# Patient Record
Sex: Male | Born: 1955 | Hispanic: Yes | Marital: Single | Smoking: Never smoker
Health system: Southern US, Community
[De-identification: ages and names within clinical notes are randomized; demographics above are authoritative.]

## PROBLEM LIST (undated history)

## (undated) DIAGNOSIS — I469 Cardiac arrest, cause unspecified: Secondary | ICD-10-CM

## (undated) DIAGNOSIS — Z955 Presence of coronary angioplasty implant and graft: Secondary | ICD-10-CM

## (undated) DIAGNOSIS — I509 Heart failure, unspecified: Secondary | ICD-10-CM

## (undated) DIAGNOSIS — E119 Type 2 diabetes mellitus without complications: Secondary | ICD-10-CM

## (undated) DIAGNOSIS — I2510 Atherosclerotic heart disease of native coronary artery without angina pectoris: Secondary | ICD-10-CM

## (undated) HISTORY — DX: Presence of coronary angioplasty implant and graft: Z95.5

## (undated) HISTORY — PX: CARDIAC CATHETERIZATION: SHX172

## (undated) HISTORY — DX: Type 2 diabetes mellitus without complications: E11.9

## (undated) HISTORY — DX: Cardiac arrest, cause unspecified: I46.9

## (undated) HISTORY — DX: Atherosclerotic heart disease of native coronary artery without angina pectoris: I25.10

## (undated) HISTORY — DX: Heart failure, unspecified: I50.9

## (undated) HISTORY — PX: ICD IMPLANT: EP1208

---

## 2021-06-24 ENCOUNTER — Other Ambulatory Visit: Payer: Self-pay | Admitting: Critical Care Medicine

## 2021-06-24 ENCOUNTER — Other Ambulatory Visit (HOSPITAL_COMMUNITY): Payer: Self-pay

## 2021-06-24 MED ORDER — METFORMIN HCL 500 MG PO TABS
500.0000 mg | ORAL_TABLET | Freq: Every day | ORAL | 0 refills | Status: DC
  Filled 2021-06-24: qty 30, 30d supply, fill #0

## 2021-06-24 MED ORDER — ATORVASTATIN CALCIUM 80 MG PO TABS
80.0000 mg | ORAL_TABLET | Freq: Every day | ORAL | 0 refills | Status: DC
  Filled 2021-06-24: qty 30, 30d supply, fill #0

## 2021-06-24 NOTE — Progress Notes (Signed)
One month refill sent.

## 2021-06-24 NOTE — Congregational Nurse Program (Signed)
  Dept: [phone]   Congregational Nurse Program Note  Date of Encounter: 06/24/2021  Past Medical History: No past medical history on file.  Encounter Details:  CNP Questionnaire - 06/24/21 1506       Questionnaire   Do you give verbal consent to treat you today? Yes    Visit Setting Church or Organization    Location Patient Served At [employer]    Patient Status Immigrant    Medical Provider No    Insurance Uninsured (Includes Orange Card/[facility] [place])    Intervention Refer;Assess (including screenings)    Transportation Need transportation assistance    Medication Have medication insecurities    Referrals PCP - [place]            Assisted patient to set up an appt at [facility] to establish care. Appt on oct 4th at 1:30pm. Patient needed a refill on two medications given at [facility] on 05/22/21. Patient ran out of atorvastatin 80 mg once a day. And Metformin 500 mg once a day.

## 2021-06-25 ENCOUNTER — Other Ambulatory Visit (HOSPITAL_COMMUNITY): Payer: Self-pay

## 2021-07-22 ENCOUNTER — Ambulatory Visit (INDEPENDENT_AMBULATORY_CARE_PROVIDER_SITE_OTHER): Payer: Self-pay | Admitting: Primary Care

## 2021-07-22 ENCOUNTER — Other Ambulatory Visit: Payer: Self-pay

## 2021-07-22 ENCOUNTER — Encounter (INDEPENDENT_AMBULATORY_CARE_PROVIDER_SITE_OTHER): Payer: Self-pay | Admitting: Primary Care

## 2021-07-22 VITALS — BP 147/85 | HR 60 | Temp 97.7°F | Wt 143.6 lb

## 2021-07-22 DIAGNOSIS — I10 Essential (primary) hypertension: Secondary | ICD-10-CM

## 2021-07-22 DIAGNOSIS — Z23 Encounter for immunization: Secondary | ICD-10-CM

## 2021-07-22 DIAGNOSIS — E1169 Type 2 diabetes mellitus with other specified complication: Secondary | ICD-10-CM | POA: Insufficient documentation

## 2021-07-22 DIAGNOSIS — I5042 Chronic combined systolic (congestive) and diastolic (congestive) heart failure: Secondary | ICD-10-CM | POA: Insufficient documentation

## 2021-07-22 DIAGNOSIS — E1159 Type 2 diabetes mellitus with other circulatory complications: Secondary | ICD-10-CM | POA: Insufficient documentation

## 2021-07-22 DIAGNOSIS — E119 Type 2 diabetes mellitus without complications: Secondary | ICD-10-CM

## 2021-07-22 DIAGNOSIS — E876 Hypokalemia: Secondary | ICD-10-CM

## 2021-07-22 DIAGNOSIS — E785 Hyperlipidemia, unspecified: Secondary | ICD-10-CM

## 2021-07-22 DIAGNOSIS — Z09 Encounter for follow-up examination after completed treatment for conditions other than malignant neoplasm: Secondary | ICD-10-CM

## 2021-07-22 DIAGNOSIS — I5022 Chronic systolic (congestive) heart failure: Secondary | ICD-10-CM | POA: Insufficient documentation

## 2021-07-22 DIAGNOSIS — I509 Heart failure, unspecified: Secondary | ICD-10-CM

## 2021-07-22 LAB — POCT GLYCOSYLATED HEMOGLOBIN (HGB A1C): Hemoglobin A1C: 7.5 % — AB (ref 4.0–5.6)

## 2021-07-22 MED ORDER — LISINOPRIL 20 MG PO TABS
20.0000 mg | ORAL_TABLET | Freq: Every day | ORAL | 0 refills | Status: DC
Start: 2021-07-22 — End: 2021-10-22
  Filled 2021-07-22: qty 30, 30d supply, fill #0

## 2021-07-22 MED ORDER — METFORMIN HCL ER 500 MG PO TB24
500.0000 mg | ORAL_TABLET | Freq: Every day | ORAL | 1 refills | Status: DC
  Filled 2021-07-22: qty 30, 30d supply, fill #0
  Filled 2021-09-08: qty 30, 30d supply, fill #1

## 2021-07-22 MED ORDER — POTASSIUM CHLORIDE CRYS ER 20 MEQ PO TBCR
20.0000 meq | EXTENDED_RELEASE_TABLET | Freq: Every day | ORAL | 0 refills | Status: DC
  Filled 2021-07-22: qty 30, 30d supply, fill #0

## 2021-07-22 MED ORDER — ATORVASTATIN CALCIUM 80 MG PO TABS
80.0000 mg | ORAL_TABLET | Freq: Every day | ORAL | 0 refills | Status: DC
  Filled 2021-07-22: qty 30, 30d supply, fill #0

## 2021-07-22 MED ORDER — METOPROLOL SUCCINATE ER 50 MG PO TB24
50.0000 mg | ORAL_TABLET | Freq: Every day | ORAL | 1 refills | Status: DC
Start: 2021-07-22 — End: 2021-10-22
  Filled 2021-07-22: qty 30, 30d supply, fill #0

## 2021-07-22 MED ORDER — EMPAGLIFLOZIN 10 MG PO TABS
10.0000 mg | ORAL_TABLET | Freq: Every day | ORAL | 1 refills | Status: DC
  Filled 2021-07-22: qty 30, 30d supply, fill #0

## 2021-07-22 MED ORDER — FUROSEMIDE 80 MG PO TABS
80.0000 mg | ORAL_TABLET | Freq: Every day | ORAL | 0 refills | Status: DC
Start: 2021-07-22 — End: 2021-10-22
  Filled 2021-07-22: qty 30, 30d supply, fill #0

## 2021-07-22 NOTE — Patient Instructions (Signed)
Vacuna Tdap (ttanos, difteria y tos ferina): lo que debe saber Tdap (Tetanus, Diphtheria, Pertussis) Vaccine: What You Need to Know 1. Por qu vacunarse? La vacuna Tdap puede prevenir el ttanos, la difteria y la tos ferina. La difteria y la tos ferina se contagian de persona a persona. El ttanos ingresa al organismo a travs de cortes o heridas. El TTANOS (T) provoca rigidez dolorosa en los msculos. El ttanos puede causar graves problemas de salud, como no poder abrir la boca, tener dificultad para tragar y respirar, o la muerte. La DIFTERIA (D) puede causar dificultad para respirar, insuficiencia cardaca, parlisis o muerte. La TOS FERINA (aP), tambin conocida como "tos convulsa", puede causar tos violenta e incontrolable lo que hace difcil respirar, comer o beber. La tos ferina puede ser muy grave, especialmente en los bebs y en los nios pequeos, y causar neumona, convulsiones, dao cerebral o la muerte. En adolescentes y adultos, puede causar prdida de peso, prdida del control de la vejiga, desmayos y fracturas de costillas al toser de manera intensa. 2. Vacuna Tdap La vacuna Tdap es solo para nios de 7 aos en adelante, adolescentes y adultos.  Los adolescentes deben recibir una dosis nica de la vacuna Tdap, preferentemente a los 11 o 12 aos. Las personas embarazadas deben recibir una dosis de la vacuna Tdap durante cada embarazo, preferiblemente durante la primera parte del tercer trimestre, para ayudar a proteger al recin nacido de la tos ferina. Los bebs tienen mayor riesgo de sufrir complicaciones graves y potencialmente mortales debido a la tos ferina. Los adultos que nunca recibieron la vacuna Tdap deben recibir una dosis. Adems, los adultos deben recibir una dosis de refuerzo de Tdap o Td (una vacuna diferente que protege contra el ttanos y la difteria, pero no contra la tos ferina) cada 10 aos, o despus de 5 aos en caso de herida o quemadura grave o sucia. La  vacuna Tdap puede ser administrada al mismo tiempo que otras vacunas. 3. Hable con el mdico Comunquese con la persona que le coloca las vacunas si la persona que la recibe: Ha tenido una reaccin alrgica despus de una dosis anterior de cualquier vacuna contra el ttanos, la difteria o la tos ferina, o cualquier alergia grave, potencialmente mortal Ha tenido un coma, disminucin del nivel de la conciencia o convulsiones prolongadas dentro de los 7 das posteriores a una dosis anterior de cualquier vacuna contra la tos ferina (DTP, DTaP o Tdap) Tiene convulsiones u otro problema del sistema nervioso Alguna vez tuvo sndrome de Guillain-Barr (tambin llamado "SGB") Ha tenido dolor intenso o hinchazn despus de una dosis anterior de cualquier vacuna contra el ttanos o la difteria En algunos casos, es posible que el mdico decida posponer la aplicacin de la vacuna Tdap para una visita en el futuro. Las personas que sufren trastornos menores, como un resfro, pueden vacunarse. Las personas que tienen enfermedades moderadas o graves generalmente deben esperar hasta recuperarse para poder recibir la vacuna Tdap.  Su mdico puede darle ms informacin. 4. Riesgos de una reaccin a la vacuna Despus de recibir la vacuna Tdap a veces se puede tener dolor, enrojecimiento o hinchazn en el lugar donde se aplic la inyeccin, fiebre leve, dolor de cabeza, sensacin de cansancio y nuseas, vmitos, diarrea o dolor de estmago. Las personas a veces se desmayan despus de procedimientos mdicos, incluida la vacunacin. Informe al mdico si se siente mareado, tiene cambios en la visin o zumbidos en los odos.  Al igual que con cualquier medicamento,   existe una probabilidad muy remota de que una vacuna cause una reaccin alrgica grave, otra lesin grave o la muerte. 5. Qu pasa si se presenta un problema grave? Podra producirse una reaccin alrgica despus de que la persona vacunada abandone la clnica. Si  observa signos de Runner, broadcasting/film/video grave (ronchas, hinchazn de la cara y la garganta, dificultad para respirar, latidos cardacos acelerados, mareos o debilidad), llame al 9-1-1 y lleve a la persona al hospital ms cercano. Si se presentan otros signos que le preocupan, comunquese con su mdico.  Las reacciones adversas deben informarse al Sistema de Informe de Eventos Adversos de Administrator, arts (Vaccine Adverse Event Reporting System, VAERS). Por lo general, el mdico presenta este informe o puede hacerlo usted mismo. Visite el sitio web del VAERS en www.vaers.LAgents.no o llame al 9562838624. El VAERS es solo para Biomedical engineer, y los miembros de su personal no proporcionan asesoramiento mdico. 6. Programa Nacional de Compensacin de Daos por American Electric Power El SunTrust de Compensacin de Daos por Administrator, arts (National Vaccine Injury Kohl's, Cabin crew) es un programa federal que fue creado para Patent examiner a las personas que puedan haber sufrido daos al recibir ciertas vacunas. Las Investment banker, operational a presuntas lesiones o muerte debidas a la vacunacin tienen un lmite de tiempo para su presentacin, que puede ser de tan solo Lexmark International. Visite el sitio web del VICP en SpiritualWord.at o llame al 1-908 591 9516 para obtener ms informacin acerca del programa y de cmo presentar un reclamo. 7. Cmo puedo obtener ms informacin? Pregntele a su mdico. Comunquese con el servicio de salud de su localidad o su estado. Visite el sitio web de Chief Technology Officer, Therapist, music) (Administracin de Alimentos y Media planner) para ver los prospectos de las vacunas e informacin adicional en FinderList.no. Comunquese con Building control surveyor for Continental Airlines, CDC (Centros para el Control y la Prevencin de [place]): Llame al [phone] (1-800-CDC-INFO) o Visite el sitio web de ALLTEL Corporation en PicCapture.uy. Declaracin  de informacin de la vacuna Tdap (ttanos, difteria y Kalman Shan) (05/24/2020) Esta informacin no tiene Theme park manager el consejo del mdico. Asegrese de hacerle al mdico cualquier pregunta que tenga. Document Revised: 07/23/2020 Document Reviewed: 07/08/2020 Elsevier Patient Education  2022 Elsevier Inc. Gripe en los adultos Influenza, Adult A la gripe tambin se la conoce como "influenza". Es una Advance Auto , la nariz y la garganta (vas respiratorias). Se transmite fcilmente de persona a persona (es contagiosa). La gripe causa sntomas que son Lubrizol Corporation de un resfro, junto con fiebre alta y dolores corporales. Cules son las causas? La causa de esta afeccin es el virus de la influenza. Puede contraer el virus de las siguientes maneras: Al inhalar gotitas que quedan en el aire despus de que una persona infectada con gripe tosi o estornud. Al tocar algo que est contaminado con el virus y [employer] mano a la boca, la nariz o los ojos. Qu incrementa el riesgo? Hay ciertas cosas que lo pueden hacer ms propenso a Warden/ranger. Estas incluyen lo siguiente: No lavarse las manos con frecuencia. Tener contacto cercano con Yahoo durante la temporada de resfro y gripe. Tocarse la boca, los ojos o la nariz sin antes lavarse las manos. No recibir la Teachers Insurance and Annuity Association. Puede correr un mayor riesgo de [place] gripe, y [place] graves, como una infeccin pulmonar (neumona), si usted: Es mayor de 65 aos de edad. Est embarazada. Tiene debilitado el sistema que combate las defensas (sistema inmunitario)  debido a una enfermedad o a que toma determinados medicamentos. Tiene una afeccin a largo plazo (crnica), como las siguientes: Enfermedad cardaca, renal o pulmonar. Diabetes. Asma. Tiene un trastorno heptico. Tiene mucho sobrepeso (obesidad [place]). Tiene anemia. Cules son los signos o sntomas? Los sntomas normalmente comienzan  de repente y Armando Reichert [address] y [address] Pueden incluir los siguientes: Grant Ruts y escalofros. Dolores de [place], dolores en el cuerpo o dolores musculares. Dolor de Advertising copywriter. Tos. Secrecin o congestin nasal. Molestias en el pecho. No querer comer tanto como lo hace normalmente. Sensacin de debilidad o cansancio. Mareos. Malestar estomacal o vmitos. Cmo se trata? Si la gripe se detecta de forma temprana, puede recibir tratamiento con medicamentos antivirales. Esto puede ayudar a reducir la gravedad y la duracin de la enfermedad. Se los administrarn por boca o a travs de un tubo (catter) intravenoso. Cuidarse en su hogar puede ayudar a que mejoren los sntomas. El mdico puede recomendarle lo siguiente: Tomar medicamentos de Sales promotion account executive. Beber abundante lquido. La gripe suele desaparecer sola. Si tiene sntomas muy graves u otros problemas, puede recibir tratamiento en un hospital. Siga estas instrucciones en su casa:   Actividad Descanse todo lo que sea necesario. Duerma lo suficiente. Lanny Hurst en su casa y no concurra al Aleen Campi o a la escuela, como se lo haya indicado el mdico. No salga de su casa hasta que no haya tenido fiebre por 24 horas sin tomar medicamentos. Salga de su casa solamente para ir al American Express. Comida y bebida Beverely Risen SRO (solucin de rehidratacin oral). Es Neomia Dear bebida que se vende en farmacias y tiendas. Beba suficiente lquido como para Pharmacologist la orina de color amarillo plido. En la medida en que pueda, beba lquidos transparentes en pequeas cantidades. Los lquidos transparentes son, por ejemplo: Westley Hummer. Trocitos de hielo. Jugo de frutas mezclado con agua. Bebidas deportivas de bajas caloras. Coma alimentos suaves que sean fciles de digerir. En la medida que pueda, consuma pequeas cantidades. Estos alimentos incluyen: Bananas. Pur de Praxair. Arroz. Carnes magras. Tostadas. Galletas. No coma ni beba lo siguiente: Lquidos con alto contenido  de azcar o cafena. Alcohol. Alimentos condimentados o con alto contenido de [place] Indicaciones generales Use los medicamentos de venta libre y los recetados solamente como se lo haya indicado el mdico. Use un humidificador de aire fro para que el aire de su casa est ms hmedo. Esto puede facilitar la respiracin. Cuando utilice un humidificador de vapor fro, lmpielo a diario. Vace el agua y [place] por agua limpia. Al toser o estornudar, cbrase la boca y la [place]. Lvese las manos frecuentemente con agua y [place] y durante al menos 20 segundos. Esto tambin es importante despus de toser o Engineering geologist. Si no dispone de [place] y [place], use desinfectante para manos con alcohol. Cumpla con todas las visitas de seguimiento. Cmo se previene?  Colquese la vacuna antigripal todos los [place]. Puede colocarse la vacuna contra la gripe a fines de verano, en otoo o en invierno. Pregntele al mdico cundo debe aplicarse la vacuna contra la gripe. Evite el contacto con personas que estn enfermas durante el otoo y el invierno. Es la temporada del resfro y Emergency planning/management officer. Comunquese con un mdico si: Tiene sntomas nuevos. Tiene los siguientes sntomas: Dolor de [place]. Materia fecal lquida (diarrea). Grant Ruts. La tos empeora. Empieza a tener ms mucosidad. Tiene Programme researcher, broadcasting/film/video. Vomita. Solicite ayuda de inmediato si: Le falta el aire. Tiene dificultad para respirar. La piel o las uas se ponen de un color azulado.  Presenta dolor muy intenso o rigidez en el cuello. Tiene dolor de cabeza repentino. Le duele la cara o el odo de forma repentina. No puede comer ni beber sin vomitar. Estos sntomas pueden representar un problema grave que constituye Radio broadcast assistant. Solicite atencin mdica de inmediato. Comunquese con el servicio de emergencias de su localidad (911 en los [place]). No espere a ver si los sntomas desaparecen. No conduzca por sus propios medios Dillard's. Resumen A la gripe tambin se la conoce como "influenza". Es una Advance Auto , la nariz y Administrator. Se transmite fcilmente de [place] persona a otra. Use los medicamentos de venta libre y los recetados solamente como se lo haya indicado el mdico. Aplicarse la vacuna contra la gripe todos los aos es la mejor manera de no contagiarse la gripe. Esta informacin no tiene Theme park manager el consejo del mdico. Asegrese de hacerle al mdico cualquier pregunta que tenga. Document Revised: 08/01/2020 Document Reviewed: 08/01/2020 Elsevier Patient Education  2022 ArvinMeritor.

## 2021-07-22 NOTE — Progress Notes (Signed)
[facility]   Subjective:   [name] is a 65 y.o. Hispanic male(interpreter Costella Hatcher [phone]  presents for hospital follow up and establish care.  He was hospitalized [place] date 05/14/21   , patient was discharged from the hospital on  05/22/21  , patient was admitted for: Heart failure.Unable to access Care every where d/c papers are in [place]. Denies shortness of breath, headaches, chest pain or lower extremity edema   No past medical history on file.   No Known Allergies    Current Outpatient Medications on File Prior to Visit  Medication Sig Dispense Refill   atorvastatin (LIPITOR) 80 MG tablet Take 1 tablet (80 mg total) by mouth daily. 30 tablet 0   clopidogrel (PLAVIX) 75 MG tablet Take 75 mg by mouth daily.     furosemide (LASIX) 80 MG tablet Take 80 mg by mouth daily.     lisinopril (ZESTRIL) 20 MG tablet Take 20 mg by mouth daily.     metFORMIN (GLUCOPHAGE) 500 MG tablet Take 1 tablet (500 mg total) by mouth daily with breakfast. 30 tablet 0   metoprolol succinate (TOPROL-XL) 50 MG 24 hr tablet Take 50 mg by mouth daily.     potassium chloride SA (KLOR-CON) 20 MEQ tablet Take 20 mEq by mouth daily.     No current facility-administered medications on file prior to visit.   Review of System: Review of Systems  Eyes:  Positive for blurred vision.  Neurological:  Positive for dizziness.  All other systems reviewed and are negative.  Objective:  BP (!) 147/85 (BP Location: Right Arm, Patient Position: Sitting, Cuff Size: Normal)   Pulse 60   Temp 97.7 F (36.5 C) (Temporal)   Wt 143 lb 9.6 oz (65.1 kg)   SpO2 99%   Filed Weights   07/22/21 1340  Weight: 143 lb 9.6 oz (65.1 kg)   Physical Exam: General Appearance: Well nourished, in no apparent distress. Eyes: PERRLA, EOMs, conjunctiva no swelling or erythema Sinuses: No Frontal/maxillary tenderness ENT/Mouth: Ext aud canals clear, TMs without erythema, bulging. No erythema,  swelling, or exudate on post pharynx.  Tonsils not swollen or erythematous. Hearing normal.  Neck: Supple, thyroid normal.  Respiratory: Respiratory effort normal, BS equal bilaterally without rales, rhonchi, wheezing or stridor.  Cardio: RRR with no MRGs. Brisk peripheral pulses without edema.  Abdomen: Soft, + BS.  Non tender, no guarding, rebound, hernias, masses. Lymphatics: Non tender without lymphadenopathy.  Musculoskeletal: Full ROM, 5/5 strength, normal gait.  Skin: Warm, dry without rashes, lesions, ecchymosis.  Neuro: Cranial nerves intact. Normal muscle tone, no cerebellar symptoms. Sensation intact.  Psych: Awake and oriented X 3, normal affect, Insight and Judgment appropriate.    Assessment:  [name] was seen today for new patient (initial visit).  Diagnoses and all orders for this visit:  Type 2 diabetes mellitus without complication, without long-term current use of insulin (HCC) Goal of therapy: Less than 6.5 hemoglobin A1c.  Monitor foods that are high in carbohydrates are the following rice, potatoes, breads, sugars, and pastas.  Reduction in the intake (eating) will assist in lowering your blood sugars.  -     HgB A1c 7.5 -     CBC with Differential -     metFORMIN (GLUCOPHAGE XR) 500 MG 24 hr tablet; Take 1 tablet (500 mg total) by mouth daily with breakfast. -     empagliflozin (JARDIANCE) 10 MG TABS tablet; Take 1 tablet (10 mg total) by mouth daily  before breakfast.  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  Unable to review hospital notes from care everywhere.  Nurse called [facility] today for information for PCP to evaluate and treat effectively.  Essential hypertension Counseled on blood pressure goal of less than 130/80, low-sodium, DASH diet, medication compliance, 150 minutes of moderate intensity exercise per week. Discussed  medication compliance, adverse effects.  -     furosemide (LASIX) 80 MG tablet; Take 1 tablet (80 mg total) by mouth daily. -     lisinopril (ZESTRIL) 20 MG tablet; Take 1 tablet (20 mg total) by mouth daily. -     metoprolol succinate (TOPROL-XL) 50 MG 24 hr tablet; Take 1 tablet (50 mg total) by mouth daily. -     Ambulatory referral to Cardiology  Acute congestive heart failure, unspecified heart failure type [facility]) Consulted with cardiologist reviewed medications that he was discharged on as if any other labs needed to be done since unable to review labs done in the hospital.  CBC CMP lipid and add a TSH.  Patient states he thinks he has a hospital follow-up with cardiologist.  Unable to verify information therefore will refer to cardiologist locally.  Patient does live in [place]. -     empagliflozin (JARDIANCE) 10 MG TABS tablet; Take 1 tablet (10 mg total) by mouth daily before breakfast. -     atorvastatin (LIPITOR) 80 MG tablet; Take 1 tablet (80 mg total) by mouth daily. -     furosemide (LASIX) 80 MG tablet; Take 1 tablet (80 mg total) by mouth daily. -     TSH; Future  Hypokalemia -     CMP14+EGFR -     potassium chloride SA (KLOR-CON) 20 MEQ tablet; Take 1 tablet (20 mEq total) by mouth daily.  Congestive heart failure, unspecified HF chronicity, unspecified heart failure type (HCC) -     metoprolol succinate (TOPROL-XL) 50 MG 24 hr tablet; Take 1 tablet (50 mg total) by mouth daily. -     Ambulatory referral to Cardiology  Hyperlipidemia, unspecified hyperlipidemia type  Healthy lifestyle diet of fruits vegetables fish nuts whole grains and low saturated fat . Foods high in cholesterol or liver, fatty meats,cheese, butter avocados, nuts and seeds, chocolate and fried foods.  -     Lipid Panel -     atorvastatin (LIPITOR) 80 MG tablet; Take 1 tablet (80 mg total) by mouth daily.  Need for immunization against influenza -     Flu Vaccine QUAD 4moIM (Fluarix, Fluzone &  Alfiuria Quad PF)  Other orders -     TSH -     Specimen status report      This note has been created with DSurveyor, quantity Any transcriptional errors are unintentional.   MKerin Perna NP 07/22/2021, 1:49 PM

## 2021-07-23 ENCOUNTER — Other Ambulatory Visit: Payer: Self-pay

## 2021-07-23 LAB — CMP14+EGFR
ALT: 140 IU/L — ABNORMAL HIGH (ref 0–44)
AST: 74 IU/L — ABNORMAL HIGH (ref 0–40)
Albumin/Globulin Ratio: 1.5 (ref 1.2–2.2)
Albumin: 4.4 g/dL (ref 3.8–4.8)
Alkaline Phosphatase: 183 IU/L — ABNORMAL HIGH (ref 44–121)
BUN/Creatinine Ratio: 46 — ABNORMAL HIGH (ref 10–24)
BUN: 71 mg/dL — ABNORMAL HIGH (ref 8–27)
Bilirubin Total: 0.4 mg/dL (ref 0.0–1.2)
CO2: 21 mmol/L (ref 20–29)
Calcium: 9.2 mg/dL (ref 8.6–10.2)
Chloride: 99 mmol/L (ref 96–106)
Creatinine, Ser: 1.54 mg/dL — ABNORMAL HIGH (ref 0.76–1.27)
Globulin, Total: 2.9 g/dL (ref 1.5–4.5)
Glucose: 178 mg/dL — ABNORMAL HIGH (ref 70–99)
Potassium: 5.7 mmol/L — ABNORMAL HIGH (ref 3.5–5.2)
Sodium: 136 mmol/L (ref 134–144)
Total Protein: 7.3 g/dL (ref 6.0–8.5)
eGFR: 50 mL/min/{1.73_m2} — ABNORMAL LOW (ref >59)

## 2021-07-23 LAB — CBC WITH DIFFERENTIAL/PLATELET
Basophils Absolute: 0.1 10*3/uL (ref 0.0–0.2)
Basos: 1 %
EOS (ABSOLUTE): 2.1 10*3/uL — ABNORMAL HIGH (ref 0.0–0.4)
Eos: 21 %
Hematocrit: 32.7 % — ABNORMAL LOW (ref 37.5–51.0)
Hemoglobin: 11 g/dL — ABNORMAL LOW (ref 13.0–17.7)
Immature Grans (Abs): 0 10*3/uL (ref 0.0–0.1)
Immature Granulocytes: 0 %
Lymphocytes Absolute: 1.7 10*3/uL (ref 0.7–3.1)
Lymphs: 17 %
MCH: 29.1 pg (ref 26.6–33.0)
MCHC: 33.6 g/dL (ref 31.5–35.7)
MCV: 87 fL (ref 79–97)
Monocytes Absolute: 0.7 10*3/uL (ref 0.1–0.9)
Monocytes: 7 %
Neutrophils Absolute: 5.3 10*3/uL (ref 1.4–7.0)
Neutrophils: 54 %
Platelets: 171 10*3/uL (ref 150–450)
RBC: 3.78 x10E6/uL — ABNORMAL LOW (ref 4.14–5.80)
RDW: 14.5 % (ref 11.6–15.4)
WBC: 9.9 10*3/uL (ref 3.4–10.8)

## 2021-07-23 LAB — LIPID PANEL
Chol/HDL Ratio: 3.1 ratio (ref 0.0–5.0)
Cholesterol, Total: 80 mg/dL — ABNORMAL LOW (ref 100–199)
HDL: 26 mg/dL — ABNORMAL LOW (ref >39)
LDL Chol Calc (NIH): 27 mg/dL (ref 0–99)
Triglycerides: 157 mg/dL — ABNORMAL HIGH (ref 0–149)
VLDL Cholesterol Cal: 27 mg/dL (ref 5–40)

## 2021-07-30 ENCOUNTER — Telehealth (INDEPENDENT_AMBULATORY_CARE_PROVIDER_SITE_OTHER): Payer: Self-pay

## 2021-07-30 LAB — SPECIMEN STATUS REPORT

## 2021-07-30 LAB — TSH: TSH: 1.61 u[IU]/mL (ref 0.450–4.500)

## 2021-07-30 NOTE — Telephone Encounter (Signed)
-----  Message from Gildardo Pounds, NP sent at 07/24/2021 10:56 AM EDT ----- Will need to return to lab fasting in 7 days to repeat CMP. Elevated cholesterol, liver enzymes, potassium and creatinine. A1c 7.5 continue current meds prescribed by PCP.  Needs stool kit and follow up on CBC by PCP for anemia in male patient.

## 2021-07-30 NOTE — Telephone Encounter (Signed)
Contacted patient with assistance of pacific interpreter [phone]) patient was unavailable. Per DPR spoke with his sister [name]. After providing results. Sister informed CMA that patient decided to move back to [place]. Maryjean Morn, CMA

## 2021-09-01 NOTE — Progress Notes (Signed)
Cardiology Office Note:    Date:  09/02/2021   ID:  Pedro, Kemp 07-24-1956, MRN [id number]  PCP:  Pedro Sessions, NP   [facility] Providers Cardiologist:  Maisie Fus, MD     Referring MD: Pedro Sessions, NP   No chief complaint on file. Post Cardiac Arrest  History of Present Illness:    Pedro Kemp is a 65 y.o. male with a hx of T2DM, HLD  spanish speaking requires an interpreter, referral for post hospitalization at [facility] for ischemic VT and cardiac arrest (interpretor Kandis Mannan #[id number])   He was admitted at [facility] 05/14/2021 to 05/22/2021.[name] presented to [facility] with chest pain, noted to have high risk NSTEMI and acute CHF with EF 35%. He had troponin up to 15 thousand. He had cath on 7/27 that showed 100% occluded mid LAD s/p shockwave and PCI. He also had disease in his diag s/p PCI. He subsequently had VT arrest 7/29 with ACLS and ROSC. He went back to the cath lab on 7/29 and stent was patent. This was concerning for scar mediated VT.  He had an EP study on 8/2 with inducible sustained monomorphic VT.  Managed with amiodarone.He underwent DC-ICD.  He had a moderate sized hematoma at the ICD site requiring monitoring.  He was also managed for acute respiratory failure 2/2 CHF exacerbation. Crt on discharge was 1.38. A1c was 9 %. He was discharged on asa 81 mg daily,  atorvastatin 80 mg daily, plavix 75 mg daily, lisinopril 20 mg daily, lasix 80 mg daily, metop XL 50 mg daily. Started on Jardiance 10 mg. His A1c is 7.5 %   He had appointments for cardiac rehab, with cardiology at [facility]: Dr Merrily Pew. ICD check with Dr. Lanae Boast in August.   Today, he states that he did not do exercise afterwards. After he left the hospital, he had his ICD checked. He would like to have his care here. He reports no shocks. He has no swelling or pain at the site. He denies exertional chest pain or dyspnea. He denies orthopnea, PND, no LE edema. During the day , he does some work  around the house.  Prior to his hospitalization , he had no problems with the heart. He does not smoke.  He denies palpitations, LH. He gets some dizziness. His vision gets blurry. He has no hx of syncope. He is not working will need medication assistance.  Cardiology Studies  05/14/2021-Cath - RFA. 100% occluded LAD s/p Synergy 2.75 x 38 mm DES with shockwave to the LAD. 90% D1 s/p 2.5 x 12 synergy DES  Echo- EF 35%, mild MR, PASP 25 mmHg  S/p DC Medtronic ICD 05/20/2021 AAI-DDD VF zone > 182 bpm ATP x3, 40J x6 VT off Base rate 60 bpm   Past Medical History:  Diagnosis Date   Cardiac arrest (HCC)    CHF (congestive heart failure) (HCC)    Coronary artery disease    Diabetes mellitus without complication (HCC)    H/O heart artery stent    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    Past Surgical History:  Procedure Laterality Date   CARDIAC CATHETERIZATION     ICD IMPLANT      Current Medications: Current Meds  Medication Sig   aspirin EC 81 MG tablet Take 81 mg by mouth daily. Swallow whole.   atorvastatin (LIPITOR) 80 MG tablet Take 1 tablet (80 mg total) by mouth  daily.   clopidogrel (PLAVIX) 75 MG tablet Take 75 mg by mouth daily.   empagliflozin (JARDIANCE) 10 MG TABS tablet Take 1 tablet (10 mg total) by mouth daily before breakfast.   furosemide (LASIX) 80 MG tablet Take 1 tablet (80 mg total) by mouth daily.   lisinopril (ZESTRIL) 20 MG tablet Take 1 tablet (20 mg total) by mouth daily.   metFORMIN (GLUCOPHAGE XR) 500 MG 24 hr tablet Take 1 tablet (500 mg total) by mouth daily with breakfast.   metoprolol succinate (TOPROL-XL) 50 MG 24 hr tablet Take 1 tablet (50 mg total) by mouth daily.   potassium chloride SA (KLOR-CON) 20 MEQ tablet Take 1 tablet (20 mEq total) by mouth daily.     Allergies:   Patient has no known allergies.   Social History   Socioeconomic History   Marital status: Single    Spouse name: Not on file   Number  of children: Not on file   Years of education: Not on file   Highest education level: Not on file  Occupational History   Not on file  Tobacco Use   Smoking status: Never    Passive exposure: Never   Smokeless tobacco: Never  Substance and Sexual Activity   Alcohol use: Never   Drug use: Never   Sexual activity: Not Currently    Partners: Female  Other Topics Concern   Not on file  Social History Narrative   Not on file   Social Determinants of Health   Financial Resource Strain: Not on file  Food Insecurity: Not on file  Transportation Needs: Not on file  Physical Activity: Not on file  Stress: Not on file  Social Connections: Not on file     Family History: The patient's family history includes Diabetes in his brother and mother.  ROS:   Please see the history of present illness.     All other systems reviewed and are negative.  EKGs/Labs/Other Studies Reviewed:    The following studies were reviewed today:   EKG:  EKG is  ordered today.  The ekg ordered today demonstrates  NSR, LAFB, anterior Q waves  Recent Labs: 07/22/2021: ALT 140; BUN 71; Creatinine, Ser 1.54; Hemoglobin 11.0; Platelets 171; Potassium 5.7; Sodium 136; TSH 1.610  Recent Lipid Panel    Component Value Date/Time   CHOL 80 (L) 07/22/2021 1454   TRIG 157 (H) 07/22/2021 1454   HDL 26 (L) 07/22/2021 1454   CHOLHDL 3.1 07/22/2021 1454   LDLCALC 27 07/22/2021 1454     Risk Assessment/Calculations:           Physical Exam:    VS:  BP 128/68   Pulse 60   Ht 5\' 4"  (1.626 m)   Wt 144 lb 9.6 oz (65.6 kg)   BMI 24.82 kg/m     Wt Readings from Last 3 Encounters:  09/02/21 144 lb 9.6 oz (65.6 kg)  07/22/21 143 lb 9.6 oz (65.1 kg)  06/24/21 161 lb (73 kg)     GEN:  Well nourished, well developed in no acute distress HEENT: Normal NECK: No JVD; No carotid bruits LYMPHATICS: No lymphadenopathy CARDIAC: RRR, no murmurs, rubs, gallops RESPIRATORY:  Clear to auscultation without  rales, wheezing or rhonchi  ABDOMEN: Soft, non-tender, non-distended MUSCULOSKELETAL:  No edema; No deformity  SKIN: Warm and dry NEUROLOGIC:  Alert and oriented x 3 PSYCHIATRIC:  Normal affect   ASSESSMENT:    #Ischemic CM NYHA Class I : on GDMT ( AceI, BB,  SGLT2). He is euvolemic and asymptomatic.  on lasix 80 mg daily. If tolerating there Can incorporate spironolactone as long as he is doing well on his current regimen. Agree with DM2 management. He is not working and the challenge will be ensuring he can continue to get his medications moving forward. If having chest pain will include nitrate. - continue DAPT (at least until 05/14/2022) - continue GDMT (lisinopril 20 mg daily, metop XL 50 mg daily, Jardiance 10 mg daily) - continue atorvastatin 80 mg daily - continue lasix 80 mg daily  PLAN:    In order of problems listed above:  EP referral for device clinic management Transmission today  Follow up 3 months       Medication Adjustments/Labs and Tests Ordered: Current medicines are reviewed at length with the patient today.  Concerns regarding medicines are outlined above.    Signed, Maisie Fus, MD  09/02/2021 9:23 AM    [facility]

## 2021-09-02 ENCOUNTER — Telehealth: Payer: Self-pay

## 2021-09-02 ENCOUNTER — Ambulatory Visit (INDEPENDENT_AMBULATORY_CARE_PROVIDER_SITE_OTHER): Payer: Self-pay | Admitting: Internal Medicine

## 2021-09-02 ENCOUNTER — Encounter: Payer: Self-pay | Admitting: Internal Medicine

## 2021-09-02 ENCOUNTER — Other Ambulatory Visit: Payer: Self-pay

## 2021-09-02 VITALS — BP 128/68 | HR 60 | Ht 64.0 in | Wt 144.6 lb

## 2021-09-02 DIAGNOSIS — I509 Heart failure, unspecified: Secondary | ICD-10-CM

## 2021-09-02 DIAGNOSIS — Z95810 Presence of automatic (implantable) cardiac defibrillator: Secondary | ICD-10-CM

## 2021-09-02 NOTE — Progress Notes (Signed)
Asked by Dr. Wyline Mood to interrogate the patient's ICD.  He is transitioning care from [facility] to [facility].  He has a Medtronic Cobalt XT DR device (model number [id number], serial number [id number]).  The atrial lead is a Medtronic [id number], the ICD lead is a 6935M. This was implanted on August 12/08/2020 by Dr. Lanae Boast.    Device function is normal. All lead parameters are excellent and auto capture is turned on for both the atrial and ventricular lead.   The device has not detected any sustained or nonsustained ventricular tachycardia or atrial fibrillation and has not delivered any therapy since implantation. The device is programmed as a single zone "shock box" at rates over 182 bpm (3 sequences of ATP followed by 6 x 40 J shocks. Programmed AAI-DDD.    Estimated generator longevity 11.8 years. RA lead impedance 494 ohm, sensed P wave 1.4 mV, threshold 0.5 V at 0.4 ms RV lead impedance 380 ohms (high-voltage 51 ohms) sensed R waves 13.0 mV, pacing threshold 0.5 V at 0.4 ms  Note 85% atrial pacing and 0% ventricular pacing.  Heart rate histograms are slightly blunted.  No complaints of chronotropic incompetence.  Activity 3 hours a day and increasing.  Pedro Fair, MD, [facility] [phone] office [phone] pager   We will enroll him in remote device monitoring.

## 2021-09-02 NOTE — Telephone Encounter (Signed)
I called the [facility]-Dr. Lanae Boast office [phone]) and left a message for the patient to be released in [facility]. Dr. Rubie Maid will now be following the patient ICD.

## 2021-09-02 NOTE — Patient Instructions (Addendum)
Medication Instructions:  No Changes In Medications at this time.  *If you need a refill on your cardiac medications before your next appointment, please call your pharmacy*  Follow-Up: At [facility], you and your health needs are our priority.  As part of our continuing mission to provide you with exceptional heart care, we have created designated Provider Care Teams.  These Care Teams include your primary Cardiologist (physician) and Advanced Practice Providers (APPs -  Physician Assistants and Nurse Practitioners) who all work together to provide you with the care you need, when you need it.  We recommend signing up for the patient portal called "MyChart".  Sign up information is provided on this After Visit Summary.  MyChart is used to connect with patients for Virtual Visits (Telemedicine).  Patients are able to view lab/test results, encounter notes, upcoming appointments, etc.  Non-urgent messages can be sent to your provider as well.   To learn more about what you can do with MyChart, go to [email or web address].    Your next appointment:   3 month(s)  The format for your next appointment:   In Person  Provider:   Maisie Fus, MD    Other Instructions APPOINTMENT WITH DEVICE CLINIC- ELECTROPHYSIOLOGY

## 2021-09-02 NOTE — Addendum Note (Signed)
Addended by: Bea Laura B on: 09/02/2021 01:49 PM   Modules accepted: Orders

## 2021-09-02 NOTE — Progress Notes (Signed)
Received referral from [place], CMA that pt is uninsured. Chart review complete, may be eligible for financial assistance through [facility] and [employer]. Aware he is Spanish speaking. LCSW emailed the assistance applications to CMA to provide to pt and I will f/u with him next week to answer any additional questions/concerns.   Octavio Graves, MSW, LCSW [facility] Heart/Vascular Care Navigation  [phone]

## 2021-09-03 NOTE — Telephone Encounter (Signed)
Pt has been released and put on a remote schedule.

## 2021-09-08 ENCOUNTER — Other Ambulatory Visit: Payer: Self-pay

## 2021-09-10 ENCOUNTER — Other Ambulatory Visit: Payer: Self-pay

## 2021-09-18 ENCOUNTER — Other Ambulatory Visit: Payer: Self-pay

## 2021-09-18 ENCOUNTER — Telehealth: Payer: Self-pay | Admitting: Licensed Clinical Social Worker

## 2021-09-18 NOTE — Progress Notes (Signed)
Heart and Vascular Care Navigation  09/18/2021  Pedro Kemp [birth year] [id number]  Reason for Referral:    Engaged with patient by telephone for initial visit for Heart and Vascular Care Coordination.                                                                                                   Assessment:               LCSW attempted to reach pt this afternoon at listed number [phone]. Pt sister [name] answered, it is her number (DPR on file); w/ the assistance of [employer] [name] [phone]) I was able to introduce self, role, reason for call. Pt sister shares that pt lives w/ them, has not been working therefore she is not sure about all the documents needed for the applications provided. She confirms they do have them at home. LCSW provided clarification that pt would need to obtain notarized letter of support if no income. She is agreeable to assisting with that at this time. She is interested in an appointment with Financial Counseling to go over applications further, and for me to send the letter of support to her. She requests that information texted to her at the number above. Pt sister only had additional questions about medications called into [facility], I confirmed that medications were being sent there and will text her that number as well.                          HRT/VAS Care Coordination     Patients Home Cardiology Office [facility]   Outpatient Care Team Social Worker   Social Worker Name: [name], [place] [facility] [phone]   Living arrangements for the past 2 months Single Family Home   Lives with: Relatives; Siblings   Patient Current Insurance Coverage Self-Pay   Patient Has Concern With Paying Medical Bills Yes   Patient Concerns With Medical Bills ongoing medical care- no insurance   Medical Bill Referrals: CAFA given, appt w/ [name] made   Does Patient Have Prescription Coverage? No   Patient Prescription Assistance  Programs [employer] Card       Social History:                                                                             SDOH Screenings   Alcohol Screen: Not on file  Depression (PHQ2-9): Not on file  Financial Resource Strain: Medium Risk   Difficulty of Paying Living Expenses: Somewhat hard  Food Insecurity: Not on file  Housing: Not on file  Physical Activity: Not on file  Social Connections: Not on file  Stress: Not on file  Tobacco Use: Low Risk    Smoking Tobacco Use: Never   Smokeless Tobacco Use:  Never   Passive Exposure: Never  Transportation Needs: No Transportation Needs   Lack of Transportation (Medical): No   Lack of Transportation (Non-Medical): No    SDOH Interventions: Financial Resources:  Corporate treasurer Interventions: Artist, Other (Comment) (mailed Paediatric nurse Aid and [employer]) Surveyor, quantity Counseling for [employer] Program  Transportation:   Transportation Interventions: Intervention Not Indicated    Other Care Navigation Interventions:     Provided Pharmacy assistance resources [employer] Card   Follow-up plan:   LCSW will make appt w/ [name] for 2 weeks once that schedule opens back up. I have texted her the number for the [facility] pharmacy and that the letters have been mailed to them for completion. I will f/u once appt w/ [name] on schedule w/ date and time.

## 2021-09-22 ENCOUNTER — Telehealth: Payer: Self-pay | Admitting: Licensed Clinical Social Worker

## 2021-09-22 NOTE — Telephone Encounter (Signed)
LCSW sent message to pt sister [name], appreciate [place], assistance w/ scheduling appt w/ [name]. Appt is 12/16 at 9am. Pt sister confirms this was received. Address sent.   Octavio Graves, MSW, LCSW Clinical Social Worker II [facility] Navigation  [phone]- work cell phone (preferred) [phone]- desk phone

## 2021-10-03 ENCOUNTER — Other Ambulatory Visit: Payer: Self-pay

## 2021-10-03 ENCOUNTER — Ambulatory Visit: Payer: Self-pay | Attending: Primary Care

## 2021-10-06 ENCOUNTER — Telehealth: Payer: Self-pay | Admitting: Licensed Clinical Social Worker

## 2021-10-06 NOTE — Telephone Encounter (Signed)
LCSW able to confirm pt approved for [employer] and [employer] assistance through 03/2022; submitted his [employer] application w/ assistance of financial counselor [place] on 12/16. Pending processing of that application. Will make note of [employer] Card etc on upcoming appointments.   Octavio Graves, MSW, LCSW Clinical Social Worker II [facility] Navigation  [phone]- work cell phone (preferred) [phone]- desk phone

## 2021-10-22 ENCOUNTER — Other Ambulatory Visit: Payer: Self-pay

## 2021-10-22 ENCOUNTER — Encounter (INDEPENDENT_AMBULATORY_CARE_PROVIDER_SITE_OTHER): Payer: Self-pay | Admitting: Primary Care

## 2021-10-22 ENCOUNTER — Other Ambulatory Visit (HOSPITAL_COMMUNITY): Payer: Self-pay

## 2021-10-22 ENCOUNTER — Ambulatory Visit (INDEPENDENT_AMBULATORY_CARE_PROVIDER_SITE_OTHER): Payer: Self-pay | Admitting: Primary Care

## 2021-10-22 VITALS — BP 170/83 | HR 59 | Temp 97.5°F | Ht 64.0 in | Wt 143.8 lb

## 2021-10-22 DIAGNOSIS — E119 Type 2 diabetes mellitus without complications: Secondary | ICD-10-CM

## 2021-10-22 DIAGNOSIS — Z760 Encounter for issue of repeat prescription: Secondary | ICD-10-CM

## 2021-10-22 DIAGNOSIS — E782 Mixed hyperlipidemia: Secondary | ICD-10-CM

## 2021-10-22 DIAGNOSIS — I10 Essential (primary) hypertension: Secondary | ICD-10-CM

## 2021-10-22 DIAGNOSIS — Z23 Encounter for immunization: Secondary | ICD-10-CM

## 2021-10-22 DIAGNOSIS — I509 Heart failure, unspecified: Secondary | ICD-10-CM

## 2021-10-22 LAB — POCT GLYCOSYLATED HEMOGLOBIN (HGB A1C): Hemoglobin A1C: 7.2 % — AB (ref 4.0–5.6)

## 2021-10-22 MED ORDER — METOPROLOL SUCCINATE ER 50 MG PO TB24
50.0000 mg | ORAL_TABLET | Freq: Every day | ORAL | 1 refills | Status: DC
  Filled 2021-10-22 – 2021-12-05 (×3): qty 30, 30d supply, fill #0

## 2021-10-22 MED ORDER — EMPAGLIFLOZIN 10 MG PO TABS
10.0000 mg | ORAL_TABLET | Freq: Every day | ORAL | 1 refills | Status: DC
  Filled 2021-10-22 – 2021-12-05 (×2): qty 30, 30d supply, fill #0

## 2021-10-22 MED ORDER — LISINOPRIL 20 MG PO TABS
20.0000 mg | ORAL_TABLET | Freq: Every day | ORAL | 1 refills | Status: DC
  Filled 2021-10-22 – 2021-12-05 (×2): qty 30, 30d supply, fill #0

## 2021-10-22 MED ORDER — ATORVASTATIN CALCIUM 20 MG PO TABS
20.0000 mg | ORAL_TABLET | Freq: Every day | ORAL | 3 refills | Status: DC
  Filled 2021-10-22 – 2021-12-05 (×2): qty 30, 30d supply, fill #0

## 2021-10-22 MED ORDER — METFORMIN HCL ER 500 MG PO TB24
500.0000 mg | ORAL_TABLET | Freq: Every day | ORAL | 1 refills | Status: DC
Start: 2021-10-22 — End: 2022-01-07
  Filled 2021-10-22 – 2021-12-05 (×2): qty 30, 30d supply, fill #0

## 2021-10-22 NOTE — Progress Notes (Signed)
Subjective:  Patient ID: Pedro Kemp, male    DOB: May 18, 1956  Age: 66 y.o. MRN: [id number]  CC: No chief complaint on file.   HPI Pedro Kemp 66 year old Hispanic male (interpreter Jabier Mutton [phone]) presents for Follow-up of diabetes. Patient does check blood sugar at home. Management of HTN- he did not take his medication this morning and Bp is elevated.  Compliant with meds - Yes Checking CBGs? Yes  Fasting avg - 112-130  Postprandial average -  Exercising regularly? - Yes (walking) Watching carbohydrate intake? - Yes Neuropathy ? -yes Hypoglycemic events - No  - Recovers with :   Pertinent ROS:  Polyuria - No Polydipsia - No Vision problems - yes Medications as noted below. Taking them regularly without complication/adverse reaction being reported today.   History Pedro Kemp has a past medical history of Cardiac arrest ([place]), CHF (congestive heart failure) ([place]), Coronary artery disease, Diabetes mellitus without complication ([place]), and H/O heart artery stent.   He has a past surgical history that includes ICD IMPLANT and Cardiac catheterization.   His family history includes Diabetes in his brother and mother.He reports that he has never smoked. He has never been exposed to tobacco smoke. He has never used smokeless tobacco. He reports that he does not drink alcohol and does not use drugs.  Current Outpatient Medications on File Prior to Visit  Medication Sig Dispense Refill   clopidogrel (PLAVIX) 75 MG tablet Take 75 mg by mouth daily.     aspirin EC 81 MG tablet Take 81 mg by mouth daily. Swallow whole. (Patient not taking: Reported on 10/22/2021)     No current facility-administered medications on file prior to visit.    ROS Review of Systems  Eyes:  Positive for visual disturbance.  Neurological:  Positive for numbness.       Tingling bilateral feet  All other systems reviewed and are negative.  Objective:  BP (!) 170/83 (BP Location: Right Arm, Patient  Position: Sitting, Cuff Size: Normal)    Pulse (!) 59    Temp (!) 97.5 F (36.4 C) (Temporal)    Ht '5\' 4"'  (1.626 m)    Wt 143 lb 12.8 oz (65.2 kg)    SpO2 99%    BMI 24.68 kg/m   BP Readings from Last 3 Encounters:  10/22/21 (!) 170/83  09/02/21 128/68  07/22/21 (!) 147/85    Wt Readings from Last 3 Encounters:  10/22/21 143 lb 12.8 oz (65.2 kg)  09/02/21 144 lb 9.6 oz (65.6 kg)  07/22/21 143 lb 9.6 oz (65.1 kg)    Physical Exam  Lab Results  Component Value Date   HGBA1C 7.2 (A) 10/22/2021   HGBA1C 7.5 (A) 07/22/2021    Lab Results  Component Value Date   WBC 9.9 07/22/2021   HGB 11.0 (L) 07/22/2021   HCT 32.7 (L) 07/22/2021   PLT 171 07/22/2021   GLUCOSE 178 (H) 07/22/2021   CHOL 80 (L) 07/22/2021   TRIG 157 (H) 07/22/2021   HDL 26 (L) 07/22/2021   LDLCALC 27 07/22/2021   ALT 140 (H) 07/22/2021   AST 74 (H) 07/22/2021   NA 136 07/22/2021   K 5.7 (H) 07/22/2021   CL 99 07/22/2021   CREATININE 1.54 (H) 07/22/2021   BUN 71 (H) 07/22/2021   CO2 21 07/22/2021   TSH 1.610 07/22/2021   HGBA1C 7.2 (A) 10/22/2021     Assessment & Plan:   Diagnoses and all orders for this visit:  Type 2 diabetes  mellitus without complication, without long-term current use of insulin (HCC) -     HgB A1c 7.2 Continue monitoring foods that are high in carbohydrates are the following rice, potatoes, breads, sugars, and pastas.  Reduction in the intake (eating) will assist in lowering your blood sugars. Take your medication everyday -     Lipid panel; Future -     CMP14+EGFR; Future -     Ambulatory referral to Optometry -     empagliflozin (JARDIANCE) 10 MG TABS tablet; Take 1 tablet (10 mg total) by mouth daily before breakfast. -     metFORMIN (GLUCOPHAGE XR) 500 MG 24 hr tablet; Take 1 tablet (500 mg total) by mouth daily with breakfast.  Need for prophylactic vaccination against Streptococcus pneumoniae (pneumococcus) -     Pneumococcal conjugate vaccine 20-valent (Prevnar  20)  Essential hypertension Counseled on blood pressure goal of less than 130/80, low-sodium, DASH diet, medication compliance, 150 minutes of moderate intensity exercise per week. Discussed medication compliance, adverse effects. Did not take medication this morning -     CMP14+EGFR; Future -     lisinopril (ZESTRIL) 20 MG tablet; Take 1 tablet (20 mg total) by mouth daily. -     metoprolol succinate (TOPROL-XL) 50 MG 24 hr tablet; Take 1 tablet (50 mg total) by mouth daily.  Mixed hyperlipidemia  Healthy lifestyle diet of fruits vegetables fish nuts whole grains and low saturated fat . Foods high in cholesterol or liver, fatty meats,cheese, butter avocados, nuts and seeds, chocolate and fried foods. -     Lipid panel; Future  Need for immunization against influenza -     empagliflozin (JARDIANCE) 10 MG TABS tablet; Take 1 tablet (10 mg total) by mouth daily before breakfast. -     lisinopril (ZESTRIL) 20 MG tablet; Take 1 tablet (20 mg total) by mouth daily. -     metFORMIN (GLUCOPHAGE XR) 500 MG 24 hr tablet; Take 1 tablet (500 mg total) by mouth daily with breakfast. -     metoprolol succinate (TOPROL-XL) 50 MG 24 hr tablet; Take 1 tablet (50 mg total) by mouth daily.  Congestive heart failure, unspecified HF chronicity, unspecified heart failure type (HCC) -     metoprolol succinate (TOPROL-XL) 50 MG 24 hr tablet; Take 1 tablet (50 mg total) by mouth daily.  Comprehensive diabetic foot examination, type 2 DM, encounter for [facility]) Completed  Medication refill -     atorvastatin (LIPITOR) 20 MG tablet; Take 1 tablet (20 mg total) by mouth daily. -     empagliflozin (JARDIANCE) 10 MG TABS tablet; Take 1 tablet (10 mg total) by mouth daily before breakfast. -     lisinopril (ZESTRIL) 20 MG tablet; Take 1 tablet (20 mg total) by mouth daily. -     metFORMIN (GLUCOPHAGE XR) 500 MG 24 hr tablet; Take 1 tablet (500 mg total) by mouth daily with breakfast. -     metoprolol succinate  (TOPROL-XL) 50 MG 24 hr tablet; Take 1 tablet (50 mg total) by mouth daily.  I have discontinued Coltin [name]'s atorvastatin, furosemide, and potassium chloride SA. I am also having him start on atorvastatin. Additionally, I am having him maintain his clopidogrel, aspirin EC, empagliflozin, lisinopril, metFORMIN, and metoprolol succinate.  Meds ordered this encounter  Medications   atorvastatin (LIPITOR) 20 MG tablet    Sig: Take 1 tablet (20 mg total) by mouth daily.    Dispense:  90 tablet    Refill:  3   empagliflozin (JARDIANCE)  10 MG TABS tablet    Sig: Take 1 tablet (10 mg total) by mouth daily before breakfast.    Dispense:  90 tablet    Refill:  1   lisinopril (ZESTRIL) 20 MG tablet    Sig: Take 1 tablet (20 mg total) by mouth daily.    Dispense:  90 tablet    Refill:  1   metFORMIN (GLUCOPHAGE XR) 500 MG 24 hr tablet    Sig: Take 1 tablet (500 mg total) by mouth daily with breakfast.    Dispense:  90 tablet    Refill:  1   metoprolol succinate (TOPROL-XL) 50 MG 24 hr tablet    Sig: Take 1 tablet (50 mg total) by mouth daily.    Dispense:  90 tablet    Refill:  1     Follow-up:   Return in about 3 months (around 01/20/2022) for DM/fasting.  The above assessment and management plan was discussed with the patient. The patient verbalized understanding of and has agreed to the management plan. Patient is aware to call the clinic if symptoms fail to improve or worsen. Patient is aware when to return to the clinic for a follow-up visit. Patient educated on when it is appropriate to go to the emergency department.   Juluis Mire, NP-C

## 2021-10-27 ENCOUNTER — Other Ambulatory Visit: Payer: Self-pay

## 2021-10-27 ENCOUNTER — Other Ambulatory Visit (HOSPITAL_COMMUNITY): Payer: Self-pay

## 2021-12-05 ENCOUNTER — Telehealth: Payer: Self-pay | Admitting: Licensed Clinical Social Worker

## 2021-12-05 ENCOUNTER — Ambulatory Visit (INDEPENDENT_AMBULATORY_CARE_PROVIDER_SITE_OTHER): Payer: Self-pay | Admitting: Internal Medicine

## 2021-12-05 ENCOUNTER — Other Ambulatory Visit: Payer: Self-pay

## 2021-12-05 ENCOUNTER — Encounter: Payer: Self-pay | Admitting: Internal Medicine

## 2021-12-05 VITALS — BP 160/84 | HR 79 | Ht 65.0 in | Wt 154.2 lb

## 2021-12-05 DIAGNOSIS — I509 Heart failure, unspecified: Secondary | ICD-10-CM

## 2021-12-05 DIAGNOSIS — Z79899 Other long term (current) drug therapy: Secondary | ICD-10-CM

## 2021-12-05 NOTE — Progress Notes (Signed)
Cardiology Office Note:    Date:  12/05/2021   ID:  Pedro Kemp, DOB October 03, 1956, MRN [id number]  PCP:  Pedro Sessions, NP   [facility] Providers Cardiologist:  Pedro Fus, MD     Referring MD: Pedro Sessions, NP   No chief complaint on file.  Post Cardiac Arrest  History of Present Illness:    Pedro Kemp is a 66 y.o. male with a hx of T2DM, HLD  spanish speaking requires an interpreter, referral for post hospitalization at [facility] for ischemic VT and cardiac arrest (interpretor Pedro Kemp #[id number])   He was admitted at [facility] 05/14/2021 to 05/22/2021.[name] presented to [facility] with chest pain, noted to have high risk NSTEMI and acute CHF with EF 35%. He had troponin up to 15 thousand. He had cath on 7/27 that showed 100% occluded mid LAD s/p shockwave and PCI. He also had disease in his diag s/p PCI. He subsequently had VT arrest 7/29 with ACLS and ROSC. He went back to the cath lab on 7/29 and stent was patent. This was concerning for scar mediated VT.  He had an EP study on 8/2 with inducible sustained monomorphic VT.  Managed with amiodarone.He underwent DC-ICD.  He had a moderate sized hematoma at the ICD site requiring monitoring.  He was also managed for acute respiratory failure 2/2 CHF exacerbation. Crt on discharge was 1.38. A1c was 9 %. He was discharged on asa 81 mg daily,  atorvastatin 80 mg daily, plavix 75 mg daily, lisinopril 20 mg daily, lasix 80 mg daily, metop XL 50 mg daily. Started on Jardiance 10 mg. His A1c is 7.5 %   He had appointments for cardiac rehab, with cardiology at [facility]: Dr Pedro Kemp. ICD check with Dr. Lanae Kemp in August.   He states that he did not do exercise afterwards. After he left the hospital, he had his ICD checked. He would like to have his care here. He reports no shocks. He has no swelling or pain at the site. He denies exertional chest pain or dyspnea. He denies orthopnea, PND, no LE edema. During the day , he does some work around the  house.  Prior to his hospitalization , he had no problems with the heart. He does not smoke.  He denies palpitations, LH. He gets some dizziness. His vision gets blurry. He has no hx of syncope. He is not working will need medication assistance.  Interim Hx: He says that his medications ran out. He did not return to the pharmacy for refills. He feels well. He has no chest pain or dyspnea on exertion. He denies orthopnea,  PND, or LE edema. He can do his activities without any issues.  Cardiology Studies  05/14/2021-Cath - RFA. 100% occluded LAD s/p Synergy 2.75 x 38 mm DES with shockwave to the LAD. 90% D1 s/p 2.5 x 12 synergy DES  Echo- EF 35%, mild MR, PASP 25 mmHg  S/p DC Medtronic ICD 05/20/2021 AAI-DDD VF zone > 182 bpm ATP x3, 40J x6 VT off Base rate 60 bpm   Past Medical History:  Diagnosis Date   Cardiac arrest (HCC)    CHF (congestive heart failure) (HCC)    Coronary artery disease    Diabetes mellitus without complication (HCC)    H/O heart artery stent    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    Past Surgical History:  Procedure Laterality Date   CARDIAC CATHETERIZATION  ICD IMPLANT      Current Medications: No outpatient medications have been marked as taking for the 12/05/21 encounter (Appointment) with Pedro Fus, MD.     Allergies:   Patient has no known allergies.   Social History   Socioeconomic History   Marital status: Single    Spouse name: Not on file   Number of children: Not on file   Years of education: Not on file   Highest education level: Not on file  Occupational History   Not on file  Tobacco Use   Smoking status: Never    Passive exposure: Never   Smokeless tobacco: Never  Substance and Sexual Activity   Alcohol use: Never   Drug use: Never   Sexual activity: Not Currently    Partners: Female  Other Topics Concern   Not on file  Social History Narrative   Not on file   Social Determinants  of Health   Financial Resource Strain: Medium Risk   Difficulty of Paying Living Expenses: Somewhat hard  Food Insecurity: Not on file  Transportation Needs: No Transportation Needs   Lack of Transportation (Medical): No   Lack of Transportation (Non-Medical): No  Physical Activity: Not on file  Stress: Not on file  Social Connections: Not on file     Family History: The patient's family history includes Diabetes in his brother and mother.  ROS:   Please see the history of present illness.     All other systems reviewed and are negative.  EKGs/Labs/Other Studies Reviewed:    The following studies were reviewed today:   EKG:  EKG is  ordered today.  The ekg ordered today demonstrates  NSR, LAFB, anterior Q waves  Recent Labs: 07/22/2021: ALT 140; BUN 71; Creatinine, Ser 1.54; Hemoglobin 11.0; Platelets 171; Potassium 5.7; Sodium 136; TSH 1.610  Recent Lipid Panel    Component Value Date/Time   CHOL 80 (L) 07/22/2021 1454   TRIG 157 (H) 07/22/2021 1454   HDL 26 (L) 07/22/2021 1454   CHOLHDL 3.1 07/22/2021 1454   LDLCALC 27 07/22/2021 1454     Risk Assessment/Calculations:           Physical Exam:    VS:    Vitals:   12/05/21 0831  BP: (!) 160/84  Pulse: 79  SpO2: 98%     Wt Readings from Last 3 Encounters:  10/22/21 143 lb 12.8 oz (65.2 kg)  09/02/21 144 lb 9.6 oz (65.6 kg)  07/22/21 143 lb 9.6 oz (65.1 kg)     GEN:  Well nourished, well developed in no acute distress HEENT: Normal NECK: No JVD; No carotid bruits LYMPHATICS: No lymphadenopathy CARDIAC: RRR, no murmurs, rubs, gallops RESPIRATORY:  Clear to auscultation without rales, wheezing or rhonchi  ABDOMEN: Soft, non-tender, non-distended MUSCULOSKELETAL:  No edema; No deformity  SKIN: Warm and dry NEUROLOGIC:  Alert and oriented x 3 PSYCHIATRIC:  Normal affect   ASSESSMENT:    #Ischemic CM NYHA Class I : EF 35% prescribed GDMT ( AceI, BB, SGLT2). He is euvolemic and asymptomatic.  on  lasix 80 mg daily. Dry weight ~ 69 kg. He is not working and the challenge will be ensuring he can continue to get his medications moving forward. If having chest pain will include nitrate. S/p ICD Medtronic DDD, recent interrogation shows normal lead impedance and thresholds.85% A pacing. Therapy zone 182 bpm. He has device checks planned. - having challenges with medications which is compromising his compliance - continue DAPT (at  least until 05/14/2022) - continue GDMT (lisinopril 20 mg daily, metop XL 50 mg daily, Jardiance 10 mg daily, will add spironolactone once he is consistent on his prior med)  - continue atorvastatin 80 mg daily - continue lasix 80 mg daily - will get a repeat of his TTE - will get labs: lipid profile and BMET once he is on his medications   PLAN:    In order of problems listed above:  TTE Pharmacy assistance Follow up 3 months     Medication Adjustments/Labs and Tests Ordered: Current medicines are reviewed at length with the patient today.  Concerns regarding medicines are outlined above.    Signed, Pedro Fus, MD  12/05/2021 7:47 AM    [facility]

## 2021-12-05 NOTE — Patient Instructions (Signed)
Medication Instructions:  Your physician recommends that you continue on your current medications as directed. Please refer to the Current Medication list given to you today.  *If you need a refill on your cardiac medications before your next appointment, please call your pharmacy*   Lab Work: None If you have labs (blood work) drawn today and your tests are completely normal, you will receive your results only by: MyChart Message (if you have MyChart) OR A paper copy in the mail If you have any lab test that is abnormal or we need to change your treatment, we will call you to review the results.   Testing/Procedures: Your physician has requested that you have an echocardiogram. Echocardiography is a painless test that uses sound waves to create images of your heart. It provides your doctor with information about the size and shape of your heart and how well your hearts chambers and valves are working. This procedure takes approximately one hour. There are no restrictions for this procedure.    Follow-Up: At [facility], you and your health needs are our priority.  As part of our continuing mission to provide you with exceptional heart care, we have created designated Provider Care Teams.  These Care Teams include your primary Cardiologist (physician) and Advanced Practice Providers (APPs -  Physician Assistants and Nurse Practitioners) who all work together to provide you with the care you need, when you need it.  We recommend signing up for the patient portal called "MyChart".  Sign up information is provided on this After Visit Summary.  MyChart is used to connect with patients for Virtual Visits (Telemedicine).  Patients are able to view lab/test results, encounter notes, upcoming appointments, etc.  Non-urgent messages can be sent to your provider as well.   To learn more about what you can do with MyChart, go to [email or web address].    Your next appointment:   3 month(s)  The  format for your next appointment:   In Person  Provider:   Maisie Fus, MD     Other Instructions Ecocardiograma Echocardiogram Un ecocardiograma es una prueba que [place] ondas sonoras (ecografa) para obtener imgenes del corazn. Las imgenes de un ecocardiograma pueden proporcionar informacin importante sobre lo siguiente: Tamao y forma del corazn. [place], el grosor y el movimiento de las paredes del Programmer, multimedia. La funcin y la fuerza del msculo cardaco. La funcin de la vlvula cardaca o si tiene estenosis. Se presenta estenosis cuando las vlvulas cardacas son demasiado estrechas. Si la sangre [employer] atrs a travs de las vlvulas cardacas (regurgitacin). Un tumor o crecimiento infeccioso alrededor de las vlvulas cardacas. reas del msculo cardaco que no funcionan bien debido a un flujo sanguneo deficiente o a una lesin a causa de un infarto de miocardio. Signos de aneurisma. Un aneurisma es una parte debilitada o daada en la pared de una arteria. La pared se abulta debido a la fuerza normal que produce el bombeo de la sangre a travs del cuerpo. Informe al mdico acerca de lo siguiente: Cualquier alergia que tenga. Todos los [employer] [place], incluidos vitaminas, hierbas, gotas oftlmicas, cremas y [address] de [address]. Cualquier trastorno de la sangre que tenga. Cirugas a las que se haya sometido. Cualquier afeccin mdica que tenga. Si est embarazada o podra estarlo. Cules son los riesgos? Por lo general, se trata de una prueba segura. Sin embargo, pueden presentarse problemas, como una reaccin alrgica al tinte Investment banker, operational) que se puede usar durante la prueba. Qu ocurre antes de  esta prueba? No se requiere Lobbyist. Podr comer y beber normalmente. Qu ocurre durante la prueba?  Se quitar la ropa de la cintura para [place] y se pondr una bata de hospital. Es posible que le coloquen electrodos oparches de  electrocardiograma (ECG) en el pecho. Luego los electrodos o parches se conectan a un dispositivo que controla su ritmo y frecuencia cardaca. Se acostar sobre una mesa para que le realicen una ecografa. Se le aplicar un gel en el pecho para ayudar a que las ondas sonoras pasen a travs de la piel. Se presionar un dispositivo de mano, llamado transductor, contra el pecho y se mover sobre su corazn. El transductor produce ondas sonoras que viajan hasta el corazn y rebotan (o producen "eco") Air cabin crew. Estas ondas sonoras se captarn en tiempo real y se transformarn en imgenes del corazn que se pueden ver en un monitor de vdeo. Las imgenes se grabarn en una computadora y sern revisadas por el mdico. Podrn solicitarle que cambie de posicin o que contenga la respiracin durante un lapso breve. Esto hace que sea ms fcil obtener diferentes vistas o mejores vistas del corazn. En algunos casos, puede recibir [place] a travs de una va intravenosa (i.v). en una de las venas. Esto puede mejorar la calidad de las imgenes del corazn. El procedimiento puede variar segn el mdico y el hospital. Qu puedo esperar despus de la prueba? Puede retomar su rutina diaria normal, incluidos la dieta, las actividades y [employer], a menos que su mdico le indique lo contrario. Siga estas instrucciones en su casa: Recoger los [employer] de la prueba es su responsabilidad. Consulte al mdico o pregunte en el departamento donde se realiza la prueba cundo estarn [employer]. Cumpla con todas las visitas de seguimiento. Esto es importante. Resumen Un ecocardiograma es una prueba que [place] ondas sonoras (ecografa) para obtener imgenes del corazn. Las [employer] de un ecocardiograma pueden brindar informacin importante sobre el tamao y la forma del corazn, la funcin del msculo cardaco, la funcin de la vlvula cardaca y otros posibles problemas cardacos. No es necesario  prepararse para esta prueba. Podr comer y beber normalmente. Al finalizar el ecocardiograma, puede retomar su rutina diaria normal, a menos que su mdico le indique lo contrario. Esta informacin no tiene Theme park manager el consejo del mdico. Asegrese de hacerle al mdico cualquier pregunta que tenga. Document Revised: 08/01/2020 Document Reviewed: 08/01/2020 Elsevier Patient Education  2022 [employer].

## 2021-12-05 NOTE — Telephone Encounter (Addendum)
Received referral that pt had not been taking/did not understand his medication regimen. I shared that pt had been approved for [employer], [employer] (pharmacy program) and [employer] which means that he will be able to obtain his medications at little to no cost through [facility]. Noted pt meds were all sent to [facility]. They should be sent to [facility] pharmacy where he can utilize his Blue Card to help with costs.   Octavio Graves, MSW, LCSW Clinical Social Worker II [facility] Navigation  [phone]- work cell phone (preferred) [phone]- desk phone

## 2021-12-08 ENCOUNTER — Telehealth: Payer: Self-pay | Admitting: Licensed Clinical Social Worker

## 2021-12-08 ENCOUNTER — Other Ambulatory Visit: Payer: Self-pay

## 2021-12-08 NOTE — Telephone Encounter (Signed)
LCSW reached out to pt sister [name] via text message, reminded her that pt medications are ready at [facility] to pick up and pt should bring documents ([place] [employer] with him). Provided her with [facility] pharmacy number for any additional questions/concerns.   Octavio Graves, MSW, LCSW Clinical Social Worker II [facility] Navigation  [phone]- work cell phone (preferred) [phone]- desk phone

## 2021-12-16 ENCOUNTER — Ambulatory Visit (HOSPITAL_COMMUNITY): Payer: Self-pay | Attending: Internal Medicine

## 2021-12-16 ENCOUNTER — Other Ambulatory Visit: Payer: Self-pay

## 2021-12-16 DIAGNOSIS — I509 Heart failure, unspecified: Secondary | ICD-10-CM | POA: Insufficient documentation

## 2021-12-16 LAB — ECHOCARDIOGRAM COMPLETE
Area-P 1/2: 4.21 cm2
S' Lateral: 4.6 cm

## 2021-12-16 MED ORDER — PERFLUTREN LIPID MICROSPHERE
1.0000 mL | INTRAVENOUS | Status: AC | PRN
  Administered 2021-12-16: 1 mL via INTRAVENOUS

## 2022-01-07 ENCOUNTER — Ambulatory Visit (INDEPENDENT_AMBULATORY_CARE_PROVIDER_SITE_OTHER): Payer: Self-pay | Admitting: Pharmacist

## 2022-01-07 ENCOUNTER — Other Ambulatory Visit: Payer: Self-pay

## 2022-01-07 VITALS — BP 156/92 | HR 64 | Resp 15 | Ht 66.0 in | Wt 156.4 lb

## 2022-01-07 DIAGNOSIS — E119 Type 2 diabetes mellitus without complications: Secondary | ICD-10-CM

## 2022-01-07 DIAGNOSIS — Z760 Encounter for issue of repeat prescription: Secondary | ICD-10-CM

## 2022-01-07 DIAGNOSIS — I10 Essential (primary) hypertension: Secondary | ICD-10-CM

## 2022-01-07 DIAGNOSIS — I509 Heart failure, unspecified: Secondary | ICD-10-CM

## 2022-01-07 DIAGNOSIS — Z79899 Other long term (current) drug therapy: Secondary | ICD-10-CM

## 2022-01-07 DIAGNOSIS — E785 Hyperlipidemia, unspecified: Secondary | ICD-10-CM

## 2022-01-07 DIAGNOSIS — Z23 Encounter for immunization: Secondary | ICD-10-CM

## 2022-01-07 DIAGNOSIS — E782 Mixed hyperlipidemia: Secondary | ICD-10-CM

## 2022-01-07 MED ORDER — LISINOPRIL 40 MG PO TABS
40.0000 mg | ORAL_TABLET | Freq: Every day | ORAL | 1 refills | Status: DC
  Filled 2022-01-07: qty 30, 30d supply, fill #0

## 2022-01-07 MED ORDER — CLOPIDOGREL BISULFATE 75 MG PO TABS
75.0000 mg | ORAL_TABLET | Freq: Every day | ORAL | 1 refills | Status: DC
  Filled 2022-01-07: qty 90, 90d supply, fill #0
  Filled 2022-04-20: qty 30, 30d supply, fill #1
  Filled 2022-05-27: qty 30, 30d supply, fill #2
  Filled 2022-06-29: qty 30, 30d supply, fill #3

## 2022-01-07 MED ORDER — METFORMIN HCL ER 500 MG PO TB24
500.0000 mg | ORAL_TABLET | Freq: Every day | ORAL | 1 refills | Status: DC
  Filled 2022-01-07: qty 90, 90d supply, fill #0
  Filled 2022-04-20: qty 30, 30d supply, fill #1
  Filled 2022-05-27: qty 30, 30d supply, fill #2
  Filled 2022-06-29: qty 30, 30d supply, fill #3

## 2022-01-07 MED ORDER — METOPROLOL SUCCINATE ER 50 MG PO TB24
50.0000 mg | ORAL_TABLET | Freq: Every day | ORAL | 1 refills | Status: DC
  Filled 2022-01-07: qty 90, 90d supply, fill #0
  Filled 2022-04-20: qty 90, 90d supply, fill #1

## 2022-01-07 MED ORDER — ATORVASTATIN CALCIUM 20 MG PO TABS
20.0000 mg | ORAL_TABLET | Freq: Every day | ORAL | 3 refills | Status: DC
  Filled 2022-01-07: qty 90, 90d supply, fill #0
  Filled 2022-04-20: qty 30, 30d supply, fill #1
  Filled 2022-05-27: qty 90, 90d supply, fill #2

## 2022-01-07 MED ORDER — EMPAGLIFLOZIN 10 MG PO TABS
10.0000 mg | ORAL_TABLET | Freq: Every day | ORAL | 1 refills | Status: DC
  Filled 2022-01-07: qty 90, 90d supply, fill #0

## 2022-01-07 NOTE — Progress Notes (Signed)
Patient ID: Pedro Kemp                 DOB: Dec 22, 1955                      MRN: [id number] ? ? ? ? ?HPI: ?Pedro Kemp is a 66 y.o. male referred by Dr. Wyline Mood to pharmacy clinic for HF medication management. PMH is significant for CHF, cardiac arrest, CAD, and T2DM (A1c 7.2) . Most recent LVEF 30-35% on 12/16/21. ? ?Patient presents today with translator.  Unfortunately does not know which medications he is taking but reports he is compliant with all medications except for aspirin. Pharmacy would not dispense him his aspirin. ? ?Has not been checking his weight or blood pressure at home. Has been checking blood sugar however and reports fasting readings between 110-130. ? ?Patient denies dizziness lightheadedness, and fatigue. Denies chest pain or palpitations. Denies SOB even with activity. Able to complete all ADLs. Has no LEE. ? ?Reports he needs refills on all medications. Needs lab work updated. ? ?Current CHF meds:  ?Jardiance 10mg  daily ?Toprol XL 50mg  daily ?Lisinopril 20mg  daily ? ?BP goal: <130/80 ? ?Wt Readings from Last 3 Encounters:  ?12/05/21 154 lb 3.2 oz (69.9 kg)  ?10/22/21 143 lb 12.8 oz (65.2 kg)  ?09/02/21 144 lb 9.6 oz (65.6 kg)  ? ?BP Readings from Last 3 Encounters:  ?12/05/21 (!) 160/84  ?10/22/21 (!) 170/83  ?09/02/21 128/68  ? ?Pulse Readings from Last 3 Encounters:  ?12/05/21 79  ?10/22/21 (!) 59  ?09/02/21 60  ? ? ?Renal function: ?CrCl cannot be calculated (Patient's most recent lab result is older than the maximum 21 days allowed.). ? ?Past Medical History:  ?Diagnosis Date  ? Cardiac arrest [facility])   ? CHF (congestive heart failure) (HCC)   ? Coronary artery disease   ? Diabetes mellitus without complication (HCC)   ? H/O heart artery stent   ? 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  ? ? ?Current Outpatient Medications on File Prior to Visit  ?Medication Sig Dispense Refill  ? aspirin EC 81 MG tablet Take 81 mg by mouth daily. Swallow whole.    ?  atorvastatin (LIPITOR) 20 MG tablet Take 1 tablet (20 mg total) by mouth daily. 90 tablet 3  ? clopidogrel (PLAVIX) 75 MG tablet Take 75 mg by mouth daily.    ? empagliflozin (JARDIANCE) 10 MG TABS tablet Take 1 tablet (10 mg total) by mouth daily before breakfast. 90 tablet 1  ? lisinopril (ZESTRIL) 20 MG tablet Take 1 tablet (20 mg total) by mouth daily. 90 tablet 1  ? metFORMIN (GLUCOPHAGE-XR) 500 MG 24 hr tablet Take 1 tablet (500 mg total) by mouth daily with breakfast. 90 tablet 1  ? metoprolol succinate (TOPROL-XL) 50 MG 24 hr tablet Take 1 tablet (50 mg total) by mouth daily. 90 tablet 1  ? ?No current facility-administered medications on file prior to visit.  ? ? ?No Known Allergies ? ? ?Assessment/Plan: ? ?1. CHF -  Patient BP in room 156/92 which is above goal of <130/80. Patient reports compliance but unfortunately is unclear about which medications he is taking or supposed to be taking.  Will increase lisinopril at this time to 40mg  and have patient updated pre ordered lab work. Recheck in 4 weeks and consider spironolactone at that time. Gave patient 2 months of aspirin samples. ? ?Continue: ?Jardiance 10mg  daily ?Toprol XL 50mg  daily ? ?  Increase lisinopril to 40mg  daily ?Recheck in 4 weeks. ? ? , PharmD, BCACP, CDCES, CPP ?[address], [address] ?[place], [place], [place] ?Phone: [phone], Fax: [phone]  ?

## 2022-01-08 ENCOUNTER — Other Ambulatory Visit: Payer: Self-pay

## 2022-01-08 LAB — CMP14+EGFR
ALT: 29 IU/L (ref 0–44)
AST: 28 IU/L (ref 0–40)
Albumin/Globulin Ratio: 1.7 (ref 1.2–2.2)
Albumin: 4.2 g/dL (ref 3.8–4.8)
Alkaline Phosphatase: 119 IU/L (ref 44–121)
BUN/Creatinine Ratio: 16 (ref 10–24)
BUN: 22 mg/dL (ref 8–27)
Bilirubin Total: 0.2 mg/dL (ref 0.0–1.2)
CO2: 19 mmol/L — ABNORMAL LOW (ref 20–29)
Calcium: 8.7 mg/dL (ref 8.6–10.2)
Chloride: 102 mmol/L (ref 96–106)
Creatinine, Ser: 1.38 mg/dL — ABNORMAL HIGH (ref 0.76–1.27)
Globulin, Total: 2.5 g/dL (ref 1.5–4.5)
Glucose: 324 mg/dL — ABNORMAL HIGH (ref 70–99)
Potassium: 5.1 mmol/L (ref 3.5–5.2)
Sodium: 139 mmol/L (ref 134–144)
Total Protein: 6.7 g/dL (ref 6.0–8.5)
eGFR: 57 mL/min/{1.73_m2} — ABNORMAL LOW (ref >59)

## 2022-01-08 LAB — LIPID PANEL
Chol/HDL Ratio: 3.2 ratio (ref 0.0–5.0)
Cholesterol, Total: 99 mg/dL — ABNORMAL LOW (ref 100–199)
HDL: 31 mg/dL — ABNORMAL LOW (ref >39)
LDL Chol Calc (NIH): 31 mg/dL (ref 0–99)
Triglycerides: 236 mg/dL — ABNORMAL HIGH (ref 0–149)
VLDL Cholesterol Cal: 37 mg/dL (ref 5–40)

## 2022-01-09 ENCOUNTER — Other Ambulatory Visit: Payer: Self-pay

## 2022-01-29 ENCOUNTER — Emergency Department (HOSPITAL_COMMUNITY): Payer: Self-pay

## 2022-01-29 ENCOUNTER — Inpatient Hospital Stay (HOSPITAL_COMMUNITY)
Admission: EM | Admit: 2022-01-29 | Discharge: 2022-01-31 | DRG: 291 | Disposition: A | Discharge status: Home / Self Care | Payer: Self-pay | Attending: Internal Medicine | Admitting: Internal Medicine

## 2022-01-29 ENCOUNTER — Other Ambulatory Visit: Payer: Self-pay

## 2022-01-29 DIAGNOSIS — Z8674 Personal history of sudden cardiac arrest: Secondary | ICD-10-CM

## 2022-01-29 DIAGNOSIS — Z833 Family history of diabetes mellitus: Secondary | ICD-10-CM

## 2022-01-29 DIAGNOSIS — D72829 Elevated white blood cell count, unspecified: Secondary | ICD-10-CM | POA: Diagnosis present

## 2022-01-29 DIAGNOSIS — R778 Other specified abnormalities of plasma proteins: Secondary | ICD-10-CM

## 2022-01-29 DIAGNOSIS — I2510 Atherosclerotic heart disease of native coronary artery without angina pectoris: Secondary | ICD-10-CM | POA: Diagnosis present

## 2022-01-29 DIAGNOSIS — R0600 Dyspnea, unspecified: Principal | ICD-10-CM

## 2022-01-29 DIAGNOSIS — E119 Type 2 diabetes mellitus without complications: Secondary | ICD-10-CM

## 2022-01-29 DIAGNOSIS — Z7902 Long term (current) use of antithrombotics/antiplatelets: Secondary | ICD-10-CM

## 2022-01-29 DIAGNOSIS — Z7984 Long term (current) use of oral hypoglycemic drugs: Secondary | ICD-10-CM

## 2022-01-29 DIAGNOSIS — E785 Hyperlipidemia, unspecified: Secondary | ICD-10-CM | POA: Diagnosis present

## 2022-01-29 DIAGNOSIS — E1159 Type 2 diabetes mellitus with other circulatory complications: Secondary | ICD-10-CM | POA: Diagnosis present

## 2022-01-29 DIAGNOSIS — I255 Ischemic cardiomyopathy: Secondary | ICD-10-CM

## 2022-01-29 DIAGNOSIS — E1169 Type 2 diabetes mellitus with other specified complication: Secondary | ICD-10-CM

## 2022-01-29 DIAGNOSIS — I5020 Unspecified systolic (congestive) heart failure: Secondary | ICD-10-CM

## 2022-01-29 DIAGNOSIS — I10 Essential (primary) hypertension: Secondary | ICD-10-CM | POA: Diagnosis present

## 2022-01-29 DIAGNOSIS — I110 Hypertensive heart disease with heart failure: Principal | ICD-10-CM | POA: Diagnosis present

## 2022-01-29 DIAGNOSIS — Z7982 Long term (current) use of aspirin: Secondary | ICD-10-CM

## 2022-01-29 DIAGNOSIS — Z79899 Other long term (current) drug therapy: Secondary | ICD-10-CM

## 2022-01-29 DIAGNOSIS — Z955 Presence of coronary angioplasty implant and graft: Secondary | ICD-10-CM

## 2022-01-29 DIAGNOSIS — I5023 Acute on chronic systolic (congestive) heart failure: Secondary | ICD-10-CM | POA: Diagnosis present

## 2022-01-29 DIAGNOSIS — Z95810 Presence of automatic (implantable) cardiac defibrillator: Secondary | ICD-10-CM

## 2022-01-29 LAB — CBC
HCT: 46.2 % (ref 39.0–52.0)
Hemoglobin: 14.4 g/dL (ref 13.0–17.0)
MCH: 28.6 pg (ref 26.0–34.0)
MCHC: 31.2 g/dL (ref 30.0–36.0)
MCV: 91.7 fL (ref 80.0–100.0)
Platelets: 232 10*3/uL (ref 150–400)
RBC: 5.04 MIL/uL (ref 4.22–5.81)
RDW: 13.1 % (ref 11.5–15.5)
WBC: 19.7 10*3/uL — ABNORMAL HIGH (ref 4.0–10.5)
nRBC: 0 % (ref 0.0–0.2)

## 2022-01-29 LAB — BASIC METABOLIC PANEL
Anion gap: 8 (ref 5–15)
BUN: 25 mg/dL — ABNORMAL HIGH (ref 8–23)
CO2: 21 mmol/L — ABNORMAL LOW (ref 22–32)
Calcium: 9 mg/dL (ref 8.9–10.3)
Chloride: 108 mmol/L (ref 98–111)
Creatinine, Ser: 1.26 mg/dL — ABNORMAL HIGH (ref 0.61–1.24)
GFR, Estimated: >60 mL/min (ref >60)
Glucose, Bld: 218 mg/dL — ABNORMAL HIGH (ref 70–99)
Potassium: 4.7 mmol/L (ref 3.5–5.1)
Sodium: 137 mmol/L (ref 135–145)

## 2022-01-29 LAB — TROPONIN I (HIGH SENSITIVITY): Troponin I (High Sensitivity): 370 ng/L (ref <18)

## 2022-01-29 LAB — BRAIN NATRIURETIC PEPTIDE: B Natriuretic Peptide: 745.4 pg/mL — ABNORMAL HIGH (ref 0.0–100.0)

## 2022-01-29 MED ORDER — FUROSEMIDE 10 MG/ML IJ SOLN
40.0000 mg | Freq: Two times a day (BID) | INTRAMUSCULAR | Status: DC
  Administered 2022-01-30 (×2): 40 mg via INTRAVENOUS
  Filled 2022-01-29 (×2): qty 4

## 2022-01-29 MED ORDER — ASPIRIN EC 81 MG PO TBEC
81.0000 mg | DELAYED_RELEASE_TABLET | Freq: Every day | ORAL | Status: DC
  Administered 2022-01-30 – 2022-01-31 (×2): 81 mg via ORAL
  Filled 2022-01-29 (×2): qty 1

## 2022-01-29 MED ORDER — ACETAMINOPHEN 325 MG PO TABS: 650.0000 mg | ORAL_TABLET | Freq: Four times a day (QID) | ORAL | Status: DC | PRN

## 2022-01-29 MED ORDER — LISINOPRIL 20 MG PO TABS
40.0000 mg | ORAL_TABLET | Freq: Every day | ORAL | Status: DC
  Administered 2022-01-30: 40 mg via ORAL
  Filled 2022-01-29: qty 2

## 2022-01-29 MED ORDER — ALBUTEROL SULFATE HFA 108 (90 BASE) MCG/ACT IN AERS: 2.0000 | INHALATION_SPRAY | RESPIRATORY_TRACT | Status: DC | PRN

## 2022-01-29 MED ORDER — CLOPIDOGREL BISULFATE 75 MG PO TABS
75.0000 mg | ORAL_TABLET | Freq: Every day | ORAL | Status: DC
  Administered 2022-01-30 – 2022-01-31 (×2): 75 mg via ORAL
  Filled 2022-01-29 (×2): qty 1

## 2022-01-29 MED ORDER — INSULIN ASPART 100 UNIT/ML IJ SOLN
0.0000 [IU] | Freq: Three times a day (TID) | INTRAMUSCULAR | Status: DC
  Administered 2022-01-30: 3 [IU] via SUBCUTANEOUS

## 2022-01-29 MED ORDER — ACETAMINOPHEN 650 MG RE SUPP: 650.0000 mg | Freq: Four times a day (QID) | RECTAL | Status: DC | PRN

## 2022-01-29 MED ORDER — ATORVASTATIN CALCIUM 10 MG PO TABS
20.0000 mg | ORAL_TABLET | Freq: Every day | ORAL | Status: DC
  Administered 2022-01-30 – 2022-01-31 (×2): 20 mg via ORAL
  Filled 2022-01-29 (×2): qty 2

## 2022-01-29 MED ORDER — EMPAGLIFLOZIN 10 MG PO TABS
10.0000 mg | ORAL_TABLET | Freq: Every day | ORAL | Status: DC
  Administered 2022-01-31: 10 mg via ORAL
  Filled 2022-01-29 (×2): qty 1

## 2022-01-29 MED ORDER — METOPROLOL SUCCINATE ER 50 MG PO TB24
50.0000 mg | ORAL_TABLET | Freq: Every day | ORAL | Status: DC
  Administered 2022-01-30 – 2022-01-31 (×2): 50 mg via ORAL
  Filled 2022-01-29: qty 1
  Filled 2022-01-29: qty 2

## 2022-01-29 MED ORDER — SENNOSIDES-DOCUSATE SODIUM 8.6-50 MG PO TABS: 1.0000 | ORAL_TABLET | Freq: Every evening | ORAL | Status: DC | PRN

## 2022-01-29 MED ORDER — SODIUM CHLORIDE 0.9% FLUSH
3.0000 mL | Freq: Two times a day (BID) | INTRAVENOUS | Status: DC
  Administered 2022-01-30 – 2022-01-31 (×3): 3 mL via INTRAVENOUS

## 2022-01-29 MED ORDER — ONDANSETRON HCL 4 MG/2ML IJ SOLN: 4.0000 mg | Freq: Four times a day (QID) | INTRAMUSCULAR | Status: DC | PRN

## 2022-01-29 MED ORDER — ONDANSETRON HCL 4 MG PO TABS: 4.0000 mg | ORAL_TABLET | Freq: Four times a day (QID) | ORAL | Status: DC | PRN

## 2022-01-29 MED ORDER — ENOXAPARIN SODIUM 40 MG/0.4ML IJ SOSY
40.0000 mg | PREFILLED_SYRINGE | INTRAMUSCULAR | Status: DC
  Administered 2022-01-31: 40 mg via SUBCUTANEOUS
  Filled 2022-01-29 (×2): qty 0.4

## 2022-01-29 NOTE — H&P (Signed)
Initial vitals showed ?History and Physical  ? ? ?Lee Kuang NHA:[id number] DOB: 07/08/1956 DOA: 01/29/2022 ? ?PCP: [name], NP  ?Patient coming from: Home ? ?I have personally briefly reviewed patient's old medical records in [place] ? ?Chief Complaint: Shortness of breath ? ?HPI: ?Pedro Kemp is a Spanish-speaking 66 y.o. male with medical history significant for HFrEF (EF 30-35% by TTE 12/16/2021), CAD s/p DES to mid LAD (04/2021), history of VT arrest s/p ICD, T2DM, HTN, HLD who presented to the ED for evaluation of dyspnea.  Remote video interpreter Pedro Kemp [phone] utilized to facilitate communication. ? ?Patient states he was in his usual state of health until 6 AM morning of 4/13 when he awoke short of breath from sleep.  He has been having exertional dyspnea throughout the day which is new compared to his baseline.  He has not had any cough, chest pain, palpitations, subjective fevers, chills, diaphoresis, nausea, vomiting, abdominal pain, dysuria.  He reports good urine output.  He has not had any significant swelling in his lower extremities. ? ?Of note, patient is not currently taking any diuretics.  Review of chart shows that he was previously on Lasix 80 mg daily which was discontinued by his PCP on 10/22/2021. ? ?ED Course  Labs/Imaging on admission: I have personally reviewed following labs and imaging studies. ? ?Initial vitals showed BP 175/93, pulse 96, RR 22, temp 90.3 ?F, SPO2 91% on room air. ? ?Labs show WBC 19.7, hemoglobin 14.4, platelets 232,000, sodium 137, potassium 4.7, bicarb 21, BUN 25, creatinine 1.26, EGFR >60, serum glucose 218, BNP 745.4, troponin 370. ? ?2 view chest x-ray shows interstitial pulmonary edema with tiny bilateral pleural effusions.  Left-sided ICD noted in place. ? ?Cardiology were consulted and will evaluate patient.  Medical admission was recommended.  The hospitalist service was consulted to admit for further evaluation  management. ? ?Review of Systems: All systems reviewed and are negative except as documented in history of present illness above. ? ? ?Past Medical History:  ?Diagnosis Date  ? Cardiac arrest [facility])   ? CHF (congestive heart failure) ([place])   ? Coronary artery disease   ? Diabetes mellitus without complication ([place])   ? H/O heart artery stent   ? 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  ? ? ?Past Surgical History:  ?Procedure Laterality Date  ? CARDIAC CATHETERIZATION    ? ICD IMPLANT    ? ? ?Social History: ? reports that he has never smoked. He has never been exposed to tobacco smoke. He has never used smokeless tobacco. He reports that he does not drink alcohol and does not use drugs. ? ?No Known Allergies ? ?Family History  ?Problem Relation Age of Onset  ? Diabetes Mother   ? Diabetes Brother   ? ? ? ?Prior to Admission medications   ?Medication Sig Start Date End Date Taking? Authorizing Provider  ?aspirin EC 81 MG tablet Take 81 mg by mouth daily. Swallow whole.   Yes [provider]  ?atorvastatin (LIPITOR) 20 MG tablet Take 1 tablet (20 mg total) by mouth daily. 01/07/22  Yes Janina Mayo, MD  ?clopidogrel (PLAVIX) 75 MG tablet Take 1 tablet (75 mg total) by mouth daily. 01/07/22  Yes Janina Mayo, MD  ?empagliflozin (JARDIANCE) 10 MG TABS tablet Take 1 tablet (10 mg total) by mouth daily before breakfast. 01/07/22  Yes Branch, Royetta Crochet, MD  ?lisinopril (ZESTRIL) 40 MG tablet Take 1 tablet (  40 mg total) by mouth daily. 01/07/22  Yes Janina Mayo, MD  ?metFORMIN (GLUCOPHAGE-XR) 500 MG 24 hr tablet Take 1 tablet (500 mg total) by mouth daily with breakfast. 01/07/22  Yes Janina Mayo, MD  ?metoprolol succinate (TOPROL-XL) 50 MG 24 hr tablet Take 1 tablet (50 mg total) by mouth daily. 01/07/22  Yes Janina Mayo, MD  ? ? ?Physical Exam: ?Vitals:  ? 01/29/22 2327 01/30/22 0000 01/30/22 0025 01/30/22 0030  ?BP:  129/63 (!) 144/64 126/65  ?Pulse: 65 65 66 65  ?Resp: (!) 22  (!) 22 (!) 23 18  ?Temp:      ?TempSrc:      ?SpO2: 94% 93% 95% 94%  ?Weight: 70.8 kg     ? ?Constitutional: NAD, calm, comfortable ?Eyes: PERRL, lids and conjunctivae normal ?ENMT: Mucous membranes are moist. Posterior pharynx clear of any exudate or lesions.Normal dentition.  ?Neck: normal, supple, no masses. ?Respiratory: Inspiratory crackles bilaterally. Normal respiratory effort. No accessory muscle use.  ?Cardiovascular: Regular rate and rhythm, no murmurs / rubs / gallops. No extremity edema. 2+ pedal pulses.  ICD in place left upper chest wall, nontender. ?Abdomen: no tenderness, no masses palpated. No hepatosplenomegaly. Bowel sounds positive.  ?Musculoskeletal: no clubbing / cyanosis. No joint deformity upper and lower extremities. Good ROM, no contractures. Normal muscle tone.  ?Skin: no rashes, lesions, ulcers. No induration ?Neurologic: CN 2-12 grossly intact. Sensation intact. Strength 5/5 in all 4.  ?Psychiatric: Alert and oriented x 3. Normal mood.  ? ?EKG: Personally reviewed. Sinus rhythm, rate 98, LAD, T wave inversion leads V4, V5.  When compared to prior rate is faster and T wave changes similar. ? ?Assessment/Plan ?Principal Problem: ?  Acute on chronic HFrEF (heart failure with reduced ejection fraction) ([place]) ?Active Problems: ?  Coronary artery disease ?  Elevated troponin ?  Hypertension associated with diabetes ([place]) ?  Type 2 diabetes mellitus without complication, without long-term current use of insulin ([place]) ?  Hyperlipidemia associated with type 2 diabetes mellitus ([place]) ?  History of cardiac arrest ?  Leukocytosis ?  ?Pedro Kemp is a Spanish-speaking 66 y.o. male with medical history significant for HFrEF (EF 30-35% by TTE 12/16/2021), CAD s/p DES to mid LAD (04/2021), history of VT arrest s/p ICD, T2DM, HTN, HLD who is admitted with acute on chronic HFrEF. ? ?Assessment and Plan: ?* Acute on chronic HFrEF (heart failure with reduced ejection fraction) ([place]) ?Dyspnea likely  related to acute on chronic HFrEF with pulmonary edema.  BNP elevated to 745.4.  Initially hypoxic on arrival, now saturating well on room air.  His Lasix was discontinued in January, unclear reason.  Recent TTE 12/16/2021 showed EF 30-35%. ?-Start on IV Lasix 40 mg twice daily ?-Monitor strict I/O's and daily weights ?-Supplemental oxygen as needed ?-Continue home lisinopril 40 mg daily, Toprol-XL 50 mg daily ?-Continue home Jardiance 10 mg daily ?-Cardiology following ? ?Coronary artery disease ?S/p DES to mid LAD 04/2021 ?Elevated troponin ?Troponin elevated to 370 >> 427 on repeat.  He denies any chest pain.  EKG shows chronic T wave changes without acute changes.  Suspect demand ischemia in setting of acute on chronic HFrEF. ?-Continue home aspirin 81 mg daily and Plavix 75 mg daily ?-Continue atorvastatin 20 mg daily ?-Continue Toprol-XL ?-Hold off on IV heparin at this time ?-Trend troponin ? ?Hypertension associated with diabetes ([place]) ?Continue home lisinopril 40 mg daily, Toprol-XL 50 mg daily, IV Lasix as above. ? ?Type 2 diabetes mellitus without complication, without  long-term current use of insulin ([place]) ?Holding metformin.  Continue Jardiance, place on SSI. ? ?Leukocytosis ?WBC 19.7 on admission.  No obvious signs/symptoms of active infection.  Review smear and repeat CBC with differential in AM. ? ?History of cardiac arrest ?History of VT arrest s/p ICD. ? ?Hyperlipidemia associated with type 2 diabetes mellitus ([place]) ?Continue atorvastatin. ? ?DVT prophylaxis: enoxaparin (LOVENOX) injection 40 mg Start: 01/30/22 1000 ?Code Status: Full code ?Family Communication: Discussed with patient's sister at bedside ?Disposition Plan: From home and likely discharge to home pending clinical progress ?Consults called: Cardiology ?Severity of Illness: ?The appropriate patient status for this patient is INPATIENT. Inpatient status is judged to be reasonable and necessary in order to provide the required intensity of  service to ensure the patient's safety. The patient's presenting symptoms, physical exam findings, and initial radiographic and laboratory data in the context of their chronic comorbidities is felt to place them at high risk f

## 2022-01-29 NOTE — ED Notes (Signed)
Cardiologist MD at bedside, language line interpreter in use ?

## 2022-01-29 NOTE — Consult Note (Signed)
?Cardiology Consultation:  ? ?Patient ID: Pedro Kemp ?MRN: [id number]; DOB: [birth year] ? ?Admit date: 01/29/2022 ?Date of Consult: 01/29/2022 ? ?PCP:  Pedro Perna, NP ?  ?[facility] Providers ?Cardiologist:  Pedro Mayo, MD     ? ? ?Patient Profile:  ? ?Pedro Kemp is a 66 y.o. male with a hx of CAD s/p shockwave and DES to mLAD and diag 04/2021, hx of VT s/p p DC Medtronic ICD 05/20/2021, ICM, HFrEF (30-35%) T2DM, HLD and CKD who is being seen 01/29/2022 for the evaluation of shortness of breath at the request of Dr. Tobe Sos. ? ?History of Present Illness:  ? ?[name] reports one episode of acute onset shortness of breath that lasted 1 hour before he was about to go to work. The episode has since resolved. Of note, "In triage, patient was found to be hypoxic and was put on 4 L oxygen". Patient denies fever, chills, dizziness, syncope, lightheadedness, cough,  ICD shock, chest discomfort, heart palpitations, nausea, vomiting, PND, early satiety, abdominal fullness, dysuria, diarrhea, pedal edema or any bleeding events. Currently, he is on room air, asymptomatic, no acute distress, resting in his bed Beverely Low [phone]) ? ?Patient reports compliant with his medications, however he does not know which medications he is taking. Has not been checking his weight or blood pressure at home. ? ?On admission ?WBC 19.7, HH wnl, Cr 1.26 (at baseline), K 4.7, BNP 745.4, troponin 370,  ?ECG demonstrated sinus rhythm, LVH, nonspecific TWI ? ?Past Medical History:  ?Diagnosis Date  ? Cardiac arrest [facility])   ? CHF (congestive heart failure) ([place])   ? Coronary artery disease   ? Diabetes mellitus without complication ([place])   ? H/O heart artery stent   ? 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  ? ? ?Past Surgical History:  ?Procedure Laterality Date  ? CARDIAC CATHETERIZATION    ? ICD IMPLANT    ? ? ?Inpatient Medications: ?Scheduled Meds: ? [START ON 01/30/2022]  aspirin EC  81 mg Oral Daily  ? [START ON 01/30/2022] atorvastatin  20 mg Oral Daily  ? [START ON 01/30/2022] clopidogrel  75 mg Oral Daily  ? [START ON 01/30/2022] empagliflozin  10 mg Oral QAC breakfast  ? [START ON 01/30/2022] enoxaparin (LOVENOX) injection  40 mg Subcutaneous Q24H  ? furosemide  40 mg Intravenous Q12H  ? [START ON 01/30/2022] insulin aspart  0-9 Units Subcutaneous TID WC  ? [START ON 01/30/2022] lisinopril  40 mg Oral Daily  ? [START ON 01/30/2022] metoprolol succinate  50 mg Oral Daily  ? sodium chloride flush  3 mL Intravenous Q12H  ? ?Continuous Infusions: ? ?PRN Meds: ?acetaminophen **OR** acetaminophen, ondansetron **OR** ondansetron (ZOFRAN) IV, senna-docusate ? ?Allergies:   No Known Allergies ? ?Social History:   ?Social History  ? ?Socioeconomic History  ? Marital status: Single  ?  Spouse name: Not on file  ? Number of children: Not on file  ? Years of education: Not on file  ? Highest education level: Not on file  ?Occupational History  ? Not on file  ?Tobacco Use  ? Smoking status: Never  ?  Passive exposure: Never  ? Smokeless tobacco: Never  ?Substance and Sexual Activity  ? Alcohol use: Never  ? Drug use: Never  ? Sexual activity: Not Currently  ?  Partners: Female  ?Other Topics Concern  ? Not on file  ?Social History Narrative  ? Not on file  ? ?Social Determinants  of Health  ? ?Financial Resource Strain: Medium Risk  ? Difficulty of Paying Living Expenses: Somewhat hard  ?Food Insecurity: Not on file  ?Transportation Needs: No Transportation Needs  ? Lack of Transportation (Medical): No  ? Lack of Transportation (Non-Medical): No  ?Physical Activity: Not on file  ?Stress: Not on file  ?Social Connections: Not on file  ?Intimate Partner Violence: Not on file  ?  ?Family History:   ? ?Family History  ?Problem Relation Age of Onset  ? Diabetes Mother   ? Diabetes Brother   ?  ? ?ROS:  ?Please see the history of present illness.  ? ?All other ROS reviewed and negative.    ? ?Physical  Exam/Data:  ? ?Vitals:  ? 01/29/22 2200 01/29/22 2300 01/29/22 2315 01/29/22 2327  ?BP: (!) 148/91 121/64 (!) 142/80   ?Pulse: 69 62 70 65  ?Resp: (!) 25 17 (!) 23 (!) 22  ?Temp:      ?TempSrc:      ?SpO2: 98% 94% 96% 94%  ?Weight:    70.8 kg  ? ?No intake or output data in the 24 hours ending 01/29/22 2337 ? ?  01/29/2022  ? 11:27 PM 01/07/2022  ? 11:08 AM 12/05/2021  ?  8:31 AM  ?Last 3 Weights  ?Weight (lbs) 156 lb 156 lb 6.4 oz 154 lb 3.2 oz  ?Weight (kg) 70.761 kg 70.943 kg 69.945 kg  ?   ?Body mass index is 25.18 kg/m?.  ?General:  Well nourished, well developed, in no acute distress ?HEENT: normal ?Neck: no JVD ?Vascular: No carotid bruits; Distal pulses 2+ bilaterally ?Cardiac:  normal S1, S2; RRR; no murmur  ?Lungs:  rales present ?Abd: soft, nontender, no hepatomegaly  ?Ext: no edema ?Musculoskeletal:  No deformities, BUE and BLE strength normal and equal ?Skin: warm and dry  ?Neuro:  CNs 2-12 intact, no focal abnormalities noted ?Psych:  Normal affect  ? ? ?Relevant CV Studies: ?TTE 12/16/2021 ?1. Left ventricular ejection fraction, by estimation, is 30 to 35%. The  ?left ventricle has moderately decreased function. The left ventricle  ?demonstrates global hypokinesis. The left ventricular internal cavity size was mildly dilated. There is mild left ventricular hypertrophy. Left ventricular diastolic parameters are indeterminate.  ? 2. Right ventricular systolic function is normal. The right ventricular  ?size is normal. Tricuspid regurgitation signal is inadequate for assessing PA pressure.  ? 3. Left atrial size was mildly dilated.  ? 4. The mitral valve is grossly normal. No evidence of mitral valve  ?regurgitation.  ? 5. The aortic valve is grossly normal. Aortic valve regurgitation is not visualized.  ? ?Laboratory Data: ? ?High Sensitivity Troponin:   ?Recent Labs  ?Lab 01/29/22 ?1955  ?TROPONINIHS 370*  ?   ?Chemistry ?Recent Labs  ?Lab 01/29/22 ?[id number]  ?NA 137  ?K 4.7  ?CL 108  ?CO2 21*  ?GLUCOSE 218*   ?BUN 25*  ?CREATININE 1.26*  ?CALCIUM 9.0  ?GFRNONAA >60  ?ANIONGAP 8  ?  ?No results for input(s): PROT, ALBUMIN, AST, ALT, ALKPHOS, BILITOT in the last 168 hours. ?Lipids No results for input(s): CHOL, TRIG, HDL, LABVLDL, LDLCALC, CHOLHDL in the last 168 hours.  ?Hematology ?Recent Labs  ?Lab 01/29/22 ?[id number]  ?WBC 19.7*  ?RBC 5.04  ?HGB 14.4  ?HCT 46.2  ?MCV 91.7  ?MCH 28.6  ?MCHC 31.2  ?RDW 13.1  ?PLT 232  ? ?Thyroid No results for input(s): TSH, FREET4 in the last 168 hours.  ?BNP ?Recent Labs  ?Lab 01/29/22 ?1918  ?  BNP 745.4*  ?  ?DDimer No results for input(s): DDIMER in the last 168 hours. ? ? ?Radiology/Studies:  ?DG Chest 2 View ? ?Result Date: 01/29/2022 ?CLINICAL DATA:  Shortness of breath EXAM: CHEST - 2 VIEW COMPARISON:  No comparison studies available. FINDINGS: The cardiopericardial silhouette is within normal limits for size. Diffuse interstitial opacity suggests edema. Tiny pleural effusions noted on the lateral projection. Left-sided permanent pacemaker/AICD noted. IMPRESSION: Interstitial pulmonary edema pattern with tiny bilateral pleural effusions. Electronically Signed   By: Misty Stanley M.D.   On: 01/29/2022 07:54   ? ? ?Assessment and Plan:  ? ?#HFrEF (LVEF 30-35% 11/2021) ?#ICM ?-Patient is volume overload on exam (rales), elevated BNP (unkown baseline) and CXR findings ?-continue his home GDMT ?-start lasix IV 40mg  tonight ?-strict I/O, daily weight/UOP, replete electrolytes as needed ?-acquire a TTE ? ?#hx of CAD s/p shockwave and DES to mLAD and diag 04/2021 ?-EKG prn ?-continue to trend troponin until peaks ?-continue ASA, statin and plavix ?-keep NPO, will need a coronary angiogram ? ?#hx of VT s/p p DC Medtronic ICD 05/20/2021 ?-denies ICD shock ?-continue to monitor  ?-continue home metoprol ?Risk Assessment/Risk Scores:  ? New York Heart Association (NYHA) Functional Class ?NYHA Class II ?    ?For questions or updates, please contact [place] ?Please consult [email or web address] for  contact info under  ? ? ?Signed, ?Laurice Record, MD  ?01/29/2022 11:37 PM  ?

## 2022-01-29 NOTE — Hospital Course (Addendum)
Pedro Kemp was admitted to the hospital with the working diagnosis of decompensated heart failure.  ? ?66 yo male spanish-speaking with medical history significant for HFrEF (EF 30-35% by TTE 12/16/2021), CAD s/p DES to mid LAD (04/2021), history of VT arrest s/p ICD, T2DM, HTN, HLD who presented with dyspnea. Reported acute onset of dyspnea on 04/13 at 6 am, that prompted him to come to the ED. He did had dyspnea on exertion for 24 hrs into his hospitalization. His diuretic therapy was discontinued on 10/2021 per his primary care provider. On his initial physical examination his blood pressure was 175/93, HR 96, RR 22, 02 saturation 91% on room air, lungs with bilateral rales, heart with S1 and S2 present and rhythmic with no gallops, abdomen not distended and positive lower extremity edema.  ? ?Na 137, K 4,7  Cl 108, bicarbonate 21, glucose 218, bun 25 cr 1,26 ?BNP 746  ?High sensitive troponin 370 and 427  ?Wbc 19,7 hgb 14,4 plt 232  ? ?Chest radiograph with cardiomegaly, with bilateral interstitial infiltrates at the lower lobes, with hilar vascular congestion. PPM dual chamber with defibrillator.  ? ?EKG 98 bpm, left axis deviation, left anterior fascicular block, with sinus rhythm, no significant ST segment changes, negative T wave lead AvL, lead I, V4 to V6.  ? ?Patient was placed on furosemide for diuresis.  ?

## 2022-01-29 NOTE — ED Triage Notes (Signed)
Pt with sudden onset sob after arriving to work at 0600 today. SpO2 80% room air and placed on NRB by EMS. Denies chest pain.  ?

## 2022-01-29 NOTE — ED Notes (Signed)
Pulse ox while ambulating remained above 97% on room air, respirations increased to 30-40, HR remained stable ?

## 2022-01-29 NOTE — ED Provider Notes (Signed)
MDM:  ?Patient is a 66 y.o. male with a history of type 2 diabetes, CHF (recent EF in the 30s), hypertension who presents to the emergency department for acute onset shortness of breath that lasted 1 hour and has since resolved.  In triage, patient was found to be hypoxic and was put on 4 L oxygen.  On my exam, patient does have good movement of air in bilateral lungs.  No wheezing lower lung fields with cracking bilaterally.  I have removed him from oxygen and he is oxygenating well above 95%.  No sign of respiratory distress. ? ?Patient presentation is concerning for progressively worsening CHF.  Other differential include pneumonia, ACS etiology and PE (however this is less likely at this time).  ? ?Patient CBC with leukocytosis at 19.7.  Unclear etiology at this time.  BMP remarkable for mildly elevated glucose and a creatinine of 1.26 which is close to his baseline.  BNP elevated at 745 and a troponin of 370.  EKG PE with left ventricular hypertrophy but normal sinus rhythm.  No sign of ischemic etiology at this time.  No Brugada or Wellens. ? ?Based on the above finding, spoke with cardiology given his elevated troponin and due to concern for possible NSTEMI.  Cardiology recommend admission to medicine and believe this is most likely secondary to demand ischemia.  They will follow-up with the admitting team and discuss the possibility of heparin treatment. ? ?Patient and family given my usual and customary discussion regarding hospital admission and anticipated plan of care. Patient and family verbalized understanding. ? ?Care of this patient was overseen by the attending physician of record for this patient who verbalized agreement with the plan. ? ?I performed chart review as discussed in the MDM ? ?Medical Decision Making ?Problems Addressed: ?Dyspnea, unspecified type: acute illness or injury that poses a threat to life or bodily functions ?Elevated troponin: acute illness or injury that poses a threat to  life or bodily functions ? ?Amount and/or Complexity of Data Reviewed ?Labs: ordered. Decision-making details documented in ED Course. ?Radiology: ordered and independent interpretation performed. Decision-making details documented in ED Course. ? ?Risk ?Prescription drug management. ?Decision regarding hospitalization. ? ? ? ?HPI: ? ?Patient is a 66 y.o. male with a history of type 2 diabetes, CHF (recent EF in the 30s), hypertension who presents to the emergency department for acute onset shortness of breath that lasted 1 hour and has since resolved.  He denies associated chest pain.  Denies cough or congestion.  Denies fever.  You present to the ED because the shortness of breath was intense when it started.  Reports being compliant with his medication.  He does have an ICD placed but does not remember why or when.  Denies any trauma to his chest.  Denies vomiting or diarrhea.  Denies any urinary symptoms.  He reports symptoms completely resolved now and feels he is able to breathe without a problem.  Otherwise no other complaints. ? ?ROS included in the HPI. ?Please see MAR for past medical history, surgical history, and social history. ? ?Past Medical History:  ?Diagnosis Date  ? Cardiac arrest [facility])   ? CHF (congestive heart failure) ([facility])   ? Coronary artery disease   ? Diabetes mellitus without complication ([facility])   ? H/O heart artery stent   ? 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  ? ? ?Vitals:  ? 01/30/22 0000 01/30/22 0025  ?BP: 129/63 (!) 144/64  ?Pulse:  65 66  ?Resp: (!) 22 (!) 23  ?Temp:    ?SpO2: 93% 95%  ? ? ?Physical Exam: ?General: Well appearing, nontoxic, no acute distress; ?Head: Normocephalic Atraumatic; ?Eyes: PERRL, EOMI; ?ENT: Airway patent, no stridor; patent oropharynx w/o erythema or exudate, uvula midline ?Neck: supple, no meningismus; trachea midline; ?Chest: Lower lung field crackles bilaterally. ?Cardiac: Regular rate and rhythm, 2+pulses bilaterally in  upper and lower extremities ?Abdomen: soft, nontender, nondistended; no guarding, rebound, or tenderness to percussion; ?Musculoskeletal:  Good muscle tone in upper and lower extremities, 5/5 strength ?Skin: No rash, normal skin tone; no clubbing, no cyanosis, no edema ?Neuro: Alert; No focal deficit, CN 2-12 symmetric and grossly intact; sensation intact in upper and lower extremities ? ? ? ?Procedures ? ?DG Chest 2 View  ?Final Result  ?  ? ? ?  ? ? ?Clinical Impression:  ?1. Dyspnea, unspecified type   ?2. Elevated troponin   ? ? ?Admit ? ? ?  ?Jari Sportsman, MD ?01/30/22 0034 ? ?  ?Milagros Loll, MD ?02/01/22 2019 ? ?

## 2022-01-30 DIAGNOSIS — E1169 Type 2 diabetes mellitus with other specified complication: Secondary | ICD-10-CM

## 2022-01-30 DIAGNOSIS — E119 Type 2 diabetes mellitus without complications: Secondary | ICD-10-CM

## 2022-01-30 DIAGNOSIS — I10 Essential (primary) hypertension: Secondary | ICD-10-CM

## 2022-01-30 DIAGNOSIS — E785 Hyperlipidemia, unspecified: Secondary | ICD-10-CM

## 2022-01-30 DIAGNOSIS — D72829 Elevated white blood cell count, unspecified: Secondary | ICD-10-CM

## 2022-01-30 LAB — CBC WITH DIFFERENTIAL/PLATELET
Abs Immature Granulocytes: 0.03 10*3/uL (ref 0.00–0.07)
Basophils Absolute: 0.1 10*3/uL (ref 0.0–0.1)
Basophils Relative: 1 %
Eosinophils Absolute: 1.8 10*3/uL — ABNORMAL HIGH (ref 0.0–0.5)
Eosinophils Relative: 16 %
HCT: 40.7 % (ref 39.0–52.0)
Hemoglobin: 13.1 g/dL (ref 13.0–17.0)
Immature Granulocytes: 0 %
Lymphocytes Relative: 18 %
Lymphs Abs: 2 10*3/uL (ref 0.7–4.0)
MCH: 28.7 pg (ref 26.0–34.0)
MCHC: 32.2 g/dL (ref 30.0–36.0)
MCV: 89.3 fL (ref 80.0–100.0)
Monocytes Absolute: 0.6 10*3/uL (ref 0.1–1.0)
Monocytes Relative: 5 %
Neutro Abs: 6.8 10*3/uL (ref 1.7–7.7)
Neutrophils Relative %: 60 %
Platelets: 180 10*3/uL (ref 150–400)
RBC: 4.56 MIL/uL (ref 4.22–5.81)
RDW: 13.2 % (ref 11.5–15.5)
WBC: 11.2 10*3/uL — ABNORMAL HIGH (ref 4.0–10.5)
nRBC: 0 % (ref 0.0–0.2)

## 2022-01-30 LAB — BASIC METABOLIC PANEL
Anion gap: 5 (ref 5–15)
BUN: 22 mg/dL (ref 8–23)
CO2: 23 mmol/L (ref 22–32)
Calcium: 9.1 mg/dL (ref 8.9–10.3)
Chloride: 109 mmol/L (ref 98–111)
Creatinine, Ser: 1.05 mg/dL (ref 0.61–1.24)
GFR, Estimated: >60 mL/min (ref >60)
Glucose, Bld: 164 mg/dL — ABNORMAL HIGH (ref 70–99)
Potassium: 4.1 mmol/L (ref 3.5–5.1)
Sodium: 137 mmol/L (ref 135–145)

## 2022-01-30 LAB — GLUCOSE, CAPILLARY
Glucose-Capillary: 108 mg/dL — ABNORMAL HIGH (ref 70–99)
Glucose-Capillary: 166 mg/dL — ABNORMAL HIGH (ref 70–99)

## 2022-01-30 LAB — TROPONIN I (HIGH SENSITIVITY)
Troponin I (High Sensitivity): 427 ng/L (ref <18)
Troponin I (High Sensitivity): 422 ng/L (ref <18)

## 2022-01-30 LAB — TECHNOLOGIST SMEAR REVIEW

## 2022-01-30 LAB — HIV ANTIBODY (ROUTINE TESTING W REFLEX): HIV Screen 4th Generation wRfx: NONREACTIVE

## 2022-01-30 LAB — MAGNESIUM: Magnesium: 1.9 mg/dL (ref 1.7–2.4)

## 2022-01-30 LAB — CBG MONITORING, ED: Glucose-Capillary: 238 mg/dL — ABNORMAL HIGH (ref 70–99)

## 2022-01-30 MED ORDER — SPIRONOLACTONE 12.5 MG HALF TABLET
12.5000 mg | ORAL_TABLET | Freq: Every day | ORAL | Status: DC
  Administered 2022-01-31: 12.5 mg via ORAL
  Filled 2022-01-30: qty 1

## 2022-01-30 MED ORDER — LOSARTAN POTASSIUM 50 MG PO TABS
50.0000 mg | ORAL_TABLET | Freq: Every day | ORAL | Status: DC
  Administered 2022-01-31: 50 mg via ORAL
  Filled 2022-01-30: qty 1

## 2022-01-30 MED ORDER — FUROSEMIDE 40 MG PO TABS
40.0000 mg | ORAL_TABLET | Freq: Every day | ORAL | Status: DC
  Administered 2022-01-31: 40 mg via ORAL
  Filled 2022-01-30: qty 1

## 2022-01-30 NOTE — ED Notes (Signed)
Pt placed on 2L O2 

## 2022-01-30 NOTE — Assessment & Plan Note (Signed)
Continue atorvastatin

## 2022-01-30 NOTE — Assessment & Plan Note (Addendum)
Echocardiogram from 11/2021 with LV systolic function reduced to 30 to 35%, with global hypokinesis, RV systolic function preserved. No significant valvular disease.  ? ?Urine output 1,700 ml over last 24 hrs ?Systolic blood pressure 140 to 150 mmHg.  ? ?Plan to continue diuresis with furosemide 40 mg daily.  ?Change ace inh to ABR. ?Continue B blockade with metoprolol succinate ?Continue with spironolactone.   ?

## 2022-01-30 NOTE — Assessment & Plan Note (Addendum)
Blood pressure controlled.  ?Plan to continue with metoprolol, spironolactone and furosemide. ?Transition to ARB from ace inh.  ?

## 2022-01-30 NOTE — Assessment & Plan Note (Signed)
History of VT arrest s/p ICD. ?

## 2022-01-30 NOTE — Assessment & Plan Note (Addendum)
Wbc is 11.2 likely reactive leukocytosis. ?No clinical signs of systemic bacterial infection, continue to hold on antibiotic therapy.  ?

## 2022-01-30 NOTE — Assessment & Plan Note (Addendum)
Fasting glucose this am is 164. ? ?Plan to continue insulin sliding scale for glucose cover and monitoring ?Advance diet today.  ?

## 2022-01-30 NOTE — Progress Notes (Signed)
Heart Failure Navigator Progress Note ? ?Following this hospitalization to assess for HV TOC readiness.  ? ?EF 30-35% ?ICD ?HFrEF ? ?Rhae Hammock, BSN, RN ?Heart Failure Nurse Navigator ?Secure Chat Only  ?

## 2022-01-30 NOTE — Assessment & Plan Note (Addendum)
S/p DES to mid LAD 04/2021 ?Elevated troponin ?Elevated troponin due to heart failure exacerbation, no clinical signs of acute coronary syndrome.  ?

## 2022-01-30 NOTE — Plan of Care (Signed)

## 2022-01-30 NOTE — Progress Notes (Signed)
? ?Progress Note ? ?Patient Name: Pedro Kemp ?Date of Encounter: 01/30/2022 ? ?[facility] Cardiologist: Janina Mayo, MD  ? ?Subjective  ? ?Breathing is better   Back to baseline  No CP  ? ?Inpatient Medications  ?  ?Scheduled Meds: ? aspirin EC  81 mg Oral Daily  ? atorvastatin  20 mg Oral Daily  ? clopidogrel  75 mg Oral Daily  ? empagliflozin  10 mg Oral QAC breakfast  ? enoxaparin (LOVENOX) injection  40 mg Subcutaneous Q24H  ? furosemide  40 mg Intravenous Q12H  ? insulin aspart  0-9 Units Subcutaneous TID WC  ? lisinopril  40 mg Oral Daily  ? metoprolol succinate  50 mg Oral Daily  ? sodium chloride flush  3 mL Intravenous Q12H  ? ?Continuous Infusions: ? ?PRN Meds: ?acetaminophen **OR** acetaminophen, ondansetron **OR** ondansetron (ZOFRAN) IV, senna-docusate  ? ?Vital Signs  ?  ?Vitals:  ? 01/30/22 0415 01/30/22 0500 01/30/22 0515 01/30/22 0615  ?BP: 114/85 126/65 120/65 135/72  ?Pulse: (!) 59 64 60 62  ?Resp: 18 20 20 20   ?Temp:      ?TempSrc:      ?SpO2: 95% 95% 95% 95%  ?Weight:      ? ? ?Intake/Output Summary (Last 24 hours) at 01/30/2022 0743 ?Last data filed at 01/30/2022 0315 ?Gross per 24 hour  ?Intake --  ?Output 1700 ml  ?Net -1700 ml  ? ? ?  01/29/2022  ? 11:27 PM 01/07/2022  ? 11:08 AM 12/05/2021  ?  8:31 AM  ?Last 3 Weights  ?Weight (lbs) 156 lb 156 lb 6.4 oz 154 lb 3.2 oz  ?Weight (kg) 70.761 kg 70.943 kg 69.945 kg  ?   ? ?Telemetry  ?  ?Sr  - Personally Reviewed ? ?ECG  ?  ? Ne new   Personally Reviewed ? ?Physical Exam  ? ?GEN: No acute distress.   ?Neck: No JVD ?Cardiac: RRR, no murmurs, rubs, or gallops.  ?Respiratory: Clear to auscultation bilaterally. ?GI: Soft, nontender, non-distended  ?MS: No edema; No deformity. ?Neuro:  Nonfocal  ?Psych: Normal affect  ? ?Labs  ?  ?High Sensitivity Troponin:   ?Recent Labs  ?Lab 01/29/22 ?1955 01/29/22 ?2326 01/30/22 ?0530  ?TROPONINIHS 370* 427* 422*  ?   ?Chemistry ?Recent Labs  ?Lab 01/29/22 ?0734 01/30/22 ?0245  ?NA 137 137  ?K 4.7 4.1   ?CL 108 109  ?CO2 21* 23  ?GLUCOSE 218* 164*  ?BUN 25* 22  ?CREATININE 1.26* 1.05  ?CALCIUM 9.0 9.1  ?MG  --  1.9  ?GFRNONAA >60 >60  ?ANIONGAP 8 5  ?  ?Lipids No results for input(s): CHOL, TRIG, HDL, LABVLDL, LDLCALC, CHOLHDL in the last 168 hours.  ?Hematology ?Recent Labs  ?Lab 01/29/22 ?0734 01/30/22 ?0245  ?WBC 19.7* 11.2*  ?RBC 5.04 4.56  ?HGB 14.4 13.1  ?HCT 46.2 40.7  ?MCV 91.7 89.3  ?MCH 28.6 28.7  ?MCHC 31.2 32.2  ?RDW 13.1 13.2  ?PLT 232 180  ? ?Thyroid No results for input(s): TSH, FREET4 in the last 168 hours.  ?BNP ?Recent Labs  ?Lab 01/29/22 ?1918  ?BNP 745.4*  ?  ?DDimer No results for input(s): DDIMER in the last 168 hours.  ? ?Radiology  ?  ?DG Chest 2 View ? ?Result Date: 01/29/2022 ?CLINICAL DATA:  Shortness of breath EXAM: CHEST - 2 VIEW COMPARISON:  No comparison studies available. FINDINGS: The cardiopericardial silhouette is within normal limits for size. Diffuse interstitial opacity suggests edema. Tiny pleural effusions noted on  the lateral projection. Left-sided permanent pacemaker/AICD noted. IMPRESSION: Interstitial pulmonary edema pattern with tiny bilateral pleural effusions. Electronically Signed   By: Misty Stanley M.D.   On: 01/29/2022 07:54   ? ?Cardiac Studies  ? Echo   12/16/21 ? ? ?1. Left ventricular ejection fraction, by estimation, is 30 to 35%. The  ?left ventricle has moderately decreased function. The left ventricle  ?demonstrates global hypokinesis. The left ventricular internal cavity size  ?was mildly dilated. There is mild  ?left ventricular hypertrophy. Left ventricular diastolic parameters are  ?indeterminate.  ? 2. Right ventricular systolic function is normal. The right ventricular  ?size is normal. Tricuspid regurgitation signal is inadequate for assessing  ?PA pressure.  ? 3. Left atrial size was mildly dilated.  ? 4. The mitral valve is grossly normal. No evidence of mitral valve  ?regurgitation.  ? 5. The aortic valve is grossly normal. Aortic valve  regurgitation is not  ?visualized.  ? ?CATH  7/27 2022  ?LM calcified ?LAD 100% mid   Faint collat to distal vessel ?D1 90% with thrombus ?LCx small ?OM1 100% ?RCA dominant ? ?Sp shocktherapy and PTCA/DEs to LAD ?S/p PTCA to D1 ? ?Repeat cath 05/16/21 ? ?LAD 50 to 60% prox   Patent stent ?50 to 70% ostial D1   ? ? ?Patient Profile  ?   ?Pedro Kemp is a 66 y.o. male with a hx of CAD s/p shockwave and DES to mLAD and diag 04/2021, hx of VT s/p p DC [employer] ICD 05/20/2021, ICM, HFrEF (30-35%) T2DM, HLD and CKD who is being seen 01/29/2022 for the evaluation of shortness of breath at the request of Dr. Tobe Sos. ? ?Assessment & Plan  ?  ?1  HFrEF  Pt with ICM   LVEF 30 to 35%.     Pt has diuresed signifcantly since admit   Says he is at baseline    ?Denies any change in diet   Says he is watching salt intake  ?Weigh daily   ?Currently on lasix IV, ASA, lisinopril, Toprol XL, jardiance  ?He was not on lasix at home   Would start 40 every day ?Add spironolactone 12.5   ?BMET this coming week     ? ?Would be good to get Entresto,  ? If can afford   WWill need to be off of lisinorpil    Follow as outpt   ? ?2  Hx CAD  Last intervention s/p shockwave and DES to m LAD and diag 04/2021. ([facility])    Pt without signficant CP     Trop with triviall elevation that is flat 370, 427, 422.  Could be explained by demand in setting of known CAD     ?He feels good now    Plan to ambulate   I would not plan cath  ?Cotinue ASA and plavix  ?Ambulate    If feels oK tomorrow then d/c ? ?3  Hx VT   Had EP evaluation at [facility] rx   Monomorphic VT fekt scar neduated    s/ p dual chamber ICD  (medtronic)  COmplicated by hematoma   Follow   No VT  ? ?4 HL  Keep on statin  ? ?5  HTN  Pt followed for CHF and HTN in pharmacy   Seen on 01/07/22   has f/u appt     ?Will add spironolactone  ? ?Patient has appt with Pharmacist on 4/19 at our [facility]   Can get labs at that time    ? ?  Will sign off    ? ?For questions or updates, please contact  [place] ?Please consult [email or web address] for contact info under  ? ?  ?   ?Signed, ?Dorris Carnes, MD  ?01/30/2022, 7:43 AM   ? ?

## 2022-01-30 NOTE — Progress Notes (Signed)
?Progress Note ? ? ?PatientHaile Kemp EZM:[id number] DOB: [birth year] DOA: 01/29/2022     1 ?DOS: the patient was seen and examined on 01/30/2022 ?  ?Brief hospital course: ?Mr.Pedro Kemp was admitted to the hospital with the working diagnosis of decompensated heart failure.  ? ?66 yo male spanish-speaking with medical history significant for HFrEF (EF 30-35% by TTE 12/16/2021), CAD s/p DES to mid LAD (04/2021), history of VT arrest s/p ICD, T2DM, HTN, HLD who presented with dyspnea. Reported acute onset of dyspnea on 04/13 at 6 am, that prompted him to come to the ED. He did had dyspnea on exertion for 24 hrs into his hospitalization. His diuretic therapy was discontinued on 10/2021 per his primary care provider. On his initial physical examination his blood pressure was 175/93, HR 96, RR 22, 02 saturation 91% on room air, lungs with bilateral rales, heart with S1 and S2 present and rhythmic with no gallops, abdomen not distended and positive lower extremity edema.  ? ?Na 137, K 4,7  Cl 108, bicarbonate 21, glucose 218, bun 25 cr 1,26 ?BNP 746  ?High sensitive troponin 370 and 427  ?Wbc 19,7 hgb 14,4 plt 232  ? ?Chest radiograph with cardiomegaly, with bilateral interstitial infiltrates at the lower lobes, with hilar vascular congestion. PPM dual chamber with defibrillator.  ? ?EKG 98 bpm, left axis deviation, left anterior fascicular block, with sinus rhythm, no significant ST segment changes, negative T wave lead AvL, lead I, V4 to V6.  ? ?Patient was placed on furosemide for diuresis.  ? ?Assessment and Plan: ?* Acute on chronic HFrEF (heart failure with reduced ejection fraction) (HCC) ?Echocardiogram from 11/2021 with LV systolic function reduced to 30 to 35%, with global hypokinesis, RV systolic function preserved. No significant valvular disease.  ? ?Urine output 1,700 ml over last 24 hrs ?Systolic blood pressure 140 to 150 mmHg.  ? ?Plan to continue diuresis with furosemide 40 mg daily.  ?Change ace  inh to ABR. ?Continue B blockade with metoprolol succinate ?Continue with spironolactone.   ? ?Coronary artery disease ?S/p DES to mid LAD 04/2021 ?Elevated troponin ?Elevated troponin due to heart failure exacerbation, no clinical signs of acute coronary syndrome.  ? ?Essential hypertension ?Blood pressure controlled.  ?Plan to continue with metoprolol, spironolactone and furosemide. ?Transition to ARB from ace inh.  ? ?Type 2 diabetes mellitus without complication, without long-term current use of insulin (HCC) ?Fasting glucose this am is 164. ? ?Plan to continue insulin sliding scale for glucose cover and monitoring ?Advance diet today.  ? ?Leukocytosis ?Wbc is 11.2 likely reactive leukocytosis. ?No clinical signs of systemic bacterial infection, continue to hold on antibiotic therapy.  ? ?History of cardiac arrest ?History of VT arrest s/p ICD. ? ?Hyperlipidemia associated with type 2 diabetes mellitus (HCC) ?Continue atorvastatin. ? ? ? ? ?  ? ?Subjective: patient is feeling better, no nausea or vomiting, no chest pain, dyspnea has improved.  ? ?Physical Exam: ?Vitals:  ? 01/30/22 1430 01/30/22 1441 01/30/22 1500 01/30/22 1513  ?BP: 140/70  (!) 159/88   ?Pulse: 66  74   ?Resp:  16 17   ?Temp:  98 ?F (36.7 ?C) 98 ?F (36.7 ?C)   ?TempSrc:  Oral Oral   ?SpO2: 95%  99%   ?Weight:   68.4 kg   ?Height:   5\' 5"  (1.651 m) 5\' 5"  (1.651 m)  ? ?Neurology awake and alert ?ENT with no pallor or icterus ?Cardiovascular with S1 and S2 present and rhythmic with no gallops or  murmurs. No rubs ?No JVD ?No lower extremity edema ?Respiratory with no rales or wheezing ?Abdomen soft and not distended  ?Data Reviewed: ? ? ? ?Family Communication: I spoke with patient's sister at the bedside, we talked in detail about patient's condition, plan of care and prognosis and all questions were addressed. ? ? ?Disposition: ?Status is: Inpatient ?Remains inpatient appropriate because: heart failure  ? Planned Discharge Destination:  Home ? ?Author: ?Pedro Keens, MD ?01/30/2022 4:57 PM ? ?For on call review [email or web address].  ?

## 2022-01-31 LAB — BASIC METABOLIC PANEL
Anion gap: 8 (ref 5–15)
BUN: 26 mg/dL — ABNORMAL HIGH (ref 8–23)
CO2: 23 mmol/L (ref 22–32)
Calcium: 8.9 mg/dL (ref 8.9–10.3)
Chloride: 109 mmol/L (ref 98–111)
Creatinine, Ser: 1.23 mg/dL (ref 0.61–1.24)
GFR, Estimated: >60 mL/min (ref >60)
Glucose, Bld: 112 mg/dL — ABNORMAL HIGH (ref 70–99)
Potassium: 4.3 mmol/L (ref 3.5–5.1)
Sodium: 140 mmol/L (ref 135–145)

## 2022-01-31 LAB — GLUCOSE, CAPILLARY: Glucose-Capillary: 120 mg/dL — ABNORMAL HIGH (ref 70–99)

## 2022-01-31 MED ORDER — SPIRONOLACTONE 25 MG PO TABS: 12.5000 mg | ORAL_TABLET | Freq: Every day | ORAL | 0 refills | Status: DC

## 2022-01-31 MED ORDER — FUROSEMIDE 40 MG PO TABS: 40.0000 mg | ORAL_TABLET | Freq: Every day | ORAL | 1 refills | Status: DC

## 2022-01-31 MED ORDER — LOSARTAN POTASSIUM 50 MG PO TABS: 50.0000 mg | ORAL_TABLET | Freq: Every day | ORAL | 0 refills | Status: DC

## 2022-01-31 NOTE — TOC Transition Note (Signed)
Transition of Care (TOC) - CM/SW Discharge Note ? ? ?Patient Details  ?Name: Pedro Kemp ?MRN: [id number] ?Date of Birth: Nov 24, 1955 ? ?Transition of Care (TOC) CM/SW Contact:  ?Lawerance Sabal, RN ?Phone Number: ?01/31/2022, 8:58 AM ? ? ?Clinical Narrative:    ?Patient active with [facility]. Added reminder to schedule follow up with PCP to AVS (appointment cannot be scheduled on the weekend). ?Medication costs <$25 per good Rx.  ?Patient has follow ups scheduled with [facility] pharmacist on 4/19 and office visit at [facility] w Carolan Clines MD on 5/17. ?No other TOC needs identified  ? ? ? ?  ?  ? ? ?Patient Goals and CMS Choice ?  ?  ?  ? ?Discharge Placement ?  ?           ?  ?  ?  ?  ? ?Discharge Plan and Services ?  ?  ?           ?  ?  ?  ?  ?  ?  ?  ?  ?  ?  ? ?Social Determinants of Health (SDOH) Interventions ?  ? ? ?Readmission Risk Interventions ?   ? View : No data to display.  ?  ?  ?  ? ? ? ? ? ?

## 2022-01-31 NOTE — Progress Notes (Signed)
Patient discharged: Home with family  Via: Wheelchair   Discharge paperwork given: to patient and family  Reviewed with teach back  IV and telemetry disconnected  Belongings given to patient    

## 2022-02-02 ENCOUNTER — Telehealth: Payer: Self-pay

## 2022-02-02 NOTE — Telephone Encounter (Signed)
Transition Care Management Follow-up Telephone Call ? ?Call completed with assistance of Spanish Interpreter # [id number]/[employer]. I spoke with patient's sister, Osker Mason.   DRP on file to speak with her.  ?Date of discharge and from where: 01/31/2022, [facility] ?How have you been since you were released from the hospital? She said he is doing better.  ?Any questions or concerns? No ? ?Items Reviewed: ?Did the pt receive and understand the discharge instructions provided? Yes  ?Medications obtained and verified? Yes  -She said he has all of the medications and she did not have any questions about the med regime.  ?Other? No  ?Any new allergies since your discharge? No  ?Dietary orders reviewed? Yes ?Do you have support at home? Yes  - he is alone during the day but family stays with him at night.  ? ?Home Care and Equipment/Supplies: ?Were home health services ordered? no ?If so, what is the name of the agency? N/a  ?Has the agency set up a time to come to the patient's home? not applicable ?Were any new equipment or medical supplies ordered?  No ?What is the name of the medical supply agency? N/a ?Were you able to get the supplies/equipment? not applicable ?Do you have any questions related to the use of the equipment or supplies? No ? ?Functional Questionnaire: (I = Independent and D = Dependent) ?ADLs: independent ? ? ? ?Follow up appointments reviewed: ? ?PCP Hospital f/u appt confirmed? Yes  Scheduled to see Gwinda Passe, NP - 02/09/2022,  ?Specialist Hospital f/u appt confirmed? Yes  Scheduled to see cardiology - 02/04/2022.  ?Are transportation arrangements needed? No  - his family provides transportation  ?If their condition worsens, is the pt aware to call PCP or go to the Emergency Dept.? Yes ?Was the patient provided with contact information for the PCP's office or ED? Yes ?Was to pt encouraged to call back with questions or concerns? Yes ? ?

## 2022-02-04 ENCOUNTER — Encounter: Payer: Self-pay | Admitting: Pharmacist

## 2022-02-04 ENCOUNTER — Ambulatory Visit (INDEPENDENT_AMBULATORY_CARE_PROVIDER_SITE_OTHER): Payer: Self-pay | Admitting: Pharmacist

## 2022-02-04 ENCOUNTER — Other Ambulatory Visit: Payer: Self-pay

## 2022-02-04 VITALS — BP 160/76 | HR 74 | Wt 153.2 lb

## 2022-02-04 DIAGNOSIS — E785 Hyperlipidemia, unspecified: Secondary | ICD-10-CM

## 2022-02-04 DIAGNOSIS — E1169 Type 2 diabetes mellitus with other specified complication: Secondary | ICD-10-CM

## 2022-02-04 DIAGNOSIS — E119 Type 2 diabetes mellitus without complications: Secondary | ICD-10-CM

## 2022-02-04 DIAGNOSIS — I509 Heart failure, unspecified: Secondary | ICD-10-CM

## 2022-02-04 DIAGNOSIS — I5023 Acute on chronic systolic (congestive) heart failure: Secondary | ICD-10-CM

## 2022-02-04 MED ORDER — EMPAGLIFLOZIN 10 MG PO TABS
10.0000 mg | ORAL_TABLET | Freq: Every day | ORAL | 1 refills | Status: DC
  Filled 2022-02-04: qty 30, 30d supply, fill #0

## 2022-02-04 MED ORDER — SPIRONOLACTONE 25 MG PO TABS
12.5000 mg | ORAL_TABLET | Freq: Every day | ORAL | 0 refills | Status: DC
  Filled 2022-02-04: qty 15, 30d supply, fill #0

## 2022-02-04 MED ORDER — EMPAGLIFLOZIN 10 MG PO TABS: 10.0000 mg | ORAL_TABLET | Freq: Every day | ORAL | 0 refills | Status: DC

## 2022-02-04 MED ORDER — LOSARTAN POTASSIUM 100 MG PO TABS
100.0000 mg | ORAL_TABLET | Freq: Every day | ORAL | 1 refills | Status: DC
  Filled 2022-02-04: qty 30, 30d supply, fill #0
  Filled 2022-03-23: qty 30, 30d supply, fill #1
  Filled 2022-04-20: qty 30, 30d supply, fill #2
  Filled 2022-05-27: qty 30, 30d supply, fill #3
  Filled 2022-06-29: qty 30, 30d supply, fill #4
  Filled 2022-08-07: qty 30, 30d supply, fill #5

## 2022-02-04 NOTE — Patient Instructions (Addendum)
It was good seeing you again ? ?I am glad you are feeling better ? ?We would like your blood pressure to be less than 130/80 ? ?We will increase your losartan to 100mg  once a day.  You can take two of your 50mg  tablets until you pick up your new refill. ? ?Continue your: ? ?Metoprolol 50mg  once a day ?Jardiance 10mg  once a day ?Spironolactone 25mg  1/2 tablet once a day ?Furosemide 40mg  once a day ? ?I will put an order in for you to update lab work at office ? ?I recommend purchasing a scale so you can begin to weigh yourself in the morning ? ?Continue to watch how much salt and sugar you are eating ? ?Please call with any questions! ? ? , PharmD, BCACP, CDCES, CPP ?[address] ?[place], , ?Phone: [phone], Fax: [phone]  ?

## 2022-02-04 NOTE — Progress Notes (Signed)
Patient ID: Pedro Kemp                 DOB: 12-Aug-1956                      MRN: [id number] ? ? ? ? ?HPI: ?Jonathon Tan is a 66 y.o. male referred by Dr. Wyline Mood to pharmacy clinic for HF medication management. PMH is significant for CHF. HTN, CAD, HLD, and T2DM. Most recent LVEF 30-35% on 30-35%.  Patient admitted on 01/29/22 for CHF exacerbation. ? ?Patient presents today with interpreter Myra. Is feeling much better post hospitalization.  Was switched to losartan in hospital from lisinopril and furosemide was added. Went to ED for SOB and trouble breathing but having no issues now.  ? ?Reports a strong appetitie.  Is not weighing himself or checking blood pressure at home but reports he checks his blood sugar in the morning.  This morning was 130.  Compliant with medications. ? ?Is not working right now however after he was discharged from the hospital he mowed his yard without incident. Works for a company that [employer].  ? ?Denies chest pain but has occasional dizziness.   ? ?Brought medications with him. Absent from medications was Jardiance. He does not know why.   ? ?Has first post discharge PCP visit on 02/09/22. Was started on spironolactone post discharge. No follow up labs scheduled yet.  Would like to know when he is cleared to return to work. ? ?Current CHF meds:  ?Jardiance 10mg  daily? ?Spironolactone 12.5mg  daily ?Losartan 50mg  daily ?Metoprolol 50mg  daily ?Furosemide 40mg  daily ? ?Previously tried: Lisinopril ?BP goal: <130/80 ? ?Wt Readings from Last 3 Encounters:  ?01/31/22 149 lb 4.8 oz (67.7 kg)  ?01/07/22 156 lb 6.4 oz (70.9 kg)  ?12/05/21 154 lb 3.2 oz (69.9 kg)  ? ?BP Readings from Last 3 Encounters:  ?01/31/22 139/79  ?01/07/22 (!) 156/92  ?12/05/21 (!) 160/84  ? ?Pulse Readings from Last 3 Encounters:  ?01/31/22 66  ?01/07/22 64  ?12/05/21 79  ? ? ?Renal function: ?Estimated Creatinine Clearance: 52.1 mL/min (by C-G formula based on SCr of 1.23 mg/dL). ? ?Past Medical  History:  ?Diagnosis Date  ? Cardiac arrest [facility])   ? CHF (congestive heart failure) (HCC)   ? Coronary artery disease   ? Diabetes mellitus without complication (HCC)   ? H/O heart artery stent   ? 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  ? ? ?Current Outpatient Medications on File Prior to Visit  ?Medication Sig Dispense Refill  ? aspirin EC 81 MG tablet Take 81 mg by mouth daily. Swallow whole.    ? atorvastatin (LIPITOR) 20 MG tablet Take 1 tablet (20 mg total) by mouth daily. 90 tablet 3  ? clopidogrel (PLAVIX) 75 MG tablet Take 1 tablet (75 mg total) by mouth daily. 90 tablet 1  ? empagliflozin (JARDIANCE) 10 MG TABS tablet Take 1 tablet (10 mg total) by mouth daily before breakfast. 90 tablet 1  ? furosemide (LASIX) 40 MG tablet Take 1 tablet (40 mg total) by mouth daily. 30 tablet 1  ? losartan (COZAAR) 50 MG tablet Take 1 tablet (50 mg total) by mouth daily. 30 tablet 0  ? metFORMIN (GLUCOPHAGE-XR) 500 MG 24 hr tablet Take 1 tablet (500 mg total) by mouth daily with breakfast. 90 tablet 1  ? metoprolol succinate (TOPROL-XL) 50 MG 24 hr tablet Take 1 tablet (50 mg total) by mouth  daily. 90 tablet 1  ? spironolactone (ALDACTONE) 25 MG tablet Take 0.5 tablets (12.5 mg total) by mouth daily. 30 tablet 0  ? ?No current facility-administered medications on file prior to visit.  ? ? ?No Known Allergies ? ? ?Assessment/Plan: ? ?1. CHF -  Patient BP in room 160/76 which is above goal of <130/80.  Patient confused regarding medications due to language barrier.  Advised he should not take losartan and lisinopril together.  Unclear why he does not have Jardiance with him. ? ?Since patient hypertensive, will increase losartan to 100mg  daily. Advised he can take two of his 50mg  tablets until he picks up his new prescription.  Will send new Rx for Jardiance but also give samples in case cost is an issue. Recommended he pick up prescriptions at [facility] rather than  [facility] as they will be more affordable.  Also gave aspirin samples.   ? ?Recommended he purchase a scale and begin weighing himself at home to check for signs of edema and fluid gain.  Patient voiced understanding.  Will place lab order for BMP to be drawn due to starting spironolactone and furosemide and advised to have drawn at PCP office.   ? ?Gave patient medication organizer to help him with medication compliance. Reports he has nieces and nephews who can understand medication instructions and help him with his . ? ?Has follow up with Dr [facility] in 1 month. ? ?Increase Losartan to 100mg  daily ?Continue: ?Jardiance 10mg  daily ?Spironolactone 12.5mg  daily ?Metoprolol 50mg  daily ?Furosemide 40mg  daily ?Check BMP at PCP ? ? ?

## 2022-02-05 ENCOUNTER — Other Ambulatory Visit: Payer: Self-pay

## 2022-02-09 ENCOUNTER — Other Ambulatory Visit: Payer: Self-pay

## 2022-02-09 ENCOUNTER — Other Ambulatory Visit: Payer: Self-pay | Admitting: Pharmacist

## 2022-02-09 ENCOUNTER — Ambulatory Visit (INDEPENDENT_AMBULATORY_CARE_PROVIDER_SITE_OTHER): Payer: Self-pay | Admitting: Primary Care

## 2022-02-09 ENCOUNTER — Encounter (INDEPENDENT_AMBULATORY_CARE_PROVIDER_SITE_OTHER): Payer: Self-pay | Admitting: Primary Care

## 2022-02-09 VITALS — BP 135/82 | HR 60 | Temp 97.5°F | Ht 65.0 in | Wt 150.4 lb

## 2022-02-09 DIAGNOSIS — Z1211 Encounter for screening for malignant neoplasm of colon: Secondary | ICD-10-CM

## 2022-02-09 DIAGNOSIS — E119 Type 2 diabetes mellitus without complications: Secondary | ICD-10-CM

## 2022-02-09 DIAGNOSIS — Z79899 Other long term (current) drug therapy: Secondary | ICD-10-CM

## 2022-02-09 LAB — POCT GLYCOSYLATED HEMOGLOBIN (HGB A1C): Hemoglobin A1C: 7.7 % — AB (ref 4.0–5.6)

## 2022-02-09 MED ORDER — GLIPIZIDE ER 10 MG PO TB24
10.0000 mg | ORAL_TABLET | Freq: Every day | ORAL | 1 refills | Status: DC
  Filled 2022-02-09: qty 90, 90d supply, fill #0

## 2022-02-09 NOTE — Progress Notes (Signed)
?Pedro Kemp ? ? ?Subjective:  ? Pedro Kemp is a 66 y.o. Hispanic male  (interpreter Pedro Kemp [id number])presents for hospital follow up and management of type 2 diabetes He presented to the emergency room on  01/29/22 presented for evaluation of dyspnea . Admit date to the hospital was 01/29/22, patient was discharged from the hospital on 01/31/22, patient was admitted for: Type 2 diabetes mellitus without complication, without long-term current use of insulin (HCC),Hyperlipidemia associated with type 2 diabetes mellitus ([place]), Essential hypertension, Acute on chronic HFrEF (heart failure with reduced ejection fraction) ([place]), Coronary artery disease,History of cardiac arrest and Leukocytosis. Current diabetic medications include oral agents (dual therapy): Jardiance 10mg  and metformin 500 XL mg daily.Current monitoring regimen: home blood tests - every 3 day ,Home blood sugar records: fasting range: 117-130 ?Any episodes of hypoglycemia? No. Denies polyuria, polydipsia or vision. Voices no problems or concerns  ? ?Past Medical History:  ?Diagnosis Date  ? Cardiac arrest [facility])   ? CHF (congestive heart failure) ([place])   ? Coronary artery disease   ? Diabetes mellitus without complication ([place])   ? H/O heart artery stent   ? 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  ?  ? ?No Known Allergies ? ?  ?Current Outpatient Medications on File Prior to Visit  ?Medication Sig Dispense Refill  ? aspirin EC 81 MG tablet Take 81 mg by mouth daily. Swallow whole.    ? atorvastatin (LIPITOR) 20 MG tablet Take 1 tablet (20 mg total) by mouth daily. 90 tablet 3  ? clopidogrel (PLAVIX) 75 MG tablet Take 1 tablet (75 mg total) by mouth daily. 90 tablet 1  ? empagliflozin (JARDIANCE) 10 MG TABS tablet Take 1 tablet (10 mg total) by mouth daily before breakfast. 90 tablet 1  ? empagliflozin (JARDIANCE) 10 MG TABS tablet Take 1 tablet (10 mg total) by mouth daily. 28 tablet 0  ? furosemide  (LASIX) 40 MG tablet Take 1 tablet (40 mg total) by mouth daily. 30 tablet 1  ? losartan (COZAAR) 100 MG tablet Take 1 tablet (100 mg total) by mouth daily. 90 tablet 1  ? metFORMIN (GLUCOPHAGE-XR) 500 MG 24 hr tablet Take 1 tablet (500 mg total) by mouth daily with breakfast. 90 tablet 1  ? metoprolol succinate (TOPROL-XL) 50 MG 24 hr tablet Take 1 tablet (50 mg total) by mouth daily. 90 tablet 1  ? spironolactone (ALDACTONE) 25 MG tablet Take 0.5 tablets (12.5 mg total) by mouth daily. 45 tablet 0  ? ?No current facility-administered medications on file prior to visit.  ? ? ? ?Review of System: ?Comprehensive ROS Pertinent positive and negative noted in HPI   ? ?Objective:  ?BP 135/82 (BP Location: Right Arm, Patient Position: Sitting, Cuff Size: Normal)   Pulse 60   Temp (!) 97.5 ?F (36.4 ?C) (Oral)   Ht 5\' 5"  (1.651 m)   Wt 150 lb 6.4 oz (68.2 kg)   SpO2 97%   BMI 25.03 kg/m?  ? Pedro Kemp Weights  ? 02/09/22 1418  ?Weight: 150 lb 6.4 oz (68.2 kg)  ? ?Physical Exam: ?General Appearance: Well nourished, in no apparent distress. ?Eyes: PERRLA, EOMs, conjunctiva no swelling or erythema ?Sinuses: No Frontal/maxillary tenderness ?ENT/Mouth: Ext aud canals clear, TMs without erythema, bulging.  Hearing normal.  ?Neck: Supple, thyroid normal.  ?Respiratory: Respiratory effort normal, BS equal bilaterally without rales, rhonchi, wheezing or stridor.  ?Cardio: RRR with no MRGs. Brisk peripheral pulses without edema.  ?Abdomen: Soft, +  BS.  Non tender, no guarding, rebound, hernias, masses. ?Lymphatics: Non tender without lymphadenopathy.  ?Musculoskeletal: Full ROM, 5/5 strength, normal gait.  ?Skin: Warm, dry without rashes, lesions, ecchymosis.  ?Neuro: Cranial nerves intact. Normal muscle tone, no cerebellar symptoms. Sensation intact.  ?Psych: Awake and oriented X 3, normal affect, Insight and Judgment appropriate.  ? ? ?Assessment:  ?Alias was seen today for hospitalization follow-up and diabetes. ? ?Diagnoses and  all orders for this visit: ? ?Type 2 diabetes mellitus without complication, without long-term current use of insulin ([place]) ?-     HgB A1c 7.7 Cardiology started on metformin and Jardiance will continue. ?Continue to monitor foods that are high in carbohydrates are the following rice, potatoes, breads, sugars, and pastas.  Reduction in the intake (eating) will assist in lowering your blood sugars.  ? ?Medication management ?Requested from Cardiology  ?-     Basic Metabolic Panel ? ?Colon cancer screening ?Fecal occult blood, ? ?This note has been created with Surveyor, quantity. Any transcriptional errors are unintentional.  ? ?[name], NP ?02/09/2022, 2:42 PM ?  ? ?

## 2022-02-10 ENCOUNTER — Other Ambulatory Visit: Payer: Self-pay

## 2022-02-11 LAB — BASIC METABOLIC PANEL
BUN/Creatinine Ratio: 31 — ABNORMAL HIGH (ref 10–24)
BUN: 55 mg/dL — ABNORMAL HIGH (ref 8–27)
CO2: 21 mmol/L (ref 20–29)
Calcium: 9.6 mg/dL (ref 8.6–10.2)
Chloride: 99 mmol/L (ref 96–106)
Creatinine, Ser: 1.78 mg/dL — ABNORMAL HIGH (ref 0.76–1.27)
Glucose: 148 mg/dL — ABNORMAL HIGH (ref 70–99)
Potassium: 5.5 mmol/L — ABNORMAL HIGH (ref 3.5–5.2)
Sodium: 139 mmol/L (ref 134–144)
eGFR: 42 mL/min/{1.73_m2} — ABNORMAL LOW (ref >59)

## 2022-02-13 NOTE — Discharge Summary (Signed)
Physician Discharge Summary  ?Pedro Kemp UQJ:[id number] DOB: 07/07/1956 DOA: 01/29/2022 ? ?PCP: Grayce Sessions, NP ? ?Admit date: 01/29/2022 ?Discharge date: 01/31/2022 ? ?Time spent: ? ?Recommendations for Outpatient Follow-up:  ?PCP in 1 week, please check BMP at follow-up ?Cardiology Dr. Wyline Mood in 3 weeks ? ?Discharge Diagnoses:  ?Principal Problem: ?  Acute on chronic HFrEF (heart failure with reduced ejection fraction) (HCC) ?Active Problems: ?  Coronary artery disease ?  Essential hypertension ?  Type 2 diabetes mellitus without complication, without long-term current use of insulin (HCC) ?  Hyperlipidemia associated with type 2 diabetes mellitus (HCC) ?  History of cardiac arrest ?  Leukocytosis ? ? ?Discharge Condition: Stable note ? ?Diet recommendation sodium, diabetic, heart healthy ? ?Filed Weights  ? 01/29/22 2327 01/30/22 1500 01/31/22 0417  ?Weight: 70.8 kg 68.4 kg 67.7 kg  ? ? ?History of present illness:  ?66 yo male spanish-speaking with medical history significant for HFrEF (EF 30-35% by TTE 12/16/2021), CAD s/p DES to mid LAD (04/2021), history of VT arrest s/p ICD, T2DM, HTN, HLD who presented with dyspnea. Reported acute onset of dyspnea on 04/13 at 6 am, that prompted him to come to the ED. He did had dyspnea on exertion for 24 hrs into his hospitalization. His diuretic therapy was discontinued on 10/2021 per his primary care provider. On his initial physical examination his blood pressure was 175/93, HR 96, RR 22, 02 saturation 91% on room air, lungs with bilateral rales, heart with S1 and S2 present and rhythmic with no gallops, abdomen not distended and positive lower extremity edema.  ?  ? ?Hospital Course:  ? ?Acute on chronic HFrEF (heart failure with reduced ejection fraction) (HCC) ?Echocardiogram from 11/2021 with LV systolic function reduced to 30 to 35%, with global hypokinesis, RV systolic function preserved. No significant valvular disease.  ?-Diuresed with IV  Lasix, clinically euvolemic now ?-Started on Lasix 40 Mg daily at discharge, started on low-dose Aldactone as well ?-Continue Toprol and Jardiance ?-Follow-up with Dr. Wyline Mood in [place] ?  ?Coronary artery disease ?S/p DES to mid LAD 04/2021 ?Elevated troponin ?Elevated troponin due to heart failure exacerbation, no clinical signs of acute coronary syndrome.  ?  ?Essential hypertension ?Blood pressure controlled.  ?  ?Type 2 diabetes mellitus without complication, without long-term current use of insulin (HCC) ?-Stable, metformin resumed ?  ?Leukocytosis ?Wbc is 11.2 likely reactive leukocytosis. ?No clinical signs of systemic bacterial infection, continue to hold on antibiotic therapy.  ?  ?History of cardiac arrest ?History of VT arrest s/p ICD. ?  ?Hyperlipidemia associated with type 2 diabetes mellitus (HCC) ?Continue atorvastatin. ?  ?  ?  ?  ? ? ?Consultations: ?Cards ? ?Discharge Exam: ?Vitals:  ? 01/31/22 0417 01/31/22 0725  ?BP: 130/76 139/79  ?Pulse: 67 66  ?Resp: 17 17  ?Temp: 98.2 ?F (36.8 ?C) 98 ?F (36.7 ?C)  ?SpO2: 96% 96%  ? ? ? ? ?Discharge Instructions ? ? ?Discharge Instructions   ? ? Diet - low sodium heart healthy   Complete by: As directed ?  ? Diet Carb Modified   Complete by: As directed ?  ? Increase activity slowly   Complete by: As directed ?  ? ?  ? ?Allergies as of 01/31/2022   ?No Known Allergies ?  ? ?  ?Medication List  ?  ? ?STOP taking these medications   ? ?lisinopril 40 MG tablet ?Commonly known as: ZESTRIL ?  ? ?  ? ?TAKE these medications   ? ?  aspirin EC 81 MG tablet ?Take 81 mg by mouth daily. Swallow whole. ?  ?atorvastatin 20 MG tablet ?Commonly known as: LIPITOR ?Tome 1 tableta (20 mg en total) por v?a oral diariamente. ?(Take 1 tablet (20 mg total) by mouth daily.) ?  ?clopidogrel 75 MG tablet ?Commonly known as: PLAVIX ?Tome 1 tableta (75 mg en total) por v?a oral diariamente. ?(Take 1 tablet (75 mg total) by mouth daily.) ?  ?furosemide 40 MG tablet ?Commonly known as:  LASIX ?Take 1 tablet (40 mg total) by mouth daily. ?  ?metFORMIN 500 MG 24 hr tablet ?Commonly known as: GLUCOPHAGE-XR ?Tome 1 tableta (500 mg en total) por v?a oral diariamente con el desayuno. ?(Take 1 tablet (500 mg total) by mouth daily with breakfast.) ?  ?metoprolol succinate 50 MG 24 hr tablet ?Commonly known as: TOPROL-XL ?Tome 1 tableta (50 mg en total) por v?a oral diariamente. ?(Take 1 tablet (50 mg total) by mouth daily.) ?  ? ?Spironolactone 12.5 mg daily  ? ?No Known Allergies ? Follow-up Information   ? ? Maisie Fus, MD. Go in 2 week(s).   ?Specialty: Cardiology ?Contact information: ?[address] ?[place] [address] ?[phone] ? ? ?  ?  ? ? [facility] Follow up.   ?Specialty: Family Medicine ?Why: Call to schedule appointment with your primary care provider. ?Contact information: ?[address] ?[place] [address] ?[phone] ? ?  ?  ? ?  ?  ? ?  ? ? ? ?The results of significant diagnostics from this hospitalization (including imaging, microbiology, ancillary and laboratory) are listed below for reference.   ? ?Significant Diagnostic Studies: ?DG Chest 2 View ? ?Result Date: 01/29/2022 ?CLINICAL DATA:  Shortness of breath EXAM: CHEST - 2 VIEW COMPARISON:  No comparison studies available. FINDINGS: The cardiopericardial silhouette is within normal limits for size. Diffuse interstitial opacity suggests edema. Tiny pleural effusions noted on the lateral projection. Left-sided permanent pacemaker/AICD noted. IMPRESSION: Interstitial pulmonary edema pattern with tiny bilateral pleural effusions. Electronically Signed   By: Kennith Center M.D.   On: 01/29/2022 07:54   ? ?Microbiology: ?No results found for this or any previous visit (from the past 240 hour(s)).  ? ?Labs: ?Basic Metabolic Panel: ?Recent Labs  ?Lab 02/09/22 ?1516  ?NA 139  ?K 5.5*  ?CL 99  ?CO2 21  ?GLUCOSE 148*  ?BUN 55*  ?CREATININE 1.78*  ?CALCIUM 9.6  ? ?Liver  Function Tests: ?No results for input(s): AST, ALT, ALKPHOS, BILITOT, PROT, ALBUMIN in the last 168 hours. ?No results for input(s): LIPASE, AMYLASE in the last 168 hours. ?No results for input(s): AMMONIA in the last 168 hours. ?CBC: ?No results for input(s): WBC, NEUTROABS, HGB, HCT, MCV, PLT in the last 168 hours. ?Cardiac Enzymes: ?No results for input(s): CKTOTAL, CKMB, CKMBINDEX, TROPONINI in the last 168 hours. ?BNP: ?BNP (last 3 results) ?Recent Labs  ?  01/29/22 ?1918  ?BNP 745.4*  ? ? ?ProBNP (last 3 results) ?No results for input(s): PROBNP in the last 8760 hours. ? ?CBG: ?No results for input(s): GLUCAP in the last 168 hours. ? ? ? ? ?Signed: ? ?Zannie Cove MD.  ?[facility] ?02/13/2022, 12:13 PM ? ?  ?

## 2022-03-04 ENCOUNTER — Ambulatory Visit (INDEPENDENT_AMBULATORY_CARE_PROVIDER_SITE_OTHER): Payer: Self-pay | Admitting: Internal Medicine

## 2022-03-04 ENCOUNTER — Other Ambulatory Visit: Payer: Self-pay

## 2022-03-04 VITALS — BP 130/76 | HR 68 | Wt 151.0 lb

## 2022-03-04 DIAGNOSIS — I5023 Acute on chronic systolic (congestive) heart failure: Secondary | ICD-10-CM

## 2022-03-04 MED ORDER — FUROSEMIDE 40 MG PO TABS
40.0000 mg | ORAL_TABLET | Freq: Every day | ORAL | 3 refills | Status: DC
  Filled 2022-03-04: qty 90, 90d supply, fill #0

## 2022-03-04 NOTE — Progress Notes (Signed)
?Cardiology Office Note:   ? ?Date:  03/04/2022  ? ?IDJarrett Kemp, DOB 08-Feb-1956, MRN [id number] ? ?PCP:  Grayce Sessions, NP ?  ?[facility] Providers ?Cardiologist:  Maisie Fus, MD    ? ?Referring MD: Grayce Sessions, NP  ? ?No chief complaint on file. ? ?Post Cardiac Arrest ? ?History of Present Illness:   ? ?Pedro Kemp is a 66 y.o. male with a hx of T2DM, HLD  spanish speaking requires an interpreter, referral for post hospitalization at [facility] for ischemic VT and cardiac arrest (interpretor Kandis Mannan [phone]) ? ?He was admitted at [facility] 05/14/2021 to 05/22/2021.[name] presented to [facility] with chest pain, noted to have high risk NSTEMI and acute CHF with EF 35%. He had troponin up to 15 thousand. He had cath on 7/27 that showed 100% occluded mid LAD s/p shockwave and PCI. He also had disease in his diag s/p PCI. He subsequently had VT arrest 7/29 with ACLS and ROSC. He went back to the cath lab on 7/29 and stent was patent. This was concerning for scar mediated VT.  He had an EP study on 8/2 with inducible sustained monomorphic VT.  Managed with amiodarone.He underwent DC-ICD.  He had a moderate sized hematoma at the ICD site requiring monitoring.  He was also managed for acute respiratory failure 2/2 CHF exacerbation. Crt on discharge was 1.38. A1c was 9 %. He was discharged on asa 81 mg daily,  atorvastatin 80 mg daily, plavix 75 mg daily, lisinopril 20 mg daily, lasix 80 mg daily, metop XL 50 mg daily. Started on Jardiance 10 mg. His A1c is 7.5 %  ? ?He had appointments for cardiac rehab, with cardiology at [facility]: Dr Merrily Pew. ICD check with Dr. Lanae Boast in August.  ? ?He states that he did not do exercise afterwards. After he left the hospital, he had his ICD checked. He would like to have his care here. He reports no shocks. He has no swelling or pain at the site. He denies exertional chest pain or dyspnea. He denies orthopnea, PND, no LE edema. During the day , he does some work around the  house.  Prior to his hospitalization , he had no problems with the heart. He does not smoke.  He denies palpitations, LH. He gets some dizziness. His vision gets blurry. He has no hx of syncope. He is not working will need medication assistance. ? ?Interim Hx: ?He says that his medications ran out. He did not return to the pharmacy for refills. He feels well. He has no chest pain or dyspnea on exertion. He denies orthopnea,  PND, or LE edema. He can do his activities without any issues. ? ?Interim Hx 03/04/2022 ?Presented with DOE in April. His lasix was stopped after I saw him. He was diuresed. He was started on low dose spironolactone. He improved and was discharged. Weight 67 kg. His respiration is better. He denies PND or orthopnea or LE edema. He feels dizzy with standing and frontal headache. He notes this can be coupled with blurry vision, recommended tylenol ? ?Cardiology Studies ? ?05/14/2021-Cath - RFA. 100% occluded LAD s/p Synergy 2.75 x 38 mm DES with shockwave to the LAD. 90% D1 s/p 2.5 x 12 synergy DES ? ?Echo- EF 35%, mild MR, PASP 25 mmHg ? ?S/p DC Medtronic ICD 05/20/2021 ?AAI-DDD ?VF zone > 182 bpm ATP x3, 40J x6 ?VT off ?Base rate 60 bpm ? ? ?Past Medical History:  ?Diagnosis Date  ?  Cardiac arrest [facility](HCC)   ? CHF (congestive heart failure) (HCC)   ? Coronary artery disease   ? Diabetes mellitus without complication (HCC)   ? H/O heart artery stent   ? 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  ? ? ?Past Surgical History:  ?Procedure Laterality Date  ? CARDIAC CATHETERIZATION    ? ICD IMPLANT    ? ? ?Current Medications: ?No outpatient medications have been marked as taking for the 03/04/22 encounter (Appointment) with Maisie FusBranch, Lanisa Ishler E, MD.  ?  ? ?Allergies:   Patient has no known allergies.  ? ?Social History  ? ?Socioeconomic History  ? Marital status: Single  ?  Spouse name: Not on file  ? Number of children: Not on file  ? Years of education: Not on file  ? Highest  education level: Not on file  ?Occupational History  ? Not on file  ?Tobacco Use  ? Smoking status: Never  ?  Passive exposure: Never  ? Smokeless tobacco: Never  ?Substance and Sexual Activity  ? Alcohol use: Never  ? Drug use: Never  ? Sexual activity: Not Currently  ?  Partners: Female  ?Other Topics Concern  ? Not on file  ?Social History Narrative  ? Not on file  ? ?Social Determinants of Health  ? ?Financial Resource Strain: Medium Risk  ? Difficulty of Paying Living Expenses: Somewhat hard  ?Food Insecurity: Not on file  ?Transportation Needs: No Transportation Needs  ? Lack of Transportation (Medical): No  ? Lack of Transportation (Non-Medical): No  ?Physical Activity: Not on file  ?Stress: Not on file  ?Social Connections: Not on file  ?  ? ?Family History: ?The patient's family history includes Diabetes in his brother and mother. ? ?ROS:   ?Please see the history of present illness.    ? All other systems reviewed and are negative. ? ?EKGs/Labs/Other Studies Reviewed:   ? ?The following studies were reviewed today: ? ? ?EKG:  EKG is  ordered today.  The ekg ordered today demonstrates ? ?NSR, LAFB, anterior Q waves ? ?Recent Labs: ?07/22/2021: TSH 1.610 ?01/07/2022: ALT 29 ?01/29/2022: B Natriuretic Peptide 745.4 ?01/30/2022: Hemoglobin 13.1; Magnesium 1.9; Platelets 180 ?02/09/2022: BUN 55; Creatinine, Ser 1.78; Potassium 5.5; Sodium 139  ?Recent Lipid Panel ?   ?Component Value Date/Time  ? CHOL 99 (L) 01/07/2022 1203  ? TRIG 236 (H) 01/07/2022 1203  ? HDL 31 (L) 01/07/2022 1203  ? CHOLHDL 3.2 01/07/2022 1203  ? LDLCALC 31 01/07/2022 1203  ? ? ? ?Risk Assessment/Calculations:   ?  ? ?    ? ?Physical Exam:   ? ?VS:   ? ?Vitals:  ? 03/04/22 1635  ?BP: 130/76  ?Pulse: 68  ?SpO2: 98%  ? ? ? ?Wt Readings from Last 3 Encounters:  ?02/09/22 150 lb 6.4 oz (68.2 kg)  ?02/04/22 153 lb 3.2 oz (69.5 kg)  ?01/31/22 149 lb 4.8 oz (67.7 kg)  ?  ? ?GEN:  Well nourished, well developed in no acute distress ?HEENT:  Normal ?NECK: No JVD; No carotid bruits ?LYMPHATICS: No lymphadenopathy ?CARDIAC: RRR, no murmurs, rubs, gallops ?RESPIRATORY:  Clear to auscultation without rales, wheezing or rhonchi  ?ABDOMEN: Soft, non-tender, non-distended ?MUSCULOSKELETAL:  No edema; No deformity  ?SKIN: Warm and dry ?NEUROLOGIC:  Alert and oriented x 3 ?PSYCHIATRIC:  Normal affect  ? ?ASSESSMENT:   ? ?#Ischemic CM NYHA Class I : EF 35% prescribed GDMT ( AceI, BB, SGLT2). He is euvolemic and  asymptomatic. Dry weight ~ 69 kg. He is not working and the challenge, continue to work with pharmacy assistance. He S/p ICD Medtronic DDD, Therapy zone 182 bpm. He has device checks planned. No recent shocks ?- continue DAPT (at least until 05/14/2022) ?- continue GDMT (lisinopril 20 mg daily, metop XL 50 mg daily, Jardiance 10 mg daily) ?- continue atorvastatin 80 mg daily ?- continue lasix 40 mg daily ?- stop spironolactone K is 5.5 ?- had many medication changes, requires assistance. Will plan for pharmacy visit to help with transition to entresto from lisinopril as long as crt/K imrproving ? ? ?PLAN:   ? ?In order of problems listed above: ? ?BMET in two weeks ?Stop spironolactone 12.5 mg daily ?Pharmacy visit to transition to entresto from lisinopril in three months as long as renal function/K improving ?Follow up in 6 months ? ?   ?Medication Adjustments/Labs and Tests Ordered: ?Current medicines are reviewed at length with the patient today.  Concerns regarding medicines are outlined above.  ? ? ?Signed, ?Maisie Fus, MD  ?03/04/2022 1:26 PM    ?[facility] ? ?

## 2022-03-04 NOTE — Patient Instructions (Signed)
Medication Instructions:  ?STOP spironolactone ? ?*If you need a refill on your cardiac medications before your next appointment, please call your pharmacy* ? ? ?Lab Work: ?Please return for labs in 2 weeks (BMET) ? ?Our in office lab hours are Monday-Friday 8:00-4:00, closed for lunch 12:45-1:45 pm.  No appointment needed. ? ?[facility] locations: ?  ?[place] ?- [address]  ?- [address] ([facility]) ?- [address] ?- [address] ?  ?[place] ?- [address]  ?  ?[place]  ?- [address]  ?  ?[place] ?- [address] ?- [id number] ?  ?[place]  ?- [address] ?- [address] ([facility]) ? ?If you have labs (blood work) drawn today and your tests are completely normal, you will receive your results only by: ?MyChart Message (if you have MyChart) OR ?A paper copy in the mail ?If you have any lab test that is abnormal or we need to change your treatment, we will call you to review the results. ? ?Follow-Up: ?At [facility], you and your health needs are our priority.  As part of our continuing mission to provide you with exceptional heart care, we have created designated Provider Care Teams.  These Care Teams include your primary Cardiologist (physician) and Advanced Practice Providers (APPs -  Physician Assistants and Nurse Practitioners) who all work together to provide you with the care you need, when you need it. ? ?We recommend signing up for the patient portal called "MyChart".  Sign up information is provided on this After Visit Summary.  MyChart is used to connect with patients for Virtual Visits (Telemedicine).  Patients are able to view lab/test results, encounter notes, upcoming appointments, etc.  Non-urgent messages can be sent to your provider as well.   ?To learn more about what you can do with MyChart, go to [email or web address].    ? ?Your next appointment:   ?PharmD in 3 months ?Dr. Wyline Mood in 6 months  ? ? ? ?

## 2022-03-05 ENCOUNTER — Other Ambulatory Visit: Payer: Self-pay

## 2022-03-09 ENCOUNTER — Other Ambulatory Visit: Payer: Self-pay

## 2022-03-23 ENCOUNTER — Telehealth: Payer: Self-pay | Admitting: Licensed Clinical Social Worker

## 2022-03-23 ENCOUNTER — Other Ambulatory Visit: Payer: Self-pay

## 2022-03-24 NOTE — Telephone Encounter (Signed)
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  Pt previous applications expire this month - I'll f/u as able.   Octavio Graves, MSW, LCSW Clinical Social Worker II [facility] Navigation  [phone]- work cell phone (preferred) [phone]- desk phone

## 2022-04-07 ENCOUNTER — Telehealth: Payer: Self-pay | Admitting: Licensed Clinical Social Worker

## 2022-04-07 NOTE — Telephone Encounter (Signed)
Received paperwork ([employer], [employer], [facility] Spanish language applications) that had been mailed to pt. However, paperwork not completed fully, missing supporting documentation. Will attempt to contact pt/pt sister to obtain missing items.   Octavio Graves, MSW, LCSW Clinical Social Worker II [facility] Navigation  [phone]- work cell phone (preferred) [phone]- desk phone

## 2022-04-10 ENCOUNTER — Telehealth: Payer: Self-pay | Admitting: Licensed Clinical Social Worker

## 2022-04-20 ENCOUNTER — Other Ambulatory Visit: Payer: Self-pay

## 2022-05-11 ENCOUNTER — Ambulatory Visit (INDEPENDENT_AMBULATORY_CARE_PROVIDER_SITE_OTHER): Payer: Self-pay | Admitting: Primary Care

## 2022-05-11 VITALS — BP 164/89 | HR 59 | Temp 97.5°F | Ht 66.0 in | Wt 154.0 lb

## 2022-05-11 DIAGNOSIS — E119 Type 2 diabetes mellitus without complications: Secondary | ICD-10-CM

## 2022-05-11 LAB — POCT GLYCOSYLATED HEMOGLOBIN (HGB A1C): Hemoglobin A1C: 7.8 % — AB (ref 4.0–5.6)

## 2022-05-11 NOTE — Progress Notes (Signed)
Subjective:  Patient ID: Pedro Kemp, male    DOB: 07-10-1956  Age: 66 y.o. MRN: [id number]  CC: Diabetes   HPI Pedro Kemp is a 66 year old Hispanic male ( interpreter [phone]) presents for follow-up of diabetes. Patient does not check blood sugar at home.   Compliant with meds - Yes Checking CBGs? No  Fasting avg -   Postprandial average -  Exercising regularly? - Yes Watching carbohydrate intake? - Yes Neuropathy ? - No Hypoglycemic events - No  - Recovers with :   Pertinent ROS:  Polyuria - No Polydipsia - No Vision problems - Yes- advise to have diabetic eye exam - Management of hypertension-.me Medications as noted below. Taking them regularly without complication/adverse reaction being reported today. Patient has No headache, No chest pain, No abdominal pain - No Nausea, No new weakness tingling or numbness, No Cough - shortness of breath .  History Pedro Kemp has a past medical history of Cardiac arrest (HCC), CHF (congestive heart failure) (HCC), Coronary artery disease, Diabetes mellitus without complication (HCC), and H/O heart artery stent.   Pedro Kemp has a past surgical history that includes ICD IMPLANT and Cardiac catheterization.   His family history includes Diabetes in his brother and mother.Pedro Kemp reports that Pedro Kemp has never smoked. Pedro Kemp has never been exposed to tobacco smoke. Pedro Kemp has never used smokeless tobacco. Pedro Kemp reports that Pedro Kemp does not drink alcohol and does not use drugs.  Current Outpatient Medications on File Prior to Visit  Medication Sig Dispense Refill   aspirin EC 81 MG tablet Take 81 mg by mouth daily. Swallow whole.     atorvastatin (LIPITOR) 20 MG tablet Take 1 tablet (20 mg total) by mouth daily. 90 tablet 3   clopidogrel (PLAVIX) 75 MG tablet Take 1 tablet (75 mg total) by mouth daily. 90 tablet 1   empagliflozin (JARDIANCE) 10 MG TABS tablet Take 1 tablet (10 mg total) by mouth daily before breakfast. 90 tablet 1   furosemide (LASIX) 40 MG tablet  Take 1 tablet (40 mg total) by mouth daily. 90 tablet 3   losartan (COZAAR) 100 MG tablet Take 1 tablet (100 mg total) by mouth daily. 90 tablet 1   metFORMIN (GLUCOPHAGE-XR) 500 MG 24 hr tablet Take 1 tablet (500 mg total) by mouth daily with breakfast. 90 tablet 1   metoprolol succinate (TOPROL-XL) 50 MG 24 hr tablet Take 1 tablet (50 mg total) by mouth daily. 90 tablet 1   No current facility-administered medications on file prior to visit.    ROS Comprehensive ROS Pertinent positive and negative noted in HPI    Objective:  BP (!) 164/89 (BP Location: Right Arm, Patient Position: Sitting, Cuff Size: Normal)   Pulse (!) 59   Temp (!) 97.5 F (36.4 C) (Oral)   Ht 5\' 6"  (1.676 m)   Wt 154 lb (69.9 kg)   SpO2 99%   BMI 24.86 kg/m   BP Readings from Last 3 Encounters:  05/11/22 (!) 164/89  03/04/22 130/76  02/09/22 135/82    Wt Readings from Last 3 Encounters:  05/11/22 154 lb (69.9 kg)  03/04/22 151 lb (68.5 kg)  02/09/22 150 lb 6.4 oz (68.2 kg)    Physical Exam Vitals reviewed.  Constitutional:      Appearance: Normal appearance.  HENT:     Head: Normocephalic.     Right Ear: Tympanic membrane normal.     Left Ear: Tympanic membrane normal.     Nose: Nose normal.  Eyes:  Extraocular Movements: Extraocular movements intact.  Cardiovascular:     Rate and Rhythm: Normal rate and regular rhythm.  Pulmonary:     Effort: Pulmonary effort is normal.     Breath sounds: Normal breath sounds.  Abdominal:     General: Bowel sounds are normal.     Palpations: Abdomen is soft.  Musculoskeletal:        General: Normal range of motion.     Cervical back: Normal range of motion.  Skin:    General: Skin is warm and dry.  Neurological:     Mental Status: Pedro Kemp is alert and oriented to person, place, and time.  Psychiatric:        Mood and Affect: Mood normal.        Behavior: Behavior normal.    Lab Results  Component Value Date   HGBA1C 7.8 (A) 05/11/2022   HGBA1C  7.7 (A) 02/09/2022   HGBA1C 7.2 (A) 10/22/2021    Lab Results  Component Value Date   WBC 11.2 (H) 01/30/2022   HGB 13.1 01/30/2022   HCT 40.7 01/30/2022   PLT 180 01/30/2022   GLUCOSE 148 (H) 02/09/2022   CHOL 99 (L) 01/07/2022   TRIG 236 (H) 01/07/2022   HDL 31 (L) 01/07/2022   LDLCALC 31 01/07/2022   ALT 29 01/07/2022   AST 28 01/07/2022   NA 139 02/09/2022   K 5.5 (H) 02/09/2022   CL 99 02/09/2022   CREATININE 1.78 (H) 02/09/2022   BUN 55 (H) 02/09/2022   CO2 21 02/09/2022   TSH 1.610 07/22/2021   HGBA1C 7.8 (A) 05/11/2022     Assessment & Plan:   Pedro Kemp was seen today for diabetes.  Diagnoses and all orders for this visit:  Type 2 diabetes mellitus without complication, without long-term current use of insulin (HCC) -     HgB A1c 7.8 cardiology has been prescribing diabetes medication- up 0.1 in 3 months . ADA recommends the following therapeutic goals for glycemic control related to A1c measurements: Goal of therapy: Less than 6.5 hemoglobin A1c.  Reference clinical practice recommendations. Foods that are high in carbohydrates are the following rice, potatoes, breads, sugars, and pastas.  Reduction in the intake (eating) will assist in lowering your blood sugars.   I am having Pedro Kemp maintain his aspirin EC, atorvastatin, clopidogrel, metFORMIN, metoprolol succinate, empagliflozin, losartan, and furosemide.  No orders of the defined types were placed in this encounter.    Follow-up:   Return in about 3 months (around 08/11/2022) for DM.  The above assessment and management plan was discussed with the patient. The patient verbalized understanding of and has agreed to the management plan. Patient is aware to call the clinic if symptoms fail to improve or worsen. Patient is aware when to return to the clinic for a follow-up visit. Patient educated on when it is appropriate to go to the emergency department.   Gwinda Passe, NP-C

## 2022-05-13 ENCOUNTER — Telehealth: Payer: Self-pay

## 2022-05-13 NOTE — Telephone Encounter (Signed)
Received notification from patient assistance company for Jardiance that patient portion of forms is missing the proof of income.   Spoke with patients sister (okay per DPR) and advised her this portion was missing and that patient will need to call the company to provide this information as patient does not have any income for the last year. Number to patient assistance company given to patients sister. She states she will have patient call.

## 2022-05-16 ENCOUNTER — Encounter (INDEPENDENT_AMBULATORY_CARE_PROVIDER_SITE_OTHER): Payer: Self-pay | Admitting: Primary Care

## 2022-05-27 ENCOUNTER — Other Ambulatory Visit: Payer: Self-pay

## 2022-06-04 ENCOUNTER — Encounter: Payer: Self-pay | Admitting: Pharmacist

## 2022-06-04 ENCOUNTER — Ambulatory Visit (INDEPENDENT_AMBULATORY_CARE_PROVIDER_SITE_OTHER): Payer: Self-pay | Admitting: Pharmacist

## 2022-06-04 ENCOUNTER — Other Ambulatory Visit: Payer: Self-pay

## 2022-06-04 VITALS — BP 150/88 | HR 63 | Wt 157.8 lb

## 2022-06-04 DIAGNOSIS — I5023 Acute on chronic systolic (congestive) heart failure: Secondary | ICD-10-CM

## 2022-06-04 DIAGNOSIS — I10 Essential (primary) hypertension: Secondary | ICD-10-CM

## 2022-06-04 DIAGNOSIS — I509 Heart failure, unspecified: Secondary | ICD-10-CM

## 2022-06-04 MED ORDER — FUROSEMIDE 40 MG PO TABS
40.0000 mg | ORAL_TABLET | Freq: Every day | ORAL | 3 refills | Status: DC
  Filled 2022-06-04: qty 90, 90d supply, fill #0

## 2022-06-04 MED ORDER — METOPROLOL SUCCINATE ER 50 MG PO TB24
50.0000 mg | ORAL_TABLET | Freq: Every day | ORAL | 1 refills | Status: DC
  Filled 2022-06-04: qty 90, 90d supply, fill #0

## 2022-06-04 NOTE — Progress Notes (Signed)
Patient ID: Pedro Kemp                 DOB: 1956/04/10                      MRN: [id number]     HPI: Pedro Kemp is a 66 y.o. male referred by Dr. Wyline Mood to pharmacy clinic for HF medication management. PMH is significant for CHF, HTN, CAD, T2DM, and HLD.. Most recent LVEF 30-35% on 12/16/21.Marland Kitchen  Patient presents today with sister Pedro Kemp) and niece Pedro Kemp). Patient with interpreter.  Reports medication compliance and brought all medications. All medications on list with him except Jardiance. Has very few metoprolol and furosemide in bottles.  Reports no chest pain, dizziness, fatigue or swelling. Endorses blurry vision especially in sunlight. Reports blood sugar this morning was 130. Checked in room using glucometer: 103.  Does not check blood pressure at home because he has no cuff.  Patient had been sent paperwork to apply for multiple medication assistance programs however he had difficulty filling them out or were missing information.  Brought in LCSW to help patient and explain what was needed. Was not able to complete Jardiance patient assistance.  Has not been started on spironolactone yet due to hyperkalemia.  Needs updated lab work, has not had drawn yet. Can not start entresto yet until he completes paperwork.   Current CHF meds:  Losartan 100mg  daily Metoprolol succinate 50mg  daily Furosemide 40mg  daily  BP goal: <130/80  Wt Readings from Last 3 Encounters:  06/04/22 157 lb 12.8 oz (71.6 kg)  05/11/22 154 lb (69.9 kg)  03/04/22 151 lb (68.5 kg)   BP Readings from Last 3 Encounters:  06/04/22 (!) 150/88  05/11/22 (!) 164/89  03/04/22 130/76   Pulse Readings from Last 3 Encounters:  06/04/22 63  05/11/22 (!) 59  03/04/22 68    Renal function: CrCl cannot be calculated (Patient's most recent lab result is older than the maximum 21 days allowed.).  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 Swallow whole.     atorvastatin (LIPITOR) 20 MG tablet Take 1 tablet (20 mg total) by mouth daily. 90 tablet 3   clopidogrel (PLAVIX) 75 MG tablet Take 1 tablet (75 mg total) by mouth daily. 90 tablet 1   empagliflozin (JARDIANCE) 10 MG TABS tablet Take 1 tablet (10 mg total) by mouth daily before breakfast. 90 tablet 1   losartan (COZAAR) 100 MG tablet Take 1 tablet (100 mg total) by mouth daily. 90 tablet 1   metFORMIN (GLUCOPHAGE-XR) 500 MG 24 hr tablet Take 1 tablet (500 mg total) by mouth daily with breakfast. 90 tablet 1   No current facility-administered medications on file prior to visit.    No Known Allergies   Assessment/Plan:  1. CHF -    HYPERTENSION CONTROL Vitals:   06/04/22 1543 06/04/22 1620  BP: (!) 153/92 (!) 150/88    The patient's blood pressure is elevated above target today.  In order to address the patient's elevated BP: Blood pressure  will be monitored at home to determine if medication changes need to be made.    Patient BP in room 150/88 which is above goal of <130/80.  Unfortunately, difficult to titrate current medications due to losartan being at max dose and pulse rate low. Advised patient to have lab work drawn to consider starting spironolactone, however lab has closed for the day. Reprinted Jardiance patient assistance paperwork in Spanish.  Sister Pedro Kemp will bring in updated paperwork next week.  Gave patient ASA samples.  Continue metoprolol succinate 50mg  daily Continue losartan 100mg  daily Continue furosemide 40mg  daily Check BMP next week and consider addition of spironolactone  , PharmD, BCACP, CDCES,  CPP [address], [address] [place], [place], [address] Phone: [phone], Fax: [phone]

## 2022-06-04 NOTE — Patient Instructions (Addendum)
It was nice seeing you again  We would like your blood pressure to stay less than 130/80  Please continue your current medications  I have refilled your furosemide and metoprolol  Please complete the application for Jardiance  [place], PharmD, BCACP, CDCES, CPP [address], [address] [place], [place], [address] Phone: [phone], Fax: [phone]

## 2022-06-04 NOTE — Progress Notes (Signed)
Heart and Vascular Care Navigation  06/04/2022  [name] [birth year] [id number]  Reason for Referral:  Patient is participating in a Managed Medicaid Plan: No, self pay only. Given new CAFA and [employer] with patient face to face for follow up visit for Heart and Vascular Care Coordination.                                                                                                   Assessment:   LCSW met with Pedro Kemp during pharmacy appt with Pedro Kemp sister Margreta Journey and his niece present. Introduced self, role, reason for visit. Assisted by interpreter we completed new applications for CAFA and Orange Card- Pedro Kemp will need to fully complete application and then return with needed documents to the office- Pedro Kemp sister aware of this. They were given my number and encouraged to call as needed.                                     HRT/VAS Care Coordination     Patients Home Cardiology Office [place] Team Social Worker   Social Worker Name: Westley Hummer, LCSW, [place] arrangements for the past 2 months Single Family Home   Lives with: Siblings   Patient Current Insurance Coverage Orange Card; [place] Assistance   Patient Has Concern With Paying Medical Bills Yes   Patient Concerns With Medical Bills no insurance coverage   Medical Bill Referrals: approved for [employer] through 03/2022;  [facility] Financial Assistance submitted   Does Patient Have Prescription Coverage? No   Patient Prescription Assistance Programs [facility] Card   Home Assistive Devices/Equipment CBG Meter       Social History:                                                                             SDOH Screenings   Alcohol Screen: Not on file  Depression (PHQ2-9): Low Risk  (02/09/2022)   Depression (PHQ2-9)    PHQ-2 Score: 1  Financial Resource Strain: Medium Risk (09/18/2021)   Overall Financial Resource Strain (CARDIA)    Difficulty of Paying  Living Expenses: Somewhat hard  Food Insecurity: Not on file  Housing: Not on file  Physical Activity: Not on file  Social Connections: Not on file  Stress: Not on file  Tobacco Use: Low Risk  (05/16/2022)   Patient History    Smoking Tobacco Use: Never    Smokeless Tobacco Use: Never    Passive Exposure: Never  Transportation Needs: No Transportation Needs (09/18/2021)   PRAPARE - Transportation    Lack of Transportation (Medical): No    Lack of Transportation (Non-Medical): No    SDOH Interventions: Financial Resources:  Financial Counseling for [employer] Program    Other Care Navigation Interventions:     Provided Pharmacy assistance resources  CCHW/Spoke to Pedro Kemp about contacting [employer] for Jardiance   Follow-up plan:   LCSW will f/u next week to ensure Pedro Kemp sister understood everything needed and answer any additional questions.

## 2022-06-08 ENCOUNTER — Other Ambulatory Visit: Payer: Self-pay

## 2022-06-11 ENCOUNTER — Telehealth: Payer: Self-pay | Admitting: Licensed Clinical Social Worker

## 2022-06-11 NOTE — Telephone Encounter (Signed)
H&V Care Navigation CSW Progress Note  Clinical Social Worker contacted caregiver by phone to f/u on assistance applications re-provided during last appt. Was able to reach [place] at [phone] w/ assistance of [facility], #[id number]. Re-introduced self, role, reason for call. Pt sister is working on Radio producer notarized then will bring it by office. She is hopeful to do this tomorrow, or next week. I remain available.   Patient is participating in a Managed Medicaid Plan:  No, self pay only.  SDOH Screenings   Alcohol Screen: Not on file  Depression (PHQ2-9): Low Risk  (02/09/2022)   Depression (PHQ2-9)    PHQ-2 Score: 1  Financial Resource Strain: Medium Risk (09/18/2021)   Overall Financial Resource Strain (CARDIA)    Difficulty of Paying Living Expenses: Somewhat hard  Food Insecurity: Not on file  Housing: Not on file  Physical Activity: Not on file  Social Connections: Not on file  Stress: Not on file  Tobacco Use: Low Risk  (06/04/2022)   Patient History    Smoking Tobacco Use: Never    Smokeless Tobacco Use: Never    Passive Exposure: Never  Transportation Needs: No Transportation Needs (09/18/2021)   PRAPARE - Transportation    Lack of Transportation (Medical): No    Lack of Transportation (Non-Medical): No   Octavio Graves, MSW, LCSW Clinical Social Worker II [facility] Heart/Vascular Care Navigation  [phone]- work cell phone (preferred) [phone]- desk phone

## 2022-06-12 ENCOUNTER — Telehealth: Payer: Self-pay | Admitting: Licensed Clinical Social Worker

## 2022-06-12 NOTE — Telephone Encounter (Signed)
H&V Care Navigation CSW Progress Note  Clinical Social Worker contacted caregiver by phone to update pt sister about paperwork turned into the office. With the assistance of [employer] I was able to reach her on phone and then in the office. I discussed missing documents: letter stating did not file taxes, lease, and utility bill. Pt sister aware I am out of the office next week but that she can turn in paperwork and it will be submitted when I return. No additional questions/concerns at this time. I remain available.   Patient is participating in a Managed Medicaid Plan:  No, self pay- Orange Card and CAFA pending renewal  SDOH Screenings   Alcohol Screen: Not on file  Depression (PHQ2-9): Low Risk  (02/09/2022)   Depression (PHQ2-9)    PHQ-2 Score: 1  Financial Resource Strain: High Risk (06/12/2022)   Overall Financial Resource Strain (CARDIA)    Difficulty of Paying Living Expenses: Hard  Food Insecurity: No Food Insecurity (06/12/2022)   Hunger Vital Sign    Worried About Running Out of Food in the Last Year: Never true    Ran Out of Food in the Last Year: Never true  Housing: Low Risk  (06/12/2022)   Housing    Last Housing Risk Score: 0  Physical Activity: Not on file  Social Connections: Not on file  Stress: Not on file  Tobacco Use: Low Risk  (06/04/2022)   Patient History    Smoking Tobacco Use: Never    Smokeless Tobacco Use: Never    Passive Exposure: Never  Transportation Needs: No Transportation Needs (06/12/2022)   PRAPARE - Transportation    Lack of Transportation (Medical): No    Lack of Transportation (Non-Medical): No    Octavio Graves, MSW, LCSW Clinical Social Worker II [facility] Heart/Vascular Care Navigation  [phone]- work cell phone (preferred) [phone]- desk phone

## 2022-06-13 LAB — BASIC METABOLIC PANEL
BUN/Creatinine Ratio: 23 (ref 10–24)
BUN: 30 mg/dL — ABNORMAL HIGH (ref 8–27)
CO2: 21 mmol/L (ref 20–29)
Calcium: 9.4 mg/dL (ref 8.6–10.2)
Chloride: 104 mmol/L (ref 96–106)
Creatinine, Ser: 1.32 mg/dL — ABNORMAL HIGH (ref 0.76–1.27)
Glucose: 196 mg/dL — ABNORMAL HIGH (ref 70–99)
Potassium: 5.3 mmol/L — ABNORMAL HIGH (ref 3.5–5.2)
Sodium: 141 mmol/L (ref 134–144)
eGFR: 60 mL/min/{1.73_m2} (ref >59)

## 2022-06-23 ENCOUNTER — Emergency Department (HOSPITAL_COMMUNITY)
Admission: EM | Admit: 2022-06-23 | Discharge: 2022-06-23 | Disposition: A | Discharge status: Home / Self Care | Payer: Self-pay | Attending: Emergency Medicine | Admitting: Emergency Medicine

## 2022-06-23 ENCOUNTER — Emergency Department (HOSPITAL_COMMUNITY): Payer: Self-pay

## 2022-06-23 ENCOUNTER — Other Ambulatory Visit: Payer: Self-pay

## 2022-06-23 DIAGNOSIS — D72829 Elevated white blood cell count, unspecified: Secondary | ICD-10-CM | POA: Insufficient documentation

## 2022-06-23 DIAGNOSIS — R109 Unspecified abdominal pain: Secondary | ICD-10-CM | POA: Insufficient documentation

## 2022-06-23 DIAGNOSIS — I2510 Atherosclerotic heart disease of native coronary artery without angina pectoris: Secondary | ICD-10-CM | POA: Insufficient documentation

## 2022-06-23 DIAGNOSIS — I509 Heart failure, unspecified: Secondary | ICD-10-CM | POA: Insufficient documentation

## 2022-06-23 DIAGNOSIS — R5381 Other malaise: Secondary | ICD-10-CM | POA: Insufficient documentation

## 2022-06-23 DIAGNOSIS — F419 Anxiety disorder, unspecified: Secondary | ICD-10-CM | POA: Insufficient documentation

## 2022-06-23 DIAGNOSIS — R12 Heartburn: Secondary | ICD-10-CM | POA: Insufficient documentation

## 2022-06-23 DIAGNOSIS — D649 Anemia, unspecified: Secondary | ICD-10-CM | POA: Insufficient documentation

## 2022-06-23 DIAGNOSIS — Z7984 Long term (current) use of oral hypoglycemic drugs: Secondary | ICD-10-CM | POA: Insufficient documentation

## 2022-06-23 DIAGNOSIS — Z950 Presence of cardiac pacemaker: Secondary | ICD-10-CM | POA: Insufficient documentation

## 2022-06-23 DIAGNOSIS — I110 Hypertensive heart disease with heart failure: Secondary | ICD-10-CM | POA: Insufficient documentation

## 2022-06-23 DIAGNOSIS — Z79899 Other long term (current) drug therapy: Secondary | ICD-10-CM | POA: Insufficient documentation

## 2022-06-23 DIAGNOSIS — E119 Type 2 diabetes mellitus without complications: Secondary | ICD-10-CM | POA: Insufficient documentation

## 2022-06-23 DIAGNOSIS — Z7902 Long term (current) use of antithrombotics/antiplatelets: Secondary | ICD-10-CM | POA: Insufficient documentation

## 2022-06-23 DIAGNOSIS — Z7982 Long term (current) use of aspirin: Secondary | ICD-10-CM | POA: Insufficient documentation

## 2022-06-23 DIAGNOSIS — R0789 Other chest pain: Secondary | ICD-10-CM | POA: Insufficient documentation

## 2022-06-23 LAB — CBC WITH DIFFERENTIAL/PLATELET
Abs Immature Granulocytes: 0.04 10*3/uL (ref 0.00–0.07)
Basophils Absolute: 0.1 10*3/uL (ref 0.0–0.1)
Basophils Relative: 1 %
Eosinophils Absolute: 2.6 10*3/uL — ABNORMAL HIGH (ref 0.0–0.5)
Eosinophils Relative: 23 %
HCT: 37.4 % — ABNORMAL LOW (ref 39.0–52.0)
Hemoglobin: 12.3 g/dL — ABNORMAL LOW (ref 13.0–17.0)
Immature Granulocytes: 0 %
Lymphocytes Relative: 19 %
Lymphs Abs: 2.2 10*3/uL (ref 0.7–4.0)
MCH: 30.8 pg (ref 26.0–34.0)
MCHC: 32.9 g/dL (ref 30.0–36.0)
MCV: 93.5 fL (ref 80.0–100.0)
Monocytes Absolute: 0.6 10*3/uL (ref 0.1–1.0)
Monocytes Relative: 5 %
Neutro Abs: 5.9 10*3/uL (ref 1.7–7.7)
Neutrophils Relative %: 52 %
Platelets: 154 10*3/uL (ref 150–400)
RBC: 4 MIL/uL — ABNORMAL LOW (ref 4.22–5.81)
RDW: 12.1 % (ref 11.5–15.5)
WBC: 11.4 10*3/uL — ABNORMAL HIGH (ref 4.0–10.5)
nRBC: 0 % (ref 0.0–0.2)

## 2022-06-23 LAB — COMPREHENSIVE METABOLIC PANEL
ALT: 23 U/L (ref 0–44)
AST: 19 U/L (ref 15–41)
Albumin: 4.2 g/dL (ref 3.5–5.0)
Alkaline Phosphatase: 81 U/L (ref 38–126)
Anion gap: 8 (ref 5–15)
BUN: 42 mg/dL — ABNORMAL HIGH (ref 8–23)
CO2: 23 mmol/L (ref 22–32)
Calcium: 9.3 mg/dL (ref 8.9–10.3)
Chloride: 106 mmol/L (ref 98–111)
Creatinine, Ser: 1.39 mg/dL — ABNORMAL HIGH (ref 0.61–1.24)
GFR, Estimated: 56 mL/min — ABNORMAL LOW (ref >60)
Glucose, Bld: 254 mg/dL — ABNORMAL HIGH (ref 70–99)
Potassium: 4.6 mmol/L (ref 3.5–5.1)
Sodium: 137 mmol/L (ref 135–145)
Total Bilirubin: 0.6 mg/dL (ref 0.3–1.2)
Total Protein: 7.2 g/dL (ref 6.5–8.1)

## 2022-06-23 LAB — TROPONIN I (HIGH SENSITIVITY)
Troponin I (High Sensitivity): 22 ng/L — ABNORMAL HIGH (ref <18)
Troponin I (High Sensitivity): 21 ng/L — ABNORMAL HIGH (ref <18)

## 2022-06-23 NOTE — Discharge Instructions (Addendum)
Please read and follow all provided instructions.  Your diagnoses today include:  1. Chest pressure     Tests performed today include: An EKG of your heart: Stable, paced rhythm A chest x-ray: Looks clear, no signs of heart failure or pneumonia Cardiac enzymes: a blood test for heart muscle damage, were both stable, no suggestion of heart attack or stress on the heart Blood counts and electrolytes: No significant problems Vital signs. See below for your results today.   Medications prescribed:  None  Take any prescribed medications only as directed.  Follow-up instructions: Please follow-up with your primary care provider as soon as you can for further evaluation of your symptoms.   Return instructions:  SEEK IMMEDIATE MEDICAL ATTENTION IF: You have severe chest pain, especially if the pain is crushing or pressure-like and spreads to the arms, back, neck, or jaw, or if you have sweating, nausea or vomiting, or trouble with breathing. THIS IS AN EMERGENCY. Do not wait to see if the pain will go away. Get medical help at once. Call 911. DO NOT drive yourself to the hospital.  Your chest pain gets worse and does not go away after a few minutes of rest.  You have an attack of chest pain lasting longer than what you usually experience.  You have significant dizziness, if you pass out, or have trouble walking.  You have chest pain not typical of your usual pain for which you originally saw your caregiver.  You have any other emergent concerns regarding your health.  Additional Information: Chest pain comes from many different causes. Your caregiver has diagnosed you as having chest pain that is not specific for one problem, but does not require admission.  You are at low risk for an acute heart condition or other serious illness.   Your vital signs today were: BP 139/72   Pulse (!) 59   Temp 98.1 F (36.7 C)   Resp 13   SpO2 99%  If your blood pressure (BP) was elevated above 135/85  this visit, please have this repeated by your doctor within one month. --------------

## 2022-06-23 NOTE — ED Provider Triage Note (Addendum)
Emergency Medicine Provider Triage Evaluation Note  Pedro Kemp , a 66 y.o. male  was evaluated in triage.  He has a history, per records, of CHF, HTN, CAD, DM, and HLD. Spanish interpreter necessary.  Pt complains of vague chest pressure, intermittently, over the past 2 weeks.  This is new problem.  Describes it as heartburn.  He denies associated nausea, vomiting, diarrhea.  He states that he has a little difficulty with breathing.  Denies drugs or alcohol.  Review of Systems  Positive: Chest pressure Negative: Fever  Physical Exam  BP (!) 179/54   Pulse 63   Temp 97.7 F (36.5 C)   Resp 18   SpO2 99%  Gen:   Awake, no distress   Resp:  Normal effort  MSK:   Moves extremities without difficulty  Other:    Medical Decision Making  Medically screening exam initiated at 9:03 AM.  Appropriate orders placed.  Pedro Kemp was informed that the remainder of the evaluation will be completed by another provider, this initial triage assessment does not replace that evaluation, and the importance of remaining in the ED until their evaluation is complete.     Renne Crigler, PA-C 06/23/22 0905    Renne Crigler, PA-C 06/23/22 [phone]

## 2022-06-23 NOTE — ED Provider Notes (Signed)
[facility] EMERGENCY DEPARTMENT Provider Note   CSN: [id number] Arrival date & time: 06/23/22  0830     History  Chief Complaint  Patient presents with   Anxiety   Heartburn   Abdominal Pain    Pedro Kemp is a 66 y.o. male.  Patient with history, per records, of CHF, HTN, CAD, DM, and HLD. Also cardiac arrest, s/p pacemaker/defibrillator.  Spanish interpreter necessary.  Seen by myself on arrival to triage.  Pt complains of vague chest pressure, intermittently, over the past 2 weeks.  This is new problem.  He describes it as heartburn.  He denies associated nausea, vomiting, diarrhea.  He states that he has a little difficulty with breathing.  Denies drugs or alcohol.  No worsening lower extremity swelling.  Does admit to being anxious about his symptoms.       Home Medications Prior to Admission medications   Medication Sig Start Date End Date Taking? Authorizing Provider  aspirin EC 81 MG tablet Take 81 mg by mouth daily. Swallow whole.    [provider]  atorvastatin (LIPITOR) 20 MG tablet Take 1 tablet (20 mg total) by mouth daily. 01/07/22   Maisie Fus, MD  clopidogrel (PLAVIX) 75 MG tablet Take 1 tablet (75 mg total) by mouth daily. 01/07/22   Maisie Fus, MD  empagliflozin (JARDIANCE) 10 MG TABS tablet Take 1 tablet (10 mg total) by mouth daily before breakfast. Patient not taking: Reported on 06/04/2022 02/04/22   Maisie Fus, MD  furosemide (LASIX) 40 MG tablet Take 1 tablet (40 mg total) by mouth daily. 06/04/22   Maisie Fus, MD  losartan (COZAAR) 100 MG tablet Take 1 tablet (100 mg total) by mouth daily. 02/04/22   Maisie Fus, MD  metFORMIN (GLUCOPHAGE-XR) 500 MG 24 hr tablet Take 1 tablet (500 mg total) by mouth daily with breakfast. 01/07/22   Maisie Fus, MD  metoprolol succinate (TOPROL-XL) 50 MG 24 hr tablet Take 1 tablet (50 mg total) by mouth daily. 06/04/22   Maisie Fus, MD      Allergies    Patient has no  known allergies.    Review of Systems   Review of Systems  Physical Exam Updated Vital Signs BP (!) 149/77 (BP Location: Right Arm)   Pulse 60   Temp 98.1 F (36.7 C)   Resp 16   SpO2 100%   Physical Exam Vitals and nursing note reviewed.  Constitutional:      General: He is not in acute distress.    Appearance: He is well-developed.  HENT:     Head: Normocephalic and atraumatic.  Eyes:     General:        Right eye: No discharge.        Left eye: No discharge.     Conjunctiva/sclera: Conjunctivae normal.  Cardiovascular:     Rate and Rhythm: Normal rate and regular rhythm.     Heart sounds: Normal heart sounds.  Pulmonary:     Effort: Pulmonary effort is normal.     Breath sounds: Normal breath sounds.  Chest:     Comments: Defibrillator pocket left upper chest, no overlying erythema or tenderness Abdominal:     Palpations: Abdomen is soft.     Tenderness: There is no abdominal tenderness.  Musculoskeletal:     Cervical back: Normal range of motion and neck supple.  Skin:    General: Skin is warm and dry.  Neurological:  Mental Status: He is alert.     ED Results / Procedures / Treatments   Labs (all labs ordered are listed, but only abnormal results are displayed) Labs Reviewed  CBC WITH DIFFERENTIAL/PLATELET - Abnormal; Notable for the following components:      Result Value   WBC 11.4 (*)    RBC 4.00 (*)    Hemoglobin 12.3 (*)    HCT 37.4 (*)    Eosinophils Absolute 2.6 (*)    All other components within normal limits  COMPREHENSIVE METABOLIC PANEL - Abnormal; Notable for the following components:   Glucose, Bld 254 (*)    BUN 42 (*)    Creatinine, Ser 1.39 (*)    GFR, Estimated 56 (*)    All other components within normal limits  TROPONIN I (HIGH SENSITIVITY) - Abnormal; Notable for the following components:   Troponin I (High Sensitivity) 22 (*)    All other components within normal limits  TROPONIN I (HIGH SENSITIVITY) - Abnormal; Notable  for the following components:   Troponin I (High Sensitivity) 21 (*)    All other components within normal limits  PATHOLOGIST SMEAR REVIEW    EKG EKG Interpretation  Date/Time:  Tuesday June 23 2022 09:12:05 EDT Ventricular Rate:  71 PR Interval:  162 QRS Duration: 120 QT Interval:  396 QTC Calculation: 430 R Axis:   -34 Text Interpretation: ATRIAL PACED RHYTHM ST-t wave abnormality Artifact Abnormal ECG Confirmed by Gerhard Munch [phone]) on 06/23/2022 9:28:03 AM  Radiology DG Chest 2 View  Result Date: 06/23/2022 CLINICAL DATA:  Chest pressure EXAM: CHEST - 2 VIEW COMPARISON:  01/29/2022 FINDINGS: The heart size and mediastinal contours are within normal limits. Left chest multi lead pacer defibrillator. Both lungs are clear. The visualized skeletal structures are unremarkable. IMPRESSION: No acute abnormality of the lungs. Electronically Signed   By: Jearld Lesch M.D.   On: 06/23/2022 09:32    Procedures Procedures    Medications Ordered in ED Medications - No data to display  ED Course/ Medical Decision Making/ A&P    Patient seen and examined. History obtained directly from patient.  I also reviewed patient's outpatient cardiology notes.  Work-up including labs, imaging, EKG ordered in triage, if performed, were reviewed.    Spanish interpreter used.  On reexam, patient states on and off pressure.  No shortness of breath at this time.  No difficulties with nausea or vomiting.  He appears stable.  Asked family at bedside if they had any concerns, and they voiced no.  No concerns about discharge to home today.  Labs/EKG: Independently reviewed and interpreted.  This included: CBC with mild anemia, slightly worse than previous but not severe, nonspecific elevation in white blood cell count 11.4; CMP shows glucose 254, creatinine baseline at 1.39 with BUN of 42, normal liver function; troponin 22 > 21, much lower than previous admission for CHF exacerbation.  Imaging:  Independently visualized and interpreted.  This included: Chest x-ray, agree negative.  Pacemaker defibrillator noted in left upper chest.  Medications/Fluids: None ordered   Most recent vital signs reviewed and are as follows: BP 139/72   Pulse (!) 59   Temp 98.1 F (36.7 C)   Resp 13   SpO2 99%   Initial impression: nonspecific chest discomfort  Most current vital signs reviewed and are as follows: BP (!) 152/83   Pulse 60   Temp 98.1 F (36.7 C)   Resp 19   SpO2 100%   Plan: Discharge to home.  Prescriptions written for: None  Other home care instructions discussed: Rest, avoidance of triggers  Return and follow-up instructions: I encouraged patient to return to ED with severe chest pain, especially if the pain is crushing or pressure-like and spreads to the arms, back, neck, or jaw, or if they have associated sweating, vomiting, or shortness of breath with the pain, or significant pain with activity.  Also discussed return if any ICD discharges.  We discussed that the evaluation here today indicates a low-risk of serious cause of chest pain, including heart trouble or a blood clot, but no evaluation is perfect and chest pain can evolve with time. The patient verbalized understanding and agreed.  I encouraged patient to follow-up with their provider in the next 48 hours for recheck.                             Medical Decision Making Amount and/or Complexity of Data Reviewed Labs: ordered. Radiology: ordered.   For this patient's complaint of chest pain, the following emergent conditions were considered on the differential diagnosis: acute coronary syndrome, pulmonary embolism, pneumothorax, myocarditis, pericardial tamponade, aortic dissection, thoracic aortic aneurysm complication, esophageal perforation.   Other causes were also considered including: gastroesophageal reflux disease, musculoskeletal pain including costochondritis, pneumonia/pleurisy, herpes zoster,  pericarditis.  In regards to possibility of ACS, patient has atypical features of pain in setting of known coronary artery disease.  In regards to possibility of PE, symptoms are atypical for PE and risk profile is low, making PE low likelihood.  The patient's vital signs, pertinent lab work and imaging were reviewed and interpreted as discussed in the ED course. Hospitalization was considered for further testing, treatments, or serial exams/observation. However as patient is well-appearing, has a stable exam, and reassuring studies today, I do not feel that they warrant admission at this time. This plan was discussed with the patient who verbalizes agreement and comfort with this plan and seems reliable and able to return to the Emergency Department with worsening or changing symptoms.         Final Clinical Impression(s) / ED Diagnoses Final diagnoses:  Chest pressure    Rx / DC Orders ED Discharge Orders     None         Renne Crigler, PA-C 06/23/22 1406    Gerhard Munch, MD 06/23/22 1620

## 2022-06-23 NOTE — ED Triage Notes (Signed)
Pt/translator stated, Pedro Kemp had some burning and not able to eat. I think I have some anxiety. Started 2 weeks ago.

## 2022-06-23 NOTE — ED Notes (Signed)
Got patient into a gown on the monitor patient is resting with call bell in reach and family at bedside ?

## 2022-06-25 LAB — PATHOLOGIST SMEAR REVIEW

## 2022-06-29 ENCOUNTER — Other Ambulatory Visit: Payer: Self-pay

## 2022-07-07 ENCOUNTER — Telehealth: Payer: Self-pay | Admitting: Licensed Clinical Social Worker

## 2022-07-07 NOTE — Telephone Encounter (Signed)
H&V Care Navigation CSW Progress Note  Clinical Social Worker  received paperwork missing for CAFA  to submit for [employer]  and [employer]. Copies mailed to [facility]) and [facility]. Will monitor for any updates.   Patient is participating in a Managed Medicaid Plan:  No, self pay only, CAFA renewal pending.   SDOH Screenings   Food Insecurity: No Food Insecurity (06/12/2022)  Housing: Low Risk  (06/12/2022)  Transportation Needs: No Transportation Needs (06/12/2022)  Depression (PHQ2-9): Low Risk  (02/09/2022)  Financial Resource Strain: High Risk (06/12/2022)  Tobacco Use: Low Risk  (06/04/2022)   Westley Hummer, MSW, [place]  [phone]- work cell phone (preferred) [phone]- desk phone

## 2022-08-05 IMAGING — DX DG CHEST 2V
2 series · 2 of 2 positions shown · non-contrast
Comparison: No comparison studies available.

CLINICAL DATA: Shortness of breath

EXAM:
CHEST - 2 VIEW

[chest pa]
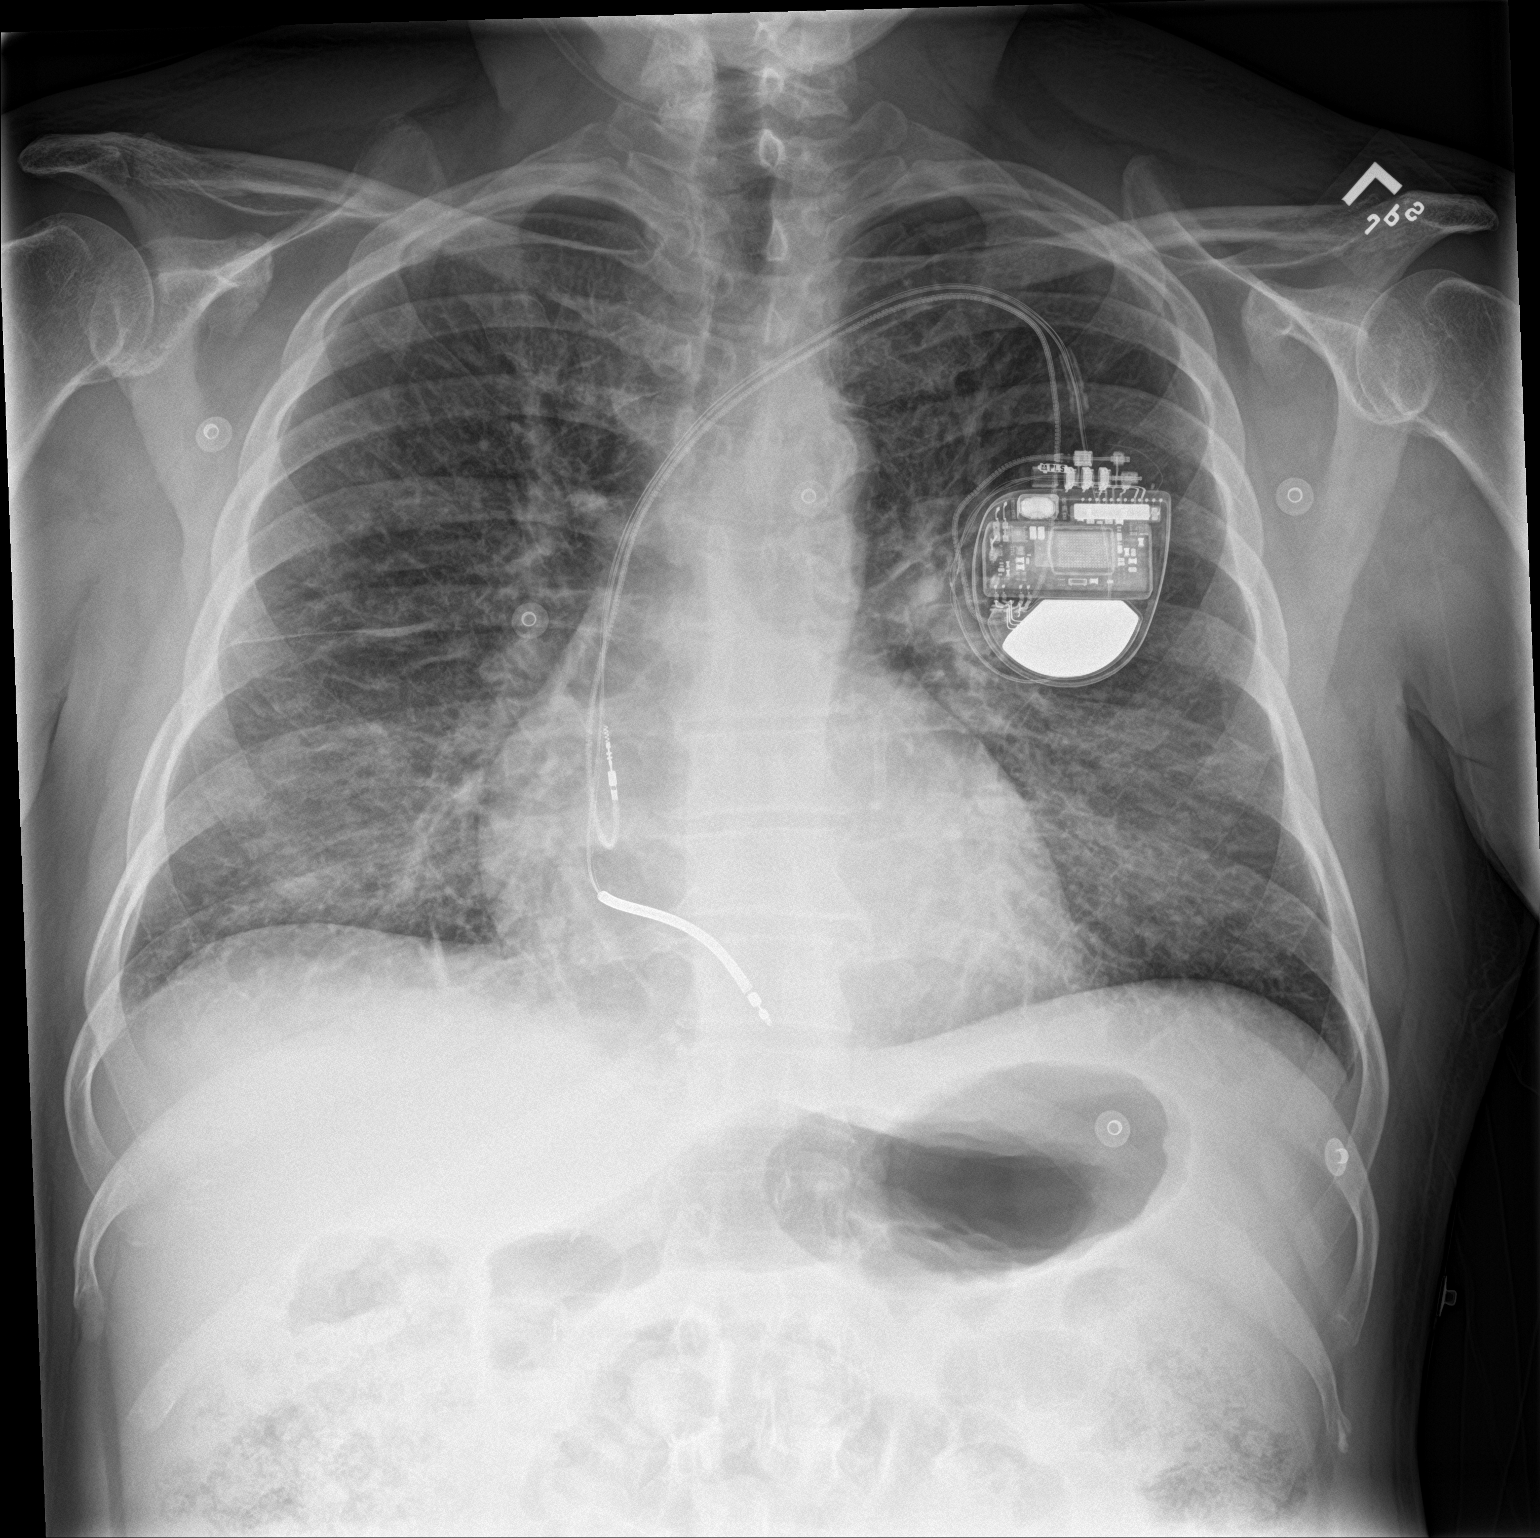

[chest lat]
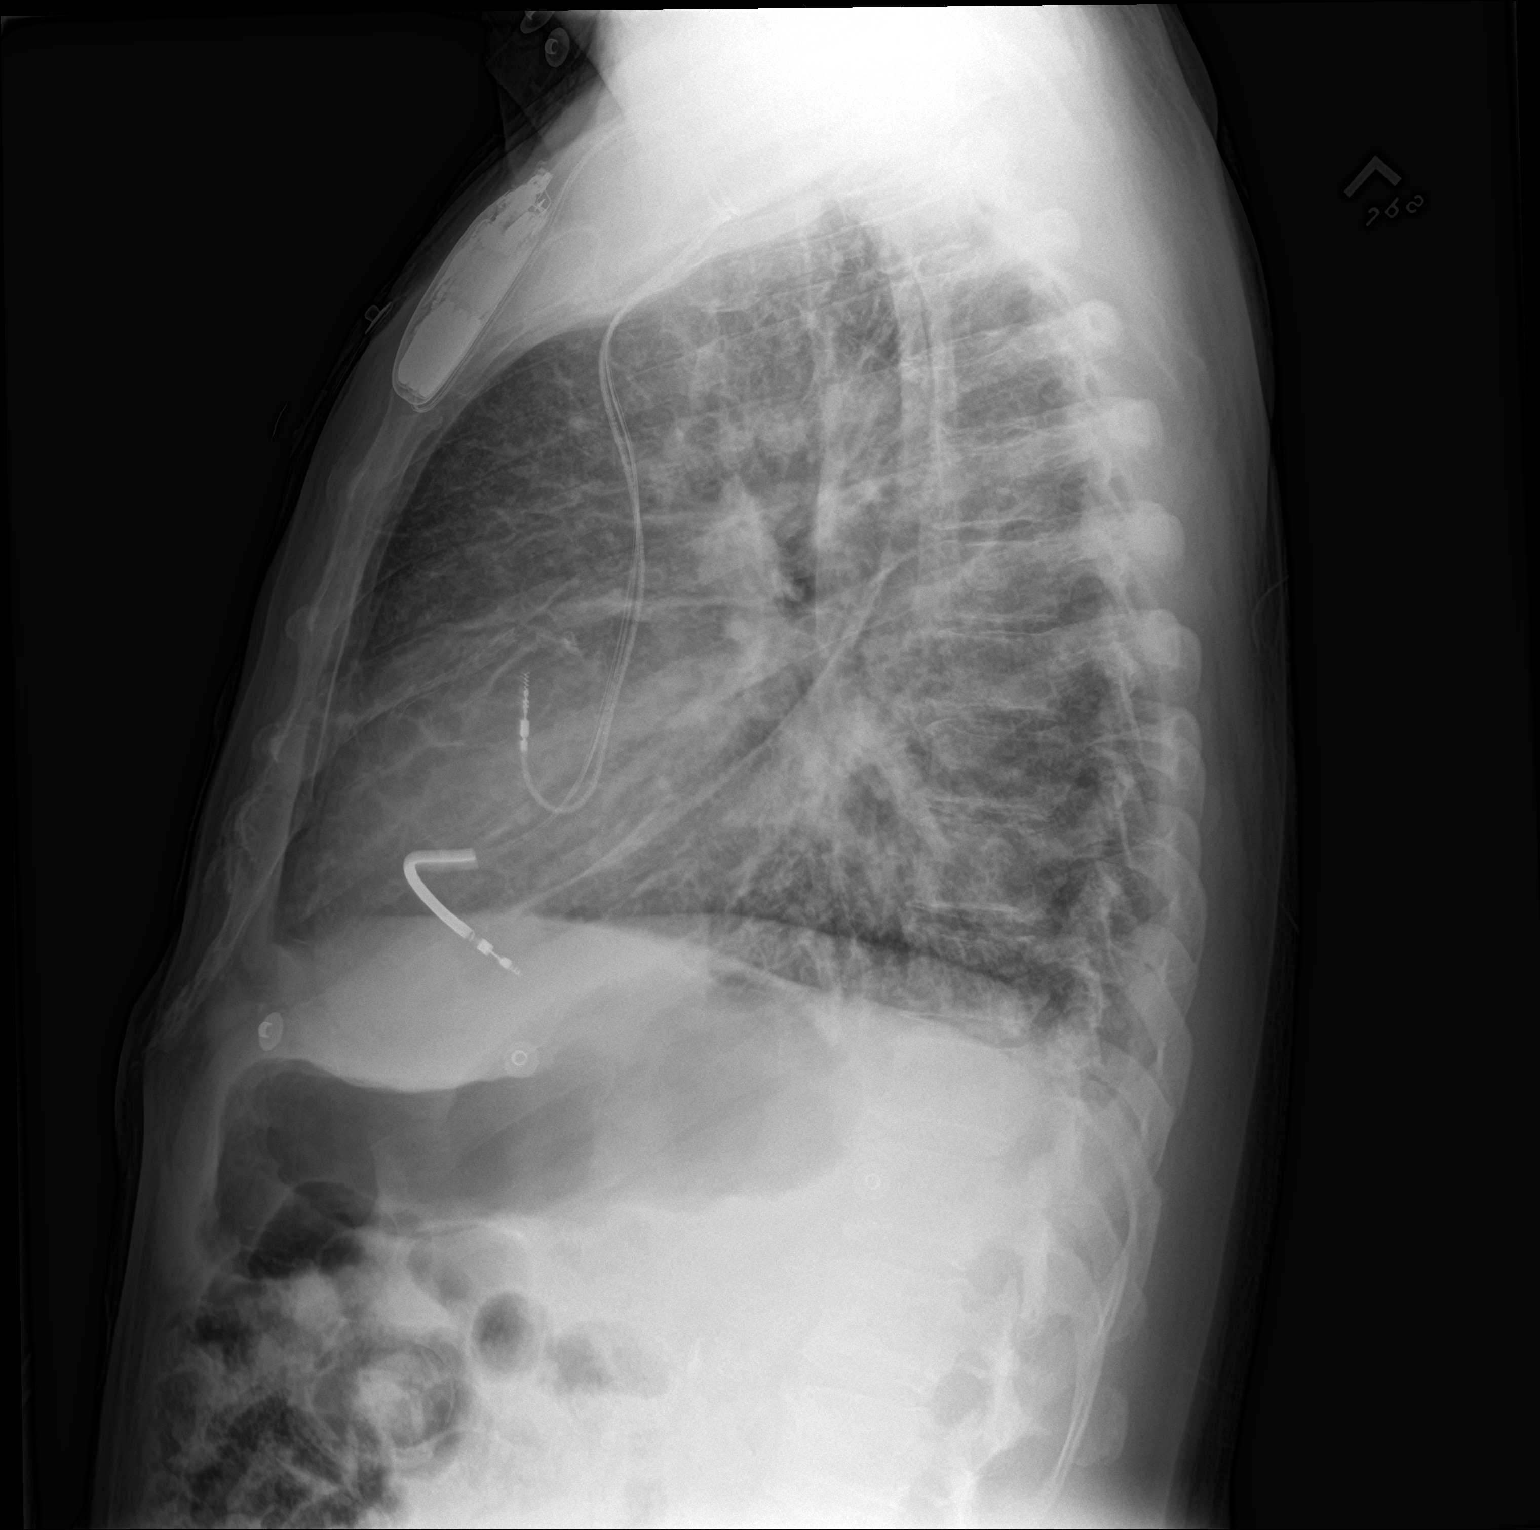

[2 of 2 positions shown; findings below may reference images not displayed]

FINDINGS: The cardiopericardial silhouette is within normal limits for size.
Diffuse interstitial opacity suggests edema. Tiny pleural effusions
noted on the lateral projection. Left-sided permanent pacemaker/AICD
noted.
IMPRESSION: Interstitial pulmonary edema pattern with tiny bilateral pleural
effusions.

## 2022-08-07 ENCOUNTER — Telehealth: Payer: Self-pay | Admitting: Licensed Clinical Social Worker

## 2022-08-07 ENCOUNTER — Other Ambulatory Visit: Payer: Self-pay

## 2022-08-07 NOTE — Telephone Encounter (Signed)
H&V Care Navigation CSW Progress Note  Clinical Social Worker  mailed pt letter from [employer]   to confirm approval. Also emailed [employer] to f/u on their processing of pt application.   CHARITY COMPLETE. PATIENT APPROVED FOR 100% FINANCIAL ASSISTANCE PER CAFA APPLICATION AND SUPPORTING DOCUMENTATION.  CAFA APP SCANNED TO ACCOUNT [id number] APPROVAL DATES OF [employer] ---- 06/04/22 - 12/05/22  APPROVAL LETTER ON ACCOUNT [id number]  Patient is participating in a Managed Medicaid Plan:  no, self pay,  [place]: No Food Insecurity (06/12/2022)  Housing: Low Risk  (06/12/2022)  Transportation Needs: No Transportation Needs (06/12/2022)  Depression (PHQ2-9): Low Risk  (02/09/2022)  Financial Resource Strain: High Risk (06/12/2022)  Tobacco Use: Low Risk  (06/04/2022)   Westley Hummer, MSW, [place]  [phone]- work cell phone (preferred) [phone]- desk phone

## 2022-08-11 ENCOUNTER — Telehealth: Payer: Self-pay | Admitting: Licensed Clinical Social Worker

## 2022-08-11 NOTE — Telephone Encounter (Signed)
H&V Care Navigation CSW Progress Note  Clinical Social Worker  contacted [employer]  to f/u on pt application. They share they need pt ID, securely sent ID on file to program to complete processing application.   Patient is participating in a Managed Medicaid Plan:  No, self pay, CAFA approved.   SDOH Screenings   Food Insecurity: No Food Insecurity (06/12/2022)  Housing: Low Risk  (06/12/2022)  Transportation Needs: No Transportation Needs (06/12/2022)  Depression (PHQ2-9): Low Risk  (02/09/2022)  Financial Resource Strain: High Risk (06/12/2022)  Tobacco Use: Low Risk  (06/04/2022)   Westley Hummer, MSW, [place]  [phone]- work cell phone (preferred) [phone]- desk phone

## 2022-09-07 ENCOUNTER — Other Ambulatory Visit: Payer: Self-pay

## 2022-09-07 ENCOUNTER — Ambulatory Visit: Payer: Self-pay | Attending: Internal Medicine | Admitting: Internal Medicine

## 2022-09-07 ENCOUNTER — Encounter: Payer: Self-pay | Admitting: Internal Medicine

## 2022-09-07 DIAGNOSIS — E119 Type 2 diabetes mellitus without complications: Secondary | ICD-10-CM

## 2022-09-07 DIAGNOSIS — I5023 Acute on chronic systolic (congestive) heart failure: Secondary | ICD-10-CM

## 2022-09-07 DIAGNOSIS — I509 Heart failure, unspecified: Secondary | ICD-10-CM

## 2022-09-07 MED ORDER — EMPAGLIFLOZIN 10 MG PO TABS
10.0000 mg | ORAL_TABLET | Freq: Every day | ORAL | 1 refills | Status: DC
  Filled 2022-09-07: qty 90, 90d supply, fill #0

## 2022-09-07 MED ORDER — SACUBITRIL-VALSARTAN 24-26 MG PO TABS
1.0000 | ORAL_TABLET | Freq: Two times a day (BID) | ORAL | 2 refills | Status: DC
  Filled 2022-09-07: qty 60, 30d supply, fill #0

## 2022-09-07 MED ORDER — SPIRONOLACTONE 25 MG PO TABS
25.0000 mg | ORAL_TABLET | Freq: Every day | ORAL | 3 refills | Status: DC
Start: 2022-09-07 — End: 2022-10-26
  Filled 2022-09-07: qty 90, 90d supply, fill #0

## 2022-09-07 NOTE — Patient Instructions (Signed)
Medication Instructions:   STOP: PLAVIX (CLOPIDOGREL)  STOP: LOSARTAN   START: ENTRESTO 24-24mg  TWICE DAILY   START: SPIRONOLACTONE 25mg  ONCE DAILY   *If you need a refill on your cardiac medications before your next appointment, please call your pharmacy*  Lab Work:  Please return for Blood Work in 2 WEEKS. No appointment needed, lab here at the office is open Monday-Friday from 8AM to 4PM and closed daily for lunch from 12:45-1:45.   If you have labs (blood work) drawn today and your tests are completely normal, you will receive your results only by: MyChart Message (if you have MyChart) OR A paper copy in the mail If you have any lab test that is abnormal or we need to change your treatment, we will call you to review the results.  Testing/Procedures: None Ordered At This Time.   Follow-Up: At [facility], you and your health needs are our priority.  As part of our continuing mission to provide you with exceptional heart care, we have created designated Provider Care Teams.  These Care Teams include your primary Cardiologist (physician) and Advanced Practice Providers (APPs -  Physician Assistants and Nurse Practitioners) who all work together to provide you with the care you need, when you need it.  Your next appointment:   3 month(s)  The format for your next appointment:   In Person  Provider:   [facility], MD    PATIENT NEEDS APPOINTMENT WITH Dr. Maisie Fus IN DEVICE CLINIC- PLEASE SCHEDULE AT NEXT AVAILABLE

## 2022-09-07 NOTE — Progress Notes (Signed)
Cardiology Office Note:    Date:  09/07/2022   ID:  Pedro Kemp, DOB [birth year], MRN [id number]  PCP:  Grayce Sessions, NP   [facility] Providers Cardiologist:  Maisie Fus, MD     Referring MD: Grayce Sessions, NP   No chief complaint on file.  Post Cardiac Arrest  History of Present Illness:    Pedro Kemp is a 66 y.o. male with a hx of T2DM, HLD  spanish speaking requires an interpreter, referral for post hospitalization at [facility] for ischemic VT and cardiac arrest (interpretor Kandis Mannan #[id number])   He was admitted at [facility] 05/14/2021 to 05/22/2021.Pedro Kemp presented to [facility] with chest pain, noted to have high risk NSTEMI and acute CHF with EF 35%. He had troponin up to 15 thousand. He had cath on 7/27 that showed 100% occluded mid LAD s/p shockwave and PCI. He also had disease in his diag s/p PCI. He subsequently had VT arrest 7/29 with ACLS and ROSC. He went back to the cath lab on 7/29 and stent was patent. This was concerning for scar mediated VT.  He had an EP study on 8/2 with inducible sustained monomorphic VT.  Managed with amiodarone.He underwent DC-ICD.  He had a moderate sized hematoma at the ICD site requiring monitoring.  He was also managed for acute respiratory failure 2/2 CHF exacerbation. Crt on discharge was 1.38. A1c was 9 %. He was discharged on asa 81 mg daily,  atorvastatin 80 mg daily, plavix 75 mg daily, lisinopril 20 mg daily, lasix 80 mg daily, metop XL 50 mg daily. Started on Jardiance 10 mg. His A1c is 7.5 %   He had appointments for cardiac rehab, with cardiology at [facility]: Dr Merrily Pew. ICD check with Dr. Lanae Boast in August.   He states that he did not do exercise afterwards. After he left the hospital, he had his ICD checked. He would like to have his care here. He reports no shocks. He has no swelling or pain at the site. He denies exertional chest pain or dyspnea. He denies orthopnea, PND, no LE edema. During the day , he does some work around  the house.  Prior to his hospitalization , he had no problems with the heart. He does not smoke.  He denies palpitations, LH. He gets some dizziness. His vision gets blurry. He has no hx of syncope. He is not working will need medication assistance.  Interim Hx: He says that his medications ran out. He did not return to the pharmacy for refills. He feels well. He has no chest pain or dyspnea on exertion. He denies orthopnea,  PND, or LE edema. He can do his activities without any issues.  Interim Hx 09/07/2022 Went to the ED on September, 5th for vague chest pressure. Felt like heartburn. ECG was atrial paced. He feels better. No heartburn. He notes some SOB only at night. He also notes his ICD is making noise  Wt Readings from Last 3 Encounters:  09/07/22 162 lb (73.5 kg)  06/04/22 157 lb 12.8 oz (71.6 kg)  05/11/22 154 lb (69.9 kg)      Cardiology Studies  05/14/2021-Cath - RFA. 100% occluded LAD s/p Synergy 2.75 x 38 mm DES with shockwave to the LAD. 90% D1 s/p 2.5 x 12 synergy DES  Echo- EF 35%, mild MR, PASP 25 mmHg  S/p DC Medtronic ICD 05/20/2021 AAI-DDD VF zone > 182 bpm ATP x3, 40J x6 VT off Base rate 60 bpm  Past Medical History:  Diagnosis Date   Cardiac arrest [facility])    CHF (congestive heart failure) (HCC)    Coronary artery disease    Diabetes mellitus without complication (HCC)    H/O heart artery stent    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    Past Surgical History:  Procedure Laterality Date   CARDIAC CATHETERIZATION     ICD IMPLANT      Current Medications: No outpatient medications have been marked as taking for the 09/07/22 encounter (Appointment) with Maisie Fus, MD.     Allergies:   Patient has no known allergies.   Social History   Socioeconomic History   Marital status: Single    Spouse name: Not on file   Number of children: Not on file   Years of education: Not on file   Highest education level: Not  on file  Occupational History   Not on file  Tobacco Use   Smoking status: Never    Passive exposure: Never   Smokeless tobacco: Never  Substance and Sexual Activity   Alcohol use: Never   Drug use: Never   Sexual activity: Not Currently    Partners: Female  Other Topics Concern   Not on file  Social History Narrative   Not on file   Social Determinants of Health   Financial Resource Strain: High Risk (06/12/2022)   Overall Financial Resource Strain (CARDIA)    Difficulty of Paying Living Expenses: Hard  Food Insecurity: No Food Insecurity (06/12/2022)   Hunger Vital Sign    Worried About Running Out of Food in the Last Year: Never true    Ran Out of Food in the Last Year: Never true  Transportation Needs: No Transportation Needs (06/12/2022)   PRAPARE - Administrator, Civil Service (Medical): No    Lack of Transportation (Non-Medical): No  Physical Activity: Not on file  Stress: Not on file  Social Connections: Not on file     Family History: The patient's family history includes Diabetes in his brother and mother.  ROS:   Please see the history of present illness.     All other systems reviewed and are negative.  EKGs/Labs/Other Studies Reviewed:    The following studies were reviewed today:   EKG:  EKG is  ordered today.  The ekg ordered today demonstrates  NSR, LAFB, anterior Q waves  09/07/2022- NSR with sinus arrhythmia , LAD  Recent Labs: 01/29/2022: B Natriuretic Peptide 745.4 01/30/2022: Magnesium 1.9 06/23/2022: ALT 23; BUN 42; Creatinine, Ser 1.39; Hemoglobin 12.3; Platelets 154; Potassium 4.6; Sodium 137   Recent Lipid Panel    Component Value Date/Time   CHOL 99 (L) 01/07/2022 1203   TRIG 236 (H) 01/07/2022 1203   HDL 31 (L) 01/07/2022 1203   CHOLHDL 3.2 01/07/2022 1203   LDLCALC 31 01/07/2022 1203     Risk Assessment/Calculations:           Physical Exam:    VS:    Vitals:   09/07/22 1543  BP: (!) 142/80  Pulse: 67   SpO2: 98%      Wt Readings from Last 3 Encounters:  06/04/22 157 lb 12.8 oz (71.6 kg)  05/11/22 154 lb (69.9 kg)  03/04/22 151 lb (68.5 kg)     GEN:  Well nourished, well developed in no acute distress HEENT: Normal NECK: No JVD; No carotid bruits LYMPHATICS: No lymphadenopathy CARDIAC: RRR, no murmurs, rubs, gallops  RESPIRATORY:  Clear to auscultation without rales, wheezing or rhonchi  ABDOMEN: Soft, non-tender, non-distended MUSCULOSKELETAL:  No edema; No deformity  SKIN: Warm and dry NEUROLOGIC:  Alert and oriented x 3 PSYCHIATRIC:  Normal affect   ASSESSMENT:    #Ischemic CM NYHA Class I : EF 35% prescribed GDMT ( AceI, BB, SGLT2). Will advance to entresto if NYHA class progresses. TTE 30-35% 12/16/2021. He is euvolemic and asymptomatic.  on lasix 80 mg daily. Dry weight ~ 69 kg. He is not working and the challenge will be ensuring he can continue to get his medications moving forward.  S/p ICD Medtronic DDD enrolled with remote device checks; was planned to be seen by Dr. Royann Shivers ; however missed the appt. We are working on rescheduling - having challenges with medications which is compromising his compliance; he was seen by Laural Golden for assistance prior - weight is mildly up today, no significant signs of VO. - continue asa 81 mg daily - stopped plavix > 12 months - continue losartan 100 mg daily--> entresto - continue metop XL 50 mg daily - continue jardiance 10 mg daily - add spironoalactone - continue atorvastatin 80 mg daily - continue  lasix 40 mg daily   PLAN:    In order of problems listed above:   Stop plavix Add spironolactone 25 mg daily Change losartan to entresto 24-26 mg Refill jardiance BMET 2 weeks FU with remote device data Follow up 3 months     Medication Adjustments/Labs and Tests Ordered: Current medicines are reviewed at length with the patient today.  Concerns regarding medicines are outlined above.    Signed, Maisie Fus,  MD  09/07/2022 3:25 PM    [facility]

## 2022-09-08 ENCOUNTER — Telehealth: Payer: Self-pay

## 2022-09-08 NOTE — Telephone Encounter (Signed)
-----   Message from Baird Cancer, RN sent at 09/07/2022  4:48 PM EST ----- Regarding: Device Hi, this patient is having some beeping with his device and we are unable to see if he is having downloads or not. Could you help [place] with this? Just want to make sure his device is being followed   Virgina Evener- can you try and get this patient scheduled with Dr. [name] at next available on a device day for follow up?   Thank you  Jenna B. RN

## 2022-09-08 NOTE — Telephone Encounter (Addendum)
Patient does not have a remote monitor to send transmission.   Device Clinic apt made 09/09/2022 @ 3:20 pm. Discussed location, date and time with verbal understanding. Also gave direct phone number if needed.

## 2022-09-09 ENCOUNTER — Ambulatory Visit: Payer: Self-pay | Attending: Interventional Cardiology

## 2022-09-09 ENCOUNTER — Other Ambulatory Visit: Payer: Self-pay

## 2022-09-09 DIAGNOSIS — Z95810 Presence of automatic (implantable) cardiac defibrillator: Secondary | ICD-10-CM

## 2022-09-09 DIAGNOSIS — Z8674 Personal history of sudden cardiac arrest: Secondary | ICD-10-CM

## 2022-09-09 LAB — CUP PACEART INCLINIC DEVICE CHECK
Battery Remaining Longevity: 130 mo
Battery Voltage: 3 V
Brady Statistic AP VP Percent: 0.05 %
Brady Statistic AP VS Percent: 59.96 %
Brady Statistic AS VP Percent: 0.01 %
Brady Statistic AS VS Percent: 39.97 %
Brady Statistic RA Percent Paced: 60.35 %
Brady Statistic RV Percent Paced: 0.09 %
Date Time Interrogation Session: 20231122155119
HighPow Impedance: 53 Ohm
Implantable Lead Connection Status: 753985
Implantable Lead Connection Status: 753985
Implantable Lead Implant Date: 20220220
Implantable Lead Implant Date: 20220220
Implantable Lead Location: 753859
Implantable Lead Location: 753860
Implantable Lead Model: 5076
Implantable Pulse Generator Implant Date: 20220220
Lead Channel Impedance Value: 266 Ohm
Lead Channel Impedance Value: 361 Ohm
Lead Channel Impedance Value: 380 Ohm
Lead Channel Pacing Threshold Amplitude: 0.375 V
Lead Channel Pacing Threshold Amplitude: 0.625 V
Lead Channel Pacing Threshold Amplitude: 0.75 V
Lead Channel Pacing Threshold Pulse Width: 0.4 ms
Lead Channel Pacing Threshold Pulse Width: 0.4 ms
Lead Channel Pacing Threshold Pulse Width: 0.4 ms
Lead Channel Sensing Intrinsic Amplitude: 1.8 mV
Lead Channel Sensing Intrinsic Amplitude: 15.5 mV
Lead Channel Setting Pacing Amplitude: 1.5 V
Lead Channel Setting Pacing Amplitude: 2 V
Lead Channel Setting Pacing Pulse Width: 0.4 ms
Lead Channel Setting Sensing Sensitivity: 0.3 mV
Zone Setting Status: 755011
Zone Setting Status: 755011
Zone Setting Status: 755011

## 2022-09-09 NOTE — Progress Notes (Signed)
In clinic check due to device alarm, Alarm for unsuccessful carelink transmission.   Normal device function. Thresholds and sensing consistent with previous device measurements. Impedance trends stable over time. 9 AT/Af episodes 0.1% burden since 09/02/21. EGMs illustrate AF longest episode 5hrs38min in duration, per cardiac compass trends patient had AF episodes 09/2021  No ventricular arrhythmias. Histogram distribution appropriate for patient and level of activity. No changes made this session. Device programmed at appropriate safety margins. Device programmed to optimize intrinsic conduction. Estimated longevity 10 years. Pt educated about remote monitoring patient has not plugged monitor advised patient to plug monitor in as this was the reason his device was alarming.  Patients scheduled to Dr. Royann Shivers 11/09/2022

## 2022-10-23 ENCOUNTER — Other Ambulatory Visit: Payer: Self-pay

## 2022-10-23 ENCOUNTER — Inpatient Hospital Stay (HOSPITAL_COMMUNITY)
Admission: EM | Admit: 2022-10-23 | Discharge: 2022-10-26 | DRG: 291 | Disposition: A | Discharge status: Home / Self Care | Payer: Self-pay | Attending: Family Medicine | Admitting: Family Medicine

## 2022-10-23 ENCOUNTER — Emergency Department (HOSPITAL_COMMUNITY): Payer: Self-pay

## 2022-10-23 DIAGNOSIS — Z955 Presence of coronary angioplasty implant and graft: Secondary | ICD-10-CM

## 2022-10-23 DIAGNOSIS — Z95810 Presence of automatic (implantable) cardiac defibrillator: Secondary | ICD-10-CM

## 2022-10-23 DIAGNOSIS — N1831 Chronic kidney disease, stage 3a: Secondary | ICD-10-CM | POA: Diagnosis present

## 2022-10-23 DIAGNOSIS — I10 Essential (primary) hypertension: Secondary | ICD-10-CM | POA: Diagnosis present

## 2022-10-23 DIAGNOSIS — E119 Type 2 diabetes mellitus without complications: Secondary | ICD-10-CM

## 2022-10-23 DIAGNOSIS — Z8674 Personal history of sudden cardiac arrest: Secondary | ICD-10-CM

## 2022-10-23 DIAGNOSIS — J9811 Atelectasis: Secondary | ICD-10-CM | POA: Diagnosis present

## 2022-10-23 DIAGNOSIS — Z79899 Other long term (current) drug therapy: Secondary | ICD-10-CM

## 2022-10-23 DIAGNOSIS — Z7982 Long term (current) use of aspirin: Secondary | ICD-10-CM

## 2022-10-23 DIAGNOSIS — Z833 Family history of diabetes mellitus: Secondary | ICD-10-CM

## 2022-10-23 DIAGNOSIS — I509 Heart failure, unspecified: Principal | ICD-10-CM

## 2022-10-23 DIAGNOSIS — Z1152 Encounter for screening for COVID-19: Secondary | ICD-10-CM

## 2022-10-23 DIAGNOSIS — E1169 Type 2 diabetes mellitus with other specified complication: Secondary | ICD-10-CM

## 2022-10-23 DIAGNOSIS — R7989 Other specified abnormal findings of blood chemistry: Secondary | ICD-10-CM | POA: Diagnosis present

## 2022-10-23 DIAGNOSIS — Z91148 Patient's other noncompliance with medication regimen for other reason: Secondary | ICD-10-CM

## 2022-10-23 DIAGNOSIS — I5023 Acute on chronic systolic (congestive) heart failure: Secondary | ICD-10-CM | POA: Diagnosis present

## 2022-10-23 DIAGNOSIS — E1122 Type 2 diabetes mellitus with diabetic chronic kidney disease: Secondary | ICD-10-CM | POA: Diagnosis present

## 2022-10-23 DIAGNOSIS — I2510 Atherosclerotic heart disease of native coronary artery without angina pectoris: Secondary | ICD-10-CM | POA: Diagnosis present

## 2022-10-23 DIAGNOSIS — R0902 Hypoxemia: Secondary | ICD-10-CM | POA: Diagnosis present

## 2022-10-23 DIAGNOSIS — E785 Hyperlipidemia, unspecified: Secondary | ICD-10-CM | POA: Diagnosis present

## 2022-10-23 DIAGNOSIS — Z7984 Long term (current) use of oral hypoglycemic drugs: Secondary | ICD-10-CM

## 2022-10-23 DIAGNOSIS — I130 Hypertensive heart and chronic kidney disease with heart failure and stage 1 through stage 4 chronic kidney disease, or unspecified chronic kidney disease: Principal | ICD-10-CM | POA: Diagnosis present

## 2022-10-23 LAB — CBC WITH DIFFERENTIAL/PLATELET
Abs Immature Granulocytes: 0.03 10*3/uL (ref 0.00–0.07)
Basophils Absolute: 0.1 10*3/uL (ref 0.0–0.1)
Basophils Relative: 1 %
Eosinophils Absolute: 2 10*3/uL — ABNORMAL HIGH (ref 0.0–0.5)
Eosinophils Relative: 19 %
HCT: 44.2 % (ref 39.0–52.0)
Hemoglobin: 14.5 g/dL (ref 13.0–17.0)
Immature Granulocytes: 0 %
Lymphocytes Relative: 32 %
Lymphs Abs: 3.3 10*3/uL (ref 0.7–4.0)
MCH: 29.8 pg (ref 26.0–34.0)
MCHC: 32.8 g/dL (ref 30.0–36.0)
MCV: 90.8 fL (ref 80.0–100.0)
Monocytes Absolute: 0.5 10*3/uL (ref 0.1–1.0)
Monocytes Relative: 4 %
Neutro Abs: 4.6 10*3/uL (ref 1.7–7.7)
Neutrophils Relative %: 44 %
Platelets: 176 10*3/uL (ref 150–400)
RBC: 4.87 MIL/uL (ref 4.22–5.81)
RDW: 12.9 % (ref 11.5–15.5)
WBC: 10.5 10*3/uL (ref 4.0–10.5)
nRBC: 0 % (ref 0.0–0.2)

## 2022-10-23 LAB — COMPREHENSIVE METABOLIC PANEL
ALT: 26 U/L (ref 0–44)
AST: 26 U/L (ref 15–41)
Albumin: 3.8 g/dL (ref 3.5–5.0)
Alkaline Phosphatase: 79 U/L (ref 38–126)
Anion gap: 11 (ref 5–15)
BUN: 26 mg/dL — ABNORMAL HIGH (ref 8–23)
CO2: 20 mmol/L — ABNORMAL LOW (ref 22–32)
Calcium: 8.6 mg/dL — ABNORMAL LOW (ref 8.9–10.3)
Chloride: 105 mmol/L (ref 98–111)
Creatinine, Ser: 1.25 mg/dL — ABNORMAL HIGH (ref 0.61–1.24)
GFR, Estimated: >60 mL/min (ref >60)
Glucose, Bld: 193 mg/dL — ABNORMAL HIGH (ref 70–99)
Potassium: 3.6 mmol/L (ref 3.5–5.1)
Sodium: 136 mmol/L (ref 135–145)
Total Bilirubin: 0.6 mg/dL (ref 0.3–1.2)
Total Protein: 7 g/dL (ref 6.5–8.1)

## 2022-10-23 LAB — RESP PANEL BY RT-PCR (RSV, FLU A&B, COVID)  RVPGX2
Influenza A by PCR: NEGATIVE
Influenza B by PCR: NEGATIVE
Resp Syncytial Virus by PCR: NEGATIVE
SARS Coronavirus 2 by RT PCR: NEGATIVE

## 2022-10-23 LAB — BRAIN NATRIURETIC PEPTIDE: B Natriuretic Peptide: 729.5 pg/mL — ABNORMAL HIGH (ref 0.0–100.0)

## 2022-10-23 LAB — TROPONIN I (HIGH SENSITIVITY): Troponin I (High Sensitivity): 152 ng/L (ref <18)

## 2022-10-23 MED ORDER — FUROSEMIDE 10 MG/ML IJ SOLN
40.0000 mg | Freq: Once | INTRAMUSCULAR | Status: AC
  Administered 2022-10-23: 40 mg via INTRAVENOUS
  Filled 2022-10-23: qty 4

## 2022-10-23 MED ORDER — METOPROLOL TARTRATE 5 MG/5ML IV SOLN: 5.0000 mg | Freq: Once | INTRAVENOUS | Status: DC

## 2022-10-23 MED ORDER — METOPROLOL TARTRATE 25 MG PO TABS
50.0000 mg | ORAL_TABLET | Freq: Once | ORAL | Status: AC
  Administered 2022-10-23: 50 mg via ORAL
  Filled 2022-10-23: qty 2

## 2022-10-23 NOTE — ED Triage Notes (Signed)
Patient reports SOB this evening with chest tightness/wheezing . History of CAD/Pacemaker .

## 2022-10-23 NOTE — ED Provider Notes (Incomplete)
[place] EMERGENCY DEPARTMENT Provider Note   CSN: [id number] Arrival date & time: 10/23/22  2150     History {Add pertinent medical, surgical, social history, OB history to HPI:1} Chief Complaint  Patient presents with  . Shortness of Breath    Francisca Harbuck is a 67 y.o. male.   Shortness of Breath      Home Medications Prior to Admission medications   Medication Sig Start Date End Date Taking? Authorizing Provider  aspirin EC 81 MG tablet Take 81 mg by mouth daily. Swallow whole.    [provider]  atorvastatin (LIPITOR) 20 MG tablet Take 1 tablet (20 mg total) by mouth daily. 01/07/22   Janina Mayo, MD  empagliflozin (JARDIANCE) 10 MG TABS tablet Take 1 tablet (10 mg total) by mouth daily before breakfast. 09/07/22   Janina Mayo, MD  furosemide (LASIX) 40 MG tablet Take 1 tablet (40 mg total) by mouth daily. 06/04/22   Janina Mayo, MD  metFORMIN (GLUCOPHAGE-XR) 500 MG 24 hr tablet Take 1 tablet (500 mg total) by mouth daily with breakfast. 01/07/22   Janina Mayo, MD  metoprolol succinate (TOPROL-XL) 50 MG 24 hr tablet Take 1 tablet (50 mg total) by mouth daily. 06/04/22   Janina Mayo, MD  sacubitril-valsartan (ENTRESTO) 24-26 MG Take 1 tablet by mouth 2 (two) times daily. 09/07/22   Janina Mayo, MD  spironolactone (ALDACTONE) 25 MG tablet Take 1 tablet (25 mg total) by mouth daily. 09/07/22   Janina Mayo, MD      Allergies    Patient has no known allergies.    Review of Systems   Review of Systems  Respiratory:  Positive for shortness of breath.     Physical Exam Updated Vital Signs BP (!) 172/92 (BP Location: Right Arm)   Pulse (!) 107   Temp 97.7 F (36.5 C)   Resp (!) 23   SpO2 100%  Physical Exam  ED Results / Procedures / Treatments   Labs (all labs ordered are listed, but only abnormal results are displayed) Labs Reviewed  BRAIN NATRIURETIC PEPTIDE - Abnormal; Notable for the following components:       Result Value   B Natriuretic Peptide 729.5 (*)    All other components within normal limits  CBC WITH DIFFERENTIAL/PLATELET - Abnormal; Notable for the following components:   Eosinophils Absolute 2.0 (*)    All other components within normal limits  RESP PANEL BY RT-PCR (RSV, FLU A&B, COVID)  RVPGX2  COMPREHENSIVE METABOLIC PANEL  I-STAT VENOUS BLOOD GAS, ED  TROPONIN I (HIGH SENSITIVITY)  TROPONIN I (HIGH SENSITIVITY)    EKG EKG Interpretation  Date/Time:  Friday October 23 2022 22:08:57 EST Ventricular Rate:  95 PR Interval:  160 QRS Duration: 108 QT Interval:  356 QTC Calculation: 447 R Axis:   -22 Text Interpretation: Normal sinus rhythm Minimal voltage criteria for LVH, may be normal variant ( Cornell product ) ST & T wave abnormality, consider lateral ischemia Abnormal ECG Baseline wander TECHNICALLY DIFFICULT Otherwise no significant change Confirmed by Deno Etienne [phone]) on 10/23/2022 10:38:34 PM  Radiology DG Chest 2 View  Result Date: 10/23/2022 CLINICAL DATA:  Shortness of breath and chest tightness. EXAM: CHEST - 2 VIEW COMPARISON:  June 23, 2022 FINDINGS: There is stable dual lead AICD positioning. The heart size and mediastinal contours are within normal limits. A coronary artery stent is in place. There is marked severity calcification of the aortic arch. Mild  diffusely increased interstitial lung markings are seen. Mild linear atelectasis is noted within the mid right lung. There is no evidence of a pleural effusion or pneumothorax. The visualized skeletal structures are unremarkable. IMPRESSION: Mild interstitial edema with mild linear atelectasis within the mid right lung. Electronically Signed   By: Virgina Norfolk M.D.   On: 10/23/2022 22:34    Procedures Procedures  {Document cardiac monitor, telemetry assessment procedure when appropriate:1}  Medications Ordered in ED Medications  furosemide (LASIX) injection 40 mg (has no administration in time range)   metoprolol tartrate (LOPRESSOR) tablet 50 mg (has no administration in time range)    ED Course/ Medical Decision Making/ A&P                           Medical Decision Making Risk Prescription drug management.   ***  {Document critical care time when appropriate:1} {Document review of labs and clinical decision tools ie heart score, Chads2Vasc2 etc:1}  {Document your independent review of radiology images, and any outside records:1} {Document your discussion with family members, caretakers, and with consultants:1} {Document social determinants of health affecting pt's care:1} {Document your decision making why or why not admission, treatments were needed:1} Final Clinical Impression(s) / ED Diagnoses Final diagnoses:  None    Rx / DC Orders ED Discharge Orders     None

## 2022-10-23 NOTE — ED Notes (Signed)
Critical trop of 152 communicated to [facility] MD at this time

## 2022-10-23 NOTE — ED Provider Notes (Signed)
[place] EMERGENCY DEPARTMENT Provider Note   CSN: [id number] Arrival date & time: 10/23/22  2150     History  Chief Complaint  Patient presents with   Shortness of Breath    Pedro Kemp is a 67 y.o. male.   Shortness of Breath Patient is a 67 year old male with past medical history significant for V. tach arrest, CAD status post stent, DM 2, CHF with last echo done in February 2023 with an EF of 30% follows with Dr. Harl Bowie of cardiology  Present emergency room today with complaints of shortness of breath.  He states that he is not sure what medications he is taking.  He certainly has not been taking all the medications he is prescribed since he states that he only takes 2 and 1 of these is aspirin.  Denies any chest pain lightheadedness or dizziness.  No vomiting nausea fevers chills.  Denies any significant new coughing.     Home Medications Prior to Admission medications   Medication Sig Start Date End Date Taking? Authorizing Provider  aspirin EC 81 MG tablet Take 81 mg by mouth daily. Swallow whole.   Yes [provider]  atorvastatin (LIPITOR) 20 MG tablet Take 1 tablet (20 mg total) by mouth daily. 01/07/22  Yes Janina Mayo, MD  spironolactone (ALDACTONE) 25 MG tablet Take 1 tablet (25 mg total) by mouth daily. 09/07/22  Yes Janina Mayo, MD  empagliflozin (JARDIANCE) 10 MG TABS tablet Take 1 tablet (10 mg total) by mouth daily before breakfast. Patient not taking: Reported on 10/24/2022 09/07/22   Janina Mayo, MD  furosemide (LASIX) 40 MG tablet Take 1 tablet (40 mg total) by mouth daily. Patient not taking: Reported on 10/24/2022 06/04/22   Janina Mayo, MD  metFORMIN (GLUCOPHAGE-XR) 500 MG 24 hr tablet Take 1 tablet (500 mg total) by mouth daily with breakfast. Patient not taking: Reported on 10/24/2022 01/07/22   Janina Mayo, MD  metoprolol succinate (TOPROL-XL) 50 MG 24 hr tablet Take 1 tablet (50 mg total) by mouth  daily. Patient not taking: Reported on 10/24/2022 06/04/22   Janina Mayo, MD  sacubitril-valsartan (ENTRESTO) 24-26 MG Take 1 tablet by mouth 2 (two) times daily. Patient not taking: Reported on 10/24/2022 09/07/22   Janina Mayo, MD      Allergies    Patient has no known allergies.    Review of Systems   Review of Systems  Respiratory:  Positive for shortness of breath.     Physical Exam Updated Vital Signs BP 126/73   Pulse 64   Temp 97.8 F (36.6 C) (Oral)   Resp 20   SpO2 99%  Physical Exam Vitals and nursing note reviewed.  Constitutional:      General: He is not in acute distress. HENT:     Head: Normocephalic and atraumatic.     Nose: Nose normal.     Mouth/Throat:     Mouth: Mucous membranes are moist.  Eyes:     General: No scleral icterus. Cardiovascular:     Rate and Rhythm: Normal rate and regular rhythm.     Pulses: Normal pulses.     Heart sounds: Normal heart sounds.  Pulmonary:     Effort: Pulmonary effort is normal. No respiratory distress.     Breath sounds: No wheezing.     Comments: Speaking in full sentences, faint crackles auscultated at bases Abdominal:     Palpations: Abdomen is soft.  Tenderness: There is no abdominal tenderness. There is no guarding or rebound.  Musculoskeletal:     Cervical back: Normal range of motion.     Right lower leg: No edema.     Left lower leg: No edema.     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     Capillary Refill: Capillary refill takes less than 2 seconds.  Neurological:     Mental Status: He is alert. Mental status is at baseline.  Psychiatric:        Mood and Affect: Mood normal.        Behavior: Behavior normal.     ED Results / Procedures / Treatments   Labs (all labs ordered are listed, but only abnormal results are displayed) Labs Reviewed  COMPREHENSIVE METABOLIC PANEL -  Abnormal; Notable for the following components:      Result Value   CO2 20 (*)    Glucose, Bld 193 (*)    BUN 26 (*)    Creatinine, Ser 1.25 (*)    Calcium 8.6 (*)    All other components within normal limits  BRAIN NATRIURETIC PEPTIDE - Abnormal; Notable for the following components:   B Natriuretic Peptide 729.5 (*)    All other components within normal limits  CBC WITH DIFFERENTIAL/PLATELET - Abnormal; Notable for the following components:   Eosinophils Absolute 2.0 (*)    All other components within normal limits  I-STAT VENOUS BLOOD GAS, ED - Abnormal; Notable for the following components:   pH, Ven 7.235 (*)    Acid-base deficit 7.0 (*)    All other components within normal limits  TROPONIN I (HIGH SENSITIVITY) - Abnormal; Notable for the following components:   Troponin I (High Sensitivity) 152 (*)    All other components within normal limits  TROPONIN I (HIGH SENSITIVITY) - Abnormal; Notable for the following components:   Troponin I (High Sensitivity) 163 (*)    All other components within normal limits  RESP PANEL BY RT-PCR (RSV, FLU A&B, COVID)  RVPGX2    EKG EKG Interpretation  Date/Time:  Friday October 23 2022 22:08:57 EST Ventricular Rate:  95 PR Interval:  160 QRS Duration: 108 QT Interval:  356 QTC Calculation: 447 R Axis:   -22 Text Interpretation: Normal sinus rhythm Minimal voltage criteria for LVH, may be normal variant ( Cornell product ) ST & T wave abnormality, consider lateral ischemia Abnormal ECG Baseline wander TECHNICALLY DIFFICULT Otherwise no significant change Confirmed by Melene Plan [phone]) on 10/23/2022 10:38:34 PM  Radiology DG Chest 2 View  Result Date: 10/23/2022 CLINICAL DATA:  Shortness of breath and chest tightness. EXAM: CHEST - 2 VIEW COMPARISON:  June 23, 2022 FINDINGS: There is stable dual lead AICD positioning. The heart size and mediastinal contours are within normal limits. A coronary artery stent is in place. There is marked  severity calcification of the aortic arch. Mild diffusely increased interstitial lung markings are seen. Mild linear atelectasis is noted within the mid right lung. There is no evidence of a pleural effusion or pneumothorax. The visualized skeletal structures are unremarkable. IMPRESSION: Mild interstitial edema with mild linear atelectasis within the mid right lung. Electronically Signed   By: Aram Candela M.D.   On: 10/23/2022 22:34    Procedures .Critical Care  Performed by: Gailen Shelter, PA Authorized by: Gailen Shelter, PA   Critical care provider statement:  Critical care time (minutes):  35   Critical care time was exclusive of:  Separately billable procedures and treating other patients and teaching time   Critical care was necessary to treat or prevent imminent or life-threatening deterioration of the following conditions:  Respiratory failure   Critical care was time spent personally by me on the following activities:  Development of treatment plan with patient or surrogate, review of old charts, re-evaluation of patient's condition, pulse oximetry, ordering and review of radiographic studies, ordering and review of laboratory studies, ordering and performing treatments and interventions, obtaining history from patient or surrogate, examination of patient and evaluation of patient's response to treatment   Care discussed with: admitting provider       Medications Ordered in ED Medications  furosemide (LASIX) injection 40 mg (40 mg Intravenous Given 10/23/22 2348)  metoprolol tartrate (LOPRESSOR) tablet 50 mg (50 mg Oral Given 10/23/22 2348)    ED Course/ Medical Decision Making/ A&P                           Medical Decision Making Risk Prescription drug management. Decision regarding hospitalization.   This patient presents to the ED for concern of shortness of breath., this involves a number of treatment options, and is a complaint that carries with it a moderate  to high risk of complications and morbidity. A differential diagnosis was considered for the patient's symptoms which is discussed below:   The causes for shortness of breath include but are not limited to Cardiac (AHF, pericardial effusion and tamponade, arrhythmias, ischemia, etc) Respiratory (COPD, asthma, pneumonia, pneumothorax, primary pulmonary hypertension, PE/VQ mismatch) Hematological (anemia) Neuromuscular (ALS, Guillain-Barr, etc)    Co morbidities: Discussed in HPI   Brief History:  Patient is a 67 year old male with past medical history significant for V. tach arrest, CAD status post stent, DM 2, CHF with last echo done in February 2023 with an EF of 30% follows with Dr. Wyline Mood of cardiology  Present emergency room today with complaints of shortness of breath.  He states that he is not sure what medications he is taking.  He certainly has not been taking all the medications he is prescribed since he states that he only takes 2 and 1 of these is aspirin.  Denies any chest pain lightheadedness or dizziness.  No vomiting nausea fevers chills.  Denies any significant new coughing.    EMR reviewed including pt PMHx, past surgical history and past visits to ER.   See HPI for more details   Lab Tests:   I ordered and independently interpreted labs. Labs notable for Troponin elevation likely secondary to some increased demand in the setting of CHF exacerbation.  I-STAT VBG without any significant acidosis, BNP elevated at 730 consistent with heart failure exacerbation, COVID influenza negative CBC without significant abnormal findings, borderline leukocytosis.  CMP at baseline  Imaging Studies:  Abnormal findings. I personally reviewed all imaging studies. Imaging notable for  IMPRESSION:  Mild interstitial edema with mild linear atelectasis within the mid  right lung.      Electronically Signed    By: Aram Candela M.D.    On: 10/23/2022 22:34    Cardiac  Monitoring:  The patient was maintained on a cardiac monitor.  I personally viewed and interpreted the cardiac monitored which showed an underlying rhythm of: NSR EKG non-ischemic   Medicines ordered:  I ordered medication including Lasix 40 mg IV, metoprolol 50 mg p.o. for  hypervolemia, hypertension Reevaluation of the patient after these medicines showed that the patient improved I have reviewed the patients home medicines and have made adjustments as needed   Critical Interventions:   supplemental oxygen for hypoxia   Consults/Attending Physician   I requested consultation with hospitalist Dr. Marcello Moores,  and discussed lab and imaging findings as well as pertinent plan - they recommend: admission   I discussed this case with my attending physician who cosigned this note including patient's presenting symptoms, physical exam, and planned diagnostics and interventions. Attending physician stated agreement with plan or made changes to plan which were implemented.   Attending physician assessed patient at bedside.     Reevaluation:  After the interventions noted above I re-evaluated patient and found that they have :improved   Social Determinants of Health:      Problem List / ED Course:  Patient with CFH exacerbation in the setting of noncompliance with his Lasix.  He is not taking his medications as prescribed and developed shortness of breath.  I have a relatively low suspicion for PE as he has no chest pain no hemoptysis and has a presentation consistent with CHF exacerbation which is a known disease for him.  He is hypoxic to 82% on room air.  Corrected to normal with 6 L initially has been titrated down to 4 L at the time of the signing of this note.  Has made approximately 1 L of urine after the 40 mg of IV Lasix.   Dispostion:  After consideration of the diagnostic results and the patients response to treatment, I feel that the patent would benefit from  admission   Final Clinical Impression(s) / ED Diagnoses Final diagnoses:  Congestive heart failure, unspecified HF chronicity, unspecified heart failure type [facility])  Hypoxia    Rx / DC Orders ED Discharge Orders     None         Tedd Sias, [place] 10/24/22 0401    Ripley Fraise, MD 10/24/22 [phone]

## 2022-10-23 NOTE — ED Provider Triage Note (Signed)
Emergency Medicine Provider Triage Evaluation Note  Abdias Hickam , a 67 y.o. male  was evaluated in triage.  Pt complains of shortness of breath.  Patient had acute onset shortness of breath earlier today.  Reports no oxygen at baseline but was saturating mid to low 80s on room air upon initial presentation.  Reports some feelings of chest tightness of which has been constant since onset.  History of CAD as well as CHF, cardiac arrest with pacemaker placement.  States that he experienced no defibrillation.  Denies cough, congestion, fever, chills, night sweats, abdominal pain, nausea, vomiting.  Review of Systems  Positive: See above Negative:   Physical Exam  BP (!) 189/118   Pulse 68   Temp 97.7 F (36.5 C)   SpO2 (!) 82%  Gen:   Awake, no distress   Resp:  Normal effort  MSK:   Moves extremities without difficulty  Other:  Patient with labored breathing with oxygen saturation of 87 on 3 L.  Oxygen was increased to 6 L with adequate response of 96%.  Patient with diffuse wheeze/rhonchi.  No lower extremity edema appreciated.  Medical Decision Making  Medically screening exam initiated at 10:04 PM.  Appropriate orders placed.  Sotero [name] was informed that the remainder of the evaluation will be completed by another provider, this initial triage assessment does not replace that evaluation, and the importance of remaining in the ED until their evaluation is complete.     Wilnette Kales, [place] 10/23/22 2206

## 2022-10-24 ENCOUNTER — Inpatient Hospital Stay (HOSPITAL_COMMUNITY): Payer: Self-pay

## 2022-10-24 ENCOUNTER — Encounter (HOSPITAL_COMMUNITY): Payer: Self-pay | Admitting: Internal Medicine

## 2022-10-24 DIAGNOSIS — I5023 Acute on chronic systolic (congestive) heart failure: Secondary | ICD-10-CM | POA: Diagnosis present

## 2022-10-24 LAB — ECHOCARDIOGRAM COMPLETE
AR max vel: 1.95 cm2
AV Area VTI: 1.87 cm2
AV Area mean vel: 1.84 cm2
AV Mean grad: 3 mmHg
AV Peak grad: 5.2 mmHg
Ao pk vel: 1.14 m/s
Area-P 1/2: 5.13 cm2
Calc EF: 37.1 %
MV M vel: 4.37 m/s
MV Peak grad: 76.4 mmHg
Radius: 0.4 cm
S' Lateral: 4.9 cm
Single Plane A2C EF: 41.8 %
Single Plane A4C EF: 32.5 %

## 2022-10-24 LAB — I-STAT VENOUS BLOOD GAS, ED
Acid-base deficit: 7 mmol/L — ABNORMAL HIGH (ref 0.0–2.0)
Bicarbonate: 20.4 mmol/L (ref 20.0–28.0)
Calcium, Ion: 1.19 mmol/L (ref 1.15–1.40)
HCT: 43 % (ref 39.0–52.0)
Hemoglobin: 14.6 g/dL (ref 13.0–17.0)
O2 Saturation: 72 %
Potassium: 3.8 mmol/L (ref 3.5–5.1)
Sodium: 140 mmol/L (ref 135–145)
TCO2: 22 mmol/L (ref 22–32)
pCO2, Ven: 48.2 mmHg (ref 44–60)
pH, Ven: 7.235 — ABNORMAL LOW (ref 7.25–7.43)
pO2, Ven: 45 mmHg (ref 32–45)

## 2022-10-24 LAB — GLUCOSE, CAPILLARY: Glucose-Capillary: 174 mg/dL — ABNORMAL HIGH (ref 70–99)

## 2022-10-24 LAB — TROPONIN I (HIGH SENSITIVITY): Troponin I (High Sensitivity): 163 ng/L (ref <18)

## 2022-10-24 LAB — CBG MONITORING, ED
Glucose-Capillary: 255 mg/dL — ABNORMAL HIGH (ref 70–99)
Glucose-Capillary: 74 mg/dL (ref 70–99)
Glucose-Capillary: 126 mg/dL — ABNORMAL HIGH (ref 70–99)

## 2022-10-24 MED ORDER — ONDANSETRON HCL 4 MG/2ML IJ SOLN: 4.0000 mg | Freq: Four times a day (QID) | INTRAMUSCULAR | Status: DC | PRN

## 2022-10-24 MED ORDER — INSULIN ASPART 100 UNIT/ML IJ SOLN
0.0000 [IU] | Freq: Three times a day (TID) | INTRAMUSCULAR | Status: DC
  Administered 2022-10-24: 8 [IU] via SUBCUTANEOUS
  Administered 2022-10-25: 3 [IU] via SUBCUTANEOUS
  Administered 2022-10-25: 5 [IU] via SUBCUTANEOUS
  Administered 2022-10-25 – 2022-10-26 (×2): 3 [IU] via SUBCUTANEOUS
  Administered 2022-10-26: 5 [IU] via SUBCUTANEOUS

## 2022-10-24 MED ORDER — DOCUSATE SODIUM 100 MG PO CAPS
100.0000 mg | ORAL_CAPSULE | Freq: Two times a day (BID) | ORAL | Status: DC
  Administered 2022-10-24 – 2022-10-25 (×3): 100 mg via ORAL
  Filled 2022-10-24 (×3): qty 1

## 2022-10-24 MED ORDER — INSULIN ASPART 100 UNIT/ML IJ SOLN: 0.0000 [IU] | Freq: Every day | INTRAMUSCULAR | Status: DC

## 2022-10-24 MED ORDER — BISACODYL 5 MG PO TBEC: 5.0000 mg | DELAYED_RELEASE_TABLET | Freq: Every day | ORAL | Status: DC | PRN

## 2022-10-24 MED ORDER — POLYETHYLENE GLYCOL 3350 17 G PO PACK
17.0000 g | PACK | Freq: Every day | ORAL | Status: DC | PRN
  Administered 2022-10-25: 17 g via ORAL
  Filled 2022-10-24: qty 1

## 2022-10-24 MED ORDER — OXYCODONE HCL 5 MG PO TABS: 5.0000 mg | ORAL_TABLET | ORAL | Status: DC | PRN

## 2022-10-24 MED ORDER — HYDRALAZINE HCL 20 MG/ML IJ SOLN: 5.0000 mg | INTRAMUSCULAR | Status: DC | PRN

## 2022-10-24 MED ORDER — PERFLUTREN LIPID MICROSPHERE
1.0000 mL | INTRAVENOUS | Status: AC | PRN
  Administered 2022-10-24: 2 mL via INTRAVENOUS

## 2022-10-24 MED ORDER — ACETAMINOPHEN 650 MG RE SUPP: 650.0000 mg | Freq: Four times a day (QID) | RECTAL | Status: DC | PRN

## 2022-10-24 MED ORDER — MORPHINE SULFATE (PF) 2 MG/ML IV SOLN: 2.0000 mg | INTRAVENOUS | Status: DC | PRN

## 2022-10-24 MED ORDER — TRAZODONE HCL 50 MG PO TABS: 25.0000 mg | ORAL_TABLET | Freq: Every evening | ORAL | Status: DC | PRN

## 2022-10-24 MED ORDER — SPIRONOLACTONE 25 MG PO TABS
25.0000 mg | ORAL_TABLET | Freq: Every day | ORAL | Status: DC
  Administered 2022-10-24 – 2022-10-26 (×3): 25 mg via ORAL
  Filled 2022-10-24 (×3): qty 1

## 2022-10-24 MED ORDER — METOPROLOL SUCCINATE ER 50 MG PO TB24
50.0000 mg | ORAL_TABLET | Freq: Every day | ORAL | Status: DC
  Administered 2022-10-24 – 2022-10-26 (×3): 50 mg via ORAL
  Filled 2022-10-24 (×2): qty 1
  Filled 2022-10-24: qty 2

## 2022-10-24 MED ORDER — ONDANSETRON HCL 4 MG PO TABS: 4.0000 mg | ORAL_TABLET | Freq: Four times a day (QID) | ORAL | Status: DC | PRN

## 2022-10-24 MED ORDER — ATORVASTATIN CALCIUM 10 MG PO TABS
20.0000 mg | ORAL_TABLET | Freq: Every day | ORAL | Status: DC
  Administered 2022-10-24 – 2022-10-26 (×3): 20 mg via ORAL
  Filled 2022-10-24 (×3): qty 2

## 2022-10-24 MED ORDER — FUROSEMIDE 10 MG/ML IJ SOLN
40.0000 mg | Freq: Two times a day (BID) | INTRAMUSCULAR | Status: DC
  Administered 2022-10-25 (×2): 40 mg via INTRAVENOUS
  Filled 2022-10-24 (×2): qty 4

## 2022-10-24 MED ORDER — ENOXAPARIN SODIUM 40 MG/0.4ML IJ SOSY
40.0000 mg | PREFILLED_SYRINGE | INTRAMUSCULAR | Status: DC
  Administered 2022-10-24 – 2022-10-25 (×2): 40 mg via SUBCUTANEOUS
  Filled 2022-10-24 (×2): qty 0.4

## 2022-10-24 MED ORDER — SACUBITRIL-VALSARTAN 24-26 MG PO TABS
1.0000 | ORAL_TABLET | Freq: Two times a day (BID) | ORAL | Status: DC
  Administered 2022-10-24 – 2022-10-26 (×5): 1 via ORAL
  Filled 2022-10-24 (×5): qty 1

## 2022-10-24 MED ORDER — ASPIRIN 81 MG PO TBEC
81.0000 mg | DELAYED_RELEASE_TABLET | Freq: Every day | ORAL | Status: DC
  Administered 2022-10-24 – 2022-10-26 (×3): 81 mg via ORAL
  Filled 2022-10-24 (×3): qty 1

## 2022-10-24 MED ORDER — ACETAMINOPHEN 325 MG PO TABS: 650.0000 mg | ORAL_TABLET | Freq: Four times a day (QID) | ORAL | Status: DC | PRN

## 2022-10-24 MED ORDER — SODIUM CHLORIDE 0.9% FLUSH
3.0000 mL | Freq: Two times a day (BID) | INTRAVENOUS | Status: DC
  Administered 2022-10-24 – 2022-10-26 (×5): 3 mL via INTRAVENOUS

## 2022-10-24 NOTE — ED Notes (Signed)
Echo at bedside

## 2022-10-24 NOTE — Progress Notes (Signed)
  Echocardiogram 2D Echocardiogram has been performed.  Pedro Kemp 10/24/2022, 3:27 PM

## 2022-10-24 NOTE — ED Notes (Signed)
ED TO INPATIENT HANDOFF REPORT  ED Nurse Name and Phone #: [phone]  S Name/Age/Gender Pedro Kemp 67 y.o. male Room/Bed: 014C/014C  Code Status   Code Status: Full Code  Home/SNF/Other Home Patient oriented to: self, place, time, and situation Is this baseline? Yes   Triage Complete: Triage complete  Chief Complaint CHF exacerbation [facility]) [I50.9]  Triage Note Patient reports SOB this evening with chest tightness/wheezing . History of CAD/Pacemaker .    Allergies No Known Allergies  Level of Care/Admitting Diagnosis ED Disposition     ED Disposition  Admit   Condition  --   Comment  Hospital [facility]: [facility] [[id number]]  Level of Care: Telemetry Cardiac [[id number]]  May admit patient to [facility] or [facility] if equivalent level of care is available:: Yes  Covid Evaluation: Asymptomatic - no recent exposure (last 10 days) testing not required  Diagnosis: CHF exacerbation [facility]) [[id number]]  Admitting Physician: Magnus Ivan [[id number]]  Attending Physician: Magnus Ivan [[id number]]  Certification:: I certify this patient will need inpatient services for at least 2 midnights  Estimated Length of Stay: 2          B Medical/Surgery History Past Medical History:  Diagnosis Date   Cardiac arrest (HCC)    CHF (congestive heart failure) (HCC)    Coronary artery disease    Diabetes mellitus without complication (HCC)    H/O heart artery stent    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   Past Surgical History:  Procedure Laterality Date   CARDIAC CATHETERIZATION     ICD IMPLANT       A IV Location/Drains/Wounds Patient Lines/Drains/Airways Status     Active Line/Drains/Airways     Name Placement date Placement time Site Days   Peripheral IV 10/23/22 20 G Left;Posterior Forearm 10/23/22  2345  Forearm  1            Intake/Output Last 24 hours  Intake/Output Summary (Last 24 hours) at 10/24/2022  1918 Last data filed at 10/24/2022 5670 Gross per 24 hour  Intake --  Output 1700 ml  Net -1700 ml    Labs/Imaging Results for orders placed or performed during the hospital encounter of 10/23/22 (from the past 48 hour(s))  Resp panel by RT-PCR (RSV, Flu A&B, Covid) Anterior Nasal Swab     Status: None   Collection Time: 10/23/22 10:05 PM   Specimen: Anterior Nasal Swab  Result Value Ref Range   SARS Coronavirus 2 by RT PCR NEGATIVE NEGATIVE    Comment: (NOTE) SARS-CoV-2 target nucleic acids are NOT DETECTED.  The SARS-CoV-2 RNA is generally detectable in upper respiratory specimens during the acute phase of infection. The lowest concentration of SARS-CoV-2 viral copies this assay can detect is 138 copies/mL. A negative result does not preclude SARS-Cov-2 infection and should not be used as the sole basis for treatment or other patient management decisions. A negative result may occur with  improper specimen collection/handling, submission of specimen other than nasopharyngeal swab, presence of viral mutation(s) within the areas targeted by this assay, and inadequate number of viral copies(<138 copies/mL). A negative result must be combined with clinical observations, patient history, and epidemiological information. The expected result is Negative.  Fact Sheet for Patients:  BloggerCourse.com  Fact Sheet for Healthcare Providers:  SeriousBroker.it  This test is no t yet approved or cleared by the [place] FDA and  has been authorized for detection and/or diagnosis of SARS-CoV-2 by  FDA under an Emergency Use Authorization (EUA). This EUA will remain  in effect (meaning this test can be used) for the duration of the COVID-19 declaration under Section 564(b)(1) of the Act, 21 U.S.C.section 360bbb-3(b)(1), unless the authorization is terminated  or revoked sooner.       Influenza A by PCR NEGATIVE NEGATIVE   Influenza B  by PCR NEGATIVE NEGATIVE    Comment: (NOTE) The Xpert Xpress SARS-CoV-2/FLU/RSV plus assay is intended as an aid in the diagnosis of influenza from Nasopharyngeal swab specimens and should not be used as a sole basis for treatment. Nasal washings and aspirates are unacceptable for Xpert Xpress SARS-CoV-2/FLU/RSV testing.  Fact Sheet for Patients: EntrepreneurPulse.com.au  Fact Sheet for Healthcare Providers: IncredibleEmployment.be  This test is not yet approved or cleared by the [place] FDA and has been authorized for detection and/or diagnosis of SARS-CoV-2 by FDA under an Emergency Use Authorization (EUA). This EUA will remain in effect (meaning this test can be used) for the duration of the COVID-19 declaration under Section 564(b)(1) of the Act, 21 U.S.C. section 360bbb-3(b)(1), unless the authorization is terminated or revoked.     Resp Syncytial Virus by PCR NEGATIVE NEGATIVE    Comment: (NOTE) Fact Sheet for Patients: EntrepreneurPulse.com.au  Fact Sheet for Healthcare Providers: IncredibleEmployment.be  This test is not yet approved or cleared by the [place] FDA and has been authorized for detection and/or diagnosis of SARS-CoV-2 by FDA under an Emergency Use Authorization (EUA). This EUA will remain in effect (meaning this test can be used) for the duration of the COVID-19 declaration under Section 564(b)(1) of the Act, 21 U.S.C. section 360bbb-3(b)(1), unless the authorization is terminated or revoked.  Performed at [facility], [place] [address] [place], [place] [address]   Comprehensive metabolic panel     Status: Abnormal   Collection Time: 10/23/22 10:19 PM  Result Value Ref Range   Sodium 136 135 - 145 mmol/L   Potassium 3.6 3.5 - 5.1 mmol/L   Chloride 105 98 - 111 mmol/L   CO2 20 (L) 22 - 32 mmol/L   Glucose, Bld 193 (H) 70 - 99 mg/dL    Comment: Glucose reference  range applies only to samples taken after fasting for at least 8 hours.   BUN 26 (H) 8 - 23 mg/dL   Creatinine, Ser 1.25 (H) 0.61 - 1.24 mg/dL   Calcium 8.6 (L) 8.9 - 10.3 mg/dL   Total Protein 7.0 6.5 - 8.1 g/dL   Albumin 3.8 3.5 - 5.0 g/dL   AST 26 15 - 41 U/L   ALT 26 0 - 44 U/L   Alkaline Phosphatase 79 38 - 126 U/L   Total Bilirubin 0.6 0.3 - 1.2 mg/dL   GFR, Estimated >60 >60 mL/min    Comment: (NOTE) Calculated using the CKD-EPI Creatinine Equation (2021)    Anion gap 11 5 - 15    Comment: Performed at [facility] [address], [place], [place] [address]  Brain natriuretic peptide     Status: Abnormal   Collection Time: 10/23/22 10:19 PM  Result Value Ref Range   B Natriuretic Peptide 729.5 (H) 0.0 - 100.0 pg/mL    Comment: Performed at [facility] [address] [place], [place] [address]  Troponin I (High Sensitivity)     Status: Abnormal   Collection Time: 10/23/22 10:19 PM  Result Value Ref Range   Troponin I (High Sensitivity) 152 (HH) <18 ng/L  Comment: CRITICAL RESULT CALLED TO, READ BACK BY AND VERIFIED WITH K. NOVIELLO, RN, [id number], 10/23/22, EADEDOKUN (NOTE) Elevated high sensitivity troponin I (hsTnI) values and significant  changes across serial measurements may suggest ACS but many other  chronic and acute conditions are known to elevate hsTnI results.  Refer to the "Links" section for chest pain algorithms and additional  guidance. Performed at [facility], [place] [address], [place], [place] [address]   CBC with Differential     Status: Abnormal   Collection Time: 10/23/22 10:19 PM  Result Value Ref Range   WBC 10.5 4.0 - 10.5 K/uL   RBC 4.87 4.22 - 5.81 MIL/uL   Hemoglobin 14.5 13.0 - 17.0 g/dL   HCT 44.2 39.0 - 52.0 %   MCV 90.8 80.0 - 100.0 fL   MCH 29.8 26.0 - 34.0 pg   MCHC 32.8 30.0 - 36.0 g/dL   RDW 12.9 11.5 - 15.5 %   Platelets 176 150 - 400 K/uL   nRBC 0.0 0.0 - 0.2 %   Neutrophils Relative % 44 %   Neutro Abs 4.6  1.7 - 7.7 K/uL   Lymphocytes Relative 32 %   Lymphs Abs 3.3 0.7 - 4.0 K/uL   Monocytes Relative 4 %   Monocytes Absolute 0.5 0.1 - 1.0 K/uL   Eosinophils Relative 19 %   Eosinophils Absolute 2.0 (H) 0.0 - 0.5 K/uL   Basophils Relative 1 %   Basophils Absolute 0.1 0.0 - 0.1 K/uL   Immature Granulocytes 0 %   Abs Immature Granulocytes 0.03 0.00 - 0.07 K/uL    Comment: Performed at [facility] [address] [place], [place] [address]  Troponin I (High Sensitivity)     Status: Abnormal   Collection Time: 10/23/22 11:52 PM  Result Value Ref Range   Troponin I (High Sensitivity) 163 (HH) <18 ng/L    Comment: CRITICAL VALUE NOTED. VALUE IS CONSISTENT WITH PREVIOUSLY REPORTED/CALLED VALUE (NOTE) Elevated high sensitivity troponin I (hsTnI) values and significant  changes across serial measurements may suggest ACS but many other  chronic and acute conditions are known to elevate hsTnI results.  Refer to the "Links" section for chest pain algorithms and additional  guidance. Performed at [facility], [place] [address] [place], [place] [address]   I-Stat venous blood gas, ED     Status: Abnormal   Collection Time: 10/24/22 12:22 AM  Result Value Ref Range   pH, Ven 7.235 (L) 7.25 - 7.43   pCO2, Ven 48.2 44 - 60 mmHg   pO2, Ven 45 32 - 45 mmHg   Bicarbonate 20.4 20.0 - 28.0 mmol/L   TCO2 22 22 - 32 mmol/L   O2 Saturation 72 %   Acid-base deficit 7.0 (H) 0.0 - 2.0 mmol/L   Sodium 140 135 - 145 mmol/L   Potassium 3.8 3.5 - 5.1 mmol/L   Calcium, Ion 1.19 1.15 - 1.40 mmol/L   HCT 43.0 39.0 - 52.0 %   Hemoglobin 14.6 13.0 - 17.0 g/dL   Sample type VENOUS   CBG monitoring, ED     Status: Abnormal   Collection Time: 10/24/22 12:00 PM  Result Value Ref Range   Glucose-Capillary 255 (H) 70 - 99 mg/dL    Comment: Glucose reference range applies only to samples taken after fasting for at least 8 hours.  CBG monitoring, ED     Status: None   Collection Time: 10/24/22  4:02 PM   Result Value Ref  Range   Glucose-Capillary 74 70 - 99 mg/dL    Comment: Glucose reference range applies only to samples taken after fasting for at least 8 hours.  CBG monitoring, ED     Status: Abnormal   Collection Time: 10/24/22  4:58 PM  Result Value Ref Range   Glucose-Capillary 126 (H) 70 - 99 mg/dL    Comment: Glucose reference range applies only to samples taken after fasting for at least 8 hours.   DG Chest 2 View  Result Date: 10/23/2022 CLINICAL DATA:  Shortness of breath and chest tightness. EXAM: CHEST - 2 VIEW COMPARISON:  June 23, 2022 FINDINGS: There is stable dual lead AICD positioning. The heart size and mediastinal contours are within normal limits. A coronary artery stent is in place. There is marked severity calcification of the aortic arch. Mild diffusely increased interstitial lung markings are seen. Mild linear atelectasis is noted within the mid right lung. There is no evidence of a pleural effusion or pneumothorax. The visualized skeletal structures are unremarkable. IMPRESSION: Mild interstitial edema with mild linear atelectasis within the mid right lung. Electronically Signed   By: Virgina Norfolk M.D.   On: 10/23/2022 22:34    Pending Labs Unresulted Labs (From admission, onward)     Start     Ordered   10/25/22 9179  Basic metabolic panel  Tomorrow morning,   R        10/24/22 0945   10/25/22 0500  CBC  Tomorrow morning,   R        10/24/22 0945            Vitals/Pain Today's Vitals   10/24/22 1732 10/24/22 1735 10/24/22 1815 10/24/22 1835  BP:   127/71   Pulse: 61 66 69   Resp: (!) 24 (!) 22 14   Temp:      TempSrc:      SpO2: 97% 97% 99%   PainSc:    0-No pain    Isolation Precautions No active isolations  Medications Medications  aspirin EC tablet 81 mg (81 mg Oral Given 10/24/22 1046)  atorvastatin (LIPITOR) tablet 20 mg (20 mg Oral Given 10/24/22 1046)  metoprolol succinate (TOPROL-XL) 24 hr tablet 50 mg (50 mg Oral Given 10/24/22  1046)  sacubitril-valsartan (ENTRESTO) 24-26 mg per tablet (1 tablet Oral Given 10/24/22 1046)  spironolactone (ALDACTONE) tablet 25 mg (25 mg Oral Given 10/24/22 1046)  furosemide (LASIX) injection 40 mg (has no administration in time range)  insulin aspart (novoLOG) injection 0-15 Units ( Subcutaneous Not Given 10/24/22 1649)  insulin aspart (novoLOG) injection 0-5 Units (has no administration in time range)  sodium chloride flush (NS) 0.9 % injection 3 mL (3 mLs Intravenous Given 10/24/22 1043)  acetaminophen (TYLENOL) tablet 650 mg (has no administration in time range)    Or  acetaminophen (TYLENOL) suppository 650 mg (has no administration in time range)  oxyCODONE (Oxy IR/ROXICODONE) immediate release tablet 5 mg (has no administration in time range)  morphine (PF) 2 MG/ML injection 2 mg (has no administration in time range)  traZODone (DESYREL) tablet 25 mg (has no administration in time range)  docusate sodium (COLACE) capsule 100 mg (100 mg Oral Given 10/24/22 1046)  polyethylene glycol (MIRALAX / GLYCOLAX) packet 17 g (has no administration in time range)  bisacodyl (DULCOLAX) EC tablet 5 mg (has no administration in time range)  ondansetron (ZOFRAN) tablet 4 mg (has no administration in time range)    Or  ondansetron (ZOFRAN) injection 4 mg (has no  administration in time range)  hydrALAZINE (APRESOLINE) injection 5 mg (has no administration in time range)  enoxaparin (LOVENOX) injection 40 mg (40 mg Subcutaneous Given 10/24/22 1221)  perflutren lipid microspheres (DEFINITY) IV suspension (2 mLs Intravenous Given 10/24/22 1507)  furosemide (LASIX) injection 40 mg (40 mg Intravenous Given 10/23/22 2348)  metoprolol tartrate (LOPRESSOR) tablet 50 mg (50 mg Oral Given 10/23/22 2348)    Mobility walks Low fall risk   Focused Assessments Pulmonary Assessment Handoff:  Lung sounds: Bilateral Breath Sounds: Clear O2 Device: Room Air O2 Flow Rate (L/min): 2 L/min    R Recommendations: See  Admitting Provider Note  Report given to:   Additional Notes:

## 2022-10-24 NOTE — ED Notes (Signed)
ED TO INPATIENT HANDOFF REPORT  ED Nurse Name and Phone #: Timmothy Sours Name/Age/Gender Pedro Kemp 67 y.o. male Room/Bed: 014C/014C  Code Status   Code Status: Full Code  Home/SNF/Other Home Patient oriented to: self, place, time, and situation Is this baseline? Yes   Triage Complete: Triage complete  Chief Complaint CHF exacerbation [facility]) [I50.9]  Triage Note Patient reports SOB this evening with chest tightness/wheezing . History of CAD/Pacemaker .    Allergies No Known Allergies  Level of Care/Admitting Diagnosis ED Disposition     ED Disposition  Admit   Condition  --   Comment  Hospital Area: [place] [[id number]]  Level of Care: Telemetry Cardiac [[id number]]  May admit patient to Pedro Kemp or Pedro Kemp if equivalent level of care is available:: Yes  Covid Evaluation: Asymptomatic - no recent exposure (last 10 days) testing not required  Diagnosis: CHF exacerbation [name]) [[id number]]  Admitting Physician: Dory Horn [[id number]]  Attending Physician: Dory Horn [[id number]]  Certification:: I certify this patient will need inpatient services for at least 2 midnights  Estimated Length of Stay: 2          B Medical/Surgery History Past Medical History:  Diagnosis Date   Cardiac arrest ([employer])    CHF (congestive heart failure) ([place])    Coronary artery disease    Diabetes mellitus without complication ([place])    H/O heart artery stent    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   Past Surgical History:  Procedure Laterality Date   CARDIAC CATHETERIZATION     ICD IMPLANT       A IV Location/Drains/Wounds Patient Lines/Drains/Airways Status     Active Line/Drains/Airways     Name Placement date Placement time Site Days   Peripheral IV 10/23/22 20 G Left;Posterior Forearm 10/23/22  2345  Forearm  1            Intake/Output Last 24 hours  Intake/Output Summary (Last 24 hours)  at 10/24/2022 1650 Last data filed at 10/24/2022 9528 Gross per 24 hour  Intake --  Output 1700 ml  Net -1700 ml    Labs/Imaging Results for orders placed or performed during the hospital encounter of 10/23/22 (from the past 48 hour(s))  Resp panel by RT-PCR (RSV, Flu A&B, Covid) Anterior Nasal Swab     Status: None   Collection Time: 10/23/22 10:05 PM   Specimen: Anterior Nasal Swab  Result Value Ref Range   SARS Coronavirus 2 by RT PCR NEGATIVE NEGATIVE    Comment: (NOTE) SARS-CoV-2 target nucleic acids are NOT DETECTED.  The SARS-CoV-2 RNA is generally detectable in upper respiratory specimens during the acute phase of infection. The lowest concentration of SARS-CoV-2 viral copies this assay can detect is 138 copies/mL. A negative result does not preclude SARS-Cov-2 infection and should not be used as the sole basis for treatment or other patient management decisions. A negative result may occur with  improper specimen collection/handling, submission of specimen other than nasopharyngeal swab, presence of viral mutation(s) within the areas targeted by this assay, and inadequate number of viral copies(<138 copies/mL). A negative result must be combined with clinical observations, patient history, and epidemiological information. The expected result is Negative.  Fact Sheet for Patients:  EntrepreneurPulse.com.au  Fact Sheet for Healthcare Providers:  IncredibleEmployment.be  This test is no t yet approved or cleared by the [place] FDA and  has been authorized for detection and/or diagnosis of SARS-CoV-2  by FDA under an Emergency Use Authorization (EUA). This EUA will remain  in effect (meaning this test can be used) for the duration of the COVID-19 declaration under Section 564(b)(1) of the Act, 21 U.S.C.section 360bbb-3(b)(1), unless the authorization is terminated  or revoked sooner.       Influenza A by PCR NEGATIVE NEGATIVE    Influenza B by PCR NEGATIVE NEGATIVE    Comment: (NOTE) The Xpert Xpress SARS-CoV-2/FLU/RSV plus assay is intended as an aid in the diagnosis of influenza from Nasopharyngeal swab specimens and should not be used as a sole basis for treatment. Nasal washings and aspirates are unacceptable for Xpert Xpress SARS-CoV-2/FLU/RSV testing.  Fact Sheet for Patients: BloggerCourse.com  Fact Sheet for Healthcare Providers: SeriousBroker.it  This test is not yet approved or cleared by the [place] FDA and has been authorized for detection and/or diagnosis of SARS-CoV-2 by FDA under an Emergency Use Authorization (EUA). This EUA will remain in effect (meaning this test can be used) for the duration of the COVID-19 declaration under Section 564(b)(1) of the Act, 21 U.S.C. section 360bbb-3(b)(1), unless the authorization is terminated or revoked.     Resp Syncytial Virus by PCR NEGATIVE NEGATIVE    Comment: (NOTE) Fact Sheet for Patients: BloggerCourse.com  Fact Sheet for Healthcare Providers: SeriousBroker.it  This test is not yet approved or cleared by the [place] FDA and has been authorized for detection and/or diagnosis of SARS-CoV-2 by FDA under an Emergency Use Authorization (EUA). This EUA will remain in effect (meaning this test can be used) for the duration of the COVID-19 declaration under Section 564(b)(1) of the Act, 21 U.S.C. section 360bbb-3(b)(1), unless the authorization is terminated or revoked.  Performed at [facility], [address], [place], [place] [address]   Comprehensive metabolic panel     Status: Abnormal   Collection Time: 10/23/22 10:19 PM  Result Value Ref Range   Sodium 136 135 - 145 mmol/L   Potassium 3.6 3.5 - 5.1 mmol/L   Chloride 105 98 - 111 mmol/L   CO2 20 (L) 22 - 32 mmol/L   Glucose, Bld 193 (H) 70 - 99 mg/dL    Comment:  Glucose reference range applies only to samples taken after fasting for at least 8 hours.   BUN 26 (H) 8 - 23 mg/dL   Creatinine, Ser 2.62 (H) 0.61 - 1.24 mg/dL   Calcium 8.6 (L) 8.9 - 10.3 mg/dL   Total Protein 7.0 6.5 - 8.1 g/dL   Albumin 3.8 3.5 - 5.0 g/dL   AST 26 15 - 41 U/L   ALT 26 0 - 44 U/L   Alkaline Phosphatase 79 38 - 126 U/L   Total Bilirubin 0.6 0.3 - 1.2 mg/dL   GFR, Estimated >03 >55 mL/min    Comment: (NOTE) Calculated using the CKD-EPI Creatinine Equation (2021)    Anion gap 11 5 - 15    Comment: Performed at [facility], [address], [place], [place] [address]  Brain natriuretic peptide     Status: Abnormal   Collection Time: 10/23/22 10:19 PM  Result Value Ref Range   B Natriuretic Peptide 729.5 (H) 0.0 - 100.0 pg/mL    Comment: Performed at [facility], [address] [place], [place] [address]  Troponin I (High Sensitivity)     Status: Abnormal   Collection Time: 10/23/22 10:19 PM  Result Value Ref Range   Troponin I (High Sensitivity) 152 (HH) <18 ng/L  Comment: CRITICAL RESULT CALLED TO, READ BACK BY AND VERIFIED WITH K. NOVIELLO, RN, 2340, 10/23/22, EADEDOKUN (NOTE) Elevated high sensitivity troponin I (hsTnI) values and significant  changes across serial measurements may suggest ACS but many other  chronic and acute conditions are known to elevate hsTnI results.  Refer to the "Links" section for chest pain algorithms and additional  guidance. Performed at [facility], [address] [place], [place] [address]   CBC with Differential     Status: Abnormal   Collection Time: 10/23/22 10:19 PM  Result Value Ref Range   WBC 10.5 4.0 - 10.5 K/uL   RBC 4.87 4.22 - 5.81 MIL/uL   Hemoglobin 14.5 13.0 - 17.0 g/dL   HCT 10.2 72.5 - 36.6 %   MCV 90.8 80.0 - 100.0 fL   MCH 29.8 26.0 - 34.0 pg   MCHC 32.8 30.0 - 36.0 g/dL   RDW 44.0 34.7 - 42.5 %   Platelets 176 150 - 400 K/uL   nRBC 0.0 0.0 - 0.2 %   Neutrophils Relative % 44 %    Neutro Abs 4.6 1.7 - 7.7 K/uL   Lymphocytes Relative 32 %   Lymphs Abs 3.3 0.7 - 4.0 K/uL   Monocytes Relative 4 %   Monocytes Absolute 0.5 0.1 - 1.0 K/uL   Eosinophils Relative 19 %   Eosinophils Absolute 2.0 (H) 0.0 - 0.5 K/uL   Basophils Relative 1 %   Basophils Absolute 0.1 0.0 - 0.1 K/uL   Immature Granulocytes 0 %   Abs Immature Granulocytes 0.03 0.00 - 0.07 K/uL    Comment: Performed at [facility], [address] [place], [place] [address]  Troponin I (High Sensitivity)     Status: Abnormal   Collection Time: 10/23/22 11:52 PM  Result Value Ref Range   Troponin I (High Sensitivity) 163 (HH) <18 ng/L    Comment: CRITICAL VALUE NOTED. VALUE IS CONSISTENT WITH PREVIOUSLY REPORTED/CALLED VALUE (NOTE) Elevated high sensitivity troponin I (hsTnI) values and significant  changes across serial measurements may suggest ACS but many other  chronic and acute conditions are known to elevate hsTnI results.  Refer to the "Links" section for chest pain algorithms and additional  guidance. Performed at [facility], [address], [place], [place] [address]   I-Stat venous blood gas, ED     Status: Abnormal   Collection Time: 10/24/22 12:22 AM  Result Value Ref Range   pH, Ven 7.235 (L) 7.25 - 7.43   pCO2, Ven 48.2 44 - 60 mmHg   pO2, Ven 45 32 - 45 mmHg   Bicarbonate 20.4 20.0 - 28.0 mmol/L   TCO2 22 22 - 32 mmol/L   O2 Saturation 72 %   Acid-base deficit 7.0 (H) 0.0 - 2.0 mmol/L   Sodium 140 135 - 145 mmol/L   Potassium 3.8 3.5 - 5.1 mmol/L   Calcium, Ion 1.19 1.15 - 1.40 mmol/L   HCT 43.0 39.0 - 52.0 %   Hemoglobin 14.6 13.0 - 17.0 g/dL   Sample type VENOUS   CBG monitoring, ED     Status: Abnormal   Collection Time: 10/24/22 12:00 PM  Result Value Ref Range   Glucose-Capillary 255 (H) 70 - 99 mg/dL    Comment: Glucose reference range applies only to samples taken after fasting for at least 8 hours.  CBG monitoring, ED     Status: None   Collection Time:  10/24/22  4:02 PM  Result Value Ref  Range   Glucose-Capillary 74 70 - 99 mg/dL    Comment: Glucose reference range applies only to samples taken after fasting for at least 8 hours.   DG Chest 2 View  Result Date: 10/23/2022 CLINICAL DATA:  Shortness of breath and chest tightness. EXAM: CHEST - 2 VIEW COMPARISON:  June 23, 2022 FINDINGS: There is stable dual lead AICD positioning. The heart size and mediastinal contours are within normal limits. A coronary artery stent is in place. There is marked severity calcification of the aortic arch. Mild diffusely increased interstitial lung markings are seen. Mild linear atelectasis is noted within the mid right lung. There is no evidence of a pleural effusion or pneumothorax. The visualized skeletal structures are unremarkable. IMPRESSION: Mild interstitial edema with mild linear atelectasis within the mid right lung. Electronically Signed   By: Aram Candela M.D.   On: 10/23/2022 22:34    Pending Labs Unresulted Labs (From admission, onward)     Start     Ordered   10/25/22 0500  Basic metabolic panel  Tomorrow morning,   R        10/24/22 0945   10/25/22 0500  CBC  Tomorrow morning,   R        10/24/22 0945            Vitals/Pain Today's Vitals   10/24/22 1534 10/24/22 1545 10/24/22 1552 10/24/22 1600  BP:    102/62  Pulse:  74  62  Resp:      Temp:   97.8 F (36.6 C)   TempSrc:   Oral   SpO2:  95%  95%  PainSc: 0-No pain       Isolation Precautions No active isolations  Medications Medications  aspirin EC tablet 81 mg (81 mg Oral Given 10/24/22 1046)  atorvastatin (LIPITOR) tablet 20 mg (20 mg Oral Given 10/24/22 1046)  metoprolol succinate (TOPROL-XL) 24 hr tablet 50 mg (50 mg Oral Given 10/24/22 1046)  sacubitril-valsartan (ENTRESTO) 24-26 mg per tablet (1 tablet Oral Given 10/24/22 1046)  spironolactone (ALDACTONE) tablet 25 mg (25 mg Oral Given 10/24/22 1046)  furosemide (LASIX) injection 40 mg (has no administration in time  range)  insulin aspart (novoLOG) injection 0-15 Units ( Subcutaneous Not Given 10/24/22 1649)  insulin aspart (novoLOG) injection 0-5 Units (has no administration in time range)  sodium chloride flush (NS) 0.9 % injection 3 mL (3 mLs Intravenous Given 10/24/22 1043)  acetaminophen (TYLENOL) tablet 650 mg (has no administration in time range)    Or  acetaminophen (TYLENOL) suppository 650 mg (has no administration in time range)  oxyCODONE (Oxy IR/ROXICODONE) immediate release tablet 5 mg (has no administration in time range)  morphine (PF) 2 MG/ML injection 2 mg (has no administration in time range)  traZODone (DESYREL) tablet 25 mg (has no administration in time range)  docusate sodium (COLACE) capsule 100 mg (100 mg Oral Given 10/24/22 1046)  polyethylene glycol (MIRALAX / GLYCOLAX) packet 17 g (has no administration in time range)  bisacodyl (DULCOLAX) EC tablet 5 mg (has no administration in time range)  ondansetron (ZOFRAN) tablet 4 mg (has no administration in time range)    Or  ondansetron (ZOFRAN) injection 4 mg (has no administration in time range)  hydrALAZINE (APRESOLINE) injection 5 mg (has no administration in time range)  enoxaparin (LOVENOX) injection 40 mg (40 mg Subcutaneous Given 10/24/22 1221)  perflutren lipid microspheres (DEFINITY) IV suspension (2 mLs Intravenous Given 10/24/22 1507)  furosemide (LASIX) injection 40 mg (40 mg Intravenous  Given 10/23/22 2348)  metoprolol tartrate (LOPRESSOR) tablet 50 mg (50 mg Oral Given 10/23/22 2348)    Mobility walks Low fall risk   Focused Assessments    R Recommendations: See Admitting Provider Note  Report given to:   Additional Notes:

## 2022-10-24 NOTE — TOC Initial Note (Signed)
Transition of Care [facility]) - Initial/Assessment Note    Patient Details  Name: Pedro Kemp MRN: [id number] Date of Birth: Mar 24, 1956  Transition of [facility]) CM/SW Contact:    Verdell Carmine, RN Phone Number: 10/24/2022, 2:36 PM  Clinical Narrative:                  Patient admitted for respiratory failure increased troponin. SHOB. History of CHF, CAD, HPTN, DM [facility]. Has AICD.  Patient has no known SSN.  Has appointment already scheduled with [facility] on 1/[facility] at [facility] following from [facility]- patient has orange card and CAFA< but pending renewal, Sister was supposed to fill out paperwork and turn it in to office.   Will follow for needs   Barriers to Discharge: Inadequate or no insurance   Patient Goals and CMS Choice            Expected Discharge Plan and Services       Living arrangements for the past 2 months: Single Family Home                                      Prior Living Arrangements/Services Living arrangements for the past 2 months: Single Family Home Lives with:: Relatives Patient language and need for interpreter reviewed:: Yes (Spanish Speaking)        Need for Family Participation in Patient Care: Yes (Comment)     Criminal Activity/Legal Involvement Pertinent to Current Situation/Hospitalization: No - Comment as needed  Activities of Daily Living      Permission Sought/Granted                  Emotional Assessment           Psych Involvement: No (comment)  Admission diagnosis:  CHF exacerbation ([place]) [I50.9] Patient Active Problem List   Diagnosis Date Noted   CHF exacerbation ([place]) 10/24/2022   Acute on chronic HFrEF (heart failure with reduced ejection fraction) ([place]) 01/29/2022   Coronary artery disease 01/29/2022   History of cardiac arrest 01/29/2022   Leukocytosis 01/29/2022   Type 2 diabetes mellitus without complication, without long-term current use of  insulin ([place]) 07/22/2021   Congestive heart failure ([place]) 07/22/2021   Hyperlipidemia associated with type 2 diabetes mellitus ([place]) 07/22/2021   Essential hypertension 07/22/2021   PCP:  [name], NP Pharmacy:   [facility] [address], [place] [address] Phone: [phone] Fax: [phone]     Social Determinants of Health (SDOH) Social History: SDOH Screenings   Food Insecurity: No Food Insecurity (06/12/2022)  Housing: Low Risk  (06/12/2022)  Transportation Needs: No Transportation Needs (06/12/2022)  Depression (PHQ2-9): Low Risk  (02/09/2022)  Financial Resource Strain: High Risk (06/12/2022)  Tobacco Use: Low Risk  (10/24/2022)   SDOH Interventions:     Readmission Risk Interventions     No data to display

## 2022-10-24 NOTE — H&P (Signed)
History and Physical    Patient: Pedro Kemp SWF:[id number] DOB: 09/28/1956 DOA: 10/23/2022 DOS: the patient was seen and examined on 10/24/2022 PCP: Grayce Sessions, NP  Patient coming from: Home - lives with nephews; NOK: Ilene Qua, [phone]   Chief Complaint: CP/SOB  HPI: Pedro Kemp is a 67 y.o. male with medical history significant of CAD with h/o cardiac arrest; chronic systolic CHF (EF 23-76%); and DM presenting with CP/SOB.   History obtained via [place] interpreter.  He started having difficulty breathing last night while at rest.  He felt well during the day yesterday.  No orthopnea.  He had some epigastric/substernal discomfort.  No LE edema.  He ran out of medications about a month ago.   He has ASA and 1 other medication currently.  He is feeling better now.  No further chest discomfort.  He is not on home O2.     ER Course:  Carryover, per Dr. Loney Loh:  67 year old Spanish-speaking male with history of chronic systolic CHF (EF 30 to 35%), CAD, diabetes presenting with shortness of breath and chest tightness.  He has not taken Lasix for a month and unclear whether he is taking metoprolol.  Troponin elevated but stable (152> 160), BNP 729.  Chest x-ray showing pulmonary edema.  He was given Lasix 40 mg and oral metoprolol 50 mg.  Oxygen saturation was in the low 80s and requiring 3 L.      Review of Systems: As mentioned in the history of present illness. All other systems reviewed and are negative. Past Medical History:  Diagnosis Date   Cardiac arrest [facility])    CHF (congestive heart failure) (HCC)    Coronary artery disease    Diabetes mellitus without complication (HCC)    H/O heart artery stent    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   Past Surgical History:  Procedure Laterality Date   CARDIAC CATHETERIZATION     ICD IMPLANT     Social History:  reports that he has never smoked. He has never been  exposed to tobacco smoke. He has never used smokeless tobacco. He reports that he does not drink alcohol and does not use drugs.  No Known Allergies  Family History  Problem Relation Age of Onset   Diabetes Mother    Diabetes Brother     Prior to Admission medications   Medication Sig Start Date End Date Taking? Authorizing Provider  aspirin EC 81 MG tablet Take 81 mg by mouth daily. Swallow whole.   Yes [provider]  atorvastatin (LIPITOR) 20 MG tablet Take 1 tablet (20 mg total) by mouth daily. 01/07/22  Yes Maisie Fus, MD  spironolactone (ALDACTONE) 25 MG tablet Take 1 tablet (25 mg total) by mouth daily. 09/07/22  Yes Maisie Fus, MD  empagliflozin (JARDIANCE) 10 MG TABS tablet Take 1 tablet (10 mg total) by mouth daily before breakfast. Patient not taking: Reported on 10/24/2022 09/07/22   Maisie Fus, MD  furosemide (LASIX) 40 MG tablet Take 1 tablet (40 mg total) by mouth daily. Patient not taking: Reported on 10/24/2022 06/04/22   Maisie Fus, MD  metFORMIN (GLUCOPHAGE-XR) 500 MG 24 hr tablet Take 1 tablet (500 mg total) by mouth daily with breakfast. Patient not taking: Reported on 10/24/2022 01/07/22   Maisie Fus, MD  metoprolol succinate (TOPROL-XL) 50 MG 24 hr tablet Take 1 tablet (50 mg total) by mouth daily. Patient not taking: Reported  on 10/24/2022 06/04/22   Janina Mayo, MD  sacubitril-valsartan (ENTRESTO) 24-26 MG Take 1 tablet by mouth 2 (two) times daily. Patient not taking: Reported on 10/24/2022 09/07/22   Janina Mayo, MD    Physical Exam: Vitals:   10/24/22 1730 10/24/22 1732 10/24/22 1735 10/24/22 1815  BP: (!) 106/57   127/71  Pulse: (!) 59 61 66 69  Resp: (!) 21 (!) 24 (!) 22 14  Temp:      TempSrc:      SpO2: 98% 97% 97% 99%   General:  Appears calm and comfortable and is in NAD, on [place] O2 Eyes:  PERRL, EOMI, normal lids, iris ENT:  grossly normal hearing, lips & tongue, mmm Neck:  no LAD, masses or thyromegaly Cardiovascular:   RRR, no m/r/g. No LE edema. AICD in place in L upper chest Respiratory:   CTA bilaterally with no wheezes/rales/rhonchi.  Normal respiratory effort. Abdomen:  soft, NT, ND Back:   normal alignment, no CVAT Skin:  no rash or induration seen on limited exam Musculoskeletal:  grossly normal tone BUE/BLE, good ROM, no bony abnormality Psychiatric:  grossly normal mood and affect, speech fluent and appropriate, AOx3 Neurologic:  CN 2-12 grossly intact, moves all extremities in coordinated fashion   Radiological Exams on Admission: Independently reviewed - see discussion in A/P where applicable  DG Chest 2 View  Result Date: 10/23/2022 CLINICAL DATA:  Shortness of breath and chest tightness. EXAM: CHEST - 2 VIEW COMPARISON:  June 23, 2022 FINDINGS: There is stable dual lead AICD positioning. The heart size and mediastinal contours are within normal limits. A coronary artery stent is in place. There is marked severity calcification of the aortic arch. Mild diffusely increased interstitial lung markings are seen. Mild linear atelectasis is noted within the mid right lung. There is no evidence of a pleural effusion or pneumothorax. The visualized skeletal structures are unremarkable. IMPRESSION: Mild interstitial edema with mild linear atelectasis within the mid right lung. Electronically Signed   By: Virgina Norfolk M.D.   On: 10/23/2022 22:34    EKG: Independently reviewed.  NSR with rate 95; nonspecific ST changes with no evidence of acute ischemia   Labs on Admission: I have personally reviewed the available labs and imaging studies at the time of the admission.  Pertinent labs:    VBG: 7.235/48.2/20.4 CO2 20 Glucose 193 BUN 26/Creatinine 1.25/GFR >60 BNP 729.5 HS troponin 152, 163 Normal CBC COVID/flu/RSV negative   Assessment and Plan: Principal Problem:   Acute on chronic systolic (congestive) heart failure (HCC) Active Problems:   Coronary artery disease   Essential  hypertension   Type 2 diabetes mellitus without complication, without long-term current use of insulin (HCC)   Hyperlipidemia associated with type 2 diabetes mellitus (HCC)   History of cardiac arrest    Acute on chronic systolic CHF -Patient with known h/o chronic systolic CHF presenting with worsening SOB and hypoxia -CXR consistent with mild pulmonary edema -Mildly elevated BNP  -With elevated BNP and abnl CXR, acute decompensated CHF seems probable as diagnosis -Will admit, as per the Emergency HF Mortality Risk Grade.  The patient has: severe pulmonary edema requiring new O2 therapy -Will request echocardiogram -He ran out of home medications - will resume spironolactone, Toprol XL, Entresto -Continue to hold Jardiance for now -Will continue ASA -CHF order set utilized; may need CHF team consult but will hold until Echo results are available -Was given Lasix 40 mg x 1 in ER and will repeat  with 40 mg IV BID -Continue [place] O2 for now -Stable kidney function at this time, will follow  HTN -Resume Entresto, spironolactone, Toprol XL -Will also add prn hydralazine  HLD -Continue Lipitor  DM -Last A1c was 7.8 -Hold metformin, Jardiance -Will cover with moderate-scale SSI for now  CAD -h/o ischemic VT and cardiac arrest in 04/2021 -Had chest discomfort prior to arrival with mildly elevated troponin and negative delta -Suspect demand ischemia -Echo pending and this will determine whether cardiology inpatient consult is needed -Patient was briefly d/w Dr. Cristal Deer and they are available if needed as inpatient but will otherwise be happy to f/u in clinic       Advance Care Planning:   Code Status: Full Code - Code status was discussed with the patient and/or family at the time of admission.  The patient would want to receive full resuscitative measures at this time.   Consults: CHF navigator; TOC team; nutrition  DVT Prophylaxis: Lovenox  Family Communication: Nephew  was present throughout evaluation  Severity of Illness: The appropriate patient status for this patient is INPATIENT. Inpatient status is judged to be reasonable and necessary in order to provide the required intensity of service to ensure the patient's safety. The patient's presenting symptoms, physical exam findings, and initial radiographic and laboratory data in the context of their chronic comorbidities is felt to place them at high risk for further clinical deterioration. Furthermore, it is not anticipated that the patient will be medically stable for discharge from the hospital within 2 midnights of admission.   * I certify that at the point of admission it is my clinical judgment that the patient will require inpatient hospital care spanning beyond 2 midnights from the point of admission due to high intensity of service, high risk for further deterioration and high frequency of surveillance required.*  Author: Jonah Blue, MD 10/24/2022 6:37 PM  For on call review [email or web address].

## 2022-10-25 LAB — CBC
HCT: 37.1 % — ABNORMAL LOW (ref 39.0–52.0)
Hemoglobin: 12.1 g/dL — ABNORMAL LOW (ref 13.0–17.0)
MCH: 29.4 pg (ref 26.0–34.0)
MCHC: 32.6 g/dL (ref 30.0–36.0)
MCV: 90 fL (ref 80.0–100.0)
Platelets: 150 10*3/uL (ref 150–400)
RBC: 4.12 MIL/uL — ABNORMAL LOW (ref 4.22–5.81)
RDW: 12.7 % (ref 11.5–15.5)
WBC: 8.9 10*3/uL (ref 4.0–10.5)
nRBC: 0 % (ref 0.0–0.2)

## 2022-10-25 LAB — GLUCOSE, CAPILLARY
Glucose-Capillary: 162 mg/dL — ABNORMAL HIGH (ref 70–99)
Glucose-Capillary: 158 mg/dL — ABNORMAL HIGH (ref 70–99)
Glucose-Capillary: 226 mg/dL — ABNORMAL HIGH (ref 70–99)
Glucose-Capillary: 172 mg/dL — ABNORMAL HIGH (ref 70–99)

## 2022-10-25 LAB — BASIC METABOLIC PANEL
Anion gap: 7 (ref 5–15)
BUN: 34 mg/dL — ABNORMAL HIGH (ref 8–23)
CO2: 22 mmol/L (ref 22–32)
Calcium: 8.1 mg/dL — ABNORMAL LOW (ref 8.9–10.3)
Chloride: 105 mmol/L (ref 98–111)
Creatinine, Ser: 1.32 mg/dL — ABNORMAL HIGH (ref 0.61–1.24)
GFR, Estimated: 60 mL/min — ABNORMAL LOW (ref >60)
Glucose, Bld: 184 mg/dL — ABNORMAL HIGH (ref 70–99)
Potassium: 4 mmol/L (ref 3.5–5.1)
Sodium: 134 mmol/L — ABNORMAL LOW (ref 135–145)

## 2022-10-25 MED ORDER — POLYETHYLENE GLYCOL 3350 17 G PO PACK
17.0000 g | PACK | Freq: Every day | ORAL | Status: DC
  Administered 2022-10-26: 17 g via ORAL
  Filled 2022-10-25: qty 1

## 2022-10-25 MED ORDER — PROSOURCE PLUS PO LIQD
30.0000 mL | Freq: Every day | ORAL | Status: DC
  Administered 2022-10-25: 30 mL via ORAL
  Filled 2022-10-25: qty 30

## 2022-10-25 MED ORDER — SENNOSIDES-DOCUSATE SODIUM 8.6-50 MG PO TABS
1.0000 | ORAL_TABLET | Freq: Two times a day (BID) | ORAL | Status: DC
  Administered 2022-10-25 – 2022-10-26 (×2): 1 via ORAL
  Filled 2022-10-25 (×2): qty 1

## 2022-10-25 MED ORDER — FUROSEMIDE 10 MG/ML IJ SOLN
40.0000 mg | Freq: Every day | INTRAMUSCULAR | Status: DC
  Administered 2022-10-26: 40 mg via INTRAVENOUS
  Filled 2022-10-25: qty 4

## 2022-10-25 NOTE — Progress Notes (Addendum)
[facility] PROGRESS NOTE  Pedro Kemp (DOB: [birth year]) ZOX:[id number] PCP: [name], NP   Brief Narrative: Pedro Kemp is a 67 y.o. male with a history of CAD s/p mid LAD shockwave and PCI, diagonal PCI July 2022, VT arrest s/p ICD, HFrEF, T2DM who presented to the ED on 10/23/2022 with shortness of breath worsening in the setting of running out of CHF medications for about a month due to cost. He also had some epigastric/substernal discomfort which resolved by the time in ED. Troponin 152 > 160 without ischemic ECG changes. BNP 729. CXR showed pulmonary edema and the patient was hypoxemic. Diuresis was restarted and patient admitted.   Subjective: Breathing has improved significantly but not back to baseline. No chest pain even with exertion. Had some swelling that is improved per pt. Nephew at bedside. Spanish video interpretor Caren Griffins [phone]) utilized throughout encounter.  Objective: BP 109/65 (BP Location: Right Arm)   Pulse 64   Temp (!) 97.5 F (36.4 C) (Oral)   Resp 15   Ht 5' 4.57" (1.64 m)   Wt 68.8 kg   SpO2 97%   BMI 25.58 kg/m   Gen: No distress Pulm: Crackles at bases, nonlabored on room air  CV: RRR, no MRG. Trace edema GI: Soft, NT, ND, +BS  Neuro: Alert and oriented. No new focal deficits. Ext: Warm, R foot TMA noted. Skin: No rashes, lesions or ulcers on visualized skin. AICD palpable and nontender in left upper chest.   ECG (personal review of 10/23/2022): NSR, vent rate 91bpm, Wander and artifact complicate interpretation but T wave inversions in I, aVL, lateral precordial leads is stable from prior. No acute ST changes.   Assessment & Plan: Acute on chronic HFrEF, HTN: Echo repeated, stable, with LVEF 30-35%, LV dilation, global hypokinesis, indeterminate diastolic function, mild-mod MR.  - Continue IV lasix today, possibly transition to PO 1/8. Good diuresis but weight stable, still with crackles on exam. - Restarted GDMT with  entresto, metoprolol succinate, spironolactone, SGLT2i. Will involve TOC to assist with medications.  - Has cardiology follow up 1/22 with Dr. Sallyanne Kuster.   CAD s/p DES to LAD and D1 July 2022 at [place], current demand myocardial ischemia, HLD: No regional wall motion abnormalities. Troponin elevation without ischemic ECG changes and no further chest pain in the setting of acute CHF, no inpatient evaluation is planned.  - Continue ASA. Plavix was stopped since intervention was > 12 months ago.  - Continue BB, statin (last LDL was 31)  T2DM: HbA1c 7.8%.  - Continue jardiance. Renal function adequate for metformin at discharge currently.   History of VT arrest: s/p DC [employer] ICD 05/20/2021   Elevated creatinine: Based on recent past values, suspect this represents stage IIIa CKD.  - Avoid nephrotoxins, monitor BMP with diuresis.   Patrecia Pour, MD [facility] [email or web address] 10/25/2022, 2:21 PM

## 2022-10-25 NOTE — Progress Notes (Signed)
Initial Nutrition Assessment RD working remotely.   DOCUMENTATION CODES:   Not applicable  INTERVENTION:  - ordered 30 ml Prosource Plus once/day, each supplement provides 100 kcal and 15 grams protein.   - allow double protein portions at meals (entered into [facility]).  - assess for education needs prior to d/c.   NUTRITION DIAGNOSIS:   Increased nutrient needs related to acute illness as evidenced by estimated needs.  GOAL:   Patient will meet greater than or equal to 90% of their needs  MONITOR:   PO intake, Supplement acceptance, Labs, Weight trends  REASON FOR ASSESSMENT:   Consult Assessment of nutrition requirement/status  ASSESSMENT:   67 y.o. male with medical history of CAD, cardiac arrest, chronic systolic CHF (EF 17-49%), and DM. He presented to the ED due to chest pain and shortness of breath.  Patient noted to require a [place] interpreter. Documentation in the flow sheet indicates he ate 100% of breakfast and 100% of lunch today but only lunch delivery documented in [facility].   He has not been assessed by a [place] RD at any time in the past.   Weight today is 152 lb and weight on 09/07/22 was 162 lb which indicates 10 lb (6.2%) in ~6 weeks. Prior to that, weight was stable at 151-157 lb from 12/05/21-06/04/22. No information documented in the edema section of flow sheet.  Per notes: - acute on chronic CHF - hx of DM with most recent HgbA1c: 7.8%   Labs reviewed; CBGs: 162 and 158 mg/dl, Na: 134 mmol/l, BUN: 34 mg/dl, creatinine: 1.32 mg/dl, Ca: 8.1 mg/dl, GFR: 60 ml/min.  Medications reviewed; 100 mg colace BID, 40 mg IV lasix/day, sliding scale novolog, 25 mg aldactone/day.     NUTRITION - FOCUSED PHYSICAL EXAM:  RD working remotely.  Diet Order:   Diet Order             Diet heart healthy/carb modified Room service appropriate? Yes; Fluid consistency: Thin; Fluid restriction: 1500 mL Fluid  Diet effective now                    EDUCATION NEEDS:   Not appropriate for education at this time  Skin:  Skin Assessment: Reviewed RN Assessment  Last BM:  PTA/unknown  Height:   Ht Readings from Last 1 Encounters:  10/24/22 5' 4.57" (1.64 m)    Weight:   Wt Readings from Last 1 Encounters:  10/25/22 68.8 kg     BMI:  Body mass index is 25.58 kg/m.  Estimated Nutritional Needs:  Kcal:  1750-1950 kcal Protein:  85-100 grams Fluid:  >/= 1.8 L/day     Jarome Matin, MS, RD, LDN, CNSC Clinical Dietitian PRN/Relief staff On-call/weekend pager # available in [facility]

## 2022-10-26 ENCOUNTER — Encounter (HOSPITAL_COMMUNITY): Payer: Self-pay | Admitting: *Deleted

## 2022-10-26 ENCOUNTER — Other Ambulatory Visit (HOSPITAL_COMMUNITY): Payer: Self-pay

## 2022-10-26 LAB — BASIC METABOLIC PANEL
Anion gap: 6 (ref 5–15)
BUN: 38 mg/dL — ABNORMAL HIGH (ref 8–23)
CO2: 22 mmol/L (ref 22–32)
Calcium: 8 mg/dL — ABNORMAL LOW (ref 8.9–10.3)
Chloride: 105 mmol/L (ref 98–111)
Creatinine, Ser: 1.33 mg/dL — ABNORMAL HIGH (ref 0.61–1.24)
GFR, Estimated: 59 mL/min — ABNORMAL LOW (ref >60)
Glucose, Bld: 201 mg/dL — ABNORMAL HIGH (ref 70–99)
Potassium: 4.2 mmol/L (ref 3.5–5.1)
Sodium: 133 mmol/L — ABNORMAL LOW (ref 135–145)

## 2022-10-26 LAB — GLUCOSE, CAPILLARY
Glucose-Capillary: 193 mg/dL — ABNORMAL HIGH (ref 70–99)
Glucose-Capillary: 208 mg/dL — ABNORMAL HIGH (ref 70–99)
Glucose-Capillary: 160 mg/dL — ABNORMAL HIGH (ref 70–99)

## 2022-10-26 MED ORDER — METFORMIN HCL ER 500 MG PO TB24
500.0000 mg | ORAL_TABLET | Freq: Every day | ORAL | 0 refills | Status: DC
  Filled 2022-10-26: qty 30, 30d supply, fill #0

## 2022-10-26 MED ORDER — METOPROLOL SUCCINATE ER 50 MG PO TB24
50.0000 mg | ORAL_TABLET | Freq: Every day | ORAL | 0 refills | Status: DC
  Filled 2022-10-26: qty 30, 30d supply, fill #0

## 2022-10-26 MED ORDER — FUROSEMIDE 40 MG PO TABS
40.0000 mg | ORAL_TABLET | Freq: Every day | ORAL | 0 refills | Status: DC
  Filled 2022-10-26: qty 30, 30d supply, fill #0

## 2022-10-26 MED ORDER — SACUBITRIL-VALSARTAN 24-26 MG PO TABS
1.0000 | ORAL_TABLET | Freq: Two times a day (BID) | ORAL | 0 refills | Status: DC
  Filled 2022-10-26: qty 60, 30d supply, fill #0

## 2022-10-26 MED ORDER — EMPAGLIFLOZIN 10 MG PO TABS
10.0000 mg | ORAL_TABLET | Freq: Every day | ORAL | 0 refills | Status: DC
  Filled 2022-10-26: qty 30, 30d supply, fill #0

## 2022-10-26 MED ORDER — SPIRONOLACTONE 25 MG PO TABS
25.0000 mg | ORAL_TABLET | Freq: Every day | ORAL | 0 refills | Status: DC
  Filled 2022-10-26: qty 30, 30d supply, fill #0

## 2022-10-26 NOTE — Plan of Care (Signed)
Discharge instructions discussed with patient via interpeter [phone].   Patient instructed on home medications, restrictions, and follow up appointments. Belongings gathered and sent with patient.  Patients medications at bedside by TOC.  Patient discharged via wheelchair by this Probation officer.

## 2022-10-26 NOTE — TOC Transition Note (Signed)
Transition of Care [facility]) - CM/SW Discharge Note   Patient Details  Name: Pedro Kemp MRN: [id number] Date of Birth: Dec 21, 1955  [facility] CM/SW Contact:  Levonne Lapping, RN Phone Number: 10/26/2022, 2:36 PM   Clinical Narrative:     CM met patient bedside and utilized interpreter screen. Patient was accompanied by his Family all of whom were non-English speaking. CM assessed patients social determinants of health and resources were provided in [place] (Education officer, museum) for financial, food and transportation needs. Patient will return to home with Family at DC. A follow up appointment at the [facility] has been made by CM for patient. No additional TOC needs        Barriers to Discharge: Inadequate or no insurance   Patient Goals and CMS Choice      Discharge Placement   Home with Family                        Discharge Plan and Services Additional resources added to the After Visit Summary for                                       Social Determinants of Health (SDOH) Interventions SDOH Screenings   Food Insecurity: Food Insecurity Present (10/26/2022)  Housing: Low Risk  (10/26/2022)  Transportation Needs: Unmet Transportation Needs (10/26/2022)  Utilities: Not At Risk (10/26/2022)  Alcohol Screen: Low Risk  (10/26/2022)  Depression (PHQ2-9): Low Risk  (02/09/2022)  Financial Resource Strain: High Risk (10/26/2022)  Tobacco Use: Low Risk  (10/26/2022)     Readmission Risk Interventions     No data to display

## 2022-10-26 NOTE — TOC Progression Note (Signed)
Transition of Care [facility]) - Progression Note    Patient Details  Name: Pedro Kemp MRN: [id number] Date of Birth: Dec 27, 1955  Transition of Care [facility]) CM/SW Contact  Levonne Lapping, RN Phone Number: 10/26/2022, 2:30 PM  Clinical Narrative:     CM met patient bedside and utilized interpreter screen. Patient was accompanied by his Family all of whom were non-English speaking.  CM assessed patients social determinants of health and resources were provided in [place] (Education officer, museum) for financial, food and transportation needs. Patient will return to home with Family at DC.  A follow up appointment at the [facility] has been made by CM for patient. No additional TOC needs     Barriers to Discharge: Inadequate or no insurance  Expected Discharge Plan and Services       Living arrangements for the past 2 months: Single Family Home Expected Discharge Date: 10/26/22                                     Social Determinants of Health (SDOH) Interventions SDOH Screenings   Food Insecurity: Food Insecurity Present (10/26/2022)  Housing: Low Risk  (10/26/2022)  Transportation Needs: Unmet Transportation Needs (10/26/2022)  Utilities: Not At Risk (10/26/2022)  Alcohol Screen: Low Risk  (10/26/2022)  Depression (PHQ2-9): Low Risk  (02/09/2022)  Financial Resource Strain: High Risk (10/26/2022)  Tobacco Use: Low Risk  (10/26/2022)    Readmission Risk Interventions     No data to display

## 2022-10-26 NOTE — Progress Notes (Addendum)
CSW received consult for food and transportation resources for patient.CSW spoke with patient at bedside with Video interpretor spanish Regulatory affairs officer # [phone]. CSW offered patient  food resources and transportation resources. Patient accepted. All questions answered. No further questions reported at this time.

## 2022-10-26 NOTE — Progress Notes (Signed)
Heart Failure Stewardship Pharmacist Progress Note   PCP: Grayce Sessions, NP PCP-Cardiologist: Maisie Fus, MD    HPI:  67 yo M with PMH of T2DM, HLD, CAD, and CHF.   He was admitted at [facility] 05/14/2021 to 05/22/2021. Presented with chest pain, noted to have high risk NSTEMI and acute CHF with EF 35%. He had troponin up to 15k. He had cath on 7/27 that showed 100% occluded mid LAD s/p shockwave and PCI. He also had disease in his diag s/p PCI. He subsequently had VT arrest 7/29 with ACLS and ROSC. He went back to the cath lab on 7/29 and stent was patent. This was concerning for scar mediated VT. He had an EP study on 8/2 with inducible sustained monomorphic VT. Managed with amiodarone. He underwent DC-ICD. He had a moderate sized hematoma at the ICD site requiring monitoring. He was also managed for acute respiratory failure 2/2 CHF exacerbation. SCr on discharge was 1.38. A1c was 9 %. He was discharged on asa 81 mg daily, atorvastatin 80 mg daily, plavix 75 mg daily, lisinopril 20 mg daily, lasix 80 mg daily, metop XL 50 mg daily. Started on Jardiance 10 mg.   Admitted in 01/2022 with acute on chronic HFrEF after stopping lasix as instructed by PCP. Diuresed with IV lasix. Continued on metoprolol and Jardiance, started on PO lasix at discharge.  Presented to the ED on 1/5 with shortness of breath and chest tightness. Reported he ran out of medications 1 month prior. CXR with mild interstitial edema. ECHO 1/6 with LVEF 30-35%, global hypokinesis, RV normal.   Discharge HF Medications: Diuretic: furosemide 40 mg daily Beta Blocker: metoprolol XL 50 mg daily ACE/ARB/ARNI: Entresto 24/26 mg BID MRA: spironolactone 25 mg daily SGLT2i: Jardiance 10 mg daily  Prior to admission HF Medications: None - was not taking meds over the last month due to cost  Pertinent Lab Values: Serum creatinine 1.33, BUN 38, Potassium 4.2, Sodium 133, BNP 729.5  Vital Signs: Weight: 153 lbs (admission  weight: 151 lbs) Blood pressure: 100-120/70s  Heart rate: 60s  I/O: -2.2L yesterday; net -4.4L  Medication Assistance / Insurance Benefits Check: Does the patient have prescription insurance?  No  Does the patient qualify for medication assistance through manufacturers or grants?   Yes Eligible grants and/or patient assistance programs: Valla Leaver Medication assistance applications in progress: Valla Leaver  Medication assistance applications approved: none Approved medication assistance renewals will be completed by: pending  Outpatient Pharmacy:  Prior to admission outpatient pharmacy: [facility] Is the patient willing to use [facility] TOC pharmacy at discharge? Yes   Assessment: 1. Acute on chronic systolic CHF (LVEF 30-35%), due to ICM. NYHA class II symptoms. - Continue furosemide 40 mg daily for discharge - Continue metoprolol XL 50 mg daily - Continue Entresto 24/26 mg BID - Continue spironolactone 25 mg daily - Continue Jardiance 10 mg daily   Plan: 1) Medication changes recommended at this time: - Agree with discharge plan as above   2) Patient assistance: - Patient is uninsured - CSW with [facility] assisted with [place] [employer] and patient assistance applications, however, patient and family have not completed the applications for submission yet. Will use MATCH and free 30 day cards at discharge and will assist with completion of medication resources at [facility] clinic  3)  Education  - Patient has been educated on current HF medications and potential additions to HF medication regimen - Patient verbalizes understanding that over the next few  months, these medication doses may change and more medications may be added to optimize HF regimen - Patient has been educated on basic disease state pathophysiology and goals of therapy   Kerby Nora, PharmD, BCPS Heart Failure Stewardship Pharmacist Phone [phone]

## 2022-10-26 NOTE — Progress Notes (Signed)
Heart Failure Nurse Navigator Progress Note  PCP: [name], NP PCP-Cardiologist: Dr. Harl Bowie Admission Diagnosis: Congestive heart failure, hypoxia. Admitted from: Home  Presentation:   Pedro Kemp presented with chest pain, shortness of breath, reported he ran out of his medications over a month ago. BP 126/73, HR 64, BNP 729.5, Troponin 163, IV lasix given, CXR with mild interstitial edema, ECHO 1/6 with LVEF 30-35%.   With interpretor Cori Razor # [id number]) patietn and family were educated on the sign and symptoms of heart failure, daily weights, when to call his doctor or go to the ED, Diet/ fluid restrictions, taking all medications as prescribed, Pharm helping with medication costs, attending all medical appointments, patient verbalized his understanding, a Hospital HF [facility] appointment was scheduled for 11/06/2022.   ECHO/ LVEF: 30-35% HFrEF  Clinical Course:  Past Medical History:  Diagnosis Date   Cardiac arrest ([place])    CHF (congestive heart failure) (HCC)    Coronary artery disease    Diabetes mellitus without complication ([place])    H/O heart artery stent    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     Social History   Socioeconomic History   Marital status: Single    Spouse name: Not on file   Number of children: Not on file   Years of education: Not on file   Highest education level: Not on file  Occupational History   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  Tobacco Use   Smoking status: Never    Passive exposure: Never   Smokeless tobacco: Never  Substance and Sexual Activity   Alcohol use: Never   Drug use: Never   Sexual activity: Not Currently    Partners: Female  Other Topics Concern   Not on file  Social History Narrative   Not on file   Social Determinants of Health   Financial Resource Strain: High Risk (06/12/2022)   Overall Financial Resource Strain (CARDIA)    Difficulty of Paying Living Expenses: Hard  Food  Insecurity: Food Insecurity Present (10/26/2022)   Hunger Vital Sign    Worried About Running Out of Food in the Last Year: Never true    Ran Out of Food in the Last Year: Sometimes true  Transportation Needs: Unmet Transportation Needs (10/26/2022)   PRAPARE - Hydrologist (Medical): Yes    Lack of Transportation (Non-Medical): No  Physical Activity: Not on file  Stress: Not on file  Social Connections: Not on file   Education Assessment and Provision:  Detailed education and instructions provided on heart failure disease management including the following:  Signs and symptoms of Heart Failure When to call the physician Importance of daily weights Low sodium diet Fluid restriction Medication management Anticipated future follow-up appointments  Patient education given on each of the above topics.  Patient acknowledges understanding via teach back method and acceptance of all instructions.  Education Materials:  "Living Better With Heart Failure" Booklet, HF zone tool, & Daily Weight Tracker Tool.  Patient has scale at home: yes Patient has pill box at home: NA    High Risk Criteria for Readmission and/or Poor Patient Outcomes: Heart failure hospital admissions (last 6 months): 1  No Show rate: 6 % Difficult social situation: No Demonstrates medication adherence: No, due to costs Primary Language: Spanish Literacy level: 6th grade, reported he can read and write some.   Barriers of Care:   Spanish speaking Medication compliance R/T costs Diet/ fluid/  daily weights Continued HF education   Considerations/Referrals:   Referral made to Heart Failure Pharmacist Stewardship: Yes Referral made to Heart Failure CSW/NCM TOC: yes Referral made to Heart & Vascular TOC clinic: Yes, 11/06/22  Items for Follow-up on DC/TOC: Medication compliance r/t costs Diet/ fluid/ daily weights ( given a scale) Continued HF education   Rhae Hammock, BSN,  RN Heart Failure Print production planner Chat Only

## 2022-10-26 NOTE — Discharge Summary (Signed)
Physician Discharge Summary   Patient: Pedro Kemp MRN: [id number] DOB: 03/10/1956  Admit date:     10/23/2022  Discharge date: 10/26/22  Discharge Physician: Tyrone Nine   PCP: Grayce Sessions, NP   Recommendations at discharge:  Follow up with cardiology as scheduled 1/19. Suggest follow up BMP, suspect patient has CKD stage IIIa.  Follow up with PCP in 1-2 weeks for ongoing diabetes management.   Discharge Diagnoses: Principal Problem:   Acute on chronic systolic (congestive) heart failure (HCC) Active Problems:   Coronary artery disease   Essential hypertension   Type 2 diabetes mellitus without complication, without long-term current use of insulin (HCC)   Hyperlipidemia associated with type 2 diabetes mellitus (HCC)   History of cardiac arrest  Hospital Course: Pedro Kemp is a 67 y.o. male with a history of CAD s/p mid LAD shockwave and PCI, diagonal PCI July 2022, VT arrest s/p ICD, HFrEF, T2DM who presented to the ED on 10/23/2022 with shortness of breath worsening in the setting of running out of CHF medications for about a month due to cost. He also had some epigastric/substernal discomfort which resolved by the time in ED. Troponin 152 > 160 without ischemic ECG changes. BNP 729. CXR showed pulmonary edema and the patient was hypoxemic. Diuresis was restarted and patient admitted with improvement in symptoms. Appears euvolemic on exam on 1/8 and will be discharged after [facility] assists with procuring medications.   Assessment and Plan: Acute on chronic HFrEF, HTN: Echo repeated, stable, with LVEF 30-35%, LV dilation, global hypokinesis, indeterminate diastolic function, mild-mod MR.  - Continue lasix at 40mg  dose daily. Appears euvolemic at discharge. Weight is 68-69kg.  - Restarted GDMT with entresto, metoprolol succinate, spironolactone, SGLT2i. TOC to assist with medications.  - Has cardiology follow up 1/19 for post discharge evaluation. Also has preexisting  appointment 1/22 with Dr. 2/22 as well.    CAD s/p DES to LAD and D1 July 2022 at [facility], current demand myocardial ischemia, HLD: No regional wall motion abnormalities. Troponin elevation without ischemic ECG changes and no further chest pain in the setting of acute CHF, no inpatient evaluation is planned.  - Continue ASA. Plavix was stopped since intervention was > 12 months ago.  - Continue BB, statin (last LDL was 31)   T2DM: HbA1c 20 April 2021.  - Continue jardiance. Renal function adequate for metformin at discharge currently. - Follow up with PCP for ongoing medication titration.    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  - Avoid nephrotoxins, monitor BMP at follow up  Consultants: TOC Procedures performed: None  Disposition: Home Diet recommendation: Heart healthy DISCHARGE MEDICATION: Allergies as of 10/26/2022   No Known Allergies      Medication List     TAKE these medications    aspirin EC 81 MG tablet Take 81 mg by mouth daily. Swallow whole.   atorvastatin 20 MG tablet Commonly known as: LIPITOR Tome 1 tableta (20 mg en total) por va oral diariamente. (Take 1 tablet (20 mg total) by mouth daily.)   empagliflozin 10 MG Tabs tablet Commonly known as: Jardiance Tome 1 tableta (10 mg en total) por va oral diariamente antes de desayunar. (Take 1 tablet (10 mg total) by mouth daily before breakfast.)   furosemide 40 MG tablet Commonly known as: LASIX Tome 1 tableta (40 mg en total) por va oral diariamente. (Take 1 tablet (40 mg total) by  mouth daily.)   metFORMIN 500 MG 24 hr tablet Commonly known as: GLUCOPHAGE-XR Tome 1 tableta (500 mg en total) por va oral diariamente con el desayuno. (Take 1 tablet (500 mg total) by mouth daily with breakfast.)   metoprolol succinate 50 MG 24 hr tablet Commonly known as: TOPROL-XL Tome 1 tableta (50 mg en total) por va oral diariamente. (Take 1 tablet (50  mg total) by mouth daily.)   sacubitril-valsartan 24-26 MG Commonly known as: ENTRESTO Take 1 tablet by mouth 2 (two) times daily.   spironolactone 25 MG tablet Commonly known as: ALDACTONE Tome 1 tableta (25 mg en total) por va oral diariamente. (Take 1 tablet (25 mg total) by mouth daily.)        Follow-up Information     Croitoru, Mihai, MD Follow up on 11/09/2022.   Specialty: Cardiology Why: appoitnment at 1020 am Contact information: [address] [place] [address] [phone]         [facility]. Go in 11 day(s).   Specialty: Cardiology Why: Hospital follow up 11/06/2022 @ 10 am PLEASE bring a current medication list to appointment FREE valet parking, Entrance C, off [address] [employer] information: [address] [id number] mc [place] [address] [phone]        Grayce Sessions, NP Follow up.   Specialty: Internal Medicine Contact information: [address] [place] [address] [phone]                Discharge Exam: Filed Weights   10/24/22 2304 10/25/22 0622 10/26/22 0626  Weight: 68.8 kg 68.8 kg 69.8 kg  BP 139/73   Pulse 69   Temp 97.8 F (36.6 C) (Oral)   Resp 16   Ht 5' 4.57" (1.64 m)   Wt 69.8 kg   SpO2 96%   BMI 25.95 kg/m   66yo M well-appearing in no distress speaking clearly with Spanish video interpretor Larene Pickett [phone]) used throughout encounter Nonlabored, clear RRR, no MRG or edema  Condition at discharge: stable  The results of significant diagnostics from this hospitalization (including imaging, microbiology, ancillary and laboratory) are listed below for reference.   Imaging Studies: ECHOCARDIOGRAM COMPLETE  Result Date: 10/24/2022    ECHOCARDIOGRAM REPORT   Patient Name:   Pedro Kemp Date of Exam: 10/24/2022 Medical Rec #:  [id number]         Height:       64.0 in Accession #:    [id number]        Weight:        162.0 lb Date of Birth:  09-Aug-1956         BSA:          1.789 m Patient Age:    66 years          BP:           130/72 mmHg Patient Gender: M                 HR:           67 bpm. Exam Location:  Inpatient Procedure: 2D Echo and Intracardiac Opacification Agent Indications:    CHF  History:        Patient has prior history of Echocardiogram examinations, most                 recent 12/16/2021. CHF, CAD and Previous Myocardial Infarction,  Pacemaker; Risk Factors:Hypertension, Diabetes and Dyslipidemia.  Sonographer:    Cathie Hoops Referring Phys: [id number] JENNIFER YATES IMPRESSIONS  1. Left ventricular ejection fraction, by estimation, is 30 to 35%. The left ventricle has moderately decreased function. The left ventricle demonstrates global hypokinesis. The left ventricular internal cavity size was moderately to severely dilated. Left ventricular diastolic parameters are indeterminate.  2. Right ventricular systolic function is normal. The right ventricular size is normal. There is normal pulmonary artery systolic pressure.  3. Left atrial size was moderately dilated.  4. The mitral valve is normal in structure. Mild to moderate mitral valve regurgitation. No evidence of mitral stenosis.  5. The aortic valve is tricuspid. Aortic valve regurgitation is not visualized. No aortic stenosis is present. Comparison(s): Prior images reviewed side by side. Conclusion(s)/Recommendation(s): EF remains reduced, with severe dilation of LV. MR better appreciated on current study. FINDINGS  Left Ventricle: Left ventricular ejection fraction, by estimation, is 30 to 35%. The left ventricle has moderately decreased function. The left ventricle demonstrates global hypokinesis. Definity contrast agent was given IV to delineate the left ventricular endocardial borders. The left ventricular internal cavity size was moderately to severely dilated. There is no left ventricular hypertrophy. Left ventricular diastolic  parameters are indeterminate. Right Ventricle: The right ventricular size is normal. Right vetricular wall thickness was not well visualized. Right ventricular systolic function is normal. There is normal pulmonary artery systolic pressure. The tricuspid regurgitant velocity is 1.94 m/s, and with an assumed right atrial pressure of 3 mmHg, the estimated right ventricular systolic pressure is 18.1 mmHg. Left Atrium: Left atrial size was moderately dilated. Right Atrium: Right atrial size was normal in size. Pericardium: Trivial pericardial effusion is present. Mitral Valve: The mitral valve is normal in structure. Mild to moderate mitral valve regurgitation. No evidence of mitral valve stenosis. Tricuspid Valve: The tricuspid valve is normal in structure. Tricuspid valve regurgitation is trivial. No evidence of tricuspid stenosis. Aortic Valve: The aortic valve is tricuspid. Aortic valve regurgitation is not visualized. No aortic stenosis is present. Aortic valve mean gradient measures 3.0 mmHg. Aortic valve peak gradient measures 5.2 mmHg. Aortic valve area, by VTI measures 1.87 cm. Pulmonic Valve: The pulmonic valve was not well visualized. Pulmonic valve regurgitation is trivial. No evidence of pulmonic stenosis. Aorta: The aortic root and ascending aorta are structurally normal, with no evidence of dilitation. IAS/Shunts: The atrial septum is grossly normal. Additional Comments: A device lead is visualized.  LEFT VENTRICLE PLAX 2D LVIDd:         6.20 cm      Diastology LVIDs:         4.90 cm      LV e' medial:    7.83 cm/s LV PW:         1.00 cm      LV E/e' medial:  13.3 LV IVS:        1.00 cm      LV e' lateral:   6.85 cm/s LVOT diam:     2.00 cm      LV E/e' lateral: 15.2 LV SV:         45 LV SV Index:   25 LVOT Area:     3.14 cm  LV Volumes (MOD) LV vol d, MOD A2C: 155.0 ml LV vol d, MOD A4C: 169.0 ml LV vol s, MOD A2C: 90.2 ml LV vol s, MOD A4C: 114.0 ml LV SV MOD A2C:     64.8 ml LV SV MOD A4C:  169.0  ml LV SV MOD BP:      63.1 ml RIGHT VENTRICLE RV Basal diam:  3.50 cm RV Mid diam:    2.70 cm RV S prime:     11.60 cm/s TAPSE (M-mode): 1.8 cm LEFT ATRIUM             Index        RIGHT ATRIUM           Index LA diam:        5.00 cm 2.80 cm/m   RA Area:     11.60 cm LA Vol (A2C):   51.9 ml 29.01 ml/m  RA Volume:   27.30 ml  15.26 ml/m LA Vol (A4C):   77.3 ml 43.21 ml/m LA Biplane Vol: 66.3 ml 37.06 ml/m  AORTIC VALVE                    PULMONIC VALVE AV Area (Vmax):    1.95 cm     PV Vmax:       1.00 m/s AV Area (Vmean):   1.84 cm     PV Peak grad:  4.0 mmHg AV Area (VTI):     1.87 cm AV Vmax:           114.00 cm/s AV Vmean:          77.000 cm/s AV VTI:            0.239 m AV Peak Grad:      5.2 mmHg AV Mean Grad:      3.0 mmHg LVOT Vmax:         70.70 cm/s LVOT Vmean:        45.100 cm/s LVOT VTI:          0.142 m LVOT/AV VTI ratio: 0.59  AORTA Ao Root diam: 3.10 cm Ao Asc diam:  3.20 cm MITRAL VALVE                  TRICUSPID VALVE MV Area (PHT): 5.13 cm       TR Peak grad:   15.1 mmHg MV Decel Time: 148 msec       TR Vmax:        194.00 cm/s MR Peak grad:    76.4 mmHg MR Mean grad:    57.0 mmHg    SHUNTS MR Vmax:         437.00 cm/s  Systemic VTI:  0.14 m MR Vmean:        359.0 cm/s   Systemic Diam: 2.00 cm MR PISA:         1.01 cm MR PISA Eff ROA: 7 mm MR PISA Radius:  0.40 cm MV E velocity: 104.00 cm/s MV A velocity: 35.10 cm/s MV E/A ratio:  2.96 Buford Dresser MD Electronically signed by Buford Dresser MD Signature Date/Time: 10/24/2022/8:23:44 PM    Final    DG Chest 2 View  Result Date: 10/23/2022 CLINICAL DATA:  Shortness of breath and chest tightness. EXAM: CHEST - 2 VIEW COMPARISON:  June 23, 2022 FINDINGS: There is stable dual lead AICD positioning. The heart size and mediastinal contours are within normal limits. A coronary artery stent is in place. There is marked severity calcification of the aortic arch. Mild diffusely increased interstitial lung markings are seen. Mild  linear atelectasis is noted within the mid right lung. There is no evidence of a pleural effusion or pneumothorax. The visualized skeletal structures are unremarkable. IMPRESSION: Mild interstitial edema with mild  linear atelectasis within the mid right lung. Electronically Signed   By: Aram Candelahaddeus  Houston M.D.   On: 10/23/2022 22:34    Microbiology: Results for orders placed or performed during the hospital encounter of 10/23/22  Resp panel by RT-PCR (RSV, Flu A&B, Covid) Anterior Nasal Swab     Status: None   Collection Time: 10/23/22 10:05 PM   Specimen: Anterior Nasal Swab  Result Value Ref Range Status   SARS Coronavirus 2 by RT PCR NEGATIVE NEGATIVE Final    Comment: (NOTE) SARS-CoV-2 target nucleic acids are NOT DETECTED.  The SARS-CoV-2 RNA is generally detectable in upper respiratory specimens during the acute phase of infection. The lowest concentration of SARS-CoV-2 viral copies this assay can detect is 138 copies/mL. A negative result does not preclude SARS-Cov-2 infection and should not be used as the sole basis for treatment or other patient management decisions. A negative result may occur with  improper specimen collection/handling, submission of specimen other than nasopharyngeal swab, presence of viral mutation(s) within the areas targeted by this assay, and inadequate number of viral copies(<138 copies/mL). A negative result must be combined with clinical observations, patient history, and epidemiological information. The expected result is Negative.  Fact Sheet for Patients:  BloggerCourse.comhttps://www.fda.gov/media/152166/download  Fact Sheet for Healthcare Providers:  SeriousBroker.ithttps://www.fda.gov/media/152162/download  This test is no t yet approved or cleared by the [place] FDA and  has been authorized for detection and/or diagnosis of SARS-CoV-2 by FDA under an Emergency Use Authorization (EUA). This EUA will remain  in effect (meaning this test can be used) for the duration of  the COVID-19 declaration under Section 564(b)(1) of the Act, 21 U.S.C.section 360bbb-3(b)(1), unless the authorization is terminated  or revoked sooner.       Influenza A by PCR NEGATIVE NEGATIVE Final   Influenza B by PCR NEGATIVE NEGATIVE Final    Comment: (NOTE) The Xpert Xpress SARS-CoV-2/FLU/RSV plus assay is intended as an aid in the diagnosis of influenza from Nasopharyngeal swab specimens and should not be used as a sole basis for treatment. Nasal washings and aspirates are unacceptable for Xpert Xpress SARS-CoV-2/FLU/RSV testing.  Fact Sheet for Patients: BloggerCourse.comhttps://www.fda.gov/media/152166/download  Fact Sheet for Healthcare Providers: SeriousBroker.ithttps://www.fda.gov/media/152162/download  This test is not yet approved or cleared by the [place] FDA and has been authorized for detection and/or diagnosis of SARS-CoV-2 by FDA under an Emergency Use Authorization (EUA). This EUA will remain in effect (meaning this test can be used) for the duration of the COVID-19 declaration under Section 564(b)(1) of the Act, 21 U.S.C. section 360bbb-3(b)(1), unless the authorization is terminated or revoked.     Resp Syncytial Virus by PCR NEGATIVE NEGATIVE Final    Comment: (NOTE) Fact Sheet for Patients: BloggerCourse.comhttps://www.fda.gov/media/152166/download  Fact Sheet for Healthcare Providers: SeriousBroker.ithttps://www.fda.gov/media/152162/download  This test is not yet approved or cleared by the [place] FDA and has been authorized for detection and/or diagnosis of SARS-CoV-2 by FDA under an Emergency Use Authorization (EUA). This EUA will remain in effect (meaning this test can be used) for the duration of the COVID-19 declaration under Section 564(b)(1) of the Act, 21 U.S.C. section 360bbb-3(b)(1), unless the authorization is terminated or revoked.  Performed at [facility], [address] [place], [place] [id number]     Labs: CBC: Recent Labs  Lab 10/23/22 2219 10/24/22 0022  10/25/22 0127  WBC 10.5  --  8.9  NEUTROABS 4.6  --   --   HGB 14.5 14.6 12.1*  HCT 44.2 43.0 37.1*  MCV  90.8  --  90.0  PLT 176  --  150   Basic Metabolic Panel: Recent Labs  Lab 10/23/22 2219 10/24/22 0022 10/25/22 0127 10/26/22 0638  NA 136 140 134* 133*  K 3.6 3.8 4.0 4.2  CL 105  --  105 105  CO2 20*  --  22 22  GLUCOSE 193*  --  184* 201*  BUN 26*  --  34* 38*  CREATININE 1.25*  --  1.32* 1.33*  CALCIUM 8.6*  --  8.1* 8.0*   Liver Function Tests: Recent Labs  Lab 10/23/22 2219  AST 26  ALT 26  ALKPHOS 79  BILITOT 0.6  PROT 7.0  ALBUMIN 3.8   CBG: Recent Labs  Lab 10/25/22 1115 10/25/22 1614 10/25/22 2128 10/26/22 0751 10/26/22 1006  GLUCAP 158* 226* 172* 193* 208*    Discharge time spent: greater than 30 minutes.  Signed: Tyrone Nine, MD [facility] 10/26/2022

## 2022-10-27 ENCOUNTER — Telehealth (INDEPENDENT_AMBULATORY_CARE_PROVIDER_SITE_OTHER): Payer: Self-pay

## 2022-10-27 NOTE — Telephone Encounter (Signed)
Transition Care Management Unsuccessful Follow-up Telephone Call  Date of discharge and from where:  [facility] 10/26/2022  Attempts:  1st Attempt  Reason for unsuccessful TCM follow-up call:  Unable to leave message Juanda Crumble, [place] Direct Dial [phone]

## 2022-10-28 NOTE — Telephone Encounter (Signed)
Transition Care Management Unsuccessful Follow-up Telephone Call  Date of discharge and from where:  [facility]  Attempts:  2nd Attempt  Reason for unsuccessful TCM follow-up call:  Left voice message Juanda Crumble, [place] Direct Dial [phone]

## 2022-10-29 NOTE — Telephone Encounter (Signed)
Transition Care Management Unsuccessful Follow-up Telephone Call  Date of discharge and from where:  [facility] 10/26/2022  Attempts:  3rd Attempt  Reason for unsuccessful TCM follow-up call:  Unable to leave message Juanda Crumble, [place] Direct Dial [phone]

## 2022-11-06 ENCOUNTER — Encounter (HOSPITAL_COMMUNITY): Payer: Self-pay

## 2022-11-06 ENCOUNTER — Other Ambulatory Visit: Payer: Self-pay

## 2022-11-06 ENCOUNTER — Emergency Department (HOSPITAL_COMMUNITY): Payer: Self-pay

## 2022-11-06 ENCOUNTER — Telehealth (HOSPITAL_COMMUNITY): Payer: Self-pay

## 2022-11-06 ENCOUNTER — Ambulatory Visit (HOSPITAL_COMMUNITY)
Admit: 2022-11-06 | Discharge: 2022-11-06 | Disposition: A | Discharge status: Home / Self Care | Payer: Self-pay | Attending: Cardiology | Admitting: Cardiology

## 2022-11-06 ENCOUNTER — Emergency Department (HOSPITAL_COMMUNITY)
Admission: EM | Admit: 2022-11-06 | Discharge: 2022-11-06 | Disposition: A | Discharge status: Home / Self Care | Payer: Self-pay | Attending: Emergency Medicine | Admitting: Emergency Medicine

## 2022-11-06 VITALS — BP 118/78 | HR 60 | Wt 151.0 lb

## 2022-11-06 DIAGNOSIS — E119 Type 2 diabetes mellitus without complications: Secondary | ICD-10-CM

## 2022-11-06 DIAGNOSIS — I252 Old myocardial infarction: Secondary | ICD-10-CM | POA: Insufficient documentation

## 2022-11-06 DIAGNOSIS — Z7984 Long term (current) use of oral hypoglycemic drugs: Secondary | ICD-10-CM | POA: Insufficient documentation

## 2022-11-06 DIAGNOSIS — Z955 Presence of coronary angioplasty implant and graft: Secondary | ICD-10-CM | POA: Insufficient documentation

## 2022-11-06 DIAGNOSIS — Z95810 Presence of automatic (implantable) cardiac defibrillator: Secondary | ICD-10-CM | POA: Insufficient documentation

## 2022-11-06 DIAGNOSIS — Z7982 Long term (current) use of aspirin: Secondary | ICD-10-CM | POA: Insufficient documentation

## 2022-11-06 DIAGNOSIS — Z8674 Personal history of sudden cardiac arrest: Secondary | ICD-10-CM | POA: Insufficient documentation

## 2022-11-06 DIAGNOSIS — E1165 Type 2 diabetes mellitus with hyperglycemia: Secondary | ICD-10-CM | POA: Insufficient documentation

## 2022-11-06 DIAGNOSIS — Z5986 Financial insecurity: Secondary | ICD-10-CM | POA: Insufficient documentation

## 2022-11-06 DIAGNOSIS — E875 Hyperkalemia: Secondary | ICD-10-CM | POA: Insufficient documentation

## 2022-11-06 DIAGNOSIS — Z91141 Patient's other noncompliance with medication regimen due to financial hardship: Secondary | ICD-10-CM | POA: Insufficient documentation

## 2022-11-06 DIAGNOSIS — I5022 Chronic systolic (congestive) heart failure: Secondary | ICD-10-CM | POA: Insufficient documentation

## 2022-11-06 DIAGNOSIS — I255 Ischemic cardiomyopathy: Secondary | ICD-10-CM | POA: Insufficient documentation

## 2022-11-06 DIAGNOSIS — I2510 Atherosclerotic heart disease of native coronary artery without angina pectoris: Secondary | ICD-10-CM | POA: Insufficient documentation

## 2022-11-06 DIAGNOSIS — E785 Hyperlipidemia, unspecified: Secondary | ICD-10-CM

## 2022-11-06 DIAGNOSIS — Z79899 Other long term (current) drug therapy: Secondary | ICD-10-CM | POA: Insufficient documentation

## 2022-11-06 DIAGNOSIS — I340 Nonrheumatic mitral (valve) insufficiency: Secondary | ICD-10-CM | POA: Insufficient documentation

## 2022-11-06 LAB — CBC
HCT: 44.7 % (ref 39.0–52.0)
Hemoglobin: 14.9 g/dL (ref 13.0–17.0)
MCH: 30.3 pg (ref 26.0–34.0)
MCHC: 33.3 g/dL (ref 30.0–36.0)
MCV: 90.9 fL (ref 80.0–100.0)
Platelets: 203 10*3/uL (ref 150–400)
RBC: 4.92 MIL/uL (ref 4.22–5.81)
RDW: 12.7 % (ref 11.5–15.5)
WBC: 11.5 10*3/uL — ABNORMAL HIGH (ref 4.0–10.5)
nRBC: 0 % (ref 0.0–0.2)

## 2022-11-06 LAB — BASIC METABOLIC PANEL
Anion gap: 7 (ref 5–15)
Anion gap: 8 (ref 5–15)
BUN: 45 mg/dL — ABNORMAL HIGH (ref 8–23)
BUN: 49 mg/dL — ABNORMAL HIGH (ref 8–23)
CO2: 23 mmol/L (ref 22–32)
CO2: 20 mmol/L — ABNORMAL LOW (ref 22–32)
Calcium: 9 mg/dL (ref 8.9–10.3)
Calcium: 9.1 mg/dL (ref 8.9–10.3)
Chloride: 105 mmol/L (ref 98–111)
Chloride: 108 mmol/L (ref 98–111)
Creatinine, Ser: 1.3 mg/dL — ABNORMAL HIGH (ref 0.61–1.24)
Creatinine, Ser: 1.64 mg/dL — ABNORMAL HIGH (ref 0.61–1.24)
GFR, Estimated: >60 mL/min (ref >60)
GFR, Estimated: 46 mL/min — ABNORMAL LOW (ref >60)
Glucose, Bld: 133 mg/dL — ABNORMAL HIGH (ref 70–99)
Glucose, Bld: 135 mg/dL — ABNORMAL HIGH (ref 70–99)
Potassium: 5.7 mmol/L — ABNORMAL HIGH (ref 3.5–5.1)
Potassium: 5.4 mmol/L — ABNORMAL HIGH (ref 3.5–5.1)
Sodium: 135 mmol/L (ref 135–145)
Sodium: 136 mmol/L (ref 135–145)

## 2022-11-06 LAB — TROPONIN I (HIGH SENSITIVITY)
Troponin I (High Sensitivity): 26 ng/L — ABNORMAL HIGH (ref <18)
Troponin I (High Sensitivity): 26 ng/L — ABNORMAL HIGH (ref <18)

## 2022-11-06 MED ORDER — ENTRESTO 49-51 MG PO TABS
1.0000 | ORAL_TABLET | Freq: Two times a day (BID) | ORAL | 3 refills | Status: DC
  Filled 2022-11-06: qty 60, 30d supply, fill #0

## 2022-11-06 MED ORDER — ATORVASTATIN CALCIUM 20 MG PO TABS
20.0000 mg | ORAL_TABLET | Freq: Every day | ORAL | 3 refills | Status: DC
  Filled 2022-11-06: qty 90, 90d supply, fill #0

## 2022-11-06 MED ORDER — SODIUM ZIRCONIUM CYCLOSILICATE 5 G PO PACK
5.0000 g | PACK | Freq: Once | ORAL | Status: AC
  Administered 2022-11-06: 5 g via ORAL
  Filled 2022-11-06: qty 1

## 2022-11-06 NOTE — Progress Notes (Signed)
H&V Care Navigation CSW Progress Note  Clinical Social Worker met with pt to assist with getting assistance with medications.  Already having applications for entresto and jardiance filled out but provided [place] [facility] application to help with obtaining his other medications.  CSW provided application and instructions on how to complete- he will plan to bring to his appt with [facility] on Monday and will provide to CSW at that clinic.   SDOH Screenings   Food Insecurity: Food Insecurity Present (10/26/2022)  Housing: Low Risk  (10/26/2022)  Transportation Needs: Unmet Transportation Needs (10/26/2022)  Utilities: Not At Risk (10/26/2022)  Alcohol Screen: Low Risk  (10/26/2022)  Depression (PHQ2-9): Low Risk  (02/09/2022)  Financial Resource Strain: High Risk (10/26/2022)  Tobacco Use: Low Risk  (11/06/2022)   Jorge Ny, LCSW Clinical Social Worker [facility] Desk#: [phone] Cell#: [phone]

## 2022-11-06 NOTE — Discharge Instructions (Addendum)
You were seen in the emergency department today for your high potassium while in the emergency department gave a single dose of a medication called Lokelma please also follow the medication changes below  Stop Entresto and Spironolactone   Take lasix 40mg  2 times daily   Sincerely, Clydie Braun, PGY-2     Usted fue visto en el servicio de urgencias hoy para su potasio alto, mientras que en el servicio de urgencias dio una dosis nica de un medicamento llamado [employer] por favor, siga tambin los cambios de medicacin a continuacin  Deje de tomar Ship broker y Contractor lasix 40mg  2 veces al da   Sincerely, Clydie Braun, PGY-2

## 2022-11-06 NOTE — Patient Instructions (Addendum)
  Cambios de medicacin:  CAMBIO: Entresto. Aumente a 49-51 mg, tome una tableta, 2 veces al [employer]. (Termina tu frasco [employer], dos veces al da de Entresto 24-26mg )  Mat Carne de laboratorio:  Los laboratorios se realizaron hoy, nos comunicaremos con usted si hay lecturas anormales.   Instrucciones especiales // Educacin:  Haga las siguientes cosas TODOS LOS DAS: 1) Psate por la maana antes del desayuno. Escrbalo y gurdelo en un registro. 2) Tome sus medicamentos segn lo prescrito 3) Consuma alimentos bajos en sal: limite la sal (sodio) a 2000 mg por da. 4) Mantente lo ms activo que [employer] 5) Limite todos los lquidos del da a menos de 2 litros.   Seguimiento en: 2 semanas        Medication Changes:  CHANGE: Entresto. Increase to 49-51mg , take one tablet, 2 times a day.  [facility] your bottle by taking two tablets, two times a day of Entresto 24-26mg )  Lab Work:  Labs done today, we will contact you for abnormal readings.   Special Instructions // Education:  Do the following things EVERYDAY: Weigh yourself in the morning before breakfast. Write it down and keep it in a log. Take your medicines as prescribed Eat low salt foods--Limit salt (sodium) to 2000 mg per day.  Stay as active as you can everyday Limit all fluids for the day to less than 2 liters   Follow-Up in: 2 weeks

## 2022-11-06 NOTE — Progress Notes (Signed)
HEART & VASCULAR TRANSITION OF CARE CONSULT NOTE     Referring Physician: Dr. Jarvis Newcomer  Primary Care: Grayce Sessions, NP  Primary Cardiologist: Maisie Fus, MD   HPI: Referred to clinic by Dr. Jarvis Newcomer for heart failure consultation.   67 y.o. spanish speaking male with a history of CAD s/p mid LAD shockwave and PCI, diagonal PCI July 2022, VT arrest s/p ICD, HFrEF (EF 30-35%),  and poorly controlled T2DM (Hgb A1c 9). Received prior cardiac care at [facility] in [place]. Recently transferred care to [facility], had OV w/ Dr. Carolan Clines 11/23. Also plans to transfer EP care locally as well.   Admitted 1/24 w/ a/c CHF. Troponin 152 > 160 without ischemic ECG changes. BNP 729. CXR showed pulmonary edema and the patient was hypoxemic. Pt reported poor compliance w/ meds. Unable to afford due to cost. He was diuresed and GDMT restarted. Echo EF 30-35%, mild-mod MR, RV normal. Discharge home on 1/9, d/w wt 153 lb. Referred to [facility] clinic.   Presents today for f/u. Here w/ interpreter. Reports doing well. Wt stable since d/c at 151 lb.  Denies exertional dyspnea. No chest pain. Denies orthopnea and PND. No LEE. Denies ICD shocks. Compliant w/ meds no side effects. BP 118/78. No orthostatic symptoms.    Cardiac Testing  Echo 1/24 EF 30-35%, mild-mod MR, RV normal      Review of Systems: [y] = yes, [ ]  = no   General: Weight gain [ ] ; Weight loss [ ] ; Anorexia [ ] ; Fatigue [ ] ; Fever [ ] ; Chills [ ] ; Weakness [ ]   Cardiac: Chest pain/pressure [ ] ; Resting SOB [ ] ; Exertional SOB [ ] ; Orthopnea [ ] ; Pedal Edema [ ] ; Palpitations [ ] ; Syncope [ ] ; Presyncope [ ] ; Paroxysmal nocturnal dyspnea[ ]   Pulmonary: Cough [ ] ; Wheezing[ ] ; Hemoptysis[ ] ; Sputum [ ] ; Snoring [ ]   GI: Vomiting[ ] ; Dysphagia[ ] ; Melena[ ] ; Hematochezia [ ] ; Heartburn[ ] ; Abdominal pain [ ] ; Constipation [ ] ; Diarrhea [ ] ; BRBPR [ ]   GU: Hematuria[ ] ; Dysuria [ ] ; Nocturia[ ]   Vascular: Pain in legs with walking [ ] ; Pain in  feet with lying flat [ ] ; Non-healing sores [ ] ; Stroke [ ] ; TIA [ ] ; Slurred speech [ ] ;  Neuro: Headaches[ ] ; Vertigo[ ] ; Seizures[ ] ; Paresthesias[ ] ;Blurred vision [ ] ; Diplopia [ ] ; Vision changes [ ]   Ortho/Skin: Arthritis [ ] ; Joint pain [ ] ; Muscle pain [ ] ; Joint swelling [ ] ; Back Pain [ ] ; Rash [ ]   Psych: Depression[ ] ; Anxiety[ ]   Heme: Bleeding problems [ ] ; Clotting disorders [ ] ; Anemia [ ]   Endocrine: Diabetes [ ] ; Thyroid dysfunction[ ]    Past Medical History:  Diagnosis Date   Cardiac arrest (HCC)    CHF (congestive heart failure) (HCC)    Coronary artery disease    Diabetes mellitus without complication (HCC)    H/O heart artery stent    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    Current Outpatient Medications  Medication Sig Dispense Refill   aspirin EC 81 MG tablet Take 81 mg by mouth daily. Swallow whole.     atorvastatin (LIPITOR) 20 MG tablet Take 1 tablet (20 mg total) by mouth daily. 90 tablet 3   empagliflozin (JARDIANCE) 10 MG TABS tablet Take 1 tablet (10 mg total) by mouth daily before breakfast. 30 tablet 0   furosemide (LASIX) 40 MG tablet Take 1 tablet (  40 mg total) by mouth daily. 30 tablet 0   metFORMIN (GLUCOPHAGE-XR) 500 MG 24 hr tablet Take 1 tablet (500 mg total) by mouth daily with breakfast. 30 tablet 0   metoprolol succinate (TOPROL-XL) 50 MG 24 hr tablet Take 1 tablet (50 mg total) by mouth daily. 30 tablet 0   sacubitril-valsartan (ENTRESTO) 24-26 MG Take 1 tablet by mouth 2 (two) times daily. 60 tablet 0   spironolactone (ALDACTONE) 25 MG tablet Take 1 tablet (25 mg total) by mouth daily. 30 tablet 0   No current facility-administered medications for this visit.    No Known Allergies    Social History   Socioeconomic History   Marital status: Single    Spouse name: Not on file   Number of children: 1   Years of education: Not on file   Highest education level: 5th grade  Occupational History    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  Tobacco Use   Smoking status: Never    Passive exposure: Never   Smokeless tobacco: Never  Vaping Use   Vaping Use: Never used  Substance and Sexual Activity   Alcohol use: Never   Drug use: Never   Sexual activity: Not Currently    Partners: Female  Other Topics Concern   Not on file  Social History Narrative   Not on file   Social Determinants of Health   Financial Resource Strain: High Risk (10/26/2022)   Overall Financial Resource Strain (CARDIA)    Difficulty of Paying Living Expenses: Very hard  Food Insecurity: Food Insecurity Present (10/26/2022)   Hunger Vital Sign    Worried About Charity fundraiser in the Last Year: Often true    Ran Out of Food in the Last Year: Often true  Transportation Needs: Unmet Transportation Needs (10/26/2022)   PRAPARE - Hydrologist (Medical): Yes    Lack of Transportation (Non-Medical): Yes  Physical Activity: Not on file  Stress: Not on file  Social Connections: Not on file  Intimate Partner Violence: Not At Risk (10/26/2022)   Humiliation, Afraid, Rape, and Kick questionnaire    Fear of Current or Ex-Partner: No    Emotionally Abused: No    Physically Abused: No    Sexually Abused: No      Family History  Problem Relation Age of Onset   Diabetes Mother    Diabetes Brother     There were no vitals filed for this visit.  PHYSICAL EXAM: General:  Well appearing. No respiratory difficulty HEENT: normal Neck: supple. no JVD. Carotids 2+ bilat; no bruits. No lymphadenopathy or thryomegaly appreciated. Cor: PMI nondisplaced. Regular rate & rhythm. No rubs, gallops or murmurs. Lungs: clear Abdomen: soft, nontender, nondistended. No hepatosplenomegaly. No bruits or masses. Good bowel sounds. Extremities: no cyanosis, clubbing, rash, edema Neuro: alert & oriented x 3, cranial nerves grossly intact. moves all 4 extremities w/o difficulty. Affect pleasant.  ECG: Not performed     ASSESSMENT & PLAN:  1. Chronic Systolic Heart Failure - ICM, CAD w/ known LAD and diagonal dz, s/p PCI - Has ICD - Echo 1/24 EF 30-35%, RV ok  - NYHA Class I-II. Euvolemic on exam  - Continue GDMT titration  - Increase Entresto to 49-51 mg bid - Continue Jardiance 10 mg daily  - Continue Spironolactone 25 mg daily  - Continue Toprol XL 50 mg daily  - Continue Lasix 40 mg daily   2. CAD - h/o NSTEMI s/p PCI to LAD and diagonal  at [place]  - denies CP  - continue asa, statin + ? blocker  3. H/o VT Arrest - s/p ICD, implanted at [facility]  - has been referred to Dr. Sallyanne Kuster to be followed here   4. Mitral Regurgitation - mild-mod on Echo 1/24 - suspect functional MR   NYHA I-II GDMT  Diuretic- Lasix 40 mg daily  BB- Toprol XL 50 mg daily  Ace/ARB/ARNI Entresto 49-51 mg bid  MRA Spiro 25 mg daily  SGLT2i Jardiance 10 mg     Referred to HFSW (PCP, Medications, Transportation, ETOH Abuse, Drug Abuse, Insurance underwriter, Museum/gallery curator ): Yes  Refer to Pharmacy: Yes  Refer to Home Health: No Refer to Advanced Heart Failure Clinic: No  Refer to General Cardiology: Yes   Follow up  in [facility] clinic in 2 wks to help w/ further med titration. Will f/u w/ Gen cards therafter.

## 2022-11-06 NOTE — Telephone Encounter (Signed)
-----  Message from Consuelo Pandy, [place] sent at 11/06/2022  1:19 PM EST ----- K elevated at 5.7. Advise to go to ED to get K level down and repeat labs.   Will need to stop Entresto and Arlyce Harman   Increase Lasix to 40 mg bid

## 2022-11-06 NOTE — Telephone Encounter (Signed)
Heart Failure Patient Advocate Encounter  Assistance forms for [facility]) and Jardiance (Leamon Palau Schein) started in office. Will route to MD Croitoru to finalize and submit. Patient has appointment on 1/22  Clista Bernhardt, CPhT Rx Patient Advocate Phone: [phone]

## 2022-11-06 NOTE — ED Triage Notes (Signed)
Pt presents with a c/o of elevated potassium that was performed by his PCP. Pt was at his PCP for a routine visit today for management on chronic conditions. Pt denies any symptoms.   Triage performed with interpreter services.

## 2022-11-06 NOTE — ED Provider Notes (Signed)
[facility] Provider Note   CSN: [id number] Arrival date & time: 11/06/22  1432     History  No chief complaint on file.   Pedro Kemp is a 67 y.o. male.  Pedro Kemp is a 67 y.o. male with medical history significant of CAD with h/o cardiac arrest; chronic systolic CHF (EF 38-18%) CAD s/p DES to mid LAD (04/2021), history of VT arrest s/p ICD, and DM presenting with CP/SOB.  Patient presents from his primary care provider's office after incidental finding of hyperkalemia noted to 5.7.  Notably, patient states that he is in his usual state of health denies any recent chest pain, shortness of breath, nausea, vomiting, diarrhea, changes in medications.  He states that he simply told to present to the emergency department given the fact that his potassium is high.  Patient denies any ICD shocks and states that he is in his usual state of health.        Home Medications Prior to Admission medications   Medication Sig Start Date End Date Taking? Authorizing Provider  aspirin EC 81 MG tablet Take 81 mg by mouth daily. Swallow whole.    [provider]  atorvastatin (LIPITOR) 20 MG tablet Take 1 tablet (20 mg total) by mouth daily. 11/06/22   Lyda Jester M, PA-C  empagliflozin (JARDIANCE) 10 MG TABS tablet Take 1 tablet (10 mg total) by mouth daily before breakfast. 10/26/22   Patrecia Pour, MD  furosemide (LASIX) 40 MG tablet Take 1 tablet (40 mg total) by mouth daily. 10/26/22   Patrecia Pour, MD  metFORMIN (GLUCOPHAGE-XR) 500 MG 24 hr tablet Take 1 tablet (500 mg total) by mouth daily with breakfast. 10/26/22   Patrecia Pour, MD  metoprolol succinate (TOPROL-XL) 50 MG 24 hr tablet Take 1 tablet (50 mg total) by mouth daily. 10/26/22   Patrecia Pour, MD  sacubitril-valsartan (ENTRESTO) 49-51 MG Take 1 tablet by mouth 2 (two) times daily. 11/06/22   Consuelo Pandy, PA-C  spironolactone (ALDACTONE) 25 MG tablet Take 1 tablet  (25 mg total) by mouth daily. 10/26/22   Patrecia Pour, MD      Allergies    Patient has no known allergies.    Review of Systems   Review of Systems  Physical Exam Updated Vital Signs BP 138/65 (BP Location: Left Arm)   Pulse (!) 59   Temp (!) 97.5 F (36.4 C) (Oral)   Resp 18   Ht 5' 4.5" (1.638 m)   Wt 67.6 kg   SpO2 100%   BMI 25.18 kg/m  Physical Exam Vitals and nursing note reviewed.  Constitutional:      General: He is not in acute distress.    Appearance: He is well-developed.  HENT:     Head: Normocephalic and atraumatic.  Eyes:     Conjunctiva/sclera: Conjunctivae normal.  Cardiovascular:     Rate and Rhythm: Normal rate and regular rhythm.     Heart sounds: No murmur heard. Pulmonary:     Effort: Pulmonary effort is normal. No respiratory distress.     Breath sounds: Normal breath sounds.  Abdominal:     Palpations: Abdomen is soft.     Tenderness: There is no abdominal tenderness.  Musculoskeletal:        General: No swelling.     Cervical back: Neck supple.  Skin:    General: Skin is warm and dry.     Capillary Refill: Capillary refill  takes less than 2 seconds.  Neurological:     Mental Status: He is alert.  Psychiatric:        Mood and Affect: Mood normal.     ED Results / Procedures / Treatments   Labs (all labs ordered are listed, but only abnormal results are displayed) Labs Reviewed  CBC - Abnormal; Notable for the following components:      Result Value   WBC 11.5 (*)    All other components within normal limits  BASIC METABOLIC PANEL - Abnormal; Notable for the following components:   Potassium 5.4 (*)    CO2 20 (*)    Glucose, Bld 135 (*)    BUN 49 (*)    Creatinine, Ser 1.64 (*)    GFR, Estimated 46 (*)    All other components within normal limits  TROPONIN I (HIGH SENSITIVITY) - Abnormal; Notable for the following components:   Troponin I (High Sensitivity) 26 (*)    All other components within normal limits  TROPONIN I  (HIGH SENSITIVITY) - Abnormal; Notable for the following components:   Troponin I (High Sensitivity) 26 (*)    All other components within normal limits    EKG None  Radiology DG Chest 2 View  Result Date: 11/06/2022 CLINICAL DATA:  Chest pain. EXAM: CHEST - 2 VIEW COMPARISON:  Chest x-ray 10/23/2022. FINDINGS: The heart size and mediastinal contours are within normal limits. Both lungs are clear. No visible pleural effusions or pneumothorax. No acute osseous abnormality. Left subclavian approach cardiac rhythm maintenance device. IMPRESSION: No active cardiopulmonary disease. Electronically Signed   By: Margaretha Sheffield M.D.   On: 11/06/2022 17:12    Procedures Procedures    Medications Ordered in ED Medications  sodium zirconium cyclosilicate (LOKELMA) packet 5 g (5 g Oral Given 11/06/22 2134)    ED Course/ Medical Decision Making/ A&P                             Medical Decision Making Pedro Kemp is a 67 year old male with extensive past medical history presenting to the emergency department after incidental lab finding of hyperkalemia.  On arrival to the emergency department patient's potassium 5.4 CBC grossly within normal limits slight elevation white blood cells initial troponin  26 although patient has a chronic troponinemia this appears to be his baseline follow-up troponin also resulted at 26.  On review of patient's EKG there seems to be no acute changes from patient's baseline EKG.  Most likely etiology of patient's hyperkalemia secondary to medications including spironolactone and Entresto.  Given the fact that the patient is well-appearing and asymptomatic and is tolerating p.o. intake without any difficulty and this is an incidental finding abilities suitable for home discharge at this time plan is to have patient hold Entresto as well as spironolactone this plan was passed on to the patient patient will follow-up with his primary care provider in the outpatient  setting for further management.  To discharge patient was given a single 5 g dose of Lokelma for further potassium temporization we opted not to give the patient calcium gluconate as his EKG appears to be at baseline.  Patient was discharged home self-care without incident     Amount and/or Complexity of Data Reviewed Labs: ordered. Radiology: ordered.  Risk Prescription drug management.           Final Clinical Impression(s) / ED Diagnoses Final diagnoses:  Hyperkalemia    Rx /  DC Orders ED Discharge Orders     None         Alphia Kava, MD 11/07/22 7253    Jacalyn Lefevre, MD 11/07/22 [phone]

## 2022-11-06 NOTE — ED Notes (Signed)
AVS reviewed with pt prior to discharge. Pt denies any questions. Belongings with pt upon depart. Ambulatory to POV by self.

## 2022-11-06 NOTE — Telephone Encounter (Signed)
alled pt with assistance of [place] as [place] speaker. Pt aware, agreeable, and verbalized understanding---plans to go to ED for potassium. Aware to stop entresto and spirolonlactone. Confirmed with pt to head to ED today.

## 2022-11-06 NOTE — ED Provider Triage Note (Signed)
Emergency Medicine Provider Triage Evaluation Note  Temitayo Covalt , a 67 y.o. male  was evaluated in triage.  Pt complains of abnormal labs. Had check up with cardiologist for heart failure and had labs done today. They called him to tell him his potassium was elevatred to 5.7 and he was to go to the ER, recommended stopping entresto and spironolactone, increasing lasix.   Review of Systems  Positive: N/a Negative: Chest pain, SOB, dizziness  Physical Exam  BP (!) 140/64 (BP Location: Left Arm)   Pulse (!) 59   Temp 97.7 F (36.5 C) (Oral)   Resp 16   Ht 5' 4.5" (1.638 m)   Wt 67.6 kg   SpO2 99%   BMI 25.18 kg/m  Gen:   Awake, no distress   Resp:  Normal effort  MSK:   Moves extremities without difficulty  Other:    Medical Decision Making  Medically screening exam initiated at 4:34 PM.  Appropriate orders placed.  Willow [name] was informed that the remainder of the evaluation will be completed by another provider, this initial triage assessment does not replace that evaluation, and the importance of remaining in the ED until their evaluation is complete.     Kateri Plummer, PA-C 11/06/22 1634

## 2022-11-09 ENCOUNTER — Ambulatory Visit: Payer: Self-pay | Attending: Cardiovascular Disease | Admitting: Cardiovascular Disease

## 2022-11-09 ENCOUNTER — Encounter: Payer: Self-pay | Admitting: Cardiovascular Disease

## 2022-11-09 ENCOUNTER — Other Ambulatory Visit: Payer: Self-pay

## 2022-11-09 ENCOUNTER — Telehealth: Payer: Self-pay | Admitting: Licensed Clinical Social Worker

## 2022-11-09 VITALS — BP 138/78 | HR 86 | Ht 62.99 in | Wt 148.0 lb

## 2022-11-09 DIAGNOSIS — I5042 Chronic combined systolic (congestive) and diastolic (congestive) heart failure: Secondary | ICD-10-CM

## 2022-11-09 DIAGNOSIS — I472 Ventricular tachycardia, unspecified: Secondary | ICD-10-CM

## 2022-11-09 DIAGNOSIS — I2510 Atherosclerotic heart disease of native coronary artery without angina pectoris: Secondary | ICD-10-CM

## 2022-11-09 DIAGNOSIS — N1831 Chronic kidney disease, stage 3a: Secondary | ICD-10-CM

## 2022-11-09 DIAGNOSIS — N2589 Other disorders resulting from impaired renal tubular function: Secondary | ICD-10-CM

## 2022-11-09 DIAGNOSIS — I509 Heart failure, unspecified: Secondary | ICD-10-CM

## 2022-11-09 DIAGNOSIS — I10 Essential (primary) hypertension: Secondary | ICD-10-CM

## 2022-11-09 DIAGNOSIS — Z95810 Presence of automatic (implantable) cardiac defibrillator: Secondary | ICD-10-CM

## 2022-11-09 DIAGNOSIS — I5023 Acute on chronic systolic (congestive) heart failure: Secondary | ICD-10-CM

## 2022-11-09 DIAGNOSIS — E875 Hyperkalemia: Secondary | ICD-10-CM

## 2022-11-09 DIAGNOSIS — I480 Paroxysmal atrial fibrillation: Secondary | ICD-10-CM

## 2022-11-09 LAB — BASIC METABOLIC PANEL
BUN/Creatinine Ratio: 30 — ABNORMAL HIGH (ref 10–24)
BUN: 43 mg/dL — ABNORMAL HIGH (ref 8–27)
CO2: 20 mmol/L (ref 20–29)
Calcium: 9.8 mg/dL (ref 8.6–10.2)
Chloride: 99 mmol/L (ref 96–106)
Creatinine, Ser: 1.43 mg/dL — ABNORMAL HIGH (ref 0.76–1.27)
Glucose: 213 mg/dL — ABNORMAL HIGH (ref 70–99)
Potassium: 5.7 mmol/L — ABNORMAL HIGH (ref 3.5–5.2)
Sodium: 135 mmol/L (ref 134–144)
eGFR: 54 mL/min/{1.73_m2} — ABNORMAL LOW (ref >59)

## 2022-11-09 MED ORDER — FUROSEMIDE 40 MG PO TABS: 40.0000 mg | ORAL_TABLET | Freq: Every day | ORAL | 0 refills | Status: DC

## 2022-11-09 MED ORDER — APIXABAN 5 MG PO TABS
5.0000 mg | ORAL_TABLET | Freq: Two times a day (BID) | ORAL | 3 refills | Status: DC
  Filled 2022-11-09: qty 60, 30d supply, fill #0

## 2022-11-09 NOTE — Telephone Encounter (Signed)
H&V Care Navigation CSW Progress Note  Clinical Social Worker  received paperwork  to submit for [name] Unfortunately only received letter of support, missing actual application. I will reach out to pt and let him know- if unable to reach will coordinate again with team at HF clinic. LCSW also noted that pt paperwork for BMS PAP for Eliquis was left on my desk. Gave this back to RN team to complete with provider portion and submit. Pt also had [employer] and [employer] applications noted on previous encounter w/ HF clinic. Per Dr. Lurline Del team they have not received them- I have sent secure message to pharm team regarding this.   Patient is participating in a Managed Medicaid Plan:  No, self pay, uses CAFA 100%  [place]: Food Insecurity Present (10/26/2022)  Housing: Low Risk  (10/26/2022)  Transportation Needs: Unmet Transportation Needs (10/26/2022)  Utilities: Not At Risk (10/26/2022)  Alcohol Screen: Low Risk  (10/26/2022)  Depression (PHQ2-9): Low Risk  (02/09/2022)  Financial Resource Strain: High Risk (10/26/2022)  Tobacco Use: Low Risk  (11/09/2022)   Westley Hummer, MSW, LCSW Clinical Social Worker II [place]  [phone]- work cell phone (preferred) [phone]- desk phone

## 2022-11-09 NOTE — Patient Instructions (Signed)
Medication Instructions:  Eliquis 5mg  twice daily *If you need a refill on your cardiac medications before your next appointment, please call your pharmacy*   Follow-Up: At [facility], you and your health needs are our priority.  As part of our continuing mission to provide you with exceptional heart care, we have created designated Provider Care Teams.  These Care Teams include your primary Cardiologist (physician) and Advanced Practice Providers (APPs -  Physician Assistants and Nurse Practitioners) who all work together to provide you with the care you need, when you need it.  We recommend signing up for the patient portal called "MyChart".  Sign up information is provided on this After Visit Summary.  MyChart is used to connect with patients for Virtual Visits (Telemedicine).  Patients are able to view lab/test results, encounter notes, upcoming appointments, etc.  Non-urgent messages can be sent to your provider as well.   To learn more about what you can do with MyChart, go to [email or web address].    Your next appointment:   1 year(s)  Provider:   Janina Mayo, MD     Other Instructions Plug in the device transmitter.

## 2022-11-09 NOTE — Progress Notes (Unsigned)
Cardiology Office Note:    Date:  11/12/2022   ID:  Pedro Kemp, DOB [birth year], MRN [id number]  PCP:  Grayce Sessions, NP   [facility] Providers Cardiologist:  Maisie Fus, MD     Referring MD: Grayce Sessions, NP   Chief Complaint  Patient presents with   ICD check    History of Present Illness:    Pedro Kemp is a 67 y.o. 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  He has not been performing remote ICD transmissions.  A Spanish translator and his family (wife and daughter) were present during the visit.  He was recently discharged from the hospital (January 05-January 08) where he had a heart failure admission.  He had not been taking medications for about a month, due to the cost.  Interrogation of his defibrillator today actually shows that his fluid overload had began much earlier around December 24.  On admission he had pulmonary edema and hypoxemia and the BNP was 729.  He responded well to intravenous diuretics and at discharge his "euvolemic weight" was estimated to be 60 to 69 kg.  Note was made during that admission that he probably has CKD stage IIIa.  Discharge medications included Jardiance 10 mg daily, furosemide 40 mg daily, metoprolol succinate 50 mg daily, Entresto 24-26 mg daily and spironolactone 25 mg daily.  An application for assistance with the expensive medications was started in the hospital, but was not submitted in its entirety due to the absence of income related documents from the family.  During his hospitalization echocardiogram showed a dilated left ventricle (end-diastolic diameter 6.2 cm) with moderate to severely reduced systolic  function (EF 30-35%) and mild to moderate mitral insufficiency.  He was sent back to the emergency room on January 19 by his PCP for hyperkalemia.  His potassium was 5.7.  Spironolactone was stopped.  He has been doing reasonably well since discharge.  He does not have orthopnea, PND or lower extremity edema.  His weight remains lower than it was at hospital discharge at 67 kg (147 pounds).  He describes NYHA functional class II exertional dyspnea.  He has not had chest pain.  He has not had syncope, dizziness or palpitations.  Device interrogation shows normal function of his defibrillator which is still essentially beginning of life with estimated longevity of 11 years.  He has 29.5% atrial pacing and does not require ventricular pacing.  As mentioned his OptiVol showed evidence of fluid overload starting on December 24.  OptiVol is now at baseline.  He has had some atrial fibrillation but very low prevalence at 0.1% (a total of 14 hours of atrial fibrillation in the last year.  He had a 51-minute episode of atrial fibrillation on September 30, 2022 and a almost 6-hour episode of atrial fibrillation about a year ago).  Past Medical History:  Diagnosis Date   Cardiac arrest [facility])    CHF (congestive heart failure) (HCC)    Coronary artery disease    Diabetes mellitus without complication (HCC)    H/O heart artery stent    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    Past Surgical History:  Procedure Laterality Date   CARDIAC CATHETERIZATION  ICD IMPLANT      Current Medications: Current Meds  Medication Sig   apixaban (ELIQUIS) 5 MG TABS tablet Take 1 tablet (5 mg total) by mouth 2 (two) times daily.   aspirin EC 81 MG tablet Take 81 mg by mouth daily. Swallow whole.   atorvastatin (LIPITOR) 20 MG tablet Take 1 tablet (20 mg total) by mouth daily.   metoprolol succinate (TOPROL-XL) 50 MG 24 hr tablet Take 1 tablet (50 mg total) by mouth daily.    sacubitril-valsartan (ENTRESTO) 49-51 MG Take 1 tablet by mouth 2 (two) times daily.   [DISCONTINUED] empagliflozin (JARDIANCE) 10 MG TABS tablet Take 1 tablet (10 mg total) by mouth daily before breakfast.   [DISCONTINUED] furosemide (LASIX) 40 MG tablet Take 1 tablet (40 mg total) by mouth daily.   [DISCONTINUED] metFORMIN (GLUCOPHAGE-XR) 500 MG 24 hr tablet Take 1 tablet (500 mg total) by mouth daily with breakfast.   [DISCONTINUED] spironolactone (ALDACTONE) 25 MG tablet Take 1 tablet (25 mg total) by mouth daily.     Allergies:   Patient has no known allergies.   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  Tobacco Use   Smoking status: Never    Passive exposure: Never   Smokeless tobacco: Never  Vaping Use   Vaping Use: Never used  Substance and Sexual Activity   Alcohol use: Never   Drug use: Never   Sexual activity: Not Currently    Partners: Female  Other Topics Concern   Not on file  Social History Narrative   Not on file   Social Determinants of Health   Financial Resource Strain: High Risk (10/26/2022)   Overall Financial Resource Strain (CARDIA)    Difficulty of Paying Living Expenses: Very hard  Food Insecurity: Food Insecurity Present (10/26/2022)   Hunger Vital Sign    Worried About Running Out of Food in the Last Year: Often true    Ran Out of Food in the Last Year: Often true  Transportation Needs: Unmet Transportation Needs (10/26/2022)   PRAPARE - Administrator, Civil Service (Medical): Yes    Lack of Transportation (Non-Medical): Yes  Physical Activity: Not on file  Stress: Not on file  Social Connections: Not on file     Family History: The patient's family history includes Diabetes in his brother and mother.  ROS:   Please see the history of  present illness.     All other systems reviewed and are negative.  EKGs/Labs/Other Studies Reviewed:    The following studies were reviewed today: Records from hospitalization at [facility] including labs and imaging studies  Echocardiogram 10/24/2022    1. Left ventricular ejection fraction, by estimation, is 30 to 35%. The  left ventricle has moderately decreased function. The left ventricle  demonstrates global hypokinesis. The left ventricular internal cavity size  was moderately to severely dilated.  Left ventricular diastolic parameters are indeterminate.   2. Right ventricular systolic function is normal. The right ventricular  size is normal. There is normal pulmonary artery systolic pressure.   3. Left atrial size was moderately dilated.   4. The mitral valve is normal in structure. Mild to moderate mitral valve  regurgitation. No evidence of mitral stenosis.   5. The aortic valve is  tricuspid. Aortic valve regurgitation is not  visualized. No aortic stenosis is present.   Comparison(s): Prior images reviewed side by side.   Conclusion(s)/Recommendation(s): EF remains reduced, with severe dilation  of LV. MR better appreciated on current study.    EKG:  EKG is  ordered today.  The ekg ordered today demonstrates atrial paced, ventricular sensed rhythm, delayed R wave progression and nonspecific IVCD (QRS 114 ms), persistent ST segment elevation in leads V1-V3, downsloping ST segment depression and T wave inversion V4-V6, I, II, aVL.  Normal QTc 414 ms  Recent Labs: 01/30/2022: Magnesium 1.9 10/23/2022: ALT 26; B Natriuretic Peptide 729.5 11/06/2022: Hemoglobin 14.9; Platelets 203 11/11/2022: BUN 36; Creatinine, Ser 1.66; Potassium 5.3; Sodium 142  Recent Lipid Panel    Component Value Date/Time   CHOL 99 (L) 01/07/2022 1203   TRIG 236 (H) 01/07/2022 1203   HDL 31 (L) 01/07/2022 1203   CHOLHDL 3.2 01/07/2022 1203   LDLCALC 31 01/07/2022 1203     Risk  Assessment/Calculations:                Physical Exam:    VS:  BP 138/78   Pulse 86   Ht 5' 2.99" (1.6 m)   Wt 148 lb (67.1 kg)   SpO2 97%   BMI 26.22 kg/m     Wt Readings from Last 3 Encounters:  11/11/22 150 lb 9.6 oz (68.3 kg)  11/09/22 148 lb (67.1 kg)  11/06/22 149 lb (67.6 kg)     GEN: Relatively lean, well nourished, well developed in no acute distress.  Healthy ICD site in the left subclavian area HEENT: Normal NECK: No JVD; No carotid bruits LYMPHATICS: No lymphadenopathy CARDIAC: RRR, faint holosystolic murmur at the left lower sternal base and apex, no diastolic murmurs, rubs, gallops RESPIRATORY:  Clear to auscultation without rales, wheezing or rhonchi  ABDOMEN: Soft, non-tender, non-distended MUSCULOSKELETAL:  No edema; No deformity  SKIN: Warm and dry NEUROLOGIC:  Alert and oriented x 3 PSYCHIATRIC:  Normal affect   ASSESSMENT:    1. Chronic combined systolic (congestive) and diastolic (congestive) heart failure ([place])   2. Coronary artery disease involving native coronary artery of native heart without angina pectoris   3. ICD (implantable cardioverter-defibrillator) in place   4. Ventricular tachycardia (HCC)   5. Paroxysmal atrial fibrillation (HCC)   6. Stage 3a chronic kidney disease ([place])   7. RTA (renal tubular acidosis)   8. Hyperkalemia   9. Essential hypertension    PLAN:    In order of problems listed above:  CHF: Recent episode of acute decompensation after he had been off all his medications for a month.  Today appears clinically euvolemic, NYHA functional class II.  He has started treatment with beta-blockers, Entresto, Jardiance but spironolactone has been stopped due to hyperkalemia.  Will recheck labs today but I do not think he will be able to take spironolactone long-term due to renal dysfunction.  We did not adjust any of his heart failure medications today.  Will recheck labs and make sure that he has axis to his medications.  Will  complete the patient assistance documents for Jardiance and Entresto. CAD: History of extensive anterior infarction.  EKG suggests anterior aneurysm formation.  He does not have angina pectoris.  More than 12 months have passed since his acute coronary event.  He is on aspirin, high-dose statin, beta-blockers. ICD: Normal device function.  He has a transmitter, but has not plugged this in.  Will reach out to our  device clinic and possibly to the manufacturer to make sure that we can receive his transmissions via our device clinic.  Told him to keep the transmitter plugged in at all times.  It may be useful to enroll him in the heart failure device clinic. VT: None has been recorded recently.  He had cardiac arrest due to sustained monomorphic VT during his hospitalization in July 2022.  The arrhythmia was inducible at EP study. AFib: Newly recognized.  Has had numerous episodes of atrial fibrillation over the last year, including 1 episode lasted for 6 hours and a recent episode in December that lasted for about 1 hour.  These episodes appear to be asymptomatic.  They were not clearly associated with heart failure exacerbation.  Discussed the risks and benefits of anticoagulation in the setting of paroxysmal atrial fibrillation.  CHA2DS2-VASc score is at least 4 (CHF, CAD, HTN, DM).  Will start Eliquis 5 mg twice daily.  Will need to apply for patient assistance for this medication as well.  When he starts Eliquis, will stop aspirin. CKD 3: Suspect this is related to diabetes.  Unclear to what degree it is related to poor cardiac output.  Baseline creatinine seems to be around 1.3, corresponding to a GFR of 55-60.  She will benefit from SGLT2 inhibitors and Entresto to delay the progression of kidney disease in the setting of diabetes. 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  I do not think he will be able to take  spironolactone. HTN: Prefer to treat this with Entresto, carvedilol.      He has a heart failure transition of care visit scheduled on February 2 and a visit with Dr. Carolan Clines on February 28.  At these visits will have to reevaluate his potassium level, renal function and make sure that he has access to [place], [place], Eliquis via the patient assistance programs.   Medication Adjustments/Labs and Tests Ordered: Current medicines are reviewed at length with the patient today.  Concerns regarding medicines are outlined above.  Orders Placed This Encounter  Procedures   Basic Metabolic Panel (BMET)   Meds ordered this encounter  Medications   furosemide (LASIX) 40 MG tablet    Sig: Take 1 tablet (40 mg total) by mouth daily.    Dispense:  30 tablet    Refill:  0   apixaban (ELIQUIS) 5 MG TABS tablet    Sig: Take 1 tablet (5 mg total) by mouth 2 (two) times daily.    Dispense:  180 tablet    Refill:  3    Patient Instructions  Medication Instructions:  Eliquis 5mg  twice daily *If you need a refill on your cardiac medications before your next appointment, please call your pharmacy*   Follow-Up: At [facility], you and your health needs are our priority.  As part of our continuing mission to provide you with exceptional heart care, we have created designated Provider Care Teams.  These Care Teams include your primary Cardiologist (physician) and Advanced Practice Providers (APPs -  Physician Assistants and Nurse Practitioners) who all work together to provide you with the care you need, when you need it.  We recommend signing up for the patient portal called "MyChart".  Sign up information is provided on this After Visit Summary.  MyChart is used to connect with patients for Virtual Visits (Telemedicine).  Patients are able to view lab/test results, encounter notes, upcoming appointments, etc.  Non-urgent messages can be sent to your provider as well.   To learn more  about what you can do with MyChart, go to [email or web address].    Your next appointment:   1 year(s)  Provider:   Janina Mayo, MD     Other Instructions Plug in the device transmitter.     Signed, Sanda Klein, MD  11/12/2022 8:14 PM    [place]

## 2022-11-11 ENCOUNTER — Ambulatory Visit (INDEPENDENT_AMBULATORY_CARE_PROVIDER_SITE_OTHER): Payer: Self-pay | Admitting: Primary Care

## 2022-11-11 ENCOUNTER — Telehealth: Payer: Self-pay | Admitting: Licensed Clinical Social Worker

## 2022-11-11 ENCOUNTER — Encounter (INDEPENDENT_AMBULATORY_CARE_PROVIDER_SITE_OTHER): Payer: Self-pay | Admitting: Primary Care

## 2022-11-11 ENCOUNTER — Other Ambulatory Visit: Payer: Self-pay

## 2022-11-11 ENCOUNTER — Telehealth: Payer: Self-pay | Admitting: *Deleted

## 2022-11-11 VITALS — BP 144/78 | HR 70 | Resp 16 | Ht 63.5 in | Wt 150.6 lb

## 2022-11-11 DIAGNOSIS — Z09 Encounter for follow-up examination after completed treatment for conditions other than malignant neoplasm: Secondary | ICD-10-CM

## 2022-11-11 DIAGNOSIS — E119 Type 2 diabetes mellitus without complications: Secondary | ICD-10-CM

## 2022-11-11 DIAGNOSIS — E875 Hyperkalemia: Secondary | ICD-10-CM

## 2022-11-11 DIAGNOSIS — I509 Heart failure, unspecified: Secondary | ICD-10-CM

## 2022-11-11 LAB — POCT GLYCOSYLATED HEMOGLOBIN (HGB A1C): HbA1c, POC (controlled diabetic range): 7.7 % — AB (ref 0.0–7.0)

## 2022-11-11 MED ORDER — EMPAGLIFLOZIN 10 MG PO TABS
10.0000 mg | ORAL_TABLET | Freq: Every day | ORAL | 1 refills | Status: DC
  Filled 2022-11-11: qty 90, 90d supply, fill #0

## 2022-11-11 MED ORDER — METFORMIN HCL ER 500 MG PO TB24
500.0000 mg | ORAL_TABLET | Freq: Two times a day (BID) | ORAL | 1 refills | Status: DC
  Filled 2022-11-11 – 2022-11-27 (×2): qty 180, 90d supply, fill #0
  Filled 2023-04-09: qty 180, 90d supply, fill #1

## 2022-11-11 NOTE — Telephone Encounter (Signed)
H&V Care Navigation CSW Progress Note  Clinical Social Worker contacted caregiver by phone (pt sister Bonne Dolores on file) to f/u on missing paperwork. I was able to reach her this morning with the assistance of [employer] [phone]. I re-introduced self, role, reason for call. I inquired if pt had the rest of the Spanish language NCMedAssist application provided by my colleague Eliezer Lofts during appt 1/19. [name] shares that they just brought in the one sheet, she is open to me mailing the rest of the paperwork to her home address to complete and bring to appt on 2/2 w/ TOC clinic, I will f/u before that appt to remind them to do so. No additional questions at this time.   Patient is participating in a Managed Medicaid Plan:  No, self pay, CAFA in need of renewal after 12/05/22.   SDOH Screenings   Food Insecurity: Food Insecurity Present (10/26/2022)  Housing: Low Risk  (10/26/2022)  Transportation Needs: Unmet Transportation Needs (10/26/2022)  Utilities: Not At Risk (10/26/2022)  Alcohol Screen: Low Risk  (10/26/2022)  Depression (PHQ2-9): Low Risk  (02/09/2022)  Financial Resource Strain: High Risk (10/26/2022)  Tobacco Use: Low Risk  (11/09/2022)   Westley Hummer, MSW, LCSW Clinical Social Worker [place]  [phone]- work cell phone (preferred) [phone]- desk phone

## 2022-11-11 NOTE — Progress Notes (Signed)
[facility]   Subjective:   Pedro Kemp is a 67 y.o. Hispanic male(Pedro Kemp [phone])  presents for ED follow up and management of Diabetes. He presented on  11/06/22, stated PCP found his potassium was elevated and to go to ED. Writer had not seen pt . Patient was tx with 5 g dose of Lokelma and  discharged. He voices no complaints or problems. Patient has No headache, No chest pain, No abdominal pain - No Nausea, No new weakness tingling or numbness, No Cough - shortness of breath , No polyuria , No polydipsia, No polyphagia or vision changes.  Past Medical History:  Diagnosis Date   Cardiac arrest [facility])    CHF (congestive heart failure) (HCC)    Coronary artery disease    Diabetes mellitus without complication ([place])    H/O heart artery stent    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     No Known Allergies    Current Outpatient Medications on File Prior to Visit  Medication Sig Dispense Refill   apixaban (ELIQUIS) 5 MG TABS tablet Take 1 tablet (5 mg total) by mouth 2 (two) times daily. 180 tablet 3   aspirin EC 81 MG tablet Take 81 mg by mouth daily. Swallow whole.     atorvastatin (LIPITOR) 20 MG tablet Take 1 tablet (20 mg total) by mouth daily. 60 tablet 3   empagliflozin (JARDIANCE) 10 MG TABS tablet Take 1 tablet (10 mg total) by mouth daily before breakfast. 30 tablet 0   furosemide (LASIX) 40 MG tablet Take 1 tablet (40 mg total) by mouth daily. 30 tablet 0   metFORMIN (GLUCOPHAGE-XR) 500 MG 24 hr tablet Take 1 tablet (500 mg total) by mouth daily with breakfast. 30 tablet 0   metoprolol succinate (TOPROL-XL) 50 MG 24 hr tablet Take 1 tablet (50 mg total) by mouth daily. 30 tablet 0   sacubitril-valsartan (ENTRESTO) 49-51 MG Take 1 tablet by mouth 2 (two) times daily. 60 tablet 3   No current facility-administered medications on file prior to visit.     Review of System: Comprehensive ROS Pertinent positive and negative  noted in HPI    Objective:  Blood Pressure (Abnormal) 144/78   Pulse 70   Respiration 16   Height 5' 3.5" (1.613 m)   Weight 150 lb 9.6 oz (68.3 kg)   Oxygen Saturation 97%   Body Mass Index 26.26 kg/m   Physical Exam: General Appearance: Well nourished, over weight  in no apparent distress. Eyes: PERRLA, EOMs, conjunctiva no swelling or erythema Sinuses: No Frontal/maxillary tenderness ENT/Mouth: Ext aud canals clear, TMs without erythema, bulging. No erythema, swelling, or exudate on post pharynx.  Tonsils not swollen or erythematous. Hearing normal.  Neck: Supple, thyroid normal.  Respiratory: Respiratory effort normal, BS equal bilaterally without rales, rhonchi, wheezing or stridor.  Cardio: RRR with no MRGs. Brisk peripheral pulses without edema.  Abdomen: Soft, + BS.  Non tender, no guarding, rebound, hernias, masses. Lymphatics: Non tender without lymphadenopathy.  Musculoskeletal: Full ROM, 5/5 strength, normal gait.  Skin: Warm, dry without rashes, lesions, ecchymosis.  Neuro: Cranial nerves intact. Normal muscle tone, no cerebellar symptoms. Sensation intact.  Psych: Awake and oriented X 3, normal affect, Insight and Judgment appropriate.    Assessment:  [name] was seen today for hospitalization follow-up.  Diagnoses and all orders for this visit:  Type 2 diabetes mellitus without complication, without long-term current use of insulin ([place]) - educated on lifestyle  modifications, including but not limited to diet choices and adding exercise to daily routine.   -     POCT glycosylated hemoglobin (Hb A1C) -     Microalbumin / creatinine urine ratio -     empagliflozin (JARDIANCE) 10 MG TABS tablet; Take 1 tablet (10 mg total) by mouth daily before breakfast. -     metFORMIN (GLUCOPHAGE-XR) 500 MG 24 hr tablet; Take 1 tablet (500 mg total) by mouth 2 (two) times daily with a meal.  Congestive heart failure, unspecified HF chronicity, unspecified heart failure type  ([place]) Followed by cardiology  -     empagliflozin (JARDIANCE) 10 MG TABS tablet; Take 1 tablet (10 mg total) by mouth daily before breakfast.  Hyperkalemia Tx in ED  5 g dose of [facility] discharge follow-up Repeat BMP   This note has been created with Surveyor, quantity. Any transcriptional errors are unintentional.   [name], NP 11/11/2022, 2:28 PM

## 2022-11-11 NOTE — Telephone Encounter (Signed)
Jardiance patient assistance faxed to [employer]. 

## 2022-11-11 NOTE — Telephone Encounter (Signed)
Entresto patient assistance faxed to [employer] 

## 2022-11-12 ENCOUNTER — Other Ambulatory Visit: Payer: Self-pay

## 2022-11-12 ENCOUNTER — Encounter: Payer: Self-pay | Admitting: Cardiovascular Disease

## 2022-11-12 DIAGNOSIS — N1831 Chronic kidney disease, stage 3a: Secondary | ICD-10-CM | POA: Insufficient documentation

## 2022-11-12 DIAGNOSIS — I480 Paroxysmal atrial fibrillation: Secondary | ICD-10-CM | POA: Insufficient documentation

## 2022-11-12 DIAGNOSIS — I472 Ventricular tachycardia, unspecified: Secondary | ICD-10-CM | POA: Insufficient documentation

## 2022-11-12 DIAGNOSIS — N2589 Other disorders resulting from impaired renal tubular function: Secondary | ICD-10-CM | POA: Insufficient documentation

## 2022-11-12 DIAGNOSIS — I4729 Other ventricular tachycardia: Secondary | ICD-10-CM | POA: Insufficient documentation

## 2022-11-12 DIAGNOSIS — Z95810 Presence of automatic (implantable) cardiac defibrillator: Secondary | ICD-10-CM | POA: Insufficient documentation

## 2022-11-12 LAB — MICROALBUMIN / CREATININE URINE RATIO
Creatinine, Urine: 78.8 mg/dL
Microalb/Creat Ratio: 113 mg/g creat — ABNORMAL HIGH (ref 0–29)
Microalbumin, Urine: 89.4 ug/mL

## 2022-11-12 LAB — BASIC METABOLIC PANEL
BUN/Creatinine Ratio: 22 (ref 10–24)
BUN: 36 mg/dL — ABNORMAL HIGH (ref 8–27)
CO2: 22 mmol/L (ref 20–29)
Calcium: 9.1 mg/dL (ref 8.6–10.2)
Chloride: 104 mmol/L (ref 96–106)
Creatinine, Ser: 1.66 mg/dL — ABNORMAL HIGH (ref 0.76–1.27)
Glucose: 112 mg/dL — ABNORMAL HIGH (ref 70–99)
Potassium: 5.3 mmol/L — ABNORMAL HIGH (ref 3.5–5.2)
Sodium: 142 mmol/L (ref 134–144)
eGFR: 45 mL/min/{1.73_m2} — ABNORMAL LOW (ref >59)

## 2022-11-18 ENCOUNTER — Telehealth: Payer: Self-pay | Admitting: Licensed Clinical Social Worker

## 2022-11-18 NOTE — Telephone Encounter (Signed)
H&V Care Navigation CSW Progress Note  Clinical Social Worker contacted caregiver by phone to f/u on assistance application for NCMedAssist. Pt sister shares that they have received that application. They will answer what they can and bring to upcoming appt at [facility] center. I will route this to [place], LCSW, at clinic should additional questions arise with completion at that time.   Patient is participating in a Managed Medicaid Plan:  No, self pay only. CAFA valid through 12/05/22   SDOH Screenings   Food Insecurity: Food Insecurity Present (10/26/2022)  Housing: Low Risk  (10/26/2022)  Transportation Needs: Unmet Transportation Needs (10/26/2022)  Utilities: Not At Risk (10/26/2022)  Alcohol Screen: Low Risk  (10/26/2022)  Depression (PHQ2-9): Low Risk  (11/11/2022)  Financial Resource Strain: High Risk (10/26/2022)  Tobacco Use: Low Risk  (11/12/2022)   Pedro Kemp, MSW, [place]  [phone]- work cell phone (preferred) [phone]- desk phone

## 2022-11-19 ENCOUNTER — Telehealth (HOSPITAL_COMMUNITY): Payer: Self-pay

## 2022-11-19 ENCOUNTER — Telehealth: Payer: Self-pay | Admitting: Licensed Clinical Social Worker

## 2022-11-19 NOTE — Telephone Encounter (Signed)
Called to confirm Heart & Vascular Transitions of Care appointment at 11/20/22. Patient reminded to bring all medications and pill box organizer with them. Confirmed patient has transportation. Gave directions, instructed to utilize [place] parking.  Confirmed appointment using spanish interpreter prior to ending call.

## 2022-11-19 NOTE — Telephone Encounter (Signed)
H&V Care Navigation CSW Progress Note  Clinical Social Worker  contacted various PAP  to f/u on medication assistance. [place] office had submitted assistance for Jardiance ([employer]), Entresto [employer]), Eliquis ([employer]). Updates are as following:  Eliquis- pt approved 11/12/22-11/11/24, they have been trying to reach pt to confirm address to start mailing out information to him, their number is [phone] ([place])  Delene Loll- pt application pending return of attestation letter so they can finish processing application. It was mailed to [facility] address)  Vania Rea- pt would need a letter of zero income signed by provider and sent to [place] [facility], if cannot be done during [facility] appt I can complete with provider at [facility].   Pt also needs to complete NCMedAssist assessment, I have letter of zero income but missing the rest of the application for many of patients other medications to be sent to him. I will route this to LCSW at HF clinic to f/u as able.    Patient is participating in a Managed Medicaid Plan:  No, self pay only. Has CAFA through 2/17.   SDOH Screenings   Food Insecurity: Food Insecurity Present (10/26/2022)  Housing: Low Risk  (10/26/2022)  Transportation Needs: Unmet Transportation Needs (10/26/2022)  Utilities: Not At Risk (10/26/2022)  Alcohol Screen: Low Risk  (10/26/2022)  Depression (PHQ2-9): Low Risk  (11/11/2022)  Financial Resource Strain: High Risk (10/26/2022)  Tobacco Use: Low Risk  (11/12/2022)   Westley Hummer, MSW, LCSW Clinical Social Worker [place]  [phone]- work cell phone (preferred) [phone]- desk phone

## 2022-11-20 ENCOUNTER — Ambulatory Visit: Payer: Self-pay

## 2022-11-20 ENCOUNTER — Telehealth: Payer: Self-pay | Admitting: Licensed Clinical Social Worker

## 2022-11-20 ENCOUNTER — Ambulatory Visit (HOSPITAL_COMMUNITY)
Admission: RE | Admit: 2022-11-20 | Discharge: 2022-11-20 | Disposition: A | Discharge status: Home / Self Care | Payer: Self-pay | Source: Ambulatory Visit | Attending: Adult Health | Admitting: Adult Health

## 2022-11-20 ENCOUNTER — Encounter (HOSPITAL_COMMUNITY): Payer: Self-pay

## 2022-11-20 ENCOUNTER — Other Ambulatory Visit: Payer: Self-pay

## 2022-11-20 ENCOUNTER — Telehealth (INDEPENDENT_AMBULATORY_CARE_PROVIDER_SITE_OTHER): Payer: Self-pay

## 2022-11-20 VITALS — BP 160/92 | HR 62 | Wt 151.4 lb

## 2022-11-20 DIAGNOSIS — I340 Nonrheumatic mitral (valve) insufficiency: Secondary | ICD-10-CM | POA: Insufficient documentation

## 2022-11-20 DIAGNOSIS — Z8674 Personal history of sudden cardiac arrest: Secondary | ICD-10-CM | POA: Insufficient documentation

## 2022-11-20 DIAGNOSIS — I472 Ventricular tachycardia, unspecified: Secondary | ICD-10-CM

## 2022-11-20 DIAGNOSIS — I5022 Chronic systolic (congestive) heart failure: Secondary | ICD-10-CM | POA: Insufficient documentation

## 2022-11-20 DIAGNOSIS — I252 Old myocardial infarction: Secondary | ICD-10-CM | POA: Insufficient documentation

## 2022-11-20 DIAGNOSIS — N1831 Chronic kidney disease, stage 3a: Secondary | ICD-10-CM

## 2022-11-20 DIAGNOSIS — I2510 Atherosclerotic heart disease of native coronary artery without angina pectoris: Secondary | ICD-10-CM | POA: Insufficient documentation

## 2022-11-20 DIAGNOSIS — Z7982 Long term (current) use of aspirin: Secondary | ICD-10-CM | POA: Insufficient documentation

## 2022-11-20 DIAGNOSIS — Z7984 Long term (current) use of oral hypoglycemic drugs: Secondary | ICD-10-CM | POA: Insufficient documentation

## 2022-11-20 DIAGNOSIS — Z79899 Other long term (current) drug therapy: Secondary | ICD-10-CM | POA: Insufficient documentation

## 2022-11-20 DIAGNOSIS — I110 Hypertensive heart disease with heart failure: Secondary | ICD-10-CM | POA: Insufficient documentation

## 2022-11-20 DIAGNOSIS — I10 Essential (primary) hypertension: Secondary | ICD-10-CM

## 2022-11-20 DIAGNOSIS — Z95810 Presence of automatic (implantable) cardiac defibrillator: Secondary | ICD-10-CM | POA: Insufficient documentation

## 2022-11-20 DIAGNOSIS — E875 Hyperkalemia: Secondary | ICD-10-CM | POA: Insufficient documentation

## 2022-11-20 LAB — BASIC METABOLIC PANEL
Anion gap: 9 (ref 5–15)
BUN: 28 mg/dL — ABNORMAL HIGH (ref 8–23)
CO2: 24 mmol/L (ref 22–32)
Calcium: 9.1 mg/dL (ref 8.9–10.3)
Chloride: 104 mmol/L (ref 98–111)
Creatinine, Ser: 1.25 mg/dL — ABNORMAL HIGH (ref 0.61–1.24)
GFR, Estimated: >60 mL/min (ref >60)
Glucose, Bld: 183 mg/dL — ABNORMAL HIGH (ref 70–99)
Potassium: 4.2 mmol/L (ref 3.5–5.1)
Sodium: 137 mmol/L (ref 135–145)

## 2022-11-20 LAB — CUP PACEART REMOTE DEVICE CHECK
Battery Remaining Longevity: 128 mo
Battery Voltage: 3 V
Brady Statistic AP VP Percent: 0.05 %
Brady Statistic AP VS Percent: 68.95 %
Brady Statistic AS VP Percent: 0.01 %
Brady Statistic AS VS Percent: 31 %
Brady Statistic RA Percent Paced: 70.3 %
Brady Statistic RV Percent Paced: 0.06 %
Date Time Interrogation Session: 20240202105118
HighPow Impedance: 57 Ohm
Implantable Lead Connection Status: 753985
Implantable Lead Connection Status: 753985
Implantable Lead Implant Date: 20220220
Implantable Lead Implant Date: 20220220
Implantable Lead Location: 753859
Implantable Lead Location: 753860
Implantable Lead Model: 5076
Implantable Pulse Generator Implant Date: 20220220
Lead Channel Impedance Value: 266 Ohm
Lead Channel Impedance Value: 361 Ohm
Lead Channel Impedance Value: 399 Ohm
Lead Channel Pacing Threshold Amplitude: 0.375 V
Lead Channel Pacing Threshold Amplitude: 0.625 V
Lead Channel Pacing Threshold Pulse Width: 0.4 ms
Lead Channel Pacing Threshold Pulse Width: 0.4 ms
Lead Channel Sensing Intrinsic Amplitude: 1.8 mV
Lead Channel Sensing Intrinsic Amplitude: 15.3 mV
Lead Channel Setting Pacing Amplitude: 1.5 V
Lead Channel Setting Pacing Amplitude: 2 V
Lead Channel Setting Pacing Pulse Width: 0.4 ms
Lead Channel Setting Sensing Sensitivity: 0.3 mV
Zone Setting Status: 755011
Zone Setting Status: 755011
Zone Setting Status: 755011

## 2022-11-20 LAB — BRAIN NATRIURETIC PEPTIDE: B Natriuretic Peptide: 360.2 pg/mL — ABNORMAL HIGH (ref 0.0–100.0)

## 2022-11-20 MED ORDER — HYDRALAZINE HCL 25 MG PO TABS
25.0000 mg | ORAL_TABLET | Freq: Three times a day (TID) | ORAL | 3 refills | Status: DC
  Filled 2022-11-20: qty 90, 30d supply, fill #0
  Filled 2023-01-06: qty 90, 30d supply, fill #1

## 2022-11-20 NOTE — Progress Notes (Addendum)
H&V Care Navigation CSW Progress Note  Clinical Social Worker met with pt to follow up regarding patient assistance for medications   SDOH Screenings   Food Insecurity: Food Insecurity Present (11/20/2022)  Housing: Low Risk  (10/26/2022)  Transportation Needs: Unmet Transportation Needs (11/20/2022)  Utilities: Not At Risk (11/20/2022)  Alcohol Screen: Low Risk  (10/26/2022)  Depression (PHQ2-9): Low Risk  (11/11/2022)  Financial Resource Strain: High Risk (11/20/2022)  Tobacco Use: Low Risk  (11/20/2022)     Eliquis- CSW assisted in calling [facility] and confirming pt address- they will send out shipment due to arrive Tuesday- will automatically send out every 3 months   Entresto- needs no income attestation letter- pt does not have- CSW called [employer] to request a new one be sent to new address   Jardiance- needs no income letter- CSW had physician complete and sent to [place] at [facility] to submit  [place]- pt brought in application- CSW sent to [facility] CSW to send in complete application for review  Pt expressed food insecurity- CSW provided with heart and vascular food bag and food pantry information.  Jorge Ny, LCSW Clinical Social Worker [facility] Desk#: [phone] Cell#: [phone]

## 2022-11-20 NOTE — Progress Notes (Signed)
HEART & VASCULAR TRANSITION OF CARE  Follow Up     Referring Physician: Dr. Bonner Puna  Primary Care: [name], NP  Primary Cardiologist: Janina Mayo, MD   HPI: 67 y.o. spanish speaking male with a history of CAD s/p mid LAD shockwave and PCI, diagonal PCI July 2022, VT arrest s/p ICD, HFrEF (EF 30-35%), and poorly controlled T2DM (Hgb A1c 9). Received prior cardiac care at [facility] in [place]. Recently transferred care to [facility], had OV w/ Dr. Phineas Inches 11/23. Also plans to transfer EP care locally as well.   Admitted 1/24 w/ a/c CHF. Troponin 152 > 160 without ischemic ECG changes. BNP 729. CXR showed pulmonary edema and the patient was hypoxemic. Pt reported poor compliance w/ meds. Unable to afford due to cost. He was diuresed and GDMT restarted. Echo EF 30-35%, mild-mod MR, RV normal. Discharge home on 1/9, d/w wt 153 lb. Referred to [facility] clinic.   During visit at [facility] increased to 49-51. Lab work showed K elevated at 5.7. Asked to go to the ER and stop entresto and spiro. Treated with Lokelma 5mg  x1. Discharged home same day.   Repeat K during cards visit 1/24 was 5.3.    Here for second TOC visit. Remains stable. Walking 1 hr every day in the mornings but stays active throughout the day. Limits fluid intake to about 2-3 water bottles/day. Wt stable since d/c at 151 lb. Compliant with all medications. 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 His nephews don't make him pay for rent. Denies CP, SOB, orthopnea and PND. Denies fevers, chills, or recent illness.   Device interrogation: thoracic impedance stable, volume low, patient activity ~6->8hrs, No AT/AF, no shocks  Cardiac Testing  Echo 1/24 EF 30-35%, mild-mod MR, RV normal    Past Medical History:  Diagnosis Date   Cardiac arrest ([place])    CHF (congestive heart failure) (HCC)    Coronary artery disease    Diabetes mellitus without complication  ([place])    H/O heart artery stent    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    Current Outpatient Medications  Medication Sig Dispense Refill   apixaban (ELIQUIS) 5 MG TABS tablet Take 1 tablet (5 mg total) by mouth 2 (two) times daily. 180 tablet 3   aspirin EC 81 MG tablet Take 81 mg by mouth daily. Swallow whole.     atorvastatin (LIPITOR) 20 MG tablet Take 1 tablet (20 mg total) by mouth daily. 60 tablet 3   empagliflozin (JARDIANCE) 10 MG TABS tablet Take 1 tablet (10 mg total) by mouth daily before breakfast. 90 tablet 1   furosemide (LASIX) 40 MG tablet Take 1 tablet (40 mg total) by mouth daily. 30 tablet 0   metFORMIN (GLUCOPHAGE-XR) 500 MG 24 hr tablet Take 1 tablet (500 mg total) by mouth 2 (two) times daily with a meal. 180 tablet 1   metoprolol succinate (TOPROL-XL) 50 MG 24 hr tablet Take 1 tablet (50 mg total) by mouth daily. 30 tablet 0   sacubitril-valsartan (ENTRESTO) 49-51 MG Take 1 tablet by mouth 2 (two) times daily. (Patient not taking: Reported on 11/20/2022) 60 tablet 3   No current facility-administered medications for this encounter.    No Known Allergies    Social History   Socioeconomic History   Marital status: Single    Spouse name: Not on  file   Number of children: 1   Years of education: Not on file   Highest education level: 5th grade  Occupational History   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  Tobacco Use   Smoking status: Never    Passive exposure: Never   Smokeless tobacco: Never  Vaping Use   Vaping Use: Never used  Substance and Sexual Activity   Alcohol use: Never   Drug use: Never   Sexual activity: Not Currently    Partners: Female  Other Topics Concern   Not on file  Social History Narrative   Not on file   Social Determinants of Health   Financial Resource Strain: High Risk (10/26/2022)   Overall Financial Resource Strain (CARDIA)    Difficulty of Paying Living Expenses: Very hard  Food Insecurity: Food  Insecurity Present (10/26/2022)   Hunger Vital Sign    Worried About Charity fundraiser in the Last Year: Often true    Ran Out of Food in the Last Year: Often true  Transportation Needs: Unmet Transportation Needs (10/26/2022)   PRAPARE - Hydrologist (Medical): Yes    Lack of Transportation (Non-Medical): Yes  Physical Activity: Not on file  Stress: Not on file  Social Connections: Not on file  Intimate Partner Violence: Not At Risk (10/26/2022)   Humiliation, Afraid, Rape, and Kick questionnaire    Fear of Current or Ex-Partner: No    Emotionally Abused: No    Physically Abused: No    Sexually Abused: No      Family History  Problem Relation Age of Onset   Diabetes Mother    Diabetes Brother     Vitals:   11/20/22 1018  BP: (!) 160/92  Pulse: 62  SpO2: 98%  Weight: 68.7 kg (151 lb 6.4 oz)   This SmartLink has not been configured with any valid records.   Filed Weights   11/20/22 1018  Weight: 68.7 kg (151 lb 6.4 oz)    PHYSICAL EXAM: General:  well appearing.  No respiratory difficulty HEENT: normal Neck: supple. No JVD. Carotids 2+ bilat; no bruits. No lymphadenopathy or thyromegaly appreciated. Cor: PMI nondisplaced. Regular rate & rhythm. No rubs, gallops or murmurs. Lungs: clear Abdomen: soft, nontender, nondistended. No hepatosplenomegaly. No bruits or masses. Good bowel sounds. Extremities: no cyanosis, clubbing, rash, edema  Neuro: alert & oriented x 3, cranial nerves grossly intact. moves all 4 extremities w/o difficulty. Affect pleasant.   ECG: Not performed   ASSESSMENT & PLAN:  1. Chronic Systolic Heart Failure - ICM, CAD w/ known LAD and diagonal dz, s/p PCI - Has ICD - Echo 1/24 EF 30-35%, RV ok , EF unchanged from echo 2/23 EF 30-35%, RV normal at that time - NYHA Class I-II. Euvolemic on exam  - Continue GDMT titration  - Entresto and spiro recently stopped 2/2 hyperkalemia, recent K remains elevated at 5.3 would not  re-challenge for now - Will start hydralazine 25mg  TID for afterload reduction - Continue Jardiance 10 mg daily  - Continue Toprol XL 50 mg daily  - Continue Lasix 40 mg daily   2. CAD - h/o NSTEMI s/p PCI to LAD and diagonal at [place]  - denies CP  - continue asa, statin + ? blocker  3. H/o VT Arrest - s/p ICD, implanted at [facility]  - Dr. Sallyanne Kuster follows  4. Mitral Regurgitation - mild-mod on Echo 1/24 - suspect functional MR   5. Hypertension - 160/92 today - starting  meds as above  6. Hyperkalemia - K 5.7 during last visit, held entresto and spiro.  - Down to 5.3 during recent check - Check labs today  SDOH needs: CSW to see regarding food pantry with food concerns. Rx and CSW to continue to work on prescription costs/grants/PA.  NYHA I-II GDMT  Diuretic- Lasix 40 mg daily  BB- Toprol XL 50 mg daily  Ace/ARB/ARNI off with hyperkalemia MRA stopped with hyperkalemia  SGLT2i Jardiance 10 mg   Referred to HFSW (PCP, Medications, Transportation, ETOH Abuse, Drug Abuse, Insurance, Museum/gallery curator ): Yes  Refer to Pharmacy: Yes  Refer to Home Health: No Refer to Advanced Heart Failure Clinic: No  Refer to General Cardiology: Yes   Follow up  Will f/u w/ Gen cards.  Forestine Na, AGACNP-BC

## 2022-11-20 NOTE — Telephone Encounter (Signed)
Contacted pt to go go over lab results spoke with pt sister Cristina(she is on DPR) provided results pt sister doesn't have any questions or concerns

## 2022-11-20 NOTE — Patient Instructions (Addendum)
  Cambios de medicacin:  COMENZAR. Hidralazina 25 mg. Toma una pastilla tres veces al da.  Trabajo de laboratorio:  Los laboratorios se realizaron hoy, nos comunicaremos con usted si hay lecturas anormales.  Instrucciones especiales // Educacin:  Haga las siguientes cosas TODOS LOS DAS: 1) Psate por la maana antes del desayuno. Escrbalo y gurdelo en un registro. 2) Tome sus medicamentos segn lo prescrito 3) Consuma alimentos bajos en sal: limite la sal (sodio) a 2000 mg por da. 4) Mantente lo ms activo que [employer] 5) Limite todos los lquidos del da a menos de 2 litros.  Seguimiento en: Tendr su prxima cita de cardiologa con la [place] el 28/2 y continuar con ella en el futuro.     Medication Changes:  START. Hydralazine 25mg . Take one tablet, THREE times a day.   Lab Work:  Labs done today, we will contact you for abnormal readings.  Special Instructions // Education:  Do the following things EVERYDAY: Weigh yourself in the morning before breakfast. Write it down and keep it in a log. Take your medicines as prescribed Eat low salt foods--Limit salt (sodium) to 2000 mg per day.  Stay as active as you can everyday Limit all fluids for the day to less than 2 liters  Follow-Up in: You will have your next cardiology appointment with Dr. Harl Bowie on 2/28, and continue with her in the future.

## 2022-11-20 NOTE — Telephone Encounter (Signed)
H&V Care Navigation CSW Progress Note   Clinical Social Worker faxed letter of no income provided by Hrt/Vas TOC clinic to [facility] for Jardiance PAP. A new letter of attestation that has to be completed from [employer] P[employer] has been sent again to pt.  Pt will start to receive Eliquis from [employer] P[employer] starting Tuesday 2/6. Unfortunately the [facility] application received today did not include the actual application survey questions or pt signature therefore will try and have them complete it during 2/28 appt with interpreters since there continues to be ongoing challenges to getting this completed by pt/pt family.   Patient is participating in a Managed Medicaid Plan:  No, self pay, CAFA through 2/17  [place] Present (11/20/2022)  Housing: Low Risk  (10/26/2022)  Transportation Needs: Unmet Transportation Needs (11/20/2022)  Utilities: Not At Risk (11/20/2022)  Alcohol Screen: Low Risk  (10/26/2022)  Depression (PHQ2-9): Low Risk  (11/11/2022)  Financial Resource Strain: High Risk (11/20/2022)  Tobacco Use: Low Risk  (11/20/2022)   Westley Hummer, MSW, [place]  [phone]- work cell phone (preferred) [phone]- desk phone

## 2022-11-20 NOTE — Progress Notes (Addendum)
Updated phone number and address per patient request.  Cell: [phone]  [address]  [place], [place] [address]  Reviewed SDOH needs: pt stated no food at home, CSW to see regarding food pantry at clinic. Intermittent transportation issues, able to get friend to bring self and sister.   Rx and CSW to continue to work on prescription costs/grants/PA.

## 2022-11-20 NOTE — Telephone Encounter (Signed)
H&V Care Navigation CSW Progress Note  Clinical Social Worker  faxed letter of no income provided by Hrt/Vas TOC clinic  to [facility] for Jardiance PAP. A new letter of attestation that has to be completed from [employer] P[employer] has been sent again to pt.  Pt will start to receive Eliquis from [employer] P[employer] starting Tuesday 2/6.   Patient is participating in a Managed Medicaid Plan:  No, self pay, CAFA through 2/17  Pedro Kemp (11/20/2022)  Housing: Low Risk  (10/26/2022)  Transportation Needs: Unmet Transportation Needs (11/20/2022)  Utilities: Not At Risk (11/20/2022)  Alcohol Screen: Low Risk  (10/26/2022)  Depression (PHQ2-9): Low Risk  (11/11/2022)  Financial Resource Strain: High Risk (11/20/2022)  Tobacco Use: Low Risk  (11/20/2022)   Westley Hummer, MSW, [place]  [phone]- work cell phone (preferred) [phone]- desk phone

## 2022-11-25 ENCOUNTER — Other Ambulatory Visit: Payer: Self-pay

## 2022-11-26 ENCOUNTER — Telehealth: Payer: Self-pay

## 2022-11-26 NOTE — Telephone Encounter (Addendum)
Left a voice message for the patient with the help of [place] interpreter Raquel Sarna ID# [phone] asking patient to give office a call back for results   ----- Message from Sanda Klein, MD sent at 11/10/2022 10:06 AM EST ----- Potassium is till a little elevated. Need to recheck the BMET after a month on Entresto, please. Should stay off spironolactone permanently. 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

## 2022-11-27 ENCOUNTER — Other Ambulatory Visit: Payer: Self-pay

## 2022-11-27 ENCOUNTER — Other Ambulatory Visit: Payer: Self-pay | Admitting: Primary Care

## 2022-11-27 DIAGNOSIS — I10 Essential (primary) hypertension: Secondary | ICD-10-CM

## 2022-11-27 DIAGNOSIS — I5042 Chronic combined systolic (congestive) and diastolic (congestive) heart failure: Secondary | ICD-10-CM

## 2022-11-27 NOTE — Telephone Encounter (Signed)
Requested medications are due for refill today.  A little too soon  Requested medications are on the active medications list.  yes  Last refill. 11/09/2022 #30 0 rf  Future visit scheduled.   yes  Notes to clinic.  Rx signed by Susanne Greenhouse.    Requested Prescriptions  Pending Prescriptions Disp Refills   furosemide (LASIX) 40 MG tablet 30 tablet 0    Sig: Take 1 tablet (40 mg total) by mouth daily.     There is no refill protocol information for this order

## 2022-11-30 ENCOUNTER — Telehealth: Payer: Self-pay | Admitting: Licensed Clinical Social Worker

## 2022-11-30 NOTE — Telephone Encounter (Signed)
H&V Care Navigation CSW Progress Note  Clinical Social Worker contacted patient by phone to f/u on medications after confirming approval for Jardiance and shipment from [employer]. Was unable to reach pt at [phone] w/ assistance of interpreter Fabio Bering [id number], so I attempted pt sister [name]. We were able to reach her at [phone]. Confirmed he has been receiving Eliquis and Jardiance, she is unsure if letter for [employer] PAP for Delene Loll has been received yet. She will speak with pt and we asked them to bring to upcoming appt. No additional questions at this time.  Patient is participating in a Managed Medicaid Plan:  No, self pay, CAFA through 12/05/2022  [place] Present (11/20/2022)  Housing: Low Risk  (10/26/2022)  Transportation Needs: Unmet Transportation Needs (11/20/2022)  Utilities: Not At Risk (11/20/2022)  Alcohol Screen: Low Risk  (10/26/2022)  Depression (PHQ2-9): Low Risk  (11/11/2022)  Financial Resource Strain: High Risk (11/20/2022)  Tobacco Use: Low Risk  (11/20/2022)   Westley Hummer, MSW, [place]  [phone]- work cell phone (preferred) [phone]- desk phone

## 2022-12-01 ENCOUNTER — Other Ambulatory Visit: Payer: Self-pay

## 2022-12-04 ENCOUNTER — Other Ambulatory Visit: Payer: Self-pay

## 2022-12-08 ENCOUNTER — Other Ambulatory Visit: Payer: Self-pay

## 2022-12-08 ENCOUNTER — Ambulatory Visit: Payer: Self-pay | Attending: Cardiovascular Disease

## 2022-12-08 ENCOUNTER — Telehealth: Payer: Self-pay | Admitting: Cardiovascular Disease

## 2022-12-08 DIAGNOSIS — I10 Essential (primary) hypertension: Secondary | ICD-10-CM

## 2022-12-08 DIAGNOSIS — I5042 Chronic combined systolic (congestive) and diastolic (congestive) heart failure: Secondary | ICD-10-CM

## 2022-12-08 DIAGNOSIS — I255 Ischemic cardiomyopathy: Secondary | ICD-10-CM

## 2022-12-08 MED ORDER — FUROSEMIDE 40 MG PO TABS
40.0000 mg | ORAL_TABLET | Freq: Every day | ORAL | 1 refills | Status: DC
  Filled 2022-12-08: qty 30, 30d supply, fill #0
  Filled 2023-01-06: qty 30, 30d supply, fill #1

## 2022-12-08 NOTE — Telephone Encounter (Signed)
Spoke with patient through translator:   He is not having itching or chills. He is having some anxiety and SOB this am. Device is beeping every am around 9am.   Likely anxious due to device beeping. They have a remote monitor that looks like a  cell phone. They were unable to process through sending a transmission.  Appt made today with interpreter present (3:30pm) device clinic to check device and discuss remote monitoring as it appears patient is not transmitting.  [employer] rep to be present to assist.

## 2022-12-08 NOTE — Telephone Encounter (Signed)
Pt sister called  in today (needs interpretor). She states pt has been very itchy around his device site and he's been having chills. She also stated that the device is also making a noise. Please advise.

## 2022-12-08 NOTE — Telephone Encounter (Signed)
Best c/b - [phone]

## 2022-12-08 NOTE — Progress Notes (Unsigned)
Patient brought in to device clinic today after calling and reporting that his ICD has been beeping every am around 9. He was unable to send a transmission and was confused over the remote remote set up.  Interpreter present for the visit today and Industry rep, [employer], present to assist with remote set up.   Today patient reports that he has been feeling "full" and "pressure" not pain but more of a tightness to his abdomen. Also reports feeling "pounding heart beats" at times.  He denies any chest pain, SOB, dizziness or lightheadedness.   Device interrogation performed and note the following:  Normal Device function  Patient had 3 episodes to note (all noted on 11/30/22 between 9am-10am) .  2 appear Afib with RVR and 1 episode is flagged as VT but appears as a possible dual tachycardia but cannot rule out AF with RVR either.  Histogram reflects poor ventricular control during AF.  Optivol is trending up and indicates fluid retention which could correlate with fullness/ bloating patient is feeling in his abdomen.      Reviewed all with Dr. Lovena Le in office today:  Confirmed episodes as noted above with strong suspicion of the 3rd episode to be a dual tach.  Needs to follow up with EP to consider ventricular rate control and metoprolol dosing.  We did increase patient's Lasix today to take (1 tablet twice daily X 3 days) to assist with fluid retention.   Discussed with patient/interpreter all above changes.  Patient is very confused over his medications and spent over 1 hour working with him and interpreter to give clearly translated written guidance on his medication regimen.  Of note, he had not started his Jardiance that was prescribed at hospital discharge in early January.  He also thinks he may not have been taking his Lasix for several days because he ran out and didn't know to ask for refills.   I am very concerned for patient's ability to manage his medications at home and navigate  health issues right now due to language barrier.  PCP may want to consider care coordination services to help him navigate.   REMOTE MONITORING: Patient had been set up with the MDT APP for remote transmission.  [employer] (industry) was able to determine today that this will not be an effective tool for this patient.  He was able to switch patient over to a MDT Relay remote monitor that is much easier to navigate.  With interpreter assistance, they were able to walk patient through how to set up at home.   Plan: Patient needs appt with EP for follow up on V rate control and fluid retention.  Will clarify with Dr. Sallyanne Kuster who is following and have patient see either Dr. Myles Gip or Dr Sallyanne Kuster within the next week.   2.  Consider referral to heart failure clinic with EP 3.   Request for PCP to consider care coordination supports

## 2022-12-09 ENCOUNTER — Other Ambulatory Visit: Payer: Self-pay

## 2022-12-09 LAB — CUP PACEART INCLINIC DEVICE CHECK
Battery Remaining Longevity: 127 mo
Brady Statistic RA Percent Paced: 36.4 %
Brady Statistic RV Percent Paced: 0.1 % — CL
Date Time Interrogation Session: 20240220203119
HighPow Impedance: 52 Ohm
Implantable Lead Connection Status: 753985
Implantable Lead Connection Status: 753985
Implantable Lead Implant Date: 20220220
Implantable Lead Implant Date: 20220220
Implantable Lead Location: 753859
Implantable Lead Location: 753860
Implantable Lead Model: 5076
Implantable Pulse Generator Implant Date: 20220220
Lead Channel Impedance Value: 342 Ohm
Lead Channel Impedance Value: 418 Ohm
Lead Channel Pacing Threshold Amplitude: 0.5 V
Lead Channel Pacing Threshold Amplitude: 0.75 V
Lead Channel Pacing Threshold Amplitude: 0.75 V
Lead Channel Pacing Threshold Pulse Width: 0.4 ms
Lead Channel Pacing Threshold Pulse Width: 0.4 ms
Lead Channel Pacing Threshold Pulse Width: 0.4 ms
Lead Channel Sensing Intrinsic Amplitude: 1.6 mV
Lead Channel Sensing Intrinsic Amplitude: 12 mV

## 2022-12-09 NOTE — Telephone Encounter (Signed)
Outreach made to Pt's sister Altha Harm.    She denies needing a Optometrist, states she speaks [place].  Advised Pt needs follow up appt with Dr. Sallyanne Kuster (see documentation from clinic visit with device).  Pt scheduled to see Dr. Sallyanne Kuster Monday December 14 2022.

## 2022-12-09 NOTE — Progress Notes (Signed)
Remote ICD transmission.   

## 2022-12-10 ENCOUNTER — Telehealth: Payer: Self-pay | Admitting: Licensed Clinical Social Worker

## 2022-12-10 NOTE — Telephone Encounter (Signed)
H&V Care Navigation CSW Progress Note  Clinical Social Worker  reviewed notes from visit with device clinic yesterday  regarding ongoing medication understanding challenges. Pt speaks Spanish, has low health literacy and is currently under [employer]  since he does not qualify for insurance/Medicaid which effects how many services he can access. We have attempted through Care Navigation team and Pharmacy team to meet with and explain medications multiple times.   Pt has been provided with [employer] application which could cover his aspirin, atorvastatin, furosemide, hydralazine, metformin, but has struggled to return all pages (I have only received 4 of 6 at this time). He has been approved for PAP for Jardiance which is coming to his sister's [employer] address he used to use (confirmed this with her on 2/12), pt was approved for and should be receiving Eliquis at his current [address] address. In order for him to receive his Entresto through [employer] he has been sent attestation letter which it is hard to tell if has been received (both pt and pt sister have been informed to look for it). LCSW will call him again tomorrow and see if received. I called [employer] P[employer] this morning and if he does not have attestation letter they can call [phone] while in office and request letter be sent to [place] for him to complete. They will not allow me to request this today. LCSW will f/u with pt sister again tomorrow and see if letter received and encourage them to bring so we can submit along with ALL medications for re-education about how/when to take medications. If not able to Monday then I will f/u with clinic RN assisting Dr. Sallyanne Kuster as I will be at [facility] clinic Monday.   This all has been verbally shared with RN team at [facility] and I f/u with device team to share that care navigation team has been following pt regarding medication access, food access and financial assistance.   Patient is  participating in a Managed Medicaid Plan:  No, self pay, has CAFA through 2/17. Will assist with re-applying during appt with Dr. Harl Bowie.   SDOH Screenings   Food Insecurity: Food Insecurity Present (11/20/2022)  Housing: Low Risk  (10/26/2022)  Transportation Needs: Unmet Transportation Needs (11/20/2022)  Utilities: Not At Risk (11/20/2022)  Alcohol Screen: Low Risk  (10/26/2022)  Depression (PHQ2-9): Low Risk  (11/11/2022)  Financial Resource Strain: High Risk (11/20/2022)  Tobacco Use: Low Risk  (11/20/2022)    Westley Hummer, MSW, LCSW Clinical Social Worker [place]  [phone]- work cell phone (preferred) [phone]- desk phone

## 2022-12-11 ENCOUNTER — Telehealth: Payer: Self-pay | Admitting: Licensed Clinical Social Worker

## 2022-12-11 NOTE — Telephone Encounter (Signed)
H&V Care Navigation CSW Progress Note  Clinical Social Worker contacted caregiver by phone to f/u on request to bring paperwork to clinic on Monday for [employer] PAP. No answer on pt sister's cell at [phone], no voice mail set up. LCSW was able to leave a voicemail on pt number at [phone] reminding him to bring attestation letter now sent twice by [employer] for [facility], if he does not then they can call [phone] while in office and request letter be sent to [place] for him to complete while in office. I will route this to Dr. Victorino December team.   Patient is participating in a Managed Medicaid Plan:  No self pay only, CAFA renewal needed.   SDOH Screenings   Food Insecurity: Food Insecurity Present (11/20/2022)  Housing: Low Risk  (10/26/2022)  Transportation Needs: Unmet Transportation Needs (11/20/2022)  Utilities: Not At Risk (11/20/2022)  Alcohol Screen: Low Risk  (10/26/2022)  Depression (PHQ2-9): Low Risk  (11/11/2022)  Financial Resource Strain: High Risk (11/20/2022)  Tobacco Use: Low Risk  (11/20/2022)   Westley Hummer, MSW, LCSW Clinical Social Worker [place]  [phone]- work cell phone (preferred) [phone]- desk phone

## 2022-12-14 ENCOUNTER — Ambulatory Visit: Payer: Self-pay | Attending: Cardiovascular Disease | Admitting: Cardiovascular Disease

## 2022-12-14 ENCOUNTER — Other Ambulatory Visit: Payer: Self-pay

## 2022-12-14 ENCOUNTER — Encounter: Payer: Self-pay | Admitting: Cardiovascular Disease

## 2022-12-14 VITALS — BP 130/62 | HR 72 | Ht 63.0 in | Wt 149.6 lb

## 2022-12-14 DIAGNOSIS — Z95810 Presence of automatic (implantable) cardiac defibrillator: Secondary | ICD-10-CM

## 2022-12-14 DIAGNOSIS — N1831 Chronic kidney disease, stage 3a: Secondary | ICD-10-CM

## 2022-12-14 DIAGNOSIS — I480 Paroxysmal atrial fibrillation: Secondary | ICD-10-CM

## 2022-12-14 DIAGNOSIS — E875 Hyperkalemia: Secondary | ICD-10-CM

## 2022-12-14 DIAGNOSIS — I472 Ventricular tachycardia, unspecified: Secondary | ICD-10-CM

## 2022-12-14 DIAGNOSIS — I5042 Chronic combined systolic (congestive) and diastolic (congestive) heart failure: Secondary | ICD-10-CM

## 2022-12-14 DIAGNOSIS — I25118 Atherosclerotic heart disease of native coronary artery with other forms of angina pectoris: Secondary | ICD-10-CM

## 2022-12-14 DIAGNOSIS — I10 Essential (primary) hypertension: Secondary | ICD-10-CM

## 2022-12-14 MED ORDER — METOPROLOL SUCCINATE ER 50 MG PO TB24
50.0000 mg | ORAL_TABLET | Freq: Every day | ORAL | 3 refills | Status: DC
  Filled 2022-12-14: qty 90, 90d supply, fill #0
  Filled 2023-04-09: qty 90, 90d supply, fill #1

## 2022-12-14 NOTE — Patient Instructions (Signed)
Medication Instructions:  Restart Metoprolol Succinate- Take '25mg'$ = 1/2 tab daily for one week, then take '50mg'$ = 1 tablet daily *If you need a refill on your cardiac medications before your next appointment, please call your pharmacy*  Follow-Up: At [facility], you and your health needs are our priority.  As part of our continuing mission to provide you with exceptional heart care, we have created designated Provider Care Teams.  These Care Teams include your primary Cardiologist (physician) and Advanced Practice Providers (APPs -  Physician Assistants and Nurse Practitioners) who all work together to provide you with the care you need, when you need it.  We recommend signing up for the patient portal called "MyChart".  Sign up information is provided on this After Visit Summary.  MyChart is used to connect with patients for Virtual Visits (Telemedicine).  Patients are able to view lab/test results, encounter notes, upcoming appointments, etc.  Non-urgent messages can be sent to your provider as well.   To learn more about what you can do with MyChart, go to [email or web address].    Your next appointment:   1 year(s) Device  Provider:   Dr Sallyanne Kuster  Other Instructions Please bring 3 months of check stubs for you and your household member. We will fax this information into Patient Assistance for your [facility]

## 2022-12-14 NOTE — Progress Notes (Signed)
H&V Care Navigation CSW Progress Note  Clinical Social Worker contacted caregiver by phone to f/u after appt as pt was not accompanied by his sister Wenda Overland who usually assists with understanding. LCSW was able to reach her with the assistance of [place] [place] [employer] [id number]. She shares that she had to take another family member to an appt so left when interpreter came. She confirms pt has just started working a job, only a few weeks ago. Discussed call RN made with pt regarding paperwork for his Entresto. Pt sister will ensure he brings that in along with a letter of start date since it has only been a few weeks and not three months. Pt sister shares that she is not sure if his Eliquis and Jardiance actually came (first time reporting this). LCSW shared that he needs to let us know if not receiving. I will send her a med list with numbers of each pharmacy if not receiving medications. I encouraged her that the only way we can be effective in ensuring he understands what he is taking is if he brings ALL his medications to appointments. Pt sister states understanding. I will also text her the numbers for Eliquis and Jardiance, a reminder for paystubs in [place] and the date of his upcoming appt. Remain available, this continues to be a complicated case as limited by language barriers and health literacy.   Patient is participating in a Managed Medicaid Plan:  No, self pay.   SDOH Screenings   Food Insecurity: Food Insecurity Present (11/20/2022)  Housing: Low Risk  (10/26/2022)  Transportation Needs: Unmet Transportation Needs (11/20/2022)  Utilities: Not At Risk (11/20/2022)  Alcohol Screen: Low Risk  (10/26/2022)  Depression (PHQ2-9): Low Risk  (11/11/2022)  Financial Resource Strain: High Risk (11/20/2022)  Tobacco Use: Low Risk  (12/14/2022)    Westley Hummer, MSW, LCSW Clinical Social Worker [place]  [phone]- work cell phone  (preferred) [phone]- desk phone

## 2022-12-14 NOTE — Telephone Encounter (Signed)
Today at pt's appt, [employer] was called using the interpreter. Was told that what is needed is proof of income. Pt told to bring 3 months worth of pay stubs for each household member (working) and we will fax it to [employer]. Pt verbalized understanding, and said he would bring pay stubs.   Fax: [phone] Attention: [id number]

## 2022-12-16 ENCOUNTER — Ambulatory Visit: Payer: Self-pay | Admitting: Internal Medicine

## 2022-12-20 ENCOUNTER — Encounter: Payer: Self-pay | Admitting: Cardiovascular Disease

## 2022-12-20 NOTE — Progress Notes (Signed)
Cardiology Office Note:    Date:  12/20/2022   ID:  Pedro Kemp, DOB May 20, 1956, MRN [id number]  PCP:  [name], NP   [facility] Prov[facility] Cardiologist:  Janina Mayo, MD     Referring MD: [name], NP   Chief Complaint  Patient presents with   Congestive Heart Failure    History of Present Illness:    Pedro Kemp is a 67 y.o. 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  He has not been performing remote ICD transmissions.  A Spanish translator was present.  He was in the device clinic a few days ago since his defibrillator had been beeping.  The device showed evidence of paroxysmal atrial fibrillation fibrillation: 3 separate episodes that all occurred on February 12 lasting for about an hour.  Overall burden of A-fib in the last 3 weeks is a total of 4 hours, with significant RVR, the final episode may have been a dual tachycardia with a period of VT.  OptiVol was still trending up.  We are still having issues getting him all the necessary medications.  He has not been taking either Entresto (we called the patient assistance program today and they told us they require 3 paystubs before they can approve his assistance) and has not been taking metoprolol succinate (not sure why).  He is on Jardiance and furosemide.  He is on Eliquis.  His breathing has improved since starting the Jardiance and furosemide.  He is otherwise doing okay.  He still has some shortness of breath but does not have edema, orthopnea, PND or any chest pain since he was seen in the clinic on February 20.  He was recently discharged from the hospital (January 05-January 08) where he had  a heart failure admission.  He had not been taking medications for about a month, due to the cost.  Interrogation of his defibrillator today actually shows that his fluid overload had began much earlier around December 24.  On admission he had pulmonary edema and hypoxemia and the BNP was 729.  He responded well to intravenous diuretics and at discharge his "euvolemic weight" was estimated to be 60 to 69 kg.  Note was made during that admission that he probably has CKD stage IIIa.  Discharge medications included Jardiance 10 mg daily, furosemide 40 mg daily, metoprolol succinate 50 mg daily, Entresto 24-26 mg daily and spironolactone 25 mg daily.  An application for assistance with the expensive medications was started in the hospital, but was not submitted in its entirety due to the absence of income related documents from the family.  During his hospitalization echocardiogram showed a dilated left ventricle (end-diastolic diameter 6.2 cm) with moderate to severely reduced systolic function (EF [id number]) and mild to moderate mitral insufficiency.  He was sent back to the emergency room on January 19 by his PCP for hyperkalemia.  His potassium was 5.7.  Spironolactone was stopped.  He has been doing reasonably well since discharge.  He does not have orthopnea, PND or lower extremity edema.  His weight remains lower than it was at hospital discharge at 67 kg (147 pounds).  He describes NYHA functional class II exertional dyspnea.  He has not had chest pain.  He has not had  syncope, dizziness or palpitations.  Device interrogation shows normal function of his defibrillator which is still essentially beginning of life with estimated longevity of 11 years.  He has 29.5% atrial pacing and does not require ventricular pacing.  As mentioned his OptiVol showed evidence of fluid overload starting on December 24.  OptiVol is now at baseline.  He has had some atrial fibrillation but very low prevalence at 0.1% (a total  of 14 hours of atrial fibrillation in the last year.  He had a 51-minute episode of atrial fibrillation on September 30, 2022 and a almost 6-hour episode of atrial fibrillation about a year ago).  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    Past Surgical History:  Procedure Laterality Date   CARDIAC CATHETERIZATION     ICD IMPLANT      Current Medications: Current Meds  Medication Sig   apixaban (ELIQUIS) 5 MG TABS tablet Take 1 tablet (5 mg total) by mouth 2 (two) times daily.   aspirin EC 81 MG tablet Take 81 mg by mouth daily. Swallow whole.   atorvastatin (LIPITOR) 20 MG tablet Take 1 tablet (20 mg total) by mouth daily.   empagliflozin (JARDIANCE) 10 MG TABS tablet Take 1 tablet (10 mg total) by mouth daily before breakfast.   furosemide (LASIX) 40 MG tablet Take 1 tablet (40 mg total) by mouth daily.   hydrALAZINE (APRESOLINE) 25 MG tablet Take 1 tablet (25 mg total) by mouth 3 (three) times daily.   metFORMIN (GLUCOPHAGE-XR) 500 MG 24 hr tablet Take 1 tablet (500 mg total) by mouth 2 (two) times daily with a meal.   metoprolol succinate (TOPROL-XL) 50 MG 24 hr tablet Take 1 tablet (50 mg total) by mouth daily. Take with or immediately following a meal.     Allergies:   Patient has no known allergies.   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  Tobacco Use   Smoking status: Never    Passive exposure: Never   Smokeless tobacco: Never  Vaping Use   Vaping Use: Never used  Substance and Sexual Activity   Alcohol use: Never   Drug use: Never    Sexual activity: Not Currently    Partners: Female  Other Topics Concern   Not on file  Social History Narrative   Not on file   Social Determinants of Health   Financial Resource Strain: High Risk (11/20/2022)   Overall Financial Resource Strain (CARDIA)    Difficulty of Paying Living Expenses: Very hard  Food Insecurity: Food Insecurity Present (11/20/2022)   Hunger Vital Sign    Worried About Running Out of Food in the Last Year: Sometimes true    Ran Out of Food in the Last Year: Sometimes true  Transportation Needs: Unmet Transportation Needs (11/20/2022)   PRAPARE - Hydrologist (Medical): Yes    Lack of Transportation (Non-Medical): Yes  Physical Activity: Not on file  Stress: Not on file  Social  Connections: Not on file     Family History: The patient's family history includes Diabetes in his brother and mother.  ROS:   Please see the history of present illness.     All other systems reviewed and are negative.  EKGs/Labs/Other Studies Reviewed:    The following studies were reviewed today: Records from hospitalization at [facility] including labs and imaging studies  Echocardiogram 10/24/2022    1. Left ventricular ejection fraction, by estimation, is 30 to 35%. The  left ventricle has moderately decreased function. The left ventricle  demonstrates global hypokinesis. The left ventricular internal cavity size  was moderately to severely dilated.  Left ventricular diastolic parameters are indeterminate.   2. Right ventricular systolic function is normal. The right ventricular  size is normal. There is normal pulmonary artery systolic pressure.   3. Left atrial size was moderately dilated.   4. The mitral valve is normal in structure. Mild to moderate mitral valve  regurgitation. No evidence of mitral stenosis.   5. The aortic valve is tricuspid. Aortic valve regurgitation is not  visualized. No aortic stenosis is present.   Comparison(s):  Prior images reviewed side by side.   Conclusion(s)/Recommendation(s): EF remains reduced, with severe dilation  of LV. MR better appreciated on current study.    EKG:  EKG is  ordered today.  The ekg ordered today demonstrates atrial paced, ventricular sensed rhythm, delayed R wave progression and nonspecific IVCD (QRS 114 ms), persistent ST segment elevation in leads V1-V3, downsloping ST segment depression and T wave inversion V4-V6, I, II, aVL.  Normal QTc 414 ms  Recent Labs: 01/30/2022: Magnesium 1.9 10/23/2022: ALT 26 11/06/2022: Hemoglobin 14.9; Platelets 203 11/20/2022: B Natriuretic Peptide 360.2; BUN 28; Creatinine, Ser 1.25; Potassium 4.2; Sodium 137  Recent Lipid Panel    Component Value Date/Time   CHOL 99 (L) 01/07/2022 1203   TRIG 236 (H) 01/07/2022 1203   HDL 31 (L) 01/07/2022 1203   CHOLHDL 3.2 01/07/2022 1203   LDLCALC 31 01/07/2022 1203     Risk Assessment/Calculations:            Physical Exam:    VS:  BP 130/62 (BP Location: Left Arm, Patient Position: Sitting, Cuff Size: Normal)   Pulse 72   Ht '5\' 3"'$  (1.6 m)   Wt 149 lb 9.6 oz (67.9 kg)   SpO2 97%   BMI 26.50 kg/m     Wt Readings from Last 3 Encounters:  12/14/22 149 lb 9.6 oz (67.9 kg)  11/20/22 151 lb 6.4 oz (68.7 kg)  11/11/22 150 lb 9.6 oz (68.3 kg)      General: Alert, oriented x3, no distress, healthy ICD site Head: no evidence of trauma, PERRL, EOMI, no exophtalmos or lid lag, no myxedema, no xanthelasma; normal ears, nose and oropharynx Neck: normal jugular venous pulsations and no hepatojugular reflux; brisk carotid pulses without delay and no carotid bruits Chest: clear to auscultation, no signs of consolidation by percussion or palpation, normal fremitus, symmetrical and full respiratory excursions Cardiovascular: normal position and quality of the apical impulse, regular rhythm, normal first and second heart sounds, no murmurs, rubs or gallops Abdomen: no tenderness or distention, no  masses by palpation, no abnormal pulsatility or arterial bruits, normal bowel sounds, no hepatosplenomegaly Extremities: no clubbing, cyanosis or edema; 2+ radial, ulnar and brachial pulses bilaterally; 2+ right femoral, posterior tibial and dorsalis pedis pulses; 2+ left femoral, posterior tibial and dorsalis pedis pulses; no subclavian or femoral bruits Neurological: grossly nonfocal Psych: Normal  mood and affect   ASSESSMENT:    1. Chronic combined systolic (congestive) and diastolic (congestive) heart failure ([place])   2. Coronary artery disease involving native coronary artery of native heart with other form of angina pectoris ([place])   3. ICD (implantable cardioverter-defibrillator) in place   4. Ventricular tachycardia (HCC)   5. Paroxysmal atrial fibrillation (HCC)   6. Stage 3a chronic kidney disease ([place])   7. Hyperkalemia   8. Essential hypertension     PLAN:    In order of problems listed above:  CHF: He had heart failure decompensation due to inability to get his medicines.  We are still having issues getting him back on everything that he needs.  He is on furosemide and Jardiance and it appears that his volume status is improving.  He had atrial fibrillation with RVR which probably explains his shortness of breath or chest pain several days ago.  He was not taking metoprolol succinate but we have started this.  We were on the phone with the manufacturer to get him assistance for [facility].  He needs to send him copies of his last 3 paystubs. CAD: He had transient chest pain which was probably related to A-fib with RVR.  He has history of extensive anterior infarction.  EKG suggests anterior aneurysm formation.  He does not have angina pectoris with usual physical activity.  More than 12 months have passed since his acute coronary event.  He is on, high-dose statin and we are restarting his beta-blocker today.  Stopped aspirin when we started Eliquis. ICD: Normal device function.  I  think we finally managed to get him hooked up to our device clinic. VT: None has been recorded recently.  He had cardiac arrest due to sustained monomorphic VT during his hospitalization in July 2022.  The arrhythmia was inducible at EP study. AFib: Symptomatic due to RVR.  Restarting metoprolol succinate today.  He may need a higher dose, but due to his heart failure and possible volume overload we will start this gradually.  CHA2DS2-VASc score is at least 4 (CHF, CAD, HTN, DM).   CKD 3: Suspect this is related to diabetes.  Unclear to what degree it is related to poor cardiac output.  Baseline creatinine seems to be around 1.3, corresponding to a GFR of 55-60.  He will benefit from SGLT2 inhibitors and Entresto to delay the progression of kidney disease in the setting of diabetes. 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  I do not think he will be able to take spironolactone. HTN: Prefer to treat this with Entresto, beta-blocker.      He has a heart failure transition of care visit scheduled on February 2 and a visit with Dr. Phineas Inches on February 28.  At these visits will have to reevaluate his potassium level, renal function and make sure that he has access to [place], [place], Eliquis via the patient assistance programs.   Medication Adjustments/Labs and Tests Ordered: Current medicines are reviewed at length with the patient today.  Concerns regarding medicines are outlined above.  No orders of the defined types were placed in this encounter.  Meds ordered this encounter  Medications   metoprolol succinate (TOPROL-XL) 50 MG 24 hr tablet    Sig: Take 1 tablet (50 mg total) by mouth daily. Take with or immediately following a meal.    Dispense:  90 tablet    Refill:  3  Patient Instructions  Medication Instructions:  Restart Metoprolol Succinate- Take '25mg'$ = 1/2 tab daily for one week, then take  '50mg'$ = 1 tablet daily *If you need a refill on your cardiac medications before your next appointment, please call your pharmacy*  Follow-Up: At [facility], you and your health needs are our priority.  As part of our continuing mission to provide you with exceptional heart care, we have created designated Provider Care Teams.  These Care Teams include your primary Cardiologist (physician) and Advanced Practice Providers (APPs -  Physician Assistants and Nurse Practitioners) who all work together to provide you with the care you need, when you need it.  We recommend signing up for the patient portal called "MyChart".  Sign up information is provided on this After Visit Summary.  MyChart is used to connect with patients for Virtual Visits (Telemedicine).  Patients are able to view lab/test results, encounter notes, upcoming appointments, etc.  Non-urgent messages can be sent to your provider as well.   To learn more about what you can do with MyChart, go to [email or web address].    Your next appointment:   1 year(s) Device  Provider:   Dr Sallyanne Kuster  Other Instructions Please bring 3 months of check stubs for you and your household member. We will fax this information into Patient Assistance for your Luella Cook, Sanda Klein, MD  12/20/2022 1:51 PM    [place]

## 2023-01-06 ENCOUNTER — Other Ambulatory Visit: Payer: Self-pay

## 2023-01-06 ENCOUNTER — Telehealth: Payer: Self-pay | Admitting: Licensed Clinical Social Worker

## 2023-01-06 NOTE — Telephone Encounter (Signed)
H&V Care Navigation CSW Progress Note  Clinical Social Worker contacted patient by phone to remind pt of appt tomorrow and request he bring check stubs and all his medications. There continues to be much confusion around what pt is taking/not taking. He should be receiving Jardiance and Eliquis via mail. Eliquis was confirmed to be delivered by [facility]MS on 2/6 and next shipment will be sent to clinic office in April. Jardiance can be transitioned to office from his sisters house, but pt needs to give permission to [facility]. I will discuss this with pt tomorrow. Entresto [employer]) needs last three months of income statements. Since pt has not worked for three months so far we will send what he brings and call [employer] PAP.   Pt answered at [phone], with assistance of interpreter [employer] [id number] pt was advised of the above. He is planning on appt with his sister [name]. I encouraged twice that he brings ALL medications in their bottles etc so we know what he has and is taking. We can assist again with [facility] application as that has come back incomplete each time we have given it to pt. Pt also needs to complete new [employer]  as previous application expired in February. I have discussed the above also with Dr. Lurline Del RN Joellen [place].    Patient is participating in a Managed Medicaid Plan:  No, self pay only.   SDOH Screenings   Food Insecurity: Food Insecurity Present (11/20/2022)  Housing: Low Risk  (10/26/2022)  Transportation Needs: Unmet Transportation Needs (11/20/2022)  Utilities: Not At Risk (11/20/2022)  Alcohol Screen: Low Risk  (10/26/2022)  Depression (PHQ2-9): Low Risk  (11/11/2022)  Financial Resource Strain: High Risk (11/20/2022)  Tobacco Use: Low Risk  (12/20/2022)   Westley Hummer, MSW, LCSW Clinical Social Worker [place]  [phone]- work cell phone (preferred) [phone]- desk phone

## 2023-01-07 ENCOUNTER — Encounter: Payer: Self-pay | Admitting: Internal Medicine

## 2023-01-07 ENCOUNTER — Ambulatory Visit: Payer: Self-pay | Attending: Internal Medicine | Admitting: Internal Medicine

## 2023-01-07 VITALS — BP 126/68 | HR 69 | Ht 62.0 in | Wt 148.4 lb

## 2023-01-07 DIAGNOSIS — Z79899 Other long term (current) drug therapy: Secondary | ICD-10-CM

## 2023-01-07 NOTE — Progress Notes (Signed)
H&V Care Navigation CSW Progress Note  Clinical Social Worker met with patient and pt sister Pedro Kemp to f/u on assistance paperwork and discuss medication PAPs. LCSW assisted by interpreter Pedro Kemp, reviewed new [facility] Financial Assistance and [employer]. Pt has applied for these in the past and is familiar with them and needed documents. He forgot his check stubs and will try and locate and bring them for these applications and for Entresto PAP. We discussed his Eliquis when refilled in April will be sent to our office. We called [employer] and arranged to have Jardiance sent to [place] when up for refill in May. Pt agreeable to these, understands we will call when medications have arrived. Other, generic medications can be filled by [facility] and we reviewed that application also. No additional questions from pt/pt sister. She was given another card for me also, and will bring by applications when they have completed those.   I will f/u if I do not hear from them in the next few weeks to answer any additional questions.   Patient is participating in a Managed Medicaid Plan:  No, self pay only. Will work on [place] Present (11/20/2022)  Housing: Low Risk  (10/26/2022)  Transportation Needs: Unmet Transportation Needs (11/20/2022)  Utilities: Not At Risk (11/20/2022)  Alcohol Screen: Low Risk  (10/26/2022)  Depression (PHQ2-9): Low Risk  (11/11/2022)  Financial Resource Strain: High Risk (11/20/2022)  Tobacco Use: Low Risk  (01/07/2023)   Pedro Kemp, MSW, LCSW Clinical Social Worker [place]  [phone]- work cell phone (preferred) [phone]- desk phone

## 2023-01-07 NOTE — Progress Notes (Signed)
Cardiology Office Note:    Date:  01/07/2023   ID:  Pedro Kemp, DOB 01-17-1956, MRN [id number]  PCP:  Pedro Perna, NP   [facility] Providers Cardiologist:  Pedro Mayo, MD     Referring MD: Pedro Perna, NP   No chief complaint on file.  Post Cardiac Arrest  History of Present Illness:    Pedro Kemp is a 67 y.o. male with a hx of T2DM, HLD, CKD stage IIIa, Ischemic CM,  spanish speaking requires an interpreter, referral for post hospitalization at [facility] for ischemic VT and cardiac arrest (interpretor Pedro Kemp [id number])   He was admitted at [facility] 05/14/2021 to 05/22/2021.[name] presented to [facility] with chest pain, noted to have high risk NSTEMI and acute CHF with EF 35%. He had troponin up to 15 thousand. He had cath on 7/27 that showed 100% occluded mid LAD s/p shockwave and PCI. He also had disease in his diag s/p PCI. He subsequently had VT arrest 7/29 with ACLS and ROSC. He went back to the cath lab on 7/29 and stent was patent. This was concerning for scar mediated VT.  He had an EP study on 8/2 with inducible sustained monomorphic VT.  Managed with amiodarone.He underwent DC-ICD.  He had a moderate sized hematoma at the ICD site requiring monitoring.  He was also managed for acute respiratory failure 2/2 CHF exacerbation. Crt on discharge was 1.38. A1c was 9 %. He was discharged on asa 81 mg daily,  atorvastatin 80 mg daily, plavix 75 mg daily, lisinopril 20 mg daily, lasix 80 mg daily, metop XL 50 mg daily. Started on Jardiance 10 mg. His A1c is 7.5 %   He had appointments for cardiac rehab, with cardiology at [facility]: Dr Pedro Kemp. ICD check with Dr. Margrett Kemp in August.   He states that he did not do exercise afterwards. After he left the hospital, he had his ICD checked. He would like to have his care here. He reports no shocks. He has no swelling or pain at the site. He denies exertional chest pain or dyspnea. He denies orthopnea, PND, no LE edema. During the day  , he does some work around the house.  Prior to his hospitalization , he had no problems with the heart. He does not smoke.  He denies palpitations, LH. He gets some dizziness. His vision gets blurry. He has no hx of syncope. He is not working will need medication assistance.  Interim Hx: He says that his medications ran out. He did not return to the pharmacy for refills. He feels well. He has no chest pain or dyspnea on exertion. He denies orthopnea,  PND, or LE edema. He can do his activities without any issues.  Interim Hx 09/07/2022 Went to the ED on September, 5th for vague chest pressure. Felt like heartburn. ECG was atrial paced. He feels better. No heartburn. He notes some SOB only at night. He also notes his ICD is making noise  Wt Readings from Last 3 Encounters:  01/07/23 148 lb 6.4 oz (67.3 kg)  12/14/22 149 lb 9.6 oz (67.9 kg)  11/20/22 151 lb 6.4 oz (68.7 kg)   Interim hx 01/07/2023 Hospitalized in early Jan for decompensated CHF. P/w pulmonary edema. In the setting of not taking his medications. He was diuresed. He was discharged on metop XL 50 mg d, entresto 24-26 mg daily, spironolactone 25 mg daily. He was discharged. On Jan 19, his BMET was checked and he had K of  5.7. Spironolactone was stopped.  He saw Dr. Sallyanne Kemp in January and Feb 2024. Noted Pedro Kemp has not been performing remote ICD transmissions. He interrogated his device which showed evidence of paroxysmal atrial fibrillation fibrillation: 3 separate episodes that all occurred on February 12 lasting for about an hour. Overall burden of A-fib in the last 3 weeks is a total of 4 hours, with significant RVR, the final episode may have been a dual tachycardia with a period of VT. OptiVol was still trending up. Patient has struggled with getting entresto, 2/2 not providing paystubs for patient assistance and not taking metoprolol for unclear reason. He was asymptomatic at that time. He otherwise was started on  eliquis.  Today, he feels well. Notes he is taking the medications on his list. Pending entresto.He denies angina, dyspnea on exertion, lower extremity edema, PND or orthopnea.    Wt Readings from Last 3 Encounters:  01/07/23 148 lb 6.4 oz (67.3 kg)  12/14/22 149 lb 9.6 oz (67.9 kg)  11/20/22 151 lb 6.4 oz (68.7 kg)     Cardiology Studies  05/14/2021-Cath - RFA. 100% occluded LAD s/p Synergy 2.75 x 38 mm DES with shockwave to the LAD. 90% D1 s/p 2.5 x 12 synergy DES  Echo- EF 35%, mild MR, PASP 25 mmHg  S/p DC Medtronic ICD 05/20/2021 AAI-DDD VF zone > 182 bpm ATP x3, 40J x6 VT off Base rate 60 bpm   Past Medical History:  Diagnosis Date   Cardiac arrest ([place])    CHF (congestive heart failure) ([place])    Coronary artery disease    Diabetes mellitus without complication ([place])    H/O heart artery stent    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    Past Surgical History:  Procedure Laterality Date   CARDIAC CATHETERIZATION     ICD IMPLANT      Current Medications: Current Meds  Medication Sig   apixaban (ELIQUIS) 5 MG TABS tablet Take 1 tablet (5 mg total) by mouth 2 (two) times daily.   aspirin EC 81 MG tablet Take 81 mg by mouth daily. Swallow whole.   atorvastatin (LIPITOR) 20 MG tablet Take 1 tablet (20 mg total) by mouth daily.   empagliflozin (JARDIANCE) 10 MG TABS tablet Take 1 tablet (10 mg total) by mouth daily before breakfast.   furosemide (LASIX) 40 MG tablet Take 1 tablet (40 mg total) by mouth daily.   hydrALAZINE (APRESOLINE) 25 MG tablet Take 1 tablet (25 mg total) by mouth 3 (three) times daily.   metFORMIN (GLUCOPHAGE-XR) 500 MG 24 hr tablet Take 1 tablet (500 mg total) by mouth 2 (two) times daily with a meal.   metoprolol succinate (TOPROL-XL) 50 MG 24 hr tablet Take 1 tablet (50 mg total) by mouth daily. Take with or immediately following a meal.     Allergies:   Patient has no known allergies.   Social History    Socioeconomic History   Marital status: Single    Spouse name: Not on file   Number of children: 1   Years of education: Not on file   Highest education level: 5th grade  Occupational History   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  Tobacco Use   Smoking status: Never    Passive exposure: Never   Smokeless tobacco: Never  Vaping Use   Vaping Use: Never used  Substance and Sexual Activity   Alcohol use: Never   Drug use: Never   Sexual activity: Not  Currently    Partners: Female  Other Topics Concern   Not on file  Social History Narrative   Not on file   Social Determinants of Health   Financial Resource Strain: High Risk (11/20/2022)   Overall Financial Resource Strain (CARDIA)    Difficulty of Paying Living Expenses: Very hard  Food Insecurity: Food Insecurity Present (11/20/2022)   Hunger Vital Sign    Worried About Running Out of Food in the Last Year: Sometimes true    Ran Out of Food in the Last Year: Sometimes true  Transportation Needs: Unmet Transportation Needs (11/20/2022)   PRAPARE - Hydrologist (Medical): Yes    Lack of Transportation (Non-Medical): Yes  Physical Activity: Not on file  Stress: Not on file  Social Connections: Not on file     Family History: The patient's family history includes Diabetes in his brother and mother.  ROS:   Please see the history of present illness.     All other systems reviewed and are negative.  EKGs/Labs/Other Studies Reviewed:    The following studies were reviewed today:   EKG:  EKG is  ordered today.  The ekg ordered today demonstrates  NSR, LAFB, anterior Q waves  09/07/2022- NSR with sinus arrhythmia , LAD  Recent Labs: 01/30/2022: Magnesium 1.9 10/23/2022: ALT 26 11/06/2022: Hemoglobin 14.9; Platelets 203 11/20/2022: B Natriuretic Peptide 360.2; BUN 28; Creatinine, Ser 1.25; Potassium 4.2; Sodium 137   Recent Lipid Panel    Component Value Date/Time   CHOL 99 (L) 01/07/2022 1203   TRIG  236 (H) 01/07/2022 1203   HDL 31 (L) 01/07/2022 1203   CHOLHDL 3.2 01/07/2022 1203   LDLCALC 31 01/07/2022 1203     Risk Assessment/Calculations:           Physical Exam:    VS:    Vitals:   01/07/23 1613  BP: 126/68  Pulse: 69  SpO2: 97%      Wt Readings from Last 3 Encounters:  01/07/23 148 lb 6.4 oz (67.3 kg)  12/14/22 149 lb 9.6 oz (67.9 kg)  11/20/22 151 lb 6.4 oz (68.7 kg)     GEN:  Well nourished, well developed in no acute distress HEENT: Normal NECK: No JVD; No carotid bruits LYMPHATICS: No lymphadenopathy CARDIAC: RRR, no murmurs, rubs, gallops RESPIRATORY:  Clear to auscultation without rales, wheezing or rhonchi  ABDOMEN: Soft, non-tender, non-distended MUSCULOSKELETAL:  No edema; No deformity  SKIN: Warm and dry NEUROLOGIC:  Alert and oriented x 3 PSYCHIATRIC:  Normal affect   ASSESSMENT:    #Ischemic CM NYHA Class II : TTE 30-35% 10/24/2022; unchanged.  Dry weight ~ 69 kg.   S/p ICD Medtronic DDD enrolled with remote device checks with Dr. Sallyanne Kemp. - euvolemic today - continue metoprolol 50 mg XL daily - continue jardiance 10 mg daily - cannot tolerate spironolactone ; has CKD stage IIIa and c/f type IV renal tubular acidosis - continue hydralazine 25 mg TID - working with Westley Hummer from [place] today to aid with getting entresto - continue lasix 40 mg daily  Paroxysmal Afib: new onset 11/30/2022. on eliquis and BB.   CAD: hx of anterior infarct. continue lipitor 20 mg daily. Continue BB. LDL 31 mg/dL 01/07/2022  PLAN:    In order of problems listed above:   Follow up in 6 months     Medication Adjustments/Labs and Tests Ordered: Current medicines are reviewed at length with the patient today.  Concerns regarding medicines are outlined  above.    Signed, Pedro Mayo, MD  01/07/2023 4:20 PM    [facility]

## 2023-01-07 NOTE — Patient Instructions (Addendum)
Medication Instructions:  Your physician recommends that you continue on your current medications as directed. Please refer to the Current Medication list given to you today.  *If you need a refill on your cardiac medications before your next appointment, please call your pharmacy*   Lab Work: Your physician recommends that you return as soon as possible to have the following labs drawn: BMET  If you have labs (blood work) drawn today and your tests are completely normal, you will receive your results only by: [place] (if you have MyChart) OR A paper copy in the mail If you have any lab test that is abnormal or we need to change your treatment, we will call you to review the results.   Testing/Procedures: NONE   Follow-Up: At [facility], you and your health needs are our priority.  As part of our continuing mission to provide you with exceptional heart care, we have created designated Provider Care Teams.  These Care Teams include your primary Cardiologist (physician) and Advanced Practice Providers (APPs -  Physician Assistants and Nurse Practitioners) who all work together to provide you with the care you need, when you need it.  We recommend signing up for the patient portal called "MyChart".  Sign up information is provided on this After Visit Summary.  MyChart is used to connect with patients for Virtual Visits (Telemedicine).  Patients are able to view lab/test results, encounter notes, upcoming appointments, etc.  Non-urgent messages can be sent to your provider as well.   To learn more about what you can do with MyChart, go to [email or web address].    Your next appointment:   6 month(s)  Provider:   Janina Mayo, MD

## 2023-01-08 ENCOUNTER — Other Ambulatory Visit: Payer: Self-pay

## 2023-01-11 ENCOUNTER — Other Ambulatory Visit: Payer: Self-pay

## 2023-01-11 ENCOUNTER — Telehealth: Payer: Self-pay | Admitting: Internal Medicine

## 2023-01-11 DIAGNOSIS — Z79899 Other long term (current) drug therapy: Secondary | ICD-10-CM

## 2023-01-11 NOTE — Telephone Encounter (Signed)
Paper Work Dropped Off: Patient Assistance Form   Date: 01/11/2023  Location of paper:  Provider Mailbox

## 2023-01-12 ENCOUNTER — Telehealth: Payer: Self-pay | Admitting: Licensed Clinical Social Worker

## 2023-01-12 LAB — BASIC METABOLIC PANEL WITH GFR
BUN/Creatinine Ratio: 29 — ABNORMAL HIGH (ref 10–24)
BUN: 39 mg/dL — ABNORMAL HIGH (ref 8–27)
CO2: 17 mmol/L — ABNORMAL LOW (ref 20–29)
Calcium: 9.3 mg/dL (ref 8.6–10.2)
Chloride: 101 mmol/L (ref 96–106)
Creatinine, Ser: 1.34 mg/dL — ABNORMAL HIGH (ref 0.76–1.27)
Glucose: 271 mg/dL — ABNORMAL HIGH (ref 70–99)
Potassium: 4.7 mmol/L (ref 3.5–5.2)
Sodium: 136 mmol/L (ref 134–144)
eGFR: 59 mL/min/1.73 — ABNORMAL LOW

## 2023-01-12 NOTE — Telephone Encounter (Signed)
H&V Care Navigation CSW Progress Note  Clinical Social Worker contacted caregiver by phone (pt sister [name], [place] on file, [phone] to f/u on paperwork brought to office. LCSW notes pt brought W2, signed letter from his nephew stating that he lives with them, [employer] (completed) and CAFA (not completed). NCMedAssist application completed but not completely sure it is accurately done. I discussed the above with pt sister assisted by [employer] [phone]. Pt sister aware, will try and help pt with getting the right documents. She requested that I mail CAFA back to her for pt to sign. She also asks that I call pt. LCSW has called pt at [phone], no answer. Left voicemail with assistance of [place], [place] [id number].  Patient is participating in a Managed Medicaid Plan:  No, self pay only, working on [place]: Food Insecurity Present (11/20/2022)  Housing: Low Risk  (10/26/2022)  Transportation Needs: Unmet Transportation Needs (11/20/2022)  Utilities: Not At Risk (11/20/2022)  Alcohol Screen: Low Risk  (10/26/2022)  Depression (PHQ2-9): Low Risk  (11/11/2022)  Financial Resource Strain: High Risk (11/20/2022)  Tobacco Use: Low Risk  (01/07/2023)    Westley Hummer, MSW, [place]  [phone]- work cell phone (preferred) [phone]- desk phone

## 2023-01-13 NOTE — Telephone Encounter (Signed)
This paperwork was given to Almedia Balls- please see alternate encounters.

## 2023-01-19 ENCOUNTER — Telehealth: Payer: Self-pay | Admitting: Licensed Clinical Social Worker

## 2023-01-20 NOTE — Telephone Encounter (Signed)
H&V Care Navigation CSW Progress Note  Clinical Social Worker contacted patient by phone to f/u on needed pay stubs. I received email from [facility] that they cannot process the application without that information. Entresto PAP and CAFA also aren't able to be processed without proof of income. LCSW has called and left voicemail again at [phone] with assistance of Diana, [place] [id number]. Will attempt again as able.   Patient is participating in a Managed Medicaid Plan:  No, self pay  [place]: Food Insecurity Present (11/20/2022)  Housing: Low Risk  (10/26/2022)  Transportation Needs: Unmet Transportation Needs (11/20/2022)  Utilities: Not At Risk (11/20/2022)  Alcohol Screen: Low Risk  (10/26/2022)  Depression (PHQ2-9): Low Risk  (11/11/2022)  Financial Resource Strain: High Risk (11/20/2022)  Tobacco Use: Low Risk  (01/07/2023)    Westley Hummer, MSW, [place]  [phone]- work cell phone (preferred) [phone]- desk phone

## 2023-01-28 ENCOUNTER — Telehealth: Payer: Self-pay | Admitting: Licensed Clinical Social Worker

## 2023-01-28 NOTE — Telephone Encounter (Signed)
H&V Care Navigation CSW Progress Note  Clinical Social Worker  emailed pt application  to [facility] for [employer] as we did not receive needed supporting documents but application seems to be completed. Team at [facility] shared they would reach out to pt to see if they could assist with enrolling him.  Patient is participating in a Managed Medicaid Plan:  No, self pay only.   SDOH Screenings   Food Insecurity: Food Insecurity Present (11/20/2022)  Housing: Low Risk  (10/26/2022)  Transportation Needs: Unmet Transportation Needs (11/20/2022)  Utilities: Not At Risk (11/20/2022)  Alcohol Screen: Low Risk  (10/26/2022)  Depression (PHQ2-9): Low Risk  (11/11/2022)  Financial Resource Strain: High Risk (11/20/2022)  Tobacco Use: Low Risk  (01/07/2023)   Octavio Graves, MSW, LCSW Clinical Social Worker II [facility] Heart/Vascular Care Navigation  [phone]- work cell phone (preferred) [phone]- desk phone

## 2023-01-28 NOTE — Telephone Encounter (Signed)
H&V Care Navigation CSW Progress Note  Clinical Social Worker contacted patient by phone to f/u again as paperwork remains incomplete. Pt did not answer, with assistance of [employer] #[id number] I left a message explaining that we still need three months of pay stubs to complete applications or proof of employment income. Pt had previously sent a W2 which was for 2023 and did not include any income from this year. LCSW has mailed pt back new applications and noted at this time pt will need to go through financial counseling department to submit assistance. Have been assisting pt since 2022, we have had difficulty getting needed documentation each time it has been provided despite our team sitting down with pt and interpreters to explain needed items. Most recently pt has met with/been contacted by HRT/VAS social work team 17 times this year. I have been successful in enrolling pt in Jardiance and Eliquis PAP but without three months pay stubs I cannot enroll pt in Entresto PAP. I have provided NCMedAssist application three times and reviewed with pt/pt sister, unfortunately we havent been able to receive a completed application with needed documentation each time. Pt CAFA has expired, was given new CAFA and OC applications also three times and we cannot submit without documentation needed.   I remain available to assist as able, limited without required proof of income documents.   Patient is participating in a Managed Medicaid Plan:  No, self pay.   SDOH Screenings   Food Insecurity: Food Insecurity Present (11/20/2022)  Housing: Low Risk  (10/26/2022)  Transportation Needs: Unmet Transportation Needs (11/20/2022)  Utilities: Not At Risk (11/20/2022)  Alcohol Screen: Low Risk  (10/26/2022)  Depression (PHQ2-9): Low Risk  (11/11/2022)  Financial Resource Strain: High Risk (11/20/2022)  Tobacco Use: Low Risk  (01/07/2023)   Octavio Graves, MSW, LCSW Clinical Social Worker II [facility]  Heart/Vascular Care Navigation  [phone]- work cell phone (preferred) [phone]- desk phone

## 2023-02-09 ENCOUNTER — Telehealth: Payer: Self-pay

## 2023-02-09 NOTE — Telephone Encounter (Signed)
Called patient to advise medication is at front desk to pick. Left message for patient to call office.

## 2023-02-10 ENCOUNTER — Telehealth: Payer: Self-pay | Admitting: Primary Care

## 2023-02-10 NOTE — Telephone Encounter (Signed)
Recv'd pt's [facility] OC app - called to sched financial counseling appt 

## 2023-02-11 ENCOUNTER — Encounter (HOSPITAL_COMMUNITY): Payer: Self-pay

## 2023-02-11 ENCOUNTER — Emergency Department (HOSPITAL_COMMUNITY): Payer: Self-pay

## 2023-02-11 ENCOUNTER — Other Ambulatory Visit: Payer: Self-pay | Admitting: Physician Assistant

## 2023-02-11 ENCOUNTER — Other Ambulatory Visit: Payer: Self-pay

## 2023-02-11 ENCOUNTER — Emergency Department (HOSPITAL_COMMUNITY)
Admission: EM | Admit: 2023-02-11 | Discharge: 2023-02-11 | Disposition: A | Discharge status: Home / Self Care | Payer: Self-pay | Attending: Emergency Medicine | Admitting: Emergency Medicine

## 2023-02-11 ENCOUNTER — Ambulatory Visit (INDEPENDENT_AMBULATORY_CARE_PROVIDER_SITE_OTHER): Payer: Self-pay | Admitting: Primary Care

## 2023-02-11 DIAGNOSIS — I480 Paroxysmal atrial fibrillation: Secondary | ICD-10-CM

## 2023-02-11 DIAGNOSIS — N183 Chronic kidney disease, stage 3 unspecified: Secondary | ICD-10-CM | POA: Insufficient documentation

## 2023-02-11 DIAGNOSIS — Z955 Presence of coronary angioplasty implant and graft: Secondary | ICD-10-CM | POA: Insufficient documentation

## 2023-02-11 DIAGNOSIS — I509 Heart failure, unspecified: Secondary | ICD-10-CM | POA: Insufficient documentation

## 2023-02-11 DIAGNOSIS — Z7901 Long term (current) use of anticoagulants: Secondary | ICD-10-CM | POA: Insufficient documentation

## 2023-02-11 DIAGNOSIS — I10 Essential (primary) hypertension: Secondary | ICD-10-CM

## 2023-02-11 DIAGNOSIS — R600 Localized edema: Secondary | ICD-10-CM | POA: Insufficient documentation

## 2023-02-11 DIAGNOSIS — E119 Type 2 diabetes mellitus without complications: Secondary | ICD-10-CM

## 2023-02-11 DIAGNOSIS — R0602 Shortness of breath: Secondary | ICD-10-CM | POA: Insufficient documentation

## 2023-02-11 DIAGNOSIS — Z950 Presence of cardiac pacemaker: Secondary | ICD-10-CM | POA: Insufficient documentation

## 2023-02-11 DIAGNOSIS — Z7982 Long term (current) use of aspirin: Secondary | ICD-10-CM | POA: Insufficient documentation

## 2023-02-11 DIAGNOSIS — R0789 Other chest pain: Secondary | ICD-10-CM | POA: Insufficient documentation

## 2023-02-11 DIAGNOSIS — I5042 Chronic combined systolic (congestive) and diastolic (congestive) heart failure: Secondary | ICD-10-CM

## 2023-02-11 DIAGNOSIS — E785 Hyperlipidemia, unspecified: Secondary | ICD-10-CM

## 2023-02-11 DIAGNOSIS — E1122 Type 2 diabetes mellitus with diabetic chronic kidney disease: Secondary | ICD-10-CM | POA: Insufficient documentation

## 2023-02-11 DIAGNOSIS — I2510 Atherosclerotic heart disease of native coronary artery without angina pectoris: Secondary | ICD-10-CM | POA: Insufficient documentation

## 2023-02-11 DIAGNOSIS — R079 Chest pain, unspecified: Secondary | ICD-10-CM

## 2023-02-11 DIAGNOSIS — I5043 Acute on chronic combined systolic (congestive) and diastolic (congestive) heart failure: Secondary | ICD-10-CM

## 2023-02-11 LAB — COMPREHENSIVE METABOLIC PANEL
ALT: 33 U/L (ref 0–44)
AST: 27 U/L (ref 15–41)
Albumin: 3.8 g/dL (ref 3.5–5.0)
Alkaline Phosphatase: 79 U/L (ref 38–126)
Anion gap: 13 (ref 5–15)
BUN: 24 mg/dL — ABNORMAL HIGH (ref 8–23)
CO2: 19 mmol/L — ABNORMAL LOW (ref 22–32)
Calcium: 9.2 mg/dL (ref 8.9–10.3)
Chloride: 109 mmol/L (ref 98–111)
Creatinine, Ser: 1.27 mg/dL — ABNORMAL HIGH (ref 0.61–1.24)
GFR, Estimated: >60 mL/min (ref >60)
Glucose, Bld: 166 mg/dL — ABNORMAL HIGH (ref 70–99)
Potassium: 4.6 mmol/L (ref 3.5–5.1)
Sodium: 141 mmol/L (ref 135–145)
Total Bilirubin: 0.7 mg/dL (ref 0.3–1.2)
Total Protein: 7 g/dL (ref 6.5–8.1)

## 2023-02-11 LAB — CBC WITH DIFFERENTIAL/PLATELET
Abs Immature Granulocytes: 0.04 10*3/uL (ref 0.00–0.07)
Basophils Absolute: 0.1 10*3/uL (ref 0.0–0.1)
Basophils Relative: 1 %
Eosinophils Absolute: 2.5 10*3/uL — ABNORMAL HIGH (ref 0.0–0.5)
Eosinophils Relative: 18 %
HCT: 44.1 % (ref 39.0–52.0)
Hemoglobin: 14.5 g/dL (ref 13.0–17.0)
Immature Granulocytes: 0 %
Lymphocytes Relative: 12 %
Lymphs Abs: 1.6 10*3/uL (ref 0.7–4.0)
MCH: 29.3 pg (ref 26.0–34.0)
MCHC: 32.9 g/dL (ref 30.0–36.0)
MCV: 89.1 fL (ref 80.0–100.0)
Monocytes Absolute: 0.5 10*3/uL (ref 0.1–1.0)
Monocytes Relative: 4 %
Neutro Abs: 9 10*3/uL — ABNORMAL HIGH (ref 1.7–7.7)
Neutrophils Relative %: 65 %
Platelets: 133 10*3/uL — ABNORMAL LOW (ref 150–400)
RBC: 4.95 MIL/uL (ref 4.22–5.81)
RDW: 14 % (ref 11.5–15.5)
WBC: 13.7 10*3/uL — ABNORMAL HIGH (ref 4.0–10.5)
nRBC: 0 % (ref 0.0–0.2)

## 2023-02-11 LAB — TROPONIN I (HIGH SENSITIVITY)
Troponin I (High Sensitivity): 23 ng/L — ABNORMAL HIGH (ref <18)
Troponin I (High Sensitivity): 37 ng/L — ABNORMAL HIGH (ref <18)

## 2023-02-11 LAB — PATHOLOGIST SMEAR REVIEW: Path Review: REACTIVE

## 2023-02-11 LAB — BRAIN NATRIURETIC PEPTIDE: B Natriuretic Peptide: 857.7 pg/mL — ABNORMAL HIGH (ref 0.0–100.0)

## 2023-02-11 MED ORDER — APIXABAN 5 MG PO TABS: 5.0000 mg | ORAL_TABLET | Freq: Two times a day (BID) | ORAL | 0 refills | Status: DC

## 2023-02-11 MED ORDER — FUROSEMIDE 40 MG PO TABS
40.0000 mg | ORAL_TABLET | Freq: Every day | ORAL | 0 refills | Status: DC
Start: 2023-02-11 — End: 2023-02-22

## 2023-02-11 MED ORDER — FUROSEMIDE 10 MG/ML IJ SOLN
40.0000 mg | Freq: Once | INTRAMUSCULAR | Status: AC
  Administered 2023-02-11: 40 mg via INTRAVENOUS
  Filled 2023-02-11: qty 4

## 2023-02-11 MED ORDER — ATORVASTATIN CALCIUM 20 MG PO TABS
20.0000 mg | ORAL_TABLET | Freq: Every day | ORAL | 3 refills | Status: DC
Start: 2023-02-11 — End: 2023-08-26

## 2023-02-11 MED ORDER — ASPIRIN 81 MG PO CHEW
324.0000 mg | CHEWABLE_TABLET | Freq: Once | ORAL | Status: AC
  Administered 2023-02-11: 324 mg via ORAL
  Filled 2023-02-11: qty 4

## 2023-02-11 MED ORDER — NITROGLYCERIN 0.4 MG SL SUBL: 0.4000 mg | SUBLINGUAL_TABLET | SUBLINGUAL | Status: DC | PRN

## 2023-02-11 NOTE — ED Provider Notes (Signed)
[place] EMERGENCY DEPARTMENT AT [facility] Provider Note   CSN: [id number] Arrival date & time: 02/11/23  0403     History  Chief Complaint  Patient presents with   Shortness of Breath    Pedro Kemp is a 67 y.o. male.  67 yo M with a chief complaint of difficulty breathing.  He tells me this started about 2 AM and woke him up from sleep.  Feels a little bit like when he had a heart attack in the past as some pressure on his chest and has ongoing difficulty breathing.  He denies any issues yesterday.  Denies any significant swelling.  Denies cough or congestion or fever.     Shortness of Breath      Home Medications Prior to Admission medications   Medication Sig Start Date End Date Taking? Authorizing Provider  apixaban (ELIQUIS) 5 MG TABS tablet Take 1 tablet (5 mg total) by mouth 2 (two) times daily. 11/09/22   Croitoru, Mihai, MD  aspirin EC 81 MG tablet Take 81 mg by mouth daily. Swallow whole.    [provider]  atorvastatin (LIPITOR) 20 MG tablet Take 1 tablet (20 mg total) by mouth daily. 11/06/22   Robbie Lis M, PA-C  empagliflozin (JARDIANCE) 10 MG TABS tablet Take 1 tablet (10 mg total) by mouth daily before breakfast. 11/11/22   Grayce Sessions, NP  furosemide (LASIX) 40 MG tablet Take 1 tablet (40 mg total) by mouth daily. 12/08/22   Marinus Maw, MD  hydrALAZINE (APRESOLINE) 25 MG tablet Take 1 tablet (25 mg total) by mouth 3 (three) times daily. 11/20/22   Alen Bleacher, NP  metFORMIN (GLUCOPHAGE-XR) 500 MG 24 hr tablet Take 1 tablet (500 mg total) by mouth 2 (two) times daily with a meal. 11/11/22   Grayce Sessions, NP  metoprolol succinate (TOPROL-XL) 50 MG 24 hr tablet Take 1 tablet (50 mg total) by mouth daily. Take with or immediately following a meal. 12/14/22 12/09/23  Croitoru, Rachelle Hora, MD      Allergies    Patient has no known allergies.    Review of Systems   Review of Systems  Respiratory:  Positive for  shortness of breath.     Physical Exam Updated Vital Signs BP (!) 143/75   Pulse 61   Temp 97.8 F (36.6 C) (Oral)   Resp 13   SpO2 98%  Physical Exam Vitals and nursing note reviewed.  Constitutional:      Appearance: He is well-developed.  HENT:     Head: Normocephalic and atraumatic.  Eyes:     Pupils: Pupils are equal, round, and reactive to light.  Neck:     Vascular: No JVD.  Cardiovascular:     Rate and Rhythm: Normal rate and regular rhythm.     Heart sounds: No murmur heard.    No friction rub. No gallop.  Pulmonary:     Effort: No respiratory distress.     Breath sounds: No wheezing.  Abdominal:     General: There is no distension.     Tenderness: There is no abdominal tenderness. There is no guarding or rebound.  Musculoskeletal:        General: Normal range of motion.     Cervical back: Normal range of motion and neck supple.     Right lower leg: Edema present.     Left lower leg: Edema present.     Comments: Trace edema bilaterally  Skin:  Coloration: Skin is not pale.     Findings: No rash.  Neurological:     Mental Status: He is alert and oriented to person, place, and time.  Psychiatric:        Behavior: Behavior normal.     ED Results / Procedures / Treatments   Labs (all labs ordered are listed, but only abnormal results are displayed) Labs Reviewed  COMPREHENSIVE METABOLIC PANEL - Abnormal; Notable for the following components:      Result Value   CO2 19 (*)    Glucose, Bld 166 (*)    BUN 24 (*)    Creatinine, Ser 1.27 (*)    All other components within normal limits  CBC WITH DIFFERENTIAL/PLATELET - Abnormal; Notable for the following components:   WBC 13.7 (*)    Platelets 133 (*)    Neutro Abs 9.0 (*)    Eosinophils Absolute 2.5 (*)    All other components within normal limits  BRAIN NATRIURETIC PEPTIDE - Abnormal; Notable for the following components:   B Natriuretic Peptide 857.7 (*)    All other components within normal  limits  TROPONIN I (HIGH SENSITIVITY) - Abnormal; Notable for the following components:   Troponin I (High Sensitivity) 23 (*)    All other components within normal limits  TROPONIN I (HIGH SENSITIVITY) - Abnormal; Notable for the following components:   Troponin I (High Sensitivity) 37 (*)    All other components within normal limits  PATHOLOGIST SMEAR REVIEW    EKG EKG Interpretation  Date/Time:  Thursday February 11 2023 07:04:41 EDT Ventricular Rate:  62 PR Interval:  169 QRS Duration: 122 QT Interval:  419 QTC Calculation: 426 R Axis:   -39 Text Interpretation: Sinus rhythm Atrial premature complex Nonspecific IVCD with LAD LVH with secondary repolarization abnormality Anterior Q waves, possibly due to LVH No significant change since last tracing Confirmed by Melene Plan [phone]) on 02/11/2023 7:10:36 AM  Radiology DG Chest 2 View  Result Date: 02/11/2023 CLINICAL DATA:  Shortness of breath. EXAM: CHEST - 2 VIEW COMPARISON:  11/06/2022 FINDINGS: The lungs are clear without focal pneumonia, edema, pneumothorax or pleural effusion. The cardiopericardial silhouette is within normal limits for size. Left-sided pacer/AICD again noted. No acute bony abnormality. IMPRESSION: No active cardiopulmonary disease. Electronically Signed   By: Kennith Center M.D.   On: 02/11/2023 05:04    Procedures .Critical Care  Performed by: Melene Plan, DO Authorized by: Melene Plan, DO   Critical care provider statement:    Critical care time (minutes):  35   Critical care time was exclusive of:  Separately billable procedures and treating other patients   Critical care was time spent personally by me on the following activities:  Development of treatment plan with patient or surrogate, discussions with consultants, evaluation of patient's response to treatment, examination of patient, ordering and review of laboratory studies, ordering and review of radiographic studies, ordering and performing treatments and  interventions, pulse oximetry, re-evaluation of patient's condition and review of old charts   Care discussed with: admitting provider       Medications Ordered in ED Medications  nitroGLYCERIN (NITROSTAT) SL tablet 0.4 mg (has no administration in time range)  furosemide (LASIX) injection 40 mg (has no administration in time range)  aspirin chewable tablet 324 mg (324 mg Oral Given 02/11/23 0509)    ED Course/ Medical Decision Making/ A&P  Medical Decision Making Risk OTC drugs. Prescription drug management.   67 yo M with a cc of chest pain.  Going on for the past 3 hours.  Feels like when he had an MI in the past.  On record review the patient had a an MI in 2022 at an outside hospital.  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  He is moved to the area now is seeing cardiology locally.  Will obtain a laboratory evaluation.  Troponin.  Patient's delta is greater than 10.  His BNP is above his baseline.  Given bolus dose of Lasix.  On repeat assessment the patient is feeling much better after nitroglycerin and aspirin.  Plan to discuss with cardiology.  Patient signed out to Dr. Hyacinth Meeker, please see their note for further details of care in the ED.  The patients results and plan were reviewed and discussed.   Any x-rays performed were independently reviewed by myself.   Differential diagnosis were considered with the presenting HPI.  Medications  nitroGLYCERIN (NITROSTAT) SL tablet 0.4 mg (has no administration in time range)  furosemide (LASIX) injection 40 mg (has no administration in time range)  aspirin chewable tablet 324 mg (324 mg Oral Given 02/11/23 0509)    Vitals:   02/11/23 0545 02/11/23 0600 02/11/23 0615 02/11/23 0630  BP: 137/75 (!) 140/77 (!) 149/74 (!) 143/75  Pulse: 66 63 60 61  Resp: 16 18 13 13   Temp:      TempSrc:      SpO2: 93% 97% 98% 98%    Final diagnoses:  Chest pain with high risk for cardiac etiology     Admission/ observation were discussed with the admitting physician, patient and/or family and they are comfortable with the plan.          Final Clinical Impression(s) / ED Diagnoses Final diagnoses:  Chest pain with high risk for cardiac etiology    Rx / DC Orders ED Discharge Orders     None         Melene Plan, DO 02/11/23 [phone]

## 2023-02-11 NOTE — ED Provider Notes (Signed)
  Physical Exam  BP (!) 143/84   Pulse 61   Temp 98.2 F (36.8 C) (Oral)   Resp 13   Ht  (1.6 m)   Wt 63.5 kg   SpO2 91%   BMI 24.80 kg/m   Physical Exam  Procedures  Procedures  ED Course / MDM    Medical Decision Making Care assumed at 3 pm.  Patient is here with some chest pressure.  Patient apparently had V-fib arrest previously and now has ICD.  Signed out pending cardiology consult and interrogation of the pacemaker  4:07 PM Per cardiology, there were no events on the pacemaker.  Troponins went from 23-37.  His BNP is a 57.  He diuresed about 2 L after Lasix.  He has follow-up with Dr. Wyline Mood on 4/30.  Cardiology cleared patient from cardiac standpoint.  Problems Addressed: Chest pain, unspecified type: acute illness or injury  Amount and/or Complexity of Data Reviewed Labs: ordered. Decision-making details documented in ED Course. Radiology: ordered and independent interpretation performed. Decision-making details documented in ED Course.  Risk OTC drugs. Prescription drug management. Decision regarding hospitalization.          Charlynne Pander, MD 02/11/23 [phone]

## 2023-02-11 NOTE — ED Notes (Signed)
PA Meng notified and aware that pacemaker has been interrogated. Awaiting report.

## 2023-02-11 NOTE — ED Provider Notes (Signed)
Discussed the case with cardiology who will come to see the patient, Dr. Cristal Deer recommends against heparin at this time, the patient does take Pervis Hocking, MD 02/11/23 [phone]

## 2023-02-11 NOTE — Consult Note (Addendum)
Cardiology Consultation   Patient ID: Pedro Kemp MRN: [id number]; DOB: [birth year]  Admit date: 02/11/2023 Date of Consult: 02/11/2023  PCP:  Grayce Sessions, NP   [facility] Providers Cardiologist:  Maisie Fus, MD        Patient Profile:   Pedro Kemp is a 67 y.o. 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 Adela Lank.  History of Present Illness:   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  He was admitted at [facility] in July 2022 with NSTEMI and acute systolic CHF with EF 35%.  Troponin was up to 15,000.  Cardiac catheterization performed on 05/14/2021 revealed 100% occluded mid LAD treated with shockwave and PCI.  He also had disease in his diagonal status post PCI.  He subsequently had a VT arrest on 05/16/2021 with ACLS and ROSC.  He underwent relook cath on 05/16/2021 which revealed patent stent.  Cardiac arrest was caused by scar mediated VT.  He underwent EP study on 04/19/2021 with inducible sustained monomorphic VT, this was treated with amiodarone and he subsequently underwent ICD implantation.  Postprocedure, he developed a large hematoma at the ICD site.  He was ultimately discharged on aspirin, Plavix, Lipitor, lisinopril, Lasix and metoprolol succinate.  Jardiance was later added.  He was initially followed by Dr. Merrily Pew of general cardiology and Dr. Lanae Boast of EP, however later transferred care to [facility].  He was admitted in January 2024 with decompensated heart failure and pulmonary edema.  Spironolactone was discontinued due to CKD and hyperkalemia.  Echo showed EF 30-35%, moderately to severely  dilated LV, mild to moderate MR. He was seen by Dr. Royann Shivers February 2024.  The device interrogation revealed 3 separate episodes of atrial fibrillation with total A-fib burden of 4 hours in 3 weeks.  OptiVol was also trending up suggesting volume overload.  Patient was struggling to obtain Entresto at the time through medication assistance.  Since then, he has been placed on Eliquis and has become more compliant with his medication.  He was last seen by Dr. Carolan Clines on 01/07/2023 at which time he was doing well.  Over the past several weeks, he has been having shortness of breath with exertion, recently he also developed orthopnea and PND symptom.  He woke up around 2 AM this morning with complaint of epigastric pressure.  He denies any chest pain.  Symptom lasted 2 hours before going away around 4 AM.  Due to similarity with his previous symptoms in 2022 hospitalization, he decided to seek urgent medical attention.  On arrival to [facility], ED, his blood pressure was 163/97.  Heart rate 101.  EKG demonstrated the patient was in atrial fibrillation with RVR.  Significant blood work include creatinine of 1.27.  Serial troponin 23--> 37.  BNP elevated 857.7 which is higher than the previous 360 in February.  White blood cell count 13.7.  Chest x-ray showed no acute finding.  By the time he was hooked up to telemetry, he was already back in sinus rhythm.  I reviewed his home medication with the patient, he has been compliant with all medication except for Jardiance which  he says he has completed a course and also aspirin which he says he was taken off.  Based on Dr. Erin Hearing note, it does appear aspirin has been taking of after addition of Eliquis, however it was never removed from the medication list.  He has been given 324 mg aspirin in the ED and also 40 mg IV Lasix.  So far he has urinated more than 2 L of fluid and his symptom has resolved.   Past Medical History:  Diagnosis Date   Cardiac arrest  [facility])    CHF (congestive heart failure) (HCC)    Coronary artery disease    Diabetes mellitus without complication (HCC)    H/O heart artery stent    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    Past Surgical History:  Procedure Laterality Date   CARDIAC CATHETERIZATION     ICD IMPLANT       Home Medications:  Prior to Admission medications   Medication Sig Start Date End Date Taking? Authorizing Provider  metFORMIN (GLUCOPHAGE-XR) 500 MG 24 hr tablet Take 1 tablet (500 mg total) by mouth 2 (two) times daily with a meal. 11/11/22  Yes Grayce Sessions, NP  apixaban (ELIQUIS) 5 MG TABS tablet Take 1 tablet (5 mg total) by mouth 2 (two) times daily. Patient not taking: Reported on 02/11/2023 11/09/22   Croitoru, Rachelle Hora, MD  aspirin EC 81 MG tablet Take 81 mg by mouth daily. Swallow whole. Patient not taking: Reported on 02/11/2023    [provider]  atorvastatin (LIPITOR) 20 MG tablet Take 1 tablet (20 mg total) by mouth daily. Patient not taking: Reported on 02/11/2023 11/06/22   Robbie Lis M, PA-C  empagliflozin (JARDIANCE) 10 MG TABS tablet Take 1 tablet (10 mg total) by mouth daily before breakfast. Patient not taking: Reported on 02/11/2023 11/11/22   Grayce Sessions, NP  furosemide (LASIX) 40 MG tablet Take 1 tablet (40 mg total) by mouth daily. Patient not taking: Reported on 02/11/2023 12/08/22   Marinus Maw, MD  hydrALAZINE (APRESOLINE) 25 MG tablet Take 1 tablet (25 mg total) by mouth 3 (three) times daily. Patient not taking: Reported on 02/11/2023 11/20/22   Alen Bleacher, NP  metoprolol succinate (TOPROL-XL) 50 MG 24 hr tablet Take 1 tablet (50 mg total) by mouth daily. Take with or immediately following a meal. Patient not taking: Reported on 02/11/2023 12/14/22 12/09/23  Croitoru, Rachelle Hora, MD    Inpatient Medications: Scheduled Meds:  Continuous Infusions:  PRN Meds: nitroGLYCERIN  Allergies:   No Known Allergies  Social  History:   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  Tobacco Use   Smoking status: Never    Passive exposure: Never   Smokeless tobacco: Never  Vaping Use   Vaping Use: Never used  Substance and Sexual Activity   Alcohol use: Never   Drug use: Never   Sexual activity: Not Currently    Partners: Female  Other Topics Concern   Not on file  Social History Narrative   Not on file   Social Determinants of Health   Financial Resource Strain: High Risk (11/20/2022)   Overall Financial Resource Strain (CARDIA)    Difficulty of Paying Living Expenses: Very  hard  Food Insecurity: Food Insecurity Present (11/20/2022)   Hunger Vital Sign    Worried About Running Out of Food in the Last Year: Sometimes true    Ran Out of Food in the Last Year: Sometimes true  Transportation Needs: Unmet Transportation Needs (11/20/2022)   PRAPARE - Administrator, Civil Service (Medical): Yes    Lack of Transportation (Non-Medical): Yes  Physical Activity: Not on file  Stress: Not on file  Social Connections: Not on file  Intimate Partner Violence: Not At Risk (10/26/2022)   Humiliation, Afraid, Rape, and Kick questionnaire    Fear of Current or Ex-Partner: No    Emotionally Abused: No    Physically Abused: No    Sexually Abused: No    Family History:    Family History  Problem Relation Age of Onset   Diabetes Mother    Diabetes Brother      ROS:  Please see the history of present illness.   All other ROS reviewed and negative.     Physical Exam/Data:   Vitals:   02/11/23 0745 02/11/23 1045 02/11/23 1145 02/11/23 1152  BP: (!) 151/80 (!) 144/77 (!) 140/97   Pulse: 60 60 62   Resp: 12 14 12    Temp: 98 F (36.7 C)   98.2 F (36.8 C)  TempSrc: Oral   Oral  SpO2: 97% 96% 95%    Weight: 63.5 kg     Height: 5\' 3"  (1.6 m)       Intake/Output Summary (Last 24 hours) at 02/11/2023 1246 Last data filed at 02/11/2023 1201 Gross per 24 hour  Intake --  Output 1000 ml  Net -1000 ml      02/11/2023    7:45 AM 01/07/2023    4:13 PM 12/14/2022    1:34 PM  Last 3 Weights  Weight (lbs) 140 lb 148 lb 6.4 oz 149 lb 9.6 oz  Weight (kg) 63.504 kg 67.314 kg 67.858 kg     Body mass index is 24.8 kg/m.  General:  Well nourished, well developed, in no acute distress HEENT: normal Neck: no JVD Vascular: No carotid bruits; Distal pulses 2+ bilaterally Cardiac:  normal S1, S2; RRR; no murmur  Lungs:  clear to auscultation bilaterally, no wheezing, rhonchi or rales  Abd: soft, nontender, no hepatomegaly  Ext: no edema Musculoskeletal:  No deformities, BUE and BLE strength normal and equal Skin: warm and dry  Neuro:  CNs 2-12 intact, no focal abnormalities noted Psych:  Normal affect   EKG:  The EKG was personally reviewed and demonstrates: Initial EKG showed atrial fibrillation with RVR, subsequent EKG showed sinus rhythm with T wave inversion in the lateral leads. Telemetry:  Telemetry was personally reviewed and demonstrates: Normal sinus rhythm without recurrence of A-fib.  Relevant CV Studies:  Echo 10/24/2022  1. Left ventricular ejection fraction, by estimation, is 30 to 35%. The  left ventricle has moderately decreased function. The left ventricle  demonstrates global hypokinesis. The left ventricular internal cavity size  was moderately to severely dilated.  Left ventricular diastolic parameters are indeterminate.   2. Right ventricular systolic function is normal. The right ventricular  size is normal. There is normal pulmonary artery systolic pressure.   3. Left atrial size was moderately dilated.   4. The mitral valve is normal in structure. Mild to moderate mitral valve  regurgitation. No evidence of mitral stenosis.   5. The aortic valve is tricuspid.  Aortic valve regurgitation is not  visualized.  No aortic stenosis is present.   Comparison(s): Prior images reviewed side by side.   Conclusion(s)/Recommendation(s): EF remains reduced, with severe dilation  of LV. MR better appreciated on current study.    Laboratory Data:  High Sensitivity Troponin:   Recent Labs  Lab 02/11/23 0439 02/11/23 0613  TROPONINIHS 23* 37*     Chemistry Recent Labs  Lab 02/11/23 0439  NA 141  K 4.6  CL 109  CO2 19*  GLUCOSE 166*  BUN 24*  CREATININE 1.27*  CALCIUM 9.2  GFRNONAA >60  ANIONGAP 13    Recent Labs  Lab 02/11/23 0439  PROT 7.0  ALBUMIN 3.8  AST 27  ALT 33  ALKPHOS 79  BILITOT 0.7   Lipids No results for input(s): "CHOL", "TRIG", "HDL", "LABVLDL", "LDLCALC", "CHOLHDL" in the last 168 hours.  Hematology Recent Labs  Lab 02/11/23 0439  WBC 13.7*  RBC 4.95  HGB 14.5  HCT 44.1  MCV 89.1  MCH 29.3  MCHC 32.9  RDW 14.0  PLT 133*   Thyroid No results for input(s): "TSH", "FREET4" in the last 168 hours.  BNP Recent Labs  Lab 02/11/23 0439  BNP 857.7*    DDimer No results for input(s): "DDIMER" in the last 168 hours.   Radiology/Studies:  DG Chest 2 View  Result Date: 02/11/2023 CLINICAL DATA:  Shortness of breath. EXAM: CHEST - 2 VIEW COMPARISON:  11/06/2022 FINDINGS: The lungs are clear without focal pneumonia, edema, pneumothorax or pleural effusion. The cardiopericardial silhouette is within normal limits for size. Left-sided pacer/AICD again noted. No acute bony abnormality. IMPRESSION: No active cardiopulmonary disease. Electronically Signed   By: Kennith Center M.D.   On: 02/11/2023 05:04     Assessment and Plan:   Abdominal pressure  -Symptom likely due to combination of mild volume overload and A-fib with RVR.  Woke the patient up from sleep around 2 AM this morning and resolved by 4 AM.  Serial troponin borderline elevated however remained flat.  Denies any chest pain, location of the abdominal  pressures in the epigastric area.  -Symptom went away around 4 AM after the patient converted back to sinus rhythm.  He has received 40 mg IV Lasix with at least 2 L of urine output.  Currently his symptom has completely resolved.  Despite medication reconciliation mentioning patient was not taking any of his medications, he says he has been compliant with all medication except for Jardiance and aspirin.  Based on previous cardiology note, aspirin was supposed to be removed from his medication list.  -Pending device interrogation to look at A-fib burden and OptiVol reading to assess volume.  Addendum: device interrogation reviewed.  Case has been discussed with Dr. Servando Salina has seen the patient.  He is stable for discharge from a cardiac perspective.  Close outpatient follow-up has been arranged.  CAD: Prior history of MI complicated by VT cardiac arrest in 2022.  He previously underwent PCI DES to occluded LAD in July 2022 at [facility], 2 days after intervention he developed VT arrest, relook cath showed patent stent.  VT arrest felt to be mediated by scar tissue.  He later underwent ICD.  Denies any exertional chest pain however does have worsening shortness of breath with exertion lately.  Ischemic cardiomyopathy s/p Medtronic ICD: Chest x-ray normal, however patient symptom is concerning for mild volume overload.  He says he has been compliant with Lasix, however has not taken any Jardiance lately as he thought he has completed the course.  Will need to resume Jardiance.  He received 40 mg IV Lasix, and put out 2 L.  He appears to be euvolemic on exam.  PAF: Initial EKG on arrival to ED this morning showed atrial fibrillation with RVR, by the time she was hooked up to telemetry, he has been back into sinus rhythm.  Subsequent EKG also confirmed sinus rhythm.  Based on previously device interrogation, he has paroxysmal A-fib but does not stay in A-fib for long.  I suspect his A-fib with RVR likely  contributed to some of his symptoms this morning.  Hyperlipidemia: On Lipitor  DM2  CKD stage III: Stable renal function  Risk Assessment/Risk Scores:     New York Heart Association (NYHA) Functional Class NYHA Class IV  CHA2DS2-VASc Score = 4   This indicates a 4.8% annual risk of stroke. The patient's score is based upon: CHF History: 1 HTN History: 0 Diabetes History: 1 Stroke History: 0 Vascular Disease History: 1 Age Score: 1 Gender Score: 0   For questions or updates, please contact  [facility] Please consult [email or web address] for contact info under  Signed, Azalee Course, [place]  02/11/2023 12:46 PM  Patient seen and examined, note reviewed with the signed Advanced Practice Provider. I personally reviewed laboratory data, imaging studies and relevant notes. I independently examined the patient and formulated the important aspects of the plan. I have personally discussed the plan with the patient and/or family. Comments or changes to the note/plan are indicated below.  He has had some clinical improvement with the Lasix.,at a negative balance. No significant arrhythmia on the device interrogation report.  Back in sinus rhythm continue his rate control agents with eliquis. From a CV standpoint he can be discharged to home. He has no objections for being discharged at this time. He reports he feels much more improved.  Visit facilitated by our interpreter service [phone])    Thomasene Ripple DO, MS [facility] Attending Cardiologist [facility]  [address] [place], [place] [address] [phone] Website: [email or web address]

## 2023-02-11 NOTE — ED Provider Triage Note (Signed)
Emergency Medicine Provider Triage Evaluation Note  Pedro Kemp , a 67 y.o. male  was evaluated in triage.  Pt complains of shortness of breath.  Patient states that symptoms woke him from sleep around 2 to 3 AM this morning.  Denies chest pain.  States he feels like prior history of heart failure exacerbation.  Denies fever, cough, congestion, abdominal pain, nausea, vomiting.  Reports compliance with his at home medication..  Review of Systems  Positive: See above Negative:   Physical Exam  BP (!) 163/97 (BP Location: Right Arm)   Pulse (!) 101   Temp 97.8 F (36.6 C) (Oral)   Resp 18   SpO2 96%  Gen:   Awake, no distress   Resp:  Normal effort  MSK:   Moves extremities without difficulty  Other:    Medical Decision Making  Medically screening exam initiated at 4:34 AM.  Appropriate orders placed.  Demere [name] was informed that the remainder of the evaluation will be completed by another provider, this initial triage assessment does not replace that evaluation, and the importance of remaining in the ED until their evaluation is complete.     Peter Garter, [place] 02/11/23 [phone]

## 2023-02-11 NOTE — Discharge Instructions (Addendum)
You were seen by cardiology today.  They want you to continue Lasix 40 mg daily and your Eliquis.  You have follow-up with Dr. Wyline Mood on April 30  Return to ER if you have worse chest pain or shortness of breath or palpitations

## 2023-02-11 NOTE — ED Triage Notes (Addendum)
PT reports SOB due to CHF. Symptoms started during the night. Pt has been taking meds. Dyspnea at rest. Not on home oxygen. Denies leg swelling.

## 2023-02-12 ENCOUNTER — Other Ambulatory Visit: Payer: Self-pay

## 2023-02-16 ENCOUNTER — Ambulatory Visit: Payer: Self-pay | Attending: Internal Medicine | Admitting: Internal Medicine

## 2023-02-16 NOTE — Progress Notes (Deleted)
Cardiology Office Note:    Date:  02/16/2023   ID:  Pedro Kemp, DOB 1956-01-22, MRN [id number]  PCP:  Grayce Sessions, NP   [facility] Providers Cardiologist:  Maisie Fus, MD     Referring MD: Grayce Sessions, NP   No chief complaint on file.  Post Cardiac Arrest  History of Present Illness:    Pedro Kemp is a 67 y.o. male with a hx of T2DM, HLD, CKD stage IIIa, Ischemic CM,  spanish speaking requires an interpreter, referral for post hospitalization at [facility] for ischemic VT and cardiac arrest (interpretor Kandis Mannan #[id number])   He was admitted at [facility] 05/14/2021 to 05/22/2021.[name] presented to [facility] with chest pain, noted to have high risk NSTEMI and acute CHF with EF 35%. He had troponin up to 15 thousand. He had cath on 7/27 that showed 100% occluded mid LAD s/p shockwave and PCI. He also had disease in his diag s/p PCI. He subsequently had VT arrest 7/29 with ACLS and ROSC. He went back to the cath lab on 7/29 and stent was patent. This was concerning for scar mediated VT.  He had an EP study on 8/2 with inducible sustained monomorphic VT.  Managed with amiodarone.He underwent DC-ICD.  He had a moderate sized hematoma at the ICD site requiring monitoring.  He was also managed for acute respiratory failure 2/2 CHF exacerbation. Crt on discharge was 1.38. A1c was 9 %. He was discharged on asa 81 mg daily,  atorvastatin 80 mg daily, plavix 75 mg daily, lisinopril 20 mg daily, lasix 80 mg daily, metop XL 50 mg daily. Started on Jardiance 10 mg. His A1c is 7.5 %   He had appointments for cardiac rehab, with cardiology at [facility]: Dr Merrily Pew. ICD check with Dr. Lanae Boast in August.   He states that he did not do exercise afterwards. After he left the hospital, he had his ICD checked. He would like to have his care here. He reports no shocks. He has no swelling or pain at the site. He denies exertional chest pain or dyspnea. He denies orthopnea, PND, no LE edema. During the day  , he does some work around the house.  Prior to his hospitalization , he had no problems with the heart. He does not smoke.  He denies palpitations, LH. He gets some dizziness. His vision gets blurry. He has no hx of syncope. He is not working will need medication assistance.  Interim Hx: He says that his medications ran out. He did not return to the pharmacy for refills. He feels well. He has no chest pain or dyspnea on exertion. He denies orthopnea,  PND, or LE edema. He can do his activities without any issues.  Interim Hx 09/07/2022 Went to the ED on September, 5th for vague chest pressure. Felt like heartburn. ECG was atrial paced. He feels better. No heartburn. He notes some SOB only at night. He also notes his ICD is making noise  Wt Readings from Last 3 Encounters:  02/11/23 140 lb (63.5 kg)  01/07/23 148 lb 6.4 oz (67.3 kg)  12/14/22 149 lb 9.6 oz (67.9 kg)   Interim hx 01/07/2023 Hospitalized in early Jan for decompensated CHF. P/w pulmonary edema. In the setting of not taking his medications. He was diuresed. He was discharged on metop XL 50 mg d, entresto 24-26 mg daily, spironolactone 25 mg daily. He was discharged. On Jan 19, his BMET was checked and he had K of 5.7. Spironolactone  was stopped.  He saw Dr. Royann Shivers in January and Feb 2024. Noted [name] has not been performing remote ICD transmissions. He interrogated his device which showed evidence of paroxysmal atrial fibrillation fibrillation: 3 separate episodes that all occurred on February 12 lasting for about an hour. Overall burden of A-fib in the last 3 weeks is a total of 4 hours, with significant RVR, the final episode may have been a dual tachycardia with a period of VT. OptiVol was still trending up. Patient has struggled with getting entresto, 2/2 not providing paystubs for patient assistance and not taking metoprolol for unclear reason. He was asymptomatic at that time. He otherwise was started on  eliquis.  Today, he feels well. Notes he is taking the medications on his list. Pending entresto.He denies angina, dyspnea on exertion, lower extremity edema, PND or orthopnea.    Wt Readings from Last 3 Encounters:  02/11/23 140 lb (63.5 kg)  01/07/23 148 lb 6.4 oz (67.3 kg)  12/14/22 149 lb 9.6 oz (67.9 kg)     Interim Hx 02/16/2023 He went to the ED 2/2 difficulty with breathing in the early AM.  BNP 857. Xray did not show pulmonary edema.  Was euvolemic on exam.    Past Medical History:  Diagnosis Date   Cardiac arrest (HCC)    CHF (congestive heart failure) (HCC)    Coronary artery disease    Diabetes mellitus without complication (HCC)    H/O heart artery stent    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    Past Surgical History:  Procedure Laterality Date   CARDIAC CATHETERIZATION     ICD IMPLANT      Current Medications: No outpatient medications have been marked as taking for the 02/16/23 encounter (Appointment) with Maisie Fus, MD.     Allergies:   Patient has no known allergies.   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  Tobacco Use   Smoking status: Never    Passive exposure: Never   Smokeless tobacco: Never  Vaping Use   Vaping Use: Never used  Substance and Sexual Activity   Alcohol use: Never   Drug use: Never   Sexual activity: Not Currently    Partners: Female  Other Topics Concern   Not on file  Social History Narrative   Not on file   Social Determinants of Health   Financial Resource Strain: High Risk (11/20/2022)   Overall Financial Resource Strain (CARDIA)    Difficulty of Paying Living Expenses: Very hard  Food Insecurity: Food Insecurity Present (02/11/2023)   Hunger Vital Sign    Worried About Running  Out of Food in the Last Year: Sometimes true    Ran Out of Food in the Last Year: Sometimes true  Transportation Needs: Unmet Transportation Needs (02/11/2023)   PRAPARE - Administrator, Civil Service (Medical): Yes    Lack of Transportation (Non-Medical): Yes  Physical Activity: Not on file  Stress: Not on file  Social Connections: Not on file     Family History: The patient's family history includes Diabetes in his brother and mother.  ROS:   Please see the history of present illness.     All other systems  reviewed and are negative.  EKGs/Labs/Other Studies Reviewed:    The following studies were reviewed today:   EKG:  EKG is  ordered today.  The ekg ordered today demonstrates  NSR, LAFB, anterior Q waves  09/07/2022- NSR with sinus arrhythmia , LAD   05/14/2021-Cath - RFA. 100% occluded LAD s/p Synergy 2.75 x 38 mm DES with shockwave to the LAD. 90% D1 s/p 2.5 x 12 synergy DES  Echo- EF 35%, mild MR, PASP 25 mmHg  S/p DC Medtronic ICD 05/20/2021 [id number]   Recent Labs: 02/11/2023: ALT 33; B Natriuretic Peptide 857.7; BUN 24; Creatinine, Ser 1.27; Hemoglobin 14.5; Platelets 133; Potassium 4.6; Sodium 141   Recent Lipid Panel    Component Value Date/Time   CHOL 99 (L) 01/07/2022 1203   TRIG 236 (H) 01/07/2022 1203   HDL 31 (L) 01/07/2022 1203   CHOLHDL 3.2 01/07/2022 1203   LDLCALC 31 01/07/2022 1203     Risk Assessment/Calculations:           Physical Exam:    VS:    There were no vitals filed for this visit.     Wt Readings from Last 3 Encounters:  02/11/23 140 lb (63.5 kg)  01/07/23 148 lb 6.4 oz (67.3 kg)  12/14/22 149 lb 9.6 oz (67.9 kg)     GEN:  Well nourished, well developed in no acute distress HEENT: Normal NECK: No JVD; No carotid bruits LYMPHATICS: No lymphadenopathy CARDIAC: RRR, no murmurs, rubs, gallops RESPIRATORY:  Clear to auscultation without rales, wheezing or rhonchi  ABDOMEN: Soft, non-tender,  non-distended MUSCULOSKELETAL:  No edema; No deformity  SKIN: Warm and dry NEUROLOGIC:  Alert and oriented x 3 PSYCHIATRIC:  Normal affect   ASSESSMENT:    #Ischemic CM NYHA Class II : TTE 30-35% 10/24/2022; unchanged.  Dry weight ~ 69 kg.   S/p ICD Medtronic DDD enrolled with remote device checks with Dr. Royann Shivers. - euvolemic today - continue metoprolol 50 mg XL daily - continue jardiance 10 mg daily - cannot tolerate spironolactone ; has CKD stage IIIa and c/f type IV renal tubular acidosis - continue hydralazine 25 mg TID - working with Octavio Graves from [facility] today to aid with getting entresto - continue lasix 40 mg daily  Paroxysmal Afib: new onset 11/30/2022. on eliquis and BB.   CAD: hx of anterior infarct. continue lipitor 20 mg daily. Continue BB. LDL 31 mg/dL 1/61/0960  PLAN:    In order of problems listed above:   Follow up in 6 months     Medication Adjustments/Labs and Tests Ordered: Current medicines are reviewed at length with the patient today.  Concerns regarding medicines are outlined above.    Signed, Maisie Fus, MD  02/16/2023 1:28 PM    [facility]

## 2023-02-17 ENCOUNTER — Encounter: Payer: Self-pay | Admitting: Internal Medicine

## 2023-02-18 ENCOUNTER — Other Ambulatory Visit: Payer: Self-pay

## 2023-02-18 ENCOUNTER — Other Ambulatory Visit: Payer: Self-pay | Admitting: Primary Care

## 2023-02-18 DIAGNOSIS — I5042 Chronic combined systolic (congestive) and diastolic (congestive) heart failure: Secondary | ICD-10-CM

## 2023-02-18 DIAGNOSIS — I10 Essential (primary) hypertension: Secondary | ICD-10-CM

## 2023-02-19 ENCOUNTER — Ambulatory Visit (INDEPENDENT_AMBULATORY_CARE_PROVIDER_SITE_OTHER): Payer: Self-pay

## 2023-02-19 DIAGNOSIS — I255 Ischemic cardiomyopathy: Secondary | ICD-10-CM

## 2023-02-19 LAB — CUP PACEART REMOTE DEVICE CHECK
Battery Remaining Longevity: 126 mo
Battery Voltage: 3 V
Brady Statistic AP VP Percent: 0.02 %
Brady Statistic AP VS Percent: 50.39 %
Brady Statistic AS VP Percent: 0.01 %
Brady Statistic AS VS Percent: 49.57 %
Brady Statistic RA Percent Paced: 50.07 %
Brady Statistic RV Percent Paced: 0.04 %
Date Time Interrogation Session: 20240502194829
HighPow Impedance: 52 Ohm
Implantable Lead Connection Status: 753985
Implantable Lead Connection Status: 753985
Implantable Lead Implant Date: 20220220
Implantable Lead Implant Date: 20220220
Implantable Lead Location: 753859
Implantable Lead Location: 753860
Implantable Lead Model: 5076
Implantable Pulse Generator Implant Date: 20220220
Lead Channel Impedance Value: 266 Ohm
Lead Channel Impedance Value: 342 Ohm
Lead Channel Impedance Value: 399 Ohm
Lead Channel Pacing Threshold Amplitude: 0.375 V
Lead Channel Pacing Threshold Amplitude: 0.5 V
Lead Channel Pacing Threshold Pulse Width: 0.4 ms
Lead Channel Pacing Threshold Pulse Width: 0.4 ms
Lead Channel Sensing Intrinsic Amplitude: 1.8 mV
Lead Channel Sensing Intrinsic Amplitude: 11.1 mV
Lead Channel Setting Pacing Amplitude: 1.5 V
Lead Channel Setting Pacing Amplitude: 2 V
Lead Channel Setting Pacing Pulse Width: 0.4 ms
Lead Channel Setting Sensing Sensitivity: 0.3 mV
Zone Setting Status: 755011
Zone Setting Status: 755011
Zone Setting Status: 755011

## 2023-02-22 ENCOUNTER — Other Ambulatory Visit: Payer: Self-pay | Admitting: Pharmacist

## 2023-02-22 ENCOUNTER — Other Ambulatory Visit: Payer: Self-pay

## 2023-02-22 ENCOUNTER — Telehealth: Payer: Self-pay | Admitting: Primary Care

## 2023-02-22 ENCOUNTER — Ambulatory Visit: Payer: Self-pay

## 2023-02-22 DIAGNOSIS — I10 Essential (primary) hypertension: Secondary | ICD-10-CM

## 2023-02-22 DIAGNOSIS — I5042 Chronic combined systolic (congestive) and diastolic (congestive) heart failure: Secondary | ICD-10-CM

## 2023-02-22 MED ORDER — FUROSEMIDE 40 MG PO TABS
40.0000 mg | ORAL_TABLET | Freq: Every day | ORAL | 1 refills | Status: DC
Start: 2023-02-22 — End: 2023-08-26
  Filled 2023-02-22: qty 90, 90d supply, fill #0
  Filled 2023-05-14: qty 90, 90d supply, fill #1

## 2023-02-22 NOTE — Telephone Encounter (Signed)
[name] called in requesting refills for pt - wanting to speak w/ pt's Child psychotherapist. Pt has prescriptions ready for pick up at [facility] per nursing notes and informed pt that I'd follow up w/ details a/b requested refill. Also sched financial appt.   [name] called in requesting refills for pt. Lasix to be filled by [facility] and to be picked up today.

## 2023-02-25 ENCOUNTER — Ambulatory Visit: Payer: Self-pay | Attending: Primary Care

## 2023-03-10 NOTE — Progress Notes (Signed)
Remote ICD transmission.   

## 2023-04-09 ENCOUNTER — Other Ambulatory Visit: Payer: Self-pay

## 2023-04-09 ENCOUNTER — Other Ambulatory Visit: Payer: Self-pay | Admitting: Primary Care

## 2023-04-09 DIAGNOSIS — E119 Type 2 diabetes mellitus without complications: Secondary | ICD-10-CM

## 2023-04-09 DIAGNOSIS — E785 Hyperlipidemia, unspecified: Secondary | ICD-10-CM

## 2023-04-09 NOTE — Telephone Encounter (Signed)
Requested medication (s) are due for refill today: yes  Requested medication (s) are on the active medication list: yes  Last refill:  02/11/23  Future visit scheduled: no  Notes to clinic:  Unable to refill per protocol, last refill by another provider.  Routing for review.     Requested Prescriptions  Pending Prescriptions Disp Refills   atorvastatin (LIPITOR) 20 MG tablet 60 tablet 3    Sig: Take 1 tablet (20 mg total) by mouth daily.     Cardiovascular:  Antilipid - Statins Failed - 04/09/2023 12:51 PM      Failed - Lipid Panel in normal range within the last 12 months    Cholesterol, Total  Date Value Ref Range Status  01/07/2022 99 (L) 100 - 199 mg/dL Final   LDL Chol Calc (NIH)  Date Value Ref Range Status  01/07/2022 31 0 - 99 mg/dL Final   HDL  Date Value Ref Range Status  01/07/2022 31 (L) >39 mg/dL Final   Triglycerides  Date Value Ref Range Status  01/07/2022 236 (H) 0 - 149 mg/dL Final         Passed - Patient is not pregnant      Passed - Valid encounter within last 12 months    Recent Outpatient Visits           4 months ago Type 2 diabetes mellitus without complication, without long-term current use of insulin (HCC)   [place] [facility] Grayce Sessions, NP   11 months ago Type 2 diabetes mellitus without complication, without long-term current use of insulin (HCC)   [place] [facility] Grayce Sessions, NP   1 year ago Type 2 diabetes mellitus without complication, without long-term current use of insulin (HCC)   [place] [facility] Grayce Sessions, NP   1 year ago Type 2 diabetes mellitus without complication, without long-term current use of insulin (HCC)   [place] [facility] Grayce Sessions, NP   1 year ago Type 2 diabetes mellitus without complication, without long-term current use of insulin (HCC)   [place] [facility] Grayce Sessions, NP       Future Appointments             In 2 months Branch, Alben Spittle, MD [facility]

## 2023-04-14 ENCOUNTER — Other Ambulatory Visit: Payer: Self-pay

## 2023-04-27 ENCOUNTER — Telehealth: Payer: Self-pay

## 2023-04-27 NOTE — Telephone Encounter (Signed)
Eliquis patient assistance medications placed up front

## 2023-04-27 NOTE — Telephone Encounter (Signed)
Called patient to advise patients medication Eliquis is available for pick at the office.

## 2023-04-27 NOTE — Telephone Encounter (Signed)
Attempted to call patients sister to advise patients Eliquis is in the office for pick up.

## 2023-05-14 ENCOUNTER — Other Ambulatory Visit: Payer: Self-pay

## 2023-05-21 ENCOUNTER — Ambulatory Visit (INDEPENDENT_AMBULATORY_CARE_PROVIDER_SITE_OTHER): Payer: Self-pay

## 2023-05-21 DIAGNOSIS — I255 Ischemic cardiomyopathy: Secondary | ICD-10-CM

## 2023-06-01 NOTE — Progress Notes (Signed)
Remote ICD transmission.   

## 2023-06-09 ENCOUNTER — Telehealth: Payer: Self-pay

## 2023-06-09 NOTE — Telephone Encounter (Signed)
Call to sister regarding the samples of Eliquis that were not picked up on 7/9. She speaks English but to a certain degree so advised will call the patient.  Call to interpretor line, Angeline [phone].  She calls patient, no answer and LM to have him call our office.  Samples still at front for pick up if patient still needs them.

## 2023-07-05 ENCOUNTER — Other Ambulatory Visit: Payer: Self-pay

## 2023-07-05 ENCOUNTER — Ambulatory Visit: Payer: Self-pay | Attending: Internal Medicine | Admitting: Internal Medicine

## 2023-07-05 ENCOUNTER — Encounter: Payer: Self-pay | Admitting: Internal Medicine

## 2023-07-05 DIAGNOSIS — E119 Type 2 diabetes mellitus without complications: Secondary | ICD-10-CM

## 2023-07-05 DIAGNOSIS — I509 Heart failure, unspecified: Secondary | ICD-10-CM

## 2023-07-05 MED ORDER — EMPAGLIFLOZIN 10 MG PO TABS
10.0000 mg | ORAL_TABLET | Freq: Every day | ORAL | 3 refills | Status: DC
Start: 2023-07-05 — End: 2023-08-26
  Filled 2023-07-05: qty 90, 90d supply, fill #0

## 2023-07-05 NOTE — Progress Notes (Signed)
Cardiology Office Note:    Date:  07/05/2023   ID:  Pedro Kemp, DOB 1956-05-18, MRN [id number]  PCP:  Grayce Sessions, NP   [facility] Providers Cardiologist:  Maisie Fus, MD     Referring MD: Grayce Sessions, NP   No chief complaint on file.  Post Cardiac Arrest  History of Present Illness:    Pedro Kemp is a 67 y.o. male with a hx of T2DM, HLD, CKD stage IIIa, Ischemic CM,  spanish speaking requires an interpreter, referral for post hospitalization at [facility] for ischemic VT and cardiac arrest (interpretor Kandis Mannan #[id number])   He was admitted at [facility] 05/14/2021 to 05/22/2021.[name] presented to [facility] with chest pain, noted to have high risk NSTEMI and acute CHF with EF 35%. He had troponin up to 15 thousand. He had cath on 7/27 that showed 100% occluded mid LAD s/p shockwave and PCI. He also had disease in his diag s/p PCI. He subsequently had VT arrest 7/29 with ACLS and ROSC. He went back to the cath lab on 7/29 and stent was patent. This was concerning for scar mediated VT.  He had an EP study on 8/2 with inducible sustained monomorphic VT.  Managed with amiodarone.He underwent DC-ICD.  He had a moderate sized hematoma at the ICD site requiring monitoring.  He was also managed for acute respiratory failure 2/2 CHF exacerbation. Crt on discharge was 1.38. A1c was 9 %. He was discharged on asa 81 mg daily,  atorvastatin 80 mg daily, plavix 75 mg daily, lisinopril 20 mg daily, lasix 80 mg daily, metop XL 50 mg daily. Started on Jardiance 10 mg. His A1c is 7.5 %   He had appointments for cardiac rehab, with cardiology at [facility]: Dr Merrily Pew. ICD check with Dr. Lanae Boast in August.   He states that he did not do exercise afterwards. After he left the hospital, he had his ICD checked. He would like to have his care here. He reports no shocks. He has no swelling or pain at the site. He denies exertional chest pain or dyspnea. He denies orthopnea, PND, no LE edema. During the day  , he does some work around the house.  Prior to his hospitalization , he had no problems with the heart. He does not smoke.  He denies palpitations, LH. He gets some dizziness. His vision gets blurry. He has no hx of syncope. He is not working will need medication assistance.  Interim Hx: He says that his medications ran out. He did not return to the pharmacy for refills. He feels well. He has no chest pain or dyspnea on exertion. He denies orthopnea,  PND, or LE edema. He can do his activities without any issues.  Interim Hx 09/07/2022 Went to the ED on September, 5th for vague chest pressure. Felt like heartburn. ECG was atrial paced. He feels better. No heartburn. He notes some SOB only at night. He also notes his ICD is making noise  Wt Readings from Last 3 Encounters:  02/11/23 140 lb (63.5 kg)  01/07/23 148 lb 6.4 oz (67.3 kg)  12/14/22 149 lb 9.6 oz (67.9 kg)   Interim hx 01/07/2023 Hospitalized in early Jan for decompensated CHF. P/w pulmonary edema. In the setting of not taking his medications. He was diuresed. He was discharged on metop XL 50 mg d, entresto 24-26 mg daily, spironolactone 25 mg daily. He was discharged. On Jan 19, his BMET was checked and he had K of 5.7. Spironolactone  was stopped.  He saw Dr. Royann Shivers in January and Feb 2024. Noted [name] has not been performing remote ICD transmissions. He interrogated his device which showed evidence of paroxysmal atrial fibrillation fibrillation: 3 separate episodes that all occurred on February 12 lasting for about an hour. Overall burden of A-fib in the last 3 weeks is a total of 4 hours, with significant RVR, the final episode may have been a dual tachycardia with a period of VT. OptiVol was still trending up. Patient has struggled with getting entresto, 2/2 not providing paystubs for patient assistance and not taking metoprolol for unclear reason. He was asymptomatic at that time. He otherwise was started on  eliquis.  Today, he feels well. Notes he is taking the medications on his list. Pending entresto.He denies angina, dyspnea on exertion, lower extremity edema, PND or orthopnea.    Wt Readings from Last 3 Encounters:  02/11/23 140 lb (63.5 kg)  01/07/23 148 lb 6.4 oz (67.3 kg)  12/14/22 149 lb 9.6 oz (67.9 kg)    Interim 07/05/2023 He was in the ED and seen by Dr. Servando Salina in April. He thought he had an ICD shock, however his interrogation did not show a ventricular arrhythmia or an inappropriate shock.  He was diuresed however his BNP was 57. Today he denies SOB or CP.   Past Medical History:  Diagnosis Date   Cardiac arrest [facility])    CHF (congestive heart failure) (HCC)    Coronary artery disease    Diabetes mellitus without complication (HCC)    H/O heart artery stent    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    Past Surgical History:  Procedure Laterality Date   CARDIAC CATHETERIZATION     ICD IMPLANT      Current Medications: Current Outpatient Medications on File Prior to Visit  Medication Sig Dispense Refill   apixaban (ELIQUIS) 5 MG TABS tablet Take 1 tablet (5 mg total) by mouth 2 (two) times daily. 60 tablet 0   empagliflozin (JARDIANCE) 10 MG TABS tablet Take 1 tablet (10 mg total) by mouth daily before breakfast. 90 tablet 1   furosemide (LASIX) 40 MG tablet Take 1 tablet (40 mg total) by mouth daily. 90 tablet 1   metFORMIN (GLUCOPHAGE-XR) 500 MG 24 hr tablet Take 1 tablet (500 mg total) by mouth 2 (two) times daily with a meal. 180 tablet 1   metoprolol succinate (TOPROL-XL) 50 MG 24 hr tablet Take 1 tablet (50 mg total) by mouth daily. Take with or immediately following a meal. 90 tablet 3   aspirin EC 81 MG tablet Take 81 mg by mouth daily. Swallow whole. (Patient not taking: Reported on 07/05/2023)     atorvastatin (LIPITOR) 20 MG tablet Take 1 tablet (20 mg total) by mouth daily. (Patient not taking: Reported on 07/05/2023) 60 tablet 3    hydrALAZINE (APRESOLINE) 25 MG tablet Take 1 tablet (25 mg total) by mouth 3 (three) times daily. (Patient not taking: Reported on 07/05/2023) 90 tablet 3   No current facility-administered medications on file prior to visit.    Allergies:   Patient has no known allergies.   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  Tobacco Use  Smoking status: Never    Passive exposure: Never   Smokeless tobacco: Never  Vaping Use   Vaping status: Never Used  Substance and Sexual Activity   Alcohol use: Never   Drug use: Never   Sexual activity: Not Currently    Partners: Female  Other Topics Concern   Not on file  Social History Narrative   Not on file   Social Determinants of Health   Financial Resource Strain: High Risk (11/20/2022)   Overall Financial Resource Strain (CARDIA)    Difficulty of Paying Living Expenses: Very hard  Food Insecurity: Food Insecurity Present (02/11/2023)   Hunger Vital Sign    Worried About Running Out of Food in the Last Year: Sometimes true    Ran Out of Food in the Last Year: Sometimes true  Transportation Needs: Unmet Transportation Needs (02/11/2023)   PRAPARE - Administrator, Civil Service (Medical): Yes    Lack of Transportation (Non-Medical): Yes  Physical Activity: Not on file  Stress: Not on file  Social Connections: Not on file     Family History: The patient's family history includes Diabetes in his brother and mother.  ROS:   Please see the history of present illness.     All other systems reviewed and are negative.  EKGs/Labs/Other Studies Reviewed:    The following studies were reviewed today:   EKG:  EKG is  ordered today.  The ekg ordered today demonstrates  NSR, LAFB, anterior Q waves  09/07/2022- NSR with sinus  arrhythmia , LAD   Cardiology Studies  05/14/2021-Cath - RFA. 100% occluded LAD s/p Synergy 2.75 x 38 mm DES with shockwave to the LAD. 90% D1 s/p 2.5 x 12 synergy DES  Echo- EF 35%, mild MR, PASP 25 mmHg    Recent Labs: 02/11/2023: ALT 33; B Natriuretic Peptide 857.7; BUN 24; Creatinine, Ser 1.27; Hemoglobin 14.5; Platelets 133; Potassium 4.6; Sodium 141   Recent Lipid Panel    Component Value Date/Time   CHOL 99 (L) 01/07/2022 1203   TRIG 236 (H) 01/07/2022 1203   HDL 31 (L) 01/07/2022 1203   CHOLHDL 3.2 01/07/2022 1203   LDLCALC 31 01/07/2022 1203     Risk Assessment/Calculations:           Physical Exam:    VS:   Vitals:   07/05/23 1612  BP: 128/74  Pulse: 68  SpO2: 98%     Wt Readings from Last 3 Encounters:  02/11/23 140 lb (63.5 kg)  01/07/23 148 lb 6.4 oz (67.3 kg)  12/14/22 149 lb 9.6 oz (67.9 kg)     GEN:  Well nourished, well developed in no acute distress HEENT: Normal NECK: No JVD; No carotid bruits LYMPHATICS: No lymphadenopathy CARDIAC: RRR, no murmurs, rubs, gallops RESPIRATORY:  Clear to auscultation without rales, wheezing or rhonchi  ABDOMEN: Soft, non-tender, non-distended MUSCULOSKELETAL:  No edema; No deformity  SKIN: Warm and dry NEUROLOGIC:  Alert and oriented x 3 PSYCHIATRIC:  Normal affect   ASSESSMENT:    #Ischemic CM NYHA Class II : TTE 30-35% 10/24/2022; unchanged.  Dry weight ~ 69 kg.   S/p ICD Medtronic DDD enrolled with remote device checks with Dr. Royann Shivers. No issues with his device - euvolemic today; weight 68 kg today - continue metoprolol 50 mg XL daily - continue jardiance 10 mg daily (refilled) - had K >5; cannot tolerate spironolactone;  has CKD stage IIIa and c/f type IV renal tubular acidosis - entresto has been challenging to get  for him; he is eligible with GFR > 30; don't think a medicare candidate. Worked with Rutherford Nail in [place] who offered assistance ; he was supposed to fill out paperwork which he has not -  continue hydralazine 25 mg TID - continue lasix 40 mg daily  Paroxysmal Afib: new onset 11/30/2022. on eliquis and BB.  -Device check in August shows 0.2% burden -He was offered samples of eliquis to pick up which he has not; will provide today  CAD: hx of anterior infarct. continue lipitor 20 mg daily. Continue BB. LDL 31 mg/dL 4/40/1027  Medication compliance has been challenging. He has not filled out the paperwork for med assistance. I've spent over a year trying to help. Will just continue the medicines he is on moving forward and do what we can  PLAN:    In order of problems listed above:   Follow up in 6 months     Medication Adjustments/Labs and Tests Ordered: Current medicines are reviewed at length with the patient today.  Concerns regarding medicines are outlined above.    Signed, Maisie Fus, MD  07/05/2023 3:30 PM     [facility]

## 2023-07-05 NOTE — Patient Instructions (Signed)
Medication Instructions:  Your physician recommends that you continue on your current medications as directed. Please refer to the Current Medication list given to you today.  *If you need a refill on your cardiac medications before your next appointment, please call your pharmacy*  Lab Work: If you have labs (blood work) drawn today and your tests are completely normal, you will receive your results only by: MyChart Message (if you have MyChart) OR A paper copy in the mail If you have any lab test that is abnormal or we need to change your treatment, we will call you to review the results.  Testing/Procedures: None ordered today.  Follow-Up: At [facility], you and your health needs are our priority.  As part of our continuing mission to provide you with exceptional heart care, we have created designated Provider Care Teams.  These Care Teams include your primary Cardiologist (physician) and Advanced Practice Providers (APPs -  Physician Assistants and Nurse Practitioners) who all work together to provide you with the care you need, when you need it.  We recommend signing up for the patient portal called "MyChart".  Sign up information is provided on this After Visit Summary.  MyChart is used to connect with patients for Virtual Visits (Telemedicine).  Patients are able to view lab/test results, encounter notes, upcoming appointments, etc.  Non-urgent messages can be sent to your provider as well.   To learn more about what you can do with MyChart, go to [email or web address].    Your next appointment:   6 month(s)  Provider:   Maisie Fus, MD

## 2023-07-06 ENCOUNTER — Telehealth: Payer: Self-pay | Admitting: Licensed Clinical Social Worker

## 2023-07-06 NOTE — Telephone Encounter (Signed)
H&V Care Navigation CSW Progress Note  Clinical Social Worker  mailed [employer] and [employer] applications in Spanish  to pt home. Pt has not completed them or engaged with Artist as encouraged over the past year. I will sent updated information for [name] should pt wish to reapply.  Patient is participating in a Managed Medicaid Plan:  No, self pay only  SDOH Screenings   Food Insecurity: Food Insecurity Present (02/11/2023)  Housing: Medium Risk (02/11/2023)  Transportation Needs: Unmet Transportation Needs (02/11/2023)  Utilities: Not At Risk (02/11/2023)  Alcohol Screen: Low Risk  (10/26/2022)  Depression (PHQ2-9): Low Risk  (11/11/2022)  Financial Resource Strain: High Risk (11/20/2022)  Tobacco Use: Low Risk  (07/05/2023)    Octavio Graves, MSW, LCSW Clinical Social Worker II [facility] Heart/Vascular Care Navigation  [phone]- work cell phone (preferred) [phone]- desk phone

## 2023-07-16 ENCOUNTER — Telehealth: Payer: Self-pay | Admitting: Cardiovascular Disease

## 2023-07-16 NOTE — Telephone Encounter (Signed)
Phone note routed to triage per Dory Horn, RN

## 2023-07-16 NOTE — Telephone Encounter (Signed)
LVM with patient using pacific interpreters to schedule him an appt on 10/03 with Dr. Royann Shivers per Raj Janus, RN

## 2023-07-16 NOTE — Progress Notes (Unsigned)
Alert remote transmission: 1 AT/AF episode on 07/15/23 at 07:37, 5 hrs 2 min, average V rate 145 bpm, on Eliquis and metoprolol per Epic. Routed to clinic for elevated HR > 5 hrs duration.  Follow up as scheduled. MC, CVRS  Epic chat sent to Croitoru MD. "Got an alert for this patient for HVR w/ AF x >5 hours for yesterday. Lavenia Atlas been looking through his chart and I think he would benefit from seeing you in office. He saw gen cards [facility] 9/16. Reading Dr. Verna Czech note theres several things pertinent to the device and its function including a recent VF arrest w/ acls and rosc, continued medication noncompliance, likely worsening EF, and it sounds like the device has been alerting via tone per the patient. You last saw patient in February. Would you like me to bring patient in to see you? I spoke w/ cassie hall and your next available is 10/3 @ 1020."   Scheduling patient for 10/3 appointment per MD.

## 2023-07-20 ENCOUNTER — Telehealth: Payer: Self-pay

## 2023-07-20 NOTE — Telephone Encounter (Signed)
Called patient's sister [name] to advise medication is ready for pick up.

## 2023-08-20 ENCOUNTER — Ambulatory Visit (INDEPENDENT_AMBULATORY_CARE_PROVIDER_SITE_OTHER): Payer: Self-pay

## 2023-08-20 DIAGNOSIS — I255 Ischemic cardiomyopathy: Secondary | ICD-10-CM

## 2023-08-20 LAB — CUP PACEART REMOTE DEVICE CHECK
Battery Remaining Longevity: 121 mo
Battery Voltage: 3 V
Brady Statistic RV Percent Paced: 0.07 %
Date Time Interrogation Session: 20241031221430
HighPow Impedance: 61 Ohm
Implantable Lead Connection Status: 753985
Implantable Lead Connection Status: 753985
Implantable Lead Implant Date: 20220220
Implantable Lead Implant Date: 20220220
Implantable Lead Location: 753859
Implantable Lead Location: 753860
Implantable Lead Model: 5076
Implantable Pulse Generator Implant Date: 20220220
Lead Channel Impedance Value: 266 Ohm
Lead Channel Impedance Value: 342 Ohm
Lead Channel Impedance Value: 437 Ohm
Lead Channel Pacing Threshold Amplitude: 0.5 V
Lead Channel Pacing Threshold Amplitude: 0.625 V
Lead Channel Pacing Threshold Pulse Width: 0.4 ms
Lead Channel Pacing Threshold Pulse Width: 0.4 ms
Lead Channel Sensing Intrinsic Amplitude: 14.6 mV
Lead Channel Sensing Intrinsic Amplitude: 2.1 mV
Lead Channel Setting Pacing Amplitude: 1.5 V
Lead Channel Setting Pacing Amplitude: 2 V
Lead Channel Setting Pacing Pulse Width: 0.4 ms
Lead Channel Setting Sensing Sensitivity: 0.3 mV
Zone Setting Status: 755011
Zone Setting Status: 755011
Zone Setting Status: 755011

## 2023-08-24 ENCOUNTER — Inpatient Hospital Stay (HOSPITAL_COMMUNITY): Payer: MEDICAID

## 2023-08-24 ENCOUNTER — Emergency Department (HOSPITAL_COMMUNITY): Payer: MEDICAID

## 2023-08-24 ENCOUNTER — Encounter (HOSPITAL_COMMUNITY)
Admission: EM | Disposition: A | Discharge status: Home / Self Care | Payer: Self-pay | Source: Home / Self Care | Attending: Internal Medicine

## 2023-08-24 ENCOUNTER — Inpatient Hospital Stay (HOSPITAL_COMMUNITY): Payer: Self-pay

## 2023-08-24 ENCOUNTER — Other Ambulatory Visit: Payer: Self-pay

## 2023-08-24 ENCOUNTER — Encounter (HOSPITAL_COMMUNITY): Payer: Self-pay

## 2023-08-24 ENCOUNTER — Inpatient Hospital Stay (HOSPITAL_COMMUNITY)
Admission: EM | Admit: 2023-08-24 | Discharge: 2023-08-26 | DRG: 280 | Disposition: A | Discharge status: Home / Self Care | Payer: MEDICAID | Attending: Internal Medicine | Admitting: Internal Medicine

## 2023-08-24 DIAGNOSIS — I214 Non-ST elevation (NSTEMI) myocardial infarction: Secondary | ICD-10-CM

## 2023-08-24 DIAGNOSIS — N1831 Chronic kidney disease, stage 3a: Secondary | ICD-10-CM | POA: Diagnosis present

## 2023-08-24 DIAGNOSIS — J9601 Acute respiratory failure with hypoxia: Secondary | ICD-10-CM | POA: Insufficient documentation

## 2023-08-24 DIAGNOSIS — Z1152 Encounter for screening for COVID-19: Secondary | ICD-10-CM

## 2023-08-24 DIAGNOSIS — I252 Old myocardial infarction: Secondary | ICD-10-CM

## 2023-08-24 DIAGNOSIS — Z7982 Long term (current) use of aspirin: Secondary | ICD-10-CM | POA: Diagnosis not present

## 2023-08-24 DIAGNOSIS — Z955 Presence of coronary angioplasty implant and graft: Secondary | ICD-10-CM | POA: Diagnosis not present

## 2023-08-24 DIAGNOSIS — Z833 Family history of diabetes mellitus: Secondary | ICD-10-CM

## 2023-08-24 DIAGNOSIS — E1169 Type 2 diabetes mellitus with other specified complication: Secondary | ICD-10-CM

## 2023-08-24 DIAGNOSIS — Z79899 Other long term (current) drug therapy: Secondary | ICD-10-CM | POA: Diagnosis not present

## 2023-08-24 DIAGNOSIS — Z5941 Food insecurity: Secondary | ICD-10-CM

## 2023-08-24 DIAGNOSIS — Z95810 Presence of automatic (implantable) cardiac defibrillator: Secondary | ICD-10-CM | POA: Diagnosis not present

## 2023-08-24 DIAGNOSIS — Z8674 Personal history of sudden cardiac arrest: Secondary | ICD-10-CM

## 2023-08-24 DIAGNOSIS — I255 Ischemic cardiomyopathy: Secondary | ICD-10-CM | POA: Diagnosis present

## 2023-08-24 DIAGNOSIS — I480 Paroxysmal atrial fibrillation: Secondary | ICD-10-CM | POA: Diagnosis present

## 2023-08-24 DIAGNOSIS — I130 Hypertensive heart and chronic kidney disease with heart failure and stage 1 through stage 4 chronic kidney disease, or unspecified chronic kidney disease: Secondary | ICD-10-CM | POA: Diagnosis present

## 2023-08-24 DIAGNOSIS — Z7984 Long term (current) use of oral hypoglycemic drugs: Secondary | ICD-10-CM

## 2023-08-24 DIAGNOSIS — I4891 Unspecified atrial fibrillation: Secondary | ICD-10-CM

## 2023-08-24 DIAGNOSIS — Z7901 Long term (current) use of anticoagulants: Secondary | ICD-10-CM

## 2023-08-24 DIAGNOSIS — Z5986 Financial insecurity: Secondary | ICD-10-CM

## 2023-08-24 DIAGNOSIS — J189 Pneumonia, unspecified organism: Principal | ICD-10-CM | POA: Diagnosis present

## 2023-08-24 DIAGNOSIS — J810 Acute pulmonary edema: Secondary | ICD-10-CM

## 2023-08-24 DIAGNOSIS — Z555 Less than a high school diploma: Secondary | ICD-10-CM

## 2023-08-24 DIAGNOSIS — I10 Essential (primary) hypertension: Secondary | ICD-10-CM | POA: Diagnosis present

## 2023-08-24 DIAGNOSIS — I5042 Chronic combined systolic (congestive) and diastolic (congestive) heart failure: Secondary | ICD-10-CM | POA: Insufficient documentation

## 2023-08-24 DIAGNOSIS — R339 Retention of urine, unspecified: Secondary | ICD-10-CM | POA: Insufficient documentation

## 2023-08-24 DIAGNOSIS — E119 Type 2 diabetes mellitus without complications: Secondary | ICD-10-CM

## 2023-08-24 DIAGNOSIS — I2510 Atherosclerotic heart disease of native coronary artery without angina pectoris: Secondary | ICD-10-CM | POA: Diagnosis present

## 2023-08-24 DIAGNOSIS — E1165 Type 2 diabetes mellitus with hyperglycemia: Secondary | ICD-10-CM | POA: Diagnosis present

## 2023-08-24 DIAGNOSIS — I21A1 Myocardial infarction type 2: Secondary | ICD-10-CM | POA: Diagnosis present

## 2023-08-24 DIAGNOSIS — E785 Hyperlipidemia, unspecified: Secondary | ICD-10-CM

## 2023-08-24 DIAGNOSIS — I5023 Acute on chronic systolic (congestive) heart failure: Secondary | ICD-10-CM | POA: Diagnosis present

## 2023-08-24 DIAGNOSIS — Z91148 Patient's other noncompliance with medication regimen for other reason: Secondary | ICD-10-CM | POA: Diagnosis not present

## 2023-08-24 DIAGNOSIS — Z91120 Patient's intentional underdosing of medication regimen due to financial hardship: Secondary | ICD-10-CM

## 2023-08-24 DIAGNOSIS — E7800 Pure hypercholesterolemia, unspecified: Secondary | ICD-10-CM | POA: Diagnosis present

## 2023-08-24 DIAGNOSIS — I509 Heart failure, unspecified: Secondary | ICD-10-CM

## 2023-08-24 DIAGNOSIS — Z5971 Insufficient health insurance coverage: Secondary | ICD-10-CM

## 2023-08-24 DIAGNOSIS — E1122 Type 2 diabetes mellitus with diabetic chronic kidney disease: Secondary | ICD-10-CM | POA: Diagnosis present

## 2023-08-24 DIAGNOSIS — I5022 Chronic systolic (congestive) heart failure: Secondary | ICD-10-CM | POA: Insufficient documentation

## 2023-08-24 DIAGNOSIS — Z5982 Transportation insecurity: Secondary | ICD-10-CM

## 2023-08-24 DIAGNOSIS — R079 Chest pain, unspecified: Secondary | ICD-10-CM

## 2023-08-24 HISTORY — PX: RIGHT/LEFT HEART CATH AND CORONARY ANGIOGRAPHY: CATH118266

## 2023-08-24 LAB — ECHOCARDIOGRAM COMPLETE
AR max vel: 1.97 cm2
AV Area VTI: 1.95 cm2
AV Area mean vel: 1.7 cm2
AV Mean grad: 3 mm[Hg]
AV Peak grad: 4.3 mm[Hg]
Ao pk vel: 1.04 m/s
Area-P 1/2: 5.42 cm2
Height: 64 in
MV M vel: 5.1 m/s
MV Peak grad: 104 mm[Hg]
Radius: 0.6 cm
S' Lateral: 4.4 cm
Weight: 2405.66 [oz_av]

## 2023-08-24 LAB — CBC WITH DIFFERENTIAL/PLATELET
Abs Immature Granulocytes: 0 10*3/uL (ref 0.00–0.07)
Basophils Absolute: 0.2 10*3/uL — ABNORMAL HIGH (ref 0.0–0.1)
Basophils Relative: 1 %
Eosinophils Absolute: 4.6 10*3/uL — ABNORMAL HIGH (ref 0.0–0.5)
Eosinophils Relative: 19 %
HCT: 51.5 % (ref 39.0–52.0)
Hemoglobin: 16.4 g/dL (ref 13.0–17.0)
Lymphocytes Relative: 37 %
Lymphs Abs: 8.9 10*3/uL — ABNORMAL HIGH (ref 0.7–4.0)
MCH: 29.4 pg (ref 26.0–34.0)
MCHC: 31.8 g/dL (ref 30.0–36.0)
MCV: 92.3 fL (ref 80.0–100.0)
Monocytes Absolute: 1.2 10*3/uL — ABNORMAL HIGH (ref 0.1–1.0)
Monocytes Relative: 5 %
Neutro Abs: 9.2 10*3/uL — ABNORMAL HIGH (ref 1.7–7.7)
Neutrophils Relative %: 38 %
Platelets: 192 10*3/uL (ref 150–400)
RBC: 5.58 MIL/uL (ref 4.22–5.81)
RDW: 12.7 % (ref 11.5–15.5)
WBC: 24.1 10*3/uL — ABNORMAL HIGH (ref 4.0–10.5)
nRBC: 0 % (ref 0.0–0.2)

## 2023-08-24 LAB — POCT I-STAT EG7
Acid-base deficit: 2 mmol/L (ref 0.0–2.0)
Acid-base deficit: 5 mmol/L — ABNORMAL HIGH (ref 0.0–2.0)
Bicarbonate: 21.2 mmol/L (ref 20.0–28.0)
Bicarbonate: 23.9 mmol/L (ref 20.0–28.0)
Calcium, Ion: 1.11 mmol/L — ABNORMAL LOW (ref 1.15–1.40)
Calcium, Ion: 1.13 mmol/L — ABNORMAL LOW (ref 1.15–1.40)
HCT: 41 % (ref 39.0–52.0)
HCT: 41 % (ref 39.0–52.0)
Hemoglobin: 13.9 g/dL (ref 13.0–17.0)
Hemoglobin: 13.9 g/dL (ref 13.0–17.0)
O2 Saturation: 61 %
O2 Saturation: 67 %
Potassium: 3.9 mmol/L (ref 3.5–5.1)
Potassium: 4.1 mmol/L (ref 3.5–5.1)
Sodium: 140 mmol/L (ref 135–145)
Sodium: 140 mmol/L (ref 135–145)
TCO2: 22 mmol/L (ref 22–32)
TCO2: 25 mmol/L (ref 22–32)
pCO2, Ven: 41 mm[Hg] — ABNORMAL LOW (ref 44–60)
pCO2, Ven: 42.6 mm[Hg] — ABNORMAL LOW (ref 44–60)
pH, Ven: 7.322 (ref 7.25–7.43)
pH, Ven: 7.357 (ref 7.25–7.43)
pO2, Ven: 34 mm[Hg] (ref 32–45)
pO2, Ven: 37 mm[Hg] (ref 32–45)

## 2023-08-24 LAB — I-STAT CG4 LACTIC ACID, ED: Lactic Acid, Venous: 4.3 mmol/L (ref 0.5–1.9)

## 2023-08-24 LAB — BRAIN NATRIURETIC PEPTIDE: B Natriuretic Peptide: 684.5 pg/mL — ABNORMAL HIGH (ref 0.0–100.0)

## 2023-08-24 LAB — POCT I-STAT 7, (LYTES, BLD GAS, ICA,H+H)
Acid-base deficit: 6 mmol/L — ABNORMAL HIGH (ref 0.0–2.0)
Bicarbonate: 18.4 mmol/L — ABNORMAL LOW (ref 20.0–28.0)
Calcium, Ion: 1.05 mmol/L — ABNORMAL LOW (ref 1.15–1.40)
HCT: 41 % (ref 39.0–52.0)
Hemoglobin: 13.9 g/dL (ref 13.0–17.0)
O2 Saturation: 96 %
Potassium: 3.8 mmol/L (ref 3.5–5.1)
Sodium: 141 mmol/L (ref 135–145)
TCO2: 19 mmol/L — ABNORMAL LOW (ref 22–32)
pCO2 arterial: 32.8 mm[Hg] (ref 32–48)
pH, Arterial: 7.357 (ref 7.35–7.45)
pO2, Arterial: 82 mm[Hg] — ABNORMAL LOW (ref 83–108)

## 2023-08-24 LAB — TROPONIN I (HIGH SENSITIVITY)
Troponin I (High Sensitivity): 48 ng/L — ABNORMAL HIGH (ref <18)
Troponin I (High Sensitivity): 592 ng/L (ref <18)
Troponin I (High Sensitivity): 4055 ng/L (ref <18)
Troponin I (High Sensitivity): 4966 ng/L (ref <18)

## 2023-08-24 LAB — GLUCOSE, CAPILLARY
Glucose-Capillary: 161 mg/dL — ABNORMAL HIGH (ref 70–99)
Glucose-Capillary: 138 mg/dL — ABNORMAL HIGH (ref 70–99)

## 2023-08-24 LAB — LACTIC ACID, PLASMA: Lactic Acid, Venous: 1.5 mmol/L (ref 0.5–1.9)

## 2023-08-24 LAB — COMPREHENSIVE METABOLIC PANEL
ALT: 21 U/L (ref 0–44)
AST: 25 U/L (ref 15–41)
Albumin: 3.6 g/dL (ref 3.5–5.0)
Alkaline Phosphatase: 89 U/L (ref 38–126)
Anion gap: 9 (ref 5–15)
BUN: 24 mg/dL — ABNORMAL HIGH (ref 8–23)
CO2: 21 mmol/L — ABNORMAL LOW (ref 22–32)
Calcium: 8.6 mg/dL — ABNORMAL LOW (ref 8.9–10.3)
Chloride: 108 mmol/L (ref 98–111)
Creatinine, Ser: 1.48 mg/dL — ABNORMAL HIGH (ref 0.61–1.24)
GFR, Estimated: 52 mL/min — ABNORMAL LOW (ref >60)
Glucose, Bld: 248 mg/dL — ABNORMAL HIGH (ref 70–99)
Potassium: 3.5 mmol/L (ref 3.5–5.1)
Sodium: 138 mmol/L (ref 135–145)
Total Bilirubin: 0.7 mg/dL (ref <1.2)
Total Protein: 7.1 g/dL (ref 6.5–8.1)

## 2023-08-24 LAB — I-STAT CHEM 8, ED
BUN: 27 mg/dL — ABNORMAL HIGH (ref 8–23)
Calcium, Ion: 1.13 mmol/L — ABNORMAL LOW (ref 1.15–1.40)
Chloride: 108 mmol/L (ref 98–111)
Creatinine, Ser: 1.4 mg/dL — ABNORMAL HIGH (ref 0.61–1.24)
Glucose, Bld: 243 mg/dL — ABNORMAL HIGH (ref 70–99)
HCT: 52 % (ref 39.0–52.0)
Hemoglobin: 17.7 g/dL — ABNORMAL HIGH (ref 13.0–17.0)
Potassium: 3.4 mmol/L — ABNORMAL LOW (ref 3.5–5.1)
Sodium: 142 mmol/L (ref 135–145)
TCO2: 20 mmol/L — ABNORMAL LOW (ref 22–32)

## 2023-08-24 LAB — RESP PANEL BY RT-PCR (RSV, FLU A&B, COVID)  RVPGX2
Influenza A by PCR: NEGATIVE
Influenza B by PCR: NEGATIVE
Resp Syncytial Virus by PCR: NEGATIVE
SARS Coronavirus 2 by RT PCR: NEGATIVE

## 2023-08-24 LAB — CBG MONITORING, ED: Glucose-Capillary: 149 mg/dL — ABNORMAL HIGH (ref 70–99)

## 2023-08-24 SURGERY — RIGHT/LEFT HEART CATH AND CORONARY ANGIOGRAPHY
Anesthesia: LOCAL

## 2023-08-24 MED ORDER — LABETALOL HCL 5 MG/ML IV SOLN
10.0000 mg | INTRAVENOUS | Status: AC | PRN
Start: 2023-08-24 — End: 2023-08-24

## 2023-08-24 MED ORDER — VERAPAMIL HCL 2.5 MG/ML IV SOLN
INTRAVENOUS | Status: AC
  Filled 2023-08-24: qty 2

## 2023-08-24 MED ORDER — HYDRALAZINE HCL 20 MG/ML IJ SOLN
10.0000 mg | INTRAMUSCULAR | Status: AC | PRN
Start: 2023-08-24 — End: 2023-08-24

## 2023-08-24 MED ORDER — FUROSEMIDE 10 MG/ML IJ SOLN
INTRAMUSCULAR | Status: AC
  Filled 2023-08-24: qty 4

## 2023-08-24 MED ORDER — ASPIRIN 81 MG PO CHEW
324.0000 mg | CHEWABLE_TABLET | Freq: Once | ORAL | Status: AC
  Administered 2023-08-24: 324 mg via ORAL
  Filled 2023-08-24: qty 4

## 2023-08-24 MED ORDER — LIDOCAINE HCL (PF) 1 % IJ SOLN
INTRAMUSCULAR | Status: AC
  Filled 2023-08-24: qty 30

## 2023-08-24 MED ORDER — ATORVASTATIN CALCIUM 10 MG PO TABS
20.0000 mg | ORAL_TABLET | Freq: Every day | ORAL | Status: DC
  Administered 2023-08-24 – 2023-08-26 (×3): 20 mg via ORAL
  Filled 2023-08-24 (×3): qty 2

## 2023-08-24 MED ORDER — ACETAMINOPHEN 650 MG RE SUPP: 650.0000 mg | Freq: Four times a day (QID) | RECTAL | Status: DC | PRN

## 2023-08-24 MED ORDER — DEXTROSE 5 % IV SOLN
500.0000 mg | Freq: Once | INTRAVENOUS | Status: AC
  Administered 2023-08-24: 500 mg via INTRAVENOUS
  Filled 2023-08-24: qty 5

## 2023-08-24 MED ORDER — VERAPAMIL HCL 2.5 MG/ML IV SOLN
INTRAVENOUS | Status: DC | PRN
  Administered 2023-08-24: 10 mL via INTRA_ARTERIAL

## 2023-08-24 MED ORDER — FUROSEMIDE 10 MG/ML IJ SOLN
40.0000 mg | Freq: Two times a day (BID) | INTRAMUSCULAR | Status: DC
  Administered 2023-08-24 – 2023-08-26 (×4): 40 mg via INTRAVENOUS
  Filled 2023-08-24 (×4): qty 4

## 2023-08-24 MED ORDER — ASPIRIN 81 MG PO CHEW: 81.0000 mg | CHEWABLE_TABLET | ORAL | Status: DC

## 2023-08-24 MED ORDER — APIXABAN 5 MG PO TABS
5.0000 mg | ORAL_TABLET | Freq: Two times a day (BID) | ORAL | Status: DC
  Administered 2023-08-24: 5 mg via ORAL
  Filled 2023-08-24: qty 1

## 2023-08-24 MED ORDER — HEPARIN SODIUM (PORCINE) 1000 UNIT/ML IJ SOLN
INTRAMUSCULAR | Status: DC | PRN
  Administered 2023-08-24: 5000 [IU] via INTRA_ARTERIAL

## 2023-08-24 MED ORDER — HEPARIN (PORCINE) IN NACL 1000-0.9 UT/500ML-% IV SOLN
INTRAVENOUS | Status: DC | PRN
  Administered 2023-08-24 (×2): 500 mL

## 2023-08-24 MED ORDER — HEPARIN SODIUM (PORCINE) 1000 UNIT/ML IJ SOLN
INTRAMUSCULAR | Status: AC
  Filled 2023-08-24: qty 10

## 2023-08-24 MED ORDER — SODIUM CHLORIDE 0.9 % IV SOLN
2.0000 g | INTRAVENOUS | Status: DC
  Administered 2023-08-25 – 2023-08-26 (×2): 2 g via INTRAVENOUS
  Filled 2023-08-24 (×2): qty 20

## 2023-08-24 MED ORDER — IOHEXOL 350 MG/ML SOLN
INTRAVENOUS | Status: DC | PRN
  Administered 2023-08-24: 35 mL

## 2023-08-24 MED ORDER — INSULIN ASPART 100 UNIT/ML IJ SOLN
0.0000 [IU] | Freq: Every day | INTRAMUSCULAR | Status: DC
Start: 2023-08-24 — End: 2023-08-26

## 2023-08-24 MED ORDER — INSULIN ASPART 100 UNIT/ML IJ SOLN
0.0000 [IU] | Freq: Three times a day (TID) | INTRAMUSCULAR | Status: DC
Start: 2023-08-24 — End: 2023-08-26
  Administered 2023-08-24: 2 [IU] via SUBCUTANEOUS
  Administered 2023-08-25: 3 [IU] via SUBCUTANEOUS

## 2023-08-24 MED ORDER — MIDAZOLAM HCL 2 MG/2ML IJ SOLN
INTRAMUSCULAR | Status: DC | PRN
  Administered 2023-08-24: 1 mg via INTRAVENOUS

## 2023-08-24 MED ORDER — LIDOCAINE HCL (PF) 1 % IJ SOLN
INTRAMUSCULAR | Status: DC | PRN
  Administered 2023-08-24 (×2): 2 mL via INTRADERMAL

## 2023-08-24 MED ORDER — SODIUM CHLORIDE 0.9 % IV SOLN
1.0000 g | Freq: Once | INTRAVENOUS | Status: AC
  Administered 2023-08-24: 1 g via INTRAVENOUS
  Filled 2023-08-24: qty 10

## 2023-08-24 MED ORDER — DEXTROSE 5 % IV SOLN
500.0000 mg | INTRAVENOUS | Status: DC
  Administered 2023-08-25 – 2023-08-26 (×2): 500 mg via INTRAVENOUS
  Filled 2023-08-24 (×2): qty 5

## 2023-08-24 MED ORDER — HEPARIN (PORCINE) 25000 UT/250ML-% IV SOLN: 850.0000 [IU]/h | INTRAVENOUS | Status: DC

## 2023-08-24 MED ORDER — FUROSEMIDE 10 MG/ML IJ SOLN
40.0000 mg | Freq: Once | INTRAMUSCULAR | Status: AC
  Administered 2023-08-24: 40 mg via INTRAVENOUS
  Filled 2023-08-24: qty 4

## 2023-08-24 MED ORDER — SODIUM CHLORIDE 0.9% FLUSH
3.0000 mL | Freq: Two times a day (BID) | INTRAVENOUS | Status: DC
  Administered 2023-08-24 – 2023-08-26 (×3): 3 mL via INTRAVENOUS

## 2023-08-24 MED ORDER — HEPARIN (PORCINE) 25000 UT/250ML-% IV SOLN
1200.0000 [IU]/h | INTRAVENOUS | Status: DC
  Administered 2023-08-24: 850 [IU]/h via INTRAVENOUS
  Administered 2023-08-26: 1200 [IU]/h via INTRAVENOUS
  Filled 2023-08-24 (×2): qty 250

## 2023-08-24 MED ORDER — SODIUM CHLORIDE 0.9 % IV SOLN: 250.0000 mL | INTRAVENOUS | Status: AC | PRN

## 2023-08-24 MED ORDER — LACTATED RINGERS IV BOLUS
1000.0000 mL | Freq: Once | INTRAVENOUS | Status: AC
  Administered 2023-08-24: 1000 mL via INTRAVENOUS

## 2023-08-24 MED ORDER — MIDAZOLAM HCL 2 MG/2ML IJ SOLN
INTRAMUSCULAR | Status: AC
  Filled 2023-08-24: qty 2

## 2023-08-24 MED ORDER — METOPROLOL SUCCINATE ER 50 MG PO TB24
50.0000 mg | ORAL_TABLET | Freq: Every day | ORAL | Status: DC
Start: 2023-08-24 — End: 2023-08-25
  Administered 2023-08-24: 50 mg via ORAL
  Filled 2023-08-24: qty 1
  Filled 2023-08-24: qty 2

## 2023-08-24 MED ORDER — ONDANSETRON HCL 4 MG/2ML IJ SOLN: 4.0000 mg | Freq: Four times a day (QID) | INTRAMUSCULAR | Status: DC | PRN

## 2023-08-24 MED ORDER — SODIUM CHLORIDE 0.9% FLUSH: 3.0000 mL | INTRAVENOUS | Status: DC | PRN

## 2023-08-24 MED ORDER — SODIUM CHLORIDE 0.9 % IV SOLN: INTRAVENOUS | Status: DC

## 2023-08-24 MED ORDER — ACETAMINOPHEN 325 MG PO TABS: 650.0000 mg | ORAL_TABLET | ORAL | Status: DC | PRN

## 2023-08-24 MED ORDER — ACETAMINOPHEN 325 MG PO TABS: 650.0000 mg | ORAL_TABLET | Freq: Four times a day (QID) | ORAL | Status: DC | PRN

## 2023-08-24 SURGICAL SUPPLY — 9 items
CATH BALLN WEDGE 5F 110CM (CATHETERS) IMPLANT
CATH INFINITI 5FR ANG PIGTAIL (CATHETERS) IMPLANT
CATH INFINITI AMBI 6FR TG (CATHETERS) IMPLANT
DEVICE RAD COMP TR BAND LRG (VASCULAR PRODUCTS) IMPLANT
GLIDESHEATH SLEND SS 6F .021 (SHEATH) IMPLANT
PACK CARDIAC CATHETERIZATION (CUSTOM PROCEDURE TRAY) ×1 IMPLANT
SET ATX-X65L (MISCELLANEOUS) IMPLANT
SHEATH GLIDE SLENDER 4/5FR (SHEATH) IMPLANT
WIRE EMERALD 3MM-J .035X260CM (WIRE) IMPLANT

## 2023-08-24 NOTE — Hospital Course (Addendum)
  Brief Narrative:  67 year old with CHF EF 35%, VT arrest status post ICD, CAD status post PCI 2022, CKD stage IIIa, DM 2, HLD admitted for acute dyspnea chest x-ray showing multifocal infiltrate, placed on BiPAP.  Had elevated WBC therefore started on antibiotics.  Due to elevated troponin, cardiology was consulted.  Echocardiogram showed EF 35%, LHC 11/5 showed multivessel disease with no clear culprit lesion.  Ongoing medication adjustment per cardiology.  Eventually cardiac medications adjusted as mentioned above.     Assessment & Plan:  Principal Problem:   Multifocal pneumonia Active Problems:   NSTEMI (non-ST elevated myocardial infarction) (HCC)   Type 2 diabetes mellitus with hyperglycemia, without long-term current use of insulin (HCC)   Chronic HFrEF (heart failure with reduced ejection fraction) (HCC)   Hyperlipidemia associated with type 2 diabetes mellitus (HCC)   Essential hypertension   Coronary artery disease   History of cardiac arrest   Paroxysmal atrial fibrillation (HCC)   Stage 3a chronic kidney disease (HCC)   Acute respiratory failure with hypoxia (HCC)   Inability to urinate   Acute respiratory distress secondary to multifocal infiltrate Patient is significantly well.  Will give him 3 more days of oral Augmentin and azithromycin to finish the course.   NSTEMI History of CAD History of CHF with EF 35% ICD in place -Significantly elevated troponin.  Echocardiogram shows reduced EF 35%.  Status post LHC 11/5, multivessel disease but no clear culprit lesion.  Cardiology recommending medical management.  Currently on Lasix as needed, Toprol-XL 12.5 mg, Lipitor 80 mg, Jardiance, Eliquis twice daily.   Inability to urinate Renal ultrasound negative for any acute pathology   Stage 3a chronic kidney disease (HCC) Creatinine stable relative to baseline 1.3-1.6   Paroxysmal atrial fibrillation (HCC) Continue metoprolol and Eliquis.    History of cardiac arrest    Essential hypertension GDMT per cardiology   Hyperlipidemia associated with type 2 diabetes mellitus (HCC) - Continue Lipitor   Type 2 diabetes mellitus with hyperglycemia, without long-term current use of insulin (HCC) Resume home metformin.  On Jardiance.       DVT prophylaxis: Eliquis today Code Status: Full code Family Communication:   Status is: Inpatient Remains inpatient appropriate because: Will do discharge   Subjective: No complaints feeling well.   Examination:   General exam: Appears calm and comfortable  Respiratory system: Clear to auscultation. Respiratory effort normal. Cardiovascular system: S1 & S2 heard, RRR. No JVD, murmurs, rubs, gallops or clicks. No pedal edema. Gastrointestinal system: Abdomen is nondistended, soft and nontender. No organomegaly or masses felt. Normal bowel sounds heard. Central nervous system: Alert and oriented. No focal neurological deficits. Extremities: Symmetric 5 x 5 power. Skin: No rashes, lesions or ulcers Psychiatry: Judgement and insight appear normal. Mood & affect appropriate.

## 2023-08-24 NOTE — Assessment & Plan Note (Addendum)
Aspirin stopped 12/14/2022 - Continue Eliquis, Lipitor, metoprolol

## 2023-08-24 NOTE — Assessment & Plan Note (Signed)
CHA2DS2-VASc 4, has been started on Eliquis but evidently has not taken it due to cost. - Continue metoprolol - Continue Eliquis - Will need TOC consult prior to discharge

## 2023-08-24 NOTE — Assessment & Plan Note (Signed)
Creatinine stable relative to baseline 1.3-1.6

## 2023-08-24 NOTE — Assessment & Plan Note (Signed)
-  Continue Lipitor °

## 2023-08-24 NOTE — H&P (Addendum)
History and Physical    Patient: Pedro Kemp JOA:[id number] DOB: August 28, 1956 DOA: 08/24/2023 DOS: the patient was seen and examined on 08/24/2023 PCP: Grayce Sessions, NP  Patient coming from: Home  Chief Complaint:  Chief Complaint  Patient presents with   Respiratory Distress       HPI:  Pedro Kemp is a 67 y.o. M with sCHF EF 30-35%, hx VT arrest s/p ICD, CAD s/p PCI last 2022, CKD IIIa baseline 1.3, DM, and HLD who presented with acute dyspnea.  Patient reports being out of his meds for some time, but asymptomatic until the night of admission, woke with severe dyspnea and called 9-1-1.    He was hypoxic to 76% and diaphoretic, placed on BiPAP.  CXR showed bilateral multifocal infiltrates.  BNP lower than baseline and no peripheral edema, but WBC 24K so started on antibiotics and admitted for pneumonia.      Review of Systems  Constitutional:  Negative for chills, fever and malaise/fatigue.  Respiratory:  Positive for shortness of breath. Negative for cough, hemoptysis, sputum production and wheezing.   Cardiovascular:  Negative for chest pain, orthopnea, leg swelling and PND.  All other systems reviewed and are negative.    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   Past Surgical History:  Procedure Laterality Date   CARDIAC CATHETERIZATION     ICD IMPLANT     Social History:  reports that he has never smoked. He has never been exposed to tobacco smoke. He has never used smokeless tobacco. He reports that he does not drink alcohol and does not use drugs.  No Known Allergies  Family History  Problem Relation Age of Onset   Diabetes Mother    Diabetes Brother     Prior to Admission medications   Medication Sig Start  Date End Date Taking? Authorizing Provider  furosemide (LASIX) 40 MG tablet Take 1 tablet (40 mg total) by mouth daily. 02/22/23  Yes Hoy Register, MD  metFORMIN (GLUCOPHAGE-XR) 500 MG 24 hr tablet Take 1 tablet (500 mg total) by mouth 2 (two) times daily with a meal. 11/11/22  Yes Grayce Sessions, NP  apixaban (ELIQUIS) 5 MG TABS tablet Take 1 tablet (5 mg total) by mouth 2 (two) times daily. Patient not taking: Reported on 08/24/2023 02/11/23   Charlynne Pander, MD  atorvastatin (LIPITOR) 20 MG tablet Take 1 tablet (20 mg total) by mouth daily. Patient not taking: Reported on 07/05/2023 02/11/23   Charlynne Pander, MD  empagliflozin (JARDIANCE) 10 MG TABS tablet Take 1 tablet (10 mg total) by mouth daily before breakfast. Patient not taking: Reported on 08/24/2023 07/05/23   Maisie Fus, MD  hydrALAZINE (APRESOLINE) 25 MG tablet Take 1 tablet (25 mg total) by mouth 3 (three) times daily. Patient not taking: Reported on 08/24/2023 11/20/22   Alen Bleacher, NP  metoprolol succinate (TOPROL-XL) 50 MG 24 hr tablet Take 1 tablet (50 mg total) by mouth daily. Take with or immediately following a meal. Patient not taking: Reported on 08/24/2023 12/14/22 12/09/23  Thurmon Fair, MD    Physical Exam: Vitals:   08/24/23 0800 08/24/23 0830 08/24/23 0850 08/24/23 0900  BP: 116/66 124/74  115/82  Pulse:  73 76  83  Resp: 16 14  (!) 25  Temp:      TempSrc:      SpO2: 96% 99% 99% 99%   Adult male, sitting up in bed, interactive and appropriate, appears tired RRR, no murmurs, no peripheral edema Respiratory rate seems slightly increased, she is somewhat short of breath, he is on nasal cannula, he has rales bilaterally, more dependently Abdomen soft, no tenderness palpation, no guarding Attention normal, affect normal, judgment and insight appear normal, speech fluent, face symmetric, all history collected through video phonic Spanish interpreter he has right transmetatarsal amputation,  old     Data Reviewed: CBC shows leukocytosis, no significant anemia or thrombocytopenia Basic metabolic panel shows stable renal function Chest x-ray shows bilateral opacities, personally reviewed EKG unremarkable, normal sinus rhythm, personally reviewed BNP lower than baseline    Assessment and Plan: * Multifocal pneumonia Given the rapid improvement in lactic acid, and overall lack of sepsis physiology suspect the lactic acidosis is mostly respiratory.  Sepsis ruled out. - Continue Rocephin and azithromycin - Wean oxygen as able - Follow blood cultures - I-S and flutter   ADDENDUM: NSTEMI Troponin on admission 48 > 592 > 4055 I worry his acute dyspnea was an anginal equivalent.  ECG showed rapid afib on admission (which could raise hstrop to 4000) but also ?Q waves on repeat sinus rhythm ECG. - Give aspirin 324 now - Start heparin - Consult Cardiology     Inability to urinate Patient reports inability to urinate for the last 2 days. - Obtain renal ultrasound  Acute respiratory failure with hypoxia (HCC) Presented with SpO2 76% on room air, tachypnea, shortness of breath.  Required BiPAP.  Improving.  Due to multifocal pneumonia. -Wean oxygen as able  Stage 3a chronic kidney disease (HCC) Creatinine stable relative to baseline 1.3-1.6  Paroxysmal atrial fibrillation (HCC) Briefly in A-fib on arrival, then converted to sinus tachycardia.  CHA2DS2-VASc 4, has been started on Eliquis but evidently has not taken it due to cost. - Continue metoprolol - Continue Eliquis - Will need TOC consult prior to discharge  History of cardiac arrest    Coronary artery disease Aspirin stopped 12/14/2022 - Continue Eliquis, Lipitor, metoprolol  Essential hypertension Blood pressure soft - Hold hydralazine, furosemide - Continue metoprolol  Hyperlipidemia associated with type 2 diabetes mellitus (HCC) - Continue Lipitor  Chronic HFrEF (heart failure with reduced  ejection fraction) (HCC) EF 30 to 35%, ICD in place.  Impression appears hypovolemic. - Hold Lasix, Jardiance - Likely resume Lasix tomorrow - Continue metoprolol - Hold hydralazine  Type 2 diabetes mellitus with hyperglycemia, without long-term current use of insulin (HCC) Hyperglycemic - Start sliding scale corrections - Hold Jardiance, metformin         Advance Care Planning: Full code  Consults: None  Family Communication: None present  Severity of Illness: The appropriate patient status for this patient is INPATIENT. Inpatient status is judged to be reasonable and necessary in order to provide the required intensity of service to ensure the patient's safety. The patient's presenting symptoms, physical exam findings, and initial radiographic and laboratory data in the context of their chronic comorbidities is felt to place them at high risk for further clinical deterioration. Furthermore, it is not anticipated that the patient will be medically stable for discharge from the hospital within 2 midnights of admission.   * I certify that at the point of admission it is my clinical judgment that the patient will require inpatient hospital  care spanning beyond 2 midnights from the point of admission due to high intensity of service, high risk for further deterioration and high frequency of surveillance required.*  Author: Alberteen Sam, MD 08/24/2023 9:21 AM  For on call review [email or web address].

## 2023-08-24 NOTE — ED Notes (Signed)
Pt tx to cath lab on tele transport.

## 2023-08-24 NOTE — ED Notes (Signed)
Upon entering the room pts rhythm was no longer afib, back to normal sinus, and the rate was much improved. MD informed and new EKG captured.

## 2023-08-24 NOTE — ED Notes (Signed)
Attempted second set of blood cultures, only able to obtain the blue top. RN notified. Blood sent to lab

## 2023-08-24 NOTE — Assessment & Plan Note (Signed)
Patient reports inability to urinate for the last 2 days. - Obtain renal ultrasound

## 2023-08-24 NOTE — ED Notes (Signed)
Pt being placed on BiPAP by RT currently.

## 2023-08-24 NOTE — ED Notes (Signed)
Trop 592, Danford MD made aware paged and secure chat

## 2023-08-24 NOTE — Assessment & Plan Note (Signed)
ADDENDUM: Troponin on admission 48 > 592 > 4055 I worry his acute dyspnea was an anginal equivalent.  ECG showed rapid afib on admission (which could raise hstrop to 4000) but also ?Q waves on repeat sinus rhythm ECG. - Give aspirin 324 now - Start heparin - Consult Cardiology

## 2023-08-24 NOTE — ED Notes (Signed)
Pt returned from vascular

## 2023-08-24 NOTE — Assessment & Plan Note (Addendum)
Presented with SpO2 76% on room air, tachypnea, shortness of breath.  Required BiPAP.  Improving.  Due to multifocal pneumonia. -Wean oxygen as able

## 2023-08-24 NOTE — ED Notes (Signed)
Pt tx to vascular with transport monitor.

## 2023-08-24 NOTE — Consult Note (Signed)
Cardiology Consultation   Patient ID: Pedro Kemp MRN: [id number]; DOB: 1955-11-07  Admit date: 08/24/2023 Date of Consult: 08/24/2023  PCP:  Grayce Sessions, NP   [facility] Providers Cardiologist:  Maisie Fus, MD        Patient Profile:   Pedro Kemp is a 67 y.o. 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 Maryfrances Bunnell.  History of Present Illness:  HPI obtained via virtual translator assistance.   Pedro Kemp presented to the ED for evaluation/management of acute onset dyspnea. Patient has been out of all heart medications except for Metformin and Eliquis. He was very hypoxic with diffuse rales, and was placed on non-rebreather->BiPAP. He was also noted to be in afib with RVR. Device interrogation showed onset of tachycardia around 3:15am this morning. Patient also acutely hypertensive. Initial labs with BNP 684.5, lactic acid 4.3. At first, there was concern of pneumonia. However, further labs checked, troponin 48->592->4055. Lactic acid improved to 1.5 following fluid resuscitation and management of hypoxia. Initial ECG tracing with stable anterior ST segment changes. Given significant rise in troponin and improvement in lactic acid, increased concern for dyspnea as anginal equivalent.   On exam in the ED, patient reports no chest pain at present time or in recent time frame, no symptoms similar to prior MI. No recent exertional intolerance. He reports upper abdominal pain that seems to be associated with food.   Cardiac history:  He was admitted at [facility] 05/14/2021 to 05/22/2021.Pedro Kemp presented to [facility] with chest pain,  noted to have high risk NSTEMI and acute CHF with EF 35%. He had troponin up to 15 thousand. He had cath on 7/27 that showed 100% occluded mid LAD s/p shockwave and PCI. He also had disease in his diag s/p PCI. He subsequently had VT arrest 7/29 with ACLS and ROSC. He went back to the cath lab on 7/29 and stent was patent. This was concerning for scar mediated VT.  He had an EP study on 8/2 with inducible sustained monomorphic VT.  Managed with amiodarone.He underwent DC-ICD.  He had a moderate sized hematoma at the ICD site requiring monitoring.  He was also managed for acute respiratory failure 2/2 CHF exacerbation. Crt on discharge was 1.38. A1c was 9 %. He was discharged on asa 81 mg daily,  atorvastatin 80 mg daily, plavix 75 mg daily, lisinopril 20 mg daily, lasix 80 mg daily, metop XL 50 mg daily. Started on Jardiance 10 mg.   Past Medical History:  Diagnosis Date   Cardiac arrest [facility])    CHF (congestive heart failure) (HCC)    Coronary artery disease    Diabetes mellitus without complication (HCC)    H/O heart artery stent    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    Past Surgical History:  Procedure Laterality Date   CARDIAC CATHETERIZATION     ICD IMPLANT       Home Medications:  Prior to Admission medications   Medication Sig Start Date End Date Taking? Authorizing Provider  furosemide (LASIX) 40 MG tablet Take 1 tablet (40 mg total) by mouth daily. 02/22/23  Yes Hoy Register, MD  metFORMIN (  GLUCOPHAGE-XR) 500 MG 24 hr tablet Take 1 tablet (500 mg total) by mouth 2 (two) times daily with a meal. 11/11/22  Yes Grayce Sessions, NP  apixaban (ELIQUIS) 5 MG TABS tablet Take 1 tablet (5 mg total) by mouth 2 (two) times daily. Patient not taking: Reported on 08/24/2023 02/11/23   Charlynne Pander, MD  atorvastatin (LIPITOR) 20 MG tablet Take 1 tablet (20 mg total) by mouth daily. Patient not taking: Reported on 07/05/2023 02/11/23   Charlynne Pander,  MD  empagliflozin (JARDIANCE) 10 MG TABS tablet Take 1 tablet (10 mg total) by mouth daily before breakfast. Patient not taking: Reported on 08/24/2023 07/05/23   Maisie Fus, MD  hydrALAZINE (APRESOLINE) 25 MG tablet Take 1 tablet (25 mg total) by mouth 3 (three) times daily. Patient not taking: Reported on 08/24/2023 11/20/22   Alen Bleacher, NP  metoprolol succinate (TOPROL-XL) 50 MG 24 hr tablet Take 1 tablet (50 mg total) by mouth daily. Take with or immediately following a meal. Patient not taking: Reported on 08/24/2023 12/14/22 12/09/23  Croitoru, Rachelle Hora, MD    Inpatient Medications: Scheduled Meds:  [START ON 08/25/2023] aspirin  81 mg Oral Pre-Cath   atorvastatin  20 mg Oral Daily   furosemide  40 mg Intravenous Once   insulin aspart  0-15 Units Subcutaneous TID WC   insulin aspart  0-5 Units Subcutaneous QHS   metoprolol succinate  50 mg Oral Daily   Continuous Infusions:  sodium chloride     [START ON 08/25/2023] azithromycin     [START ON 08/25/2023] cefTRIAXone (ROCEPHIN)  IV     heparin     PRN Meds: acetaminophen **OR** acetaminophen  Allergies:   No Known Allergies  Social History:   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  Tobacco Use   Smoking status: Never    Passive exposure: Never   Smokeless tobacco: Never  Vaping Use   Vaping status: Never Used  Substance and Sexual Activity   Alcohol use: Never   Drug use: Never   Sexual activity: Not Currently    Partners: Female  Other Topics Concern   Not on file  Social History Narrative   Not on file   Social Determinants of Health   Financial Resource Strain: High Risk (11/20/2022)   Overall Financial Resource Strain (CARDIA)    Difficulty of Paying Living Expenses: Very hard  Food Insecurity: Food Insecurity  Present (02/11/2023)   Hunger Vital Sign    Worried About Running Out of Food in the Last Year: Sometimes true    Ran Out of Food in the Last Year: Sometimes true  Transportation Needs: Unmet Transportation Needs (02/11/2023)   PRAPARE - Administrator, Civil Service (Medical): Yes    Lack of Transportation (Non-Medical): Yes  Physical Activity: Not on file  Stress: Not on file  Social Connections: Not on file  Intimate Partner Violence: Not At Risk (02/11/2023)   Humiliation, Afraid, Rape, and Kick questionnaire    Fear of Current or Ex-Partner: No    Emotionally Abused: No    Physically Abused: No    Sexually Abused: No    Family History:    Family History  Problem Relation Age of Onset   Diabetes  Mother    Diabetes Brother      ROS:  Please see the history of present illness.   All other ROS reviewed and negative.     Physical Exam/Data:   Vitals:   08/24/23 1137 08/24/23 1200 08/24/23 1300 08/24/23 1330  BP: 122/71 123/83 130/78 133/73  Pulse: 63 63 63 62  Resp: 18 (!) 21 18 18   Temp: 98.2 F (36.8 C)     TempSrc: Oral     SpO2: 100% 100% 99% 98%  Weight:  68.2 kg    Height:  5\' 4"  (1.626 m)      Intake/Output Summary (Last 24 hours) at 08/24/2023 1415 Last data filed at 08/24/2023 1610 Gross per 24 hour  Intake 1350 ml  Output --  Net 1350 ml      08/24/2023   12:00 PM 08/24/2023   10:23 AM 07/05/2023    4:12 PM  Last 3 Weights  Weight (lbs) 150 lb 5.7 oz 150 lb 5.7 oz 150 lb 6.4 oz  Weight (kg) 68.2 kg 68.2 kg 68.221 kg     Body mass index is 25.81 kg/m.  General:  Well nourished, well developed, in no acute distress HEENT: normal Neck: JVP to mandible with HOB at 35 degrees. + hepatojugular reflex Vascular: No carotid bruits; Distal pulses 2+ bilaterally Cardiac:  normal S1, S2; RRR; no murmur  Lungs: bibasilar crackles Abd: soft, nontender, no hepatomegaly  Ext: no edema Musculoskeletal:  No deformities, BUE and BLE strength normal  and equal Skin: warm and dry  Neuro:  CNs 2-12 intact, no focal abnormalities noted Psych:  Normal affect   EKG:  The latest EKG was personally reviewed and demonstrates: sinus tachycardia with IVCD and stable appearing septal lead ST segment and T wave abnormality Telemetry:  Telemetry was personally reviewed and demonstrates:  afib with RVR on initial telemetry. Patient subsequently in NSR with intermittent atrial pacing.   Relevant CV Studies:  10/24/22 TTE  IMPRESSIONS     1. Left ventricular ejection fraction, by estimation, is 30 to 35%. The  left ventricle has moderately decreased function. The left ventricle  demonstrates global hypokinesis. The left ventricular internal cavity size  was moderately to severely dilated.  Left ventricular diastolic parameters are indeterminate.   2. Right ventricular systolic function is normal. The right ventricular  size is normal. There is normal pulmonary artery systolic pressure.   3. Left atrial size was moderately dilated.   4. The mitral valve is normal in structure. Mild to moderate mitral valve  regurgitation. No evidence of mitral stenosis.   5. The aortic valve is tricuspid. Aortic valve regurgitation is not  visualized. No aortic stenosis is present.   Comparison(s): Prior images reviewed side by side.   Conclusion(s)/Recommendation(s): EF remains reduced, with severe dilation  of LV. MR better appreciated on current study.   FINDINGS   Left Ventricle: Left ventricular ejection fraction, by estimation, is 30  to 35%. The left ventricle has moderately decreased function. The left  ventricle demonstrates global hypokinesis. Definity contrast agent was  given IV to delineate the left  ventricular endocardial borders. The left ventricular internal cavity size  was moderately to severely dilated. There is no left ventricular  hypertrophy. Left ventricular diastolic parameters are indeterminate.   Right Ventricle: The right  ventricular size is normal. Right vetricular  wall thickness was not well visualized. Right ventricular systolic  function is normal. There is normal pulmonary artery systolic pressure.  The tricuspid regurgitant velocity is  1.94  m/s, and with an assumed right atrial pressure of 3 mmHg, the estimated  right ventricular systolic pressure is 18.1 mmHg.   Left Atrium: Left atrial size was moderately dilated.   Right Atrium: Right atrial size was normal in size.   Pericardium: Trivial pericardial effusion is present.   Mitral Valve: The mitral valve is normal in structure. Mild to moderate  mitral valve regurgitation. No evidence of mitral valve stenosis.   Tricuspid Valve: The tricuspid valve is normal in structure. Tricuspid  valve regurgitation is trivial. No evidence of tricuspid stenosis.   Aortic Valve: The aortic valve is tricuspid. Aortic valve regurgitation is  not visualized. No aortic stenosis is present. Aortic valve mean gradient  measures 3.0 mmHg. Aortic valve peak gradient measures 5.2 mmHg. Aortic  valve area, by VTI measures 1.87  cm.   Pulmonic Valve: The pulmonic valve was not well visualized. Pulmonic valve  regurgitation is trivial. No evidence of pulmonic stenosis.   Aorta: The aortic root and ascending aorta are structurally normal, with  no evidence of dilitation.   IAS/Shunts: The atrial septum is grossly normal.   Additional Comments: A device lead is visualized.   Laboratory Data:  High Sensitivity Troponin:   Recent Labs  Lab 09/16/23 0310 Sep 16, 2023 0549 09-16-23 1014  TROPONINIHS 48* 592* 4,055*     Chemistry Recent Labs  Lab 09/16/2023 0310 09-16-2023 0316  NA 138 142  K 3.5 3.4*  CL 108 108  CO2 21*  --   GLUCOSE 248* 243*  BUN 24* 27*  CREATININE 1.48* 1.40*  CALCIUM 8.6*  --   GFRNONAA 52*  --   ANIONGAP 9  --     Recent Labs  Lab 2023-09-16 0310  PROT 7.1  ALBUMIN 3.6  AST 25  ALT 21  ALKPHOS 89  BILITOT 0.7   Lipids No  results for input(s): "CHOL", "TRIG", "HDL", "LABVLDL", "LDLCALC", "CHOLHDL" in the last 168 hours.  Hematology Recent Labs  Lab 09-16-2023 0310 Sep 16, 2023 0316  WBC 24.1*  --   RBC 5.58  --   HGB 16.4 17.7*  HCT 51.5 52.0  MCV 92.3  --   MCH 29.4  --   MCHC 31.8  --   RDW 12.7  --   PLT 192  --    Thyroid No results for input(s): "TSH", "FREET4" in the last 168 hours.  BNP Recent Labs  Lab 09-16-23 0310  BNP 684.5*    DDimer No results for input(s): "DDIMER" in the last 168 hours.   Radiology/Studies:  US RENAL  Result Date: Sep 16, 2023 CLINICAL DATA:  Inability to urinate EXAM: RENAL / URINARY TRACT ULTRASOUND COMPLETE COMPARISON:  None Available. FINDINGS: Right Kidney: Renal measurements: 10.6 x 6.0 x 7.2 cm = volume: 239 mL. Echogenicity within normal limits. No mass or hydronephrosis visualized. Left Kidney: Renal measurements: 14.0 x 6.6 x 6.2 cm = volume: 322 mL. Echogenicity within normal limits. No mass or hydronephrosis visualized. Bladder: Appears normal for degree of bladder distention. Other: Mild prostatomegaly, volume 44 mL. Small bilateral pleural effusions. IMPRESSION: 1. No hydronephrosis.  Urinary bladder nondistended. 2. Mild prostatomegaly. 3. Small bilateral pleural effusions. Electronically Signed   By: Jearld Lesch M.D.   On: 16-Sep-2023 11:50   DG Chest Port 1 View  Result Date: 09/16/2023 CLINICAL DATA:  Shortness of breath EXAM: PORTABLE CHEST 1 VIEW COMPARISON:  02/11/2023 FINDINGS: Interstitial and airspace opacities in the lungs bilaterally, favoring multifocal pneumonia over interstitial edema. No definite pleural effusions. The  heart is normal in size.  Left subclavian ICD. IMPRESSION: Interstitial and airspace opacities in the lungs bilaterally, favoring multifocal pneumonia over interstitial edema. Electronically Signed   By: Charline Bills M.D.   On: 08/24/2023 03:35     Assessment and Plan:    CAD with troponin elevation concerning for  NSTEMI  Patient with hx 100% occluded mid LAD s/p shockwave and PCI in 2021 at [facility]. Admission complicated by VT arrest, concern for scar mediated VT. He had ICD implantation, Medtronic cobalt XT DR implanted 05/20/2021. Troponin this admission 48->592->4055. No acute changes on ECG but given hx, sudden onset dyspnea overnight could represent anginal equivalent. Patient could also have had acute CHF/flash pulmonary edema but will need ischemic evaluation.   STAT echocardiogram. No murmur heard on exam but could have acute ischemic MR based on symptoms at presentation. Urgent LHC today per Dr. Rosemary Holms with benefit>risk Heparin per ACS protocol Will need to assess lipids and re-initiate statin therapy Resume/continue Toprol XL 50mg    Acute on chronic systolic CHF  Patient with last TTE in January 2024 showing LVEF 30-35% with global hypokinesis. BNP this admission near recent levels at 684 but on physical exam/by imaging, patient with pulmonary edema and vascular congestion. Bibasilar rales and elevated JVP noted.   Patient has been without all medications recently except for Eliquis and Metformin. Per last OV notes, should be in Toprol XL 50mg  daily, Jardiance 10mg  daily, Hydralazine 25mg  TID, Lasix 40mg  daily. Has been considered for Entresto historically but does not appear that patient assistance was completed by patient. Appears that medication compliance historically challenging.  He will require IV diuresis acutely and optimization of GMDT with assistance from HF TOC team. Expect significantly elevated LVEDP on LHC. Per Dr. Rosemary Holms, IV lasix 40mg  this afternoon. Will need to carefully balance diuresis with intra-cath contrast.   Paroxysmal atrial fibrillation  Patient with paroxysmal atrial fibrillation first observed February 2024, was noted to be in afib with RVR on admission. He had spontaneous conversion back to NSR and is maintaining normal rhythm at time of cardiology consultation.    Patient reports compliance with Eliquis. Reiterate importance of this throughout admission.  Resume/continue Toprol XL 50mg .   Hypertension  Initially patient profoundly hypertensive. BP now much improved. Toprol XL 50mg  has been resumed. As above, will need to work to get patient on HFrEF GDMT as able following catheterization.   Per primary team: CKD IIIa DM type II  Risk Assessment/Risk Scores:     TIMI Risk Score for Unstable Angina or Non-ST Elevation MI:   The patient's TIMI risk score is 4, which indicates a 20% risk of all cause mortality, new or recurrent myocardial infarction or need for urgent revascularization in the next 14 days.  New York Heart Association (NYHA) Functional Class NYHA Class II at baseline. Class IV symptoms in the last 24 hours.  CHA2DS2-VASc Score = 4   This indicates a 4.8% annual risk of stroke. The patient's score is based upon: CHF History: 1 HTN History: 0 Diabetes History: 1 Stroke History: 0 Vascular Disease History: 1 Age Score: 1 Gender Score: 0         For questions or updates, please contact [facility] Please consult [email or web address] for contact info under    Signed, Perlie Gold, PA-C  08/24/2023 2:15 PM

## 2023-08-24 NOTE — ED Notes (Signed)
Provider at bedside

## 2023-08-24 NOTE — Assessment & Plan Note (Signed)
Hyperglycemic - Start sliding scale corrections - Hold Jardiance, metformin

## 2023-08-24 NOTE — Progress Notes (Signed)
  Carryover admission to the Day Admitter.  I discussed this case with the EDP, Dr. Blinda Leatherwood.  Per these discussions:   This is a 67 year old male with history of chronic systolic heart failure with LVEF 30 to 35%, status post AICD, paroxysmal atrial fibrillation, status post pacemaker, who is being admitted for suspected multifocal community-acquired pneumonia complicated by acute hypoxic respiratory failure, after presenting with shortness of breath.  Was found to have initial oxygen saturations in the 70s, and was started on BiPAP due to associated increased work of breathing.  Platelet cell count 23,000 and, initial lactate 4.0.  BNP was in the 600s, down from most recent prior value in the 800s.  Chest x-ray showed bilateral airspace opacities, right greater than left, concerning for multifocal pneumonia.  He is also initially noted to be in atrial fibrillation with RVR, for which pacemaker was interrogated, showing that his atrial fibrillation started around 315 this morning.  He received IV fluids, azithromycin, Rocephin, and his atrial fibrillation has spontaneously converted to sinus rhythm.  Blood cultures were collected prior to initiation of these IV antibiotics.  I have placed an order for inpatient admission for further evaluation management of the above.  I have placed some additional preliminary admit orders via the adult multi-morbid admission order set.  I will defer additional decision making regarding antibiotics to the admitting hospitalist.   Newton Pigg, DO Hospitalist

## 2023-08-24 NOTE — Progress Notes (Signed)
   08/24/23 0337  BiPAP/CPAP/SIPAP  $ Non-Invasive Ventilator  Non-Invasive Vent Set Up;Non-Invasive Vent Subsequent  $ Face Mask Medium Yes  BiPAP/CPAP/SIPAP Pt Type Adult  BiPAP/CPAP/SIPAP V60  Mask Type Full face mask  Mask Size Medium  Set Rate 12 breaths/min  Respiratory Rate 32 breaths/min  IPAP 10 cmH20  EPAP 5 cmH2O  FiO2 (%) 40 %  Flow Rate 0 lpm  Minute Ventilation 15  Leak 13  Peak Inspiratory Pressure (PIP) 11  Tidal Volume (Vt) 425  Patient Home Equipment No  Auto Titrate No  Press High Alarm 25 cmH2O  Press Low Alarm 5 cmH2O  CPAP/SIPAP surface wiped down Yes

## 2023-08-24 NOTE — ED Triage Notes (Signed)
Pt arrived from home via POV c/o sob. Pt noted to be pale, diaphoretic, and O2 76% on RA in triage.

## 2023-08-24 NOTE — Progress Notes (Signed)
PHARMACY - ANTICOAGULATION CONSULT NOTE  Pharmacy Consult for heparin  Indication: chest pain/ACS, on Eliquis PTA for Afib   No Known Allergies  Patient Measurements: Weight: 68.2 kg (150 lb 5.7 oz) Heparin Dosing Weight: 68.2kg   Vital Signs: Temp: 98.2 F (36.8 C) (11/05 1137) Temp Source: Oral (11/05 1137) BP: 123/83 (11/05 1200) Pulse Rate: 63 (11/05 1200)  Labs: Recent Labs    08/24/23 0310 08/24/23 0316 08/24/23 0549 08/24/23 1014  HGB 16.4 17.7*  --   --   HCT 51.5 52.0  --   --   PLT 192  --   --   --   CREATININE 1.48* 1.40*  --   --   TROPONINIHS 48*  --  592* 4,055*    Estimated Creatinine Clearance: 43.5 mL/min (A) (by C-G formula based on SCr of 1.4 mg/dL (H)).   Medical History: Past Medical History:  Diagnosis Date   Cardiac arrest [facility])    CHF (congestive heart failure) (HCC)    Coronary artery disease    Diabetes mellitus without complication (HCC)    H/O heart artery stent    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   Assessment: [place] admitted with NSTEMI, trop elevated to 4055. Patient on Eliquis prior to admission for atrial fibrillation. Last dose this morning at 10:44. HgB 17.7 and PLTs 192. Pharmacy consulted to dose heparin gtt.   Goal of Therapy:  Heparin level 0.3-0.7 units/ml aPTT 66-103  Monitor platelets by anticoagulation protocol: Yes   Plan:  No bolus due to recent Eliquis intake.  Will start heparin gtt 12 hours from last Eliquis dose.  Start heparin infusion at 850 units/hr Check anti-Xa level in 6 hours and daily while on heparin Continue to monitor H&H and platelets  Estill Batten, PharmD, BCCCP  08/24/2023,12:13 PM

## 2023-08-24 NOTE — Assessment & Plan Note (Signed)
Blood pressure soft - Hold hydralazine, furosemide - Continue metoprolol

## 2023-08-24 NOTE — Assessment & Plan Note (Signed)
EF 30 to 35%, ICD in place.  Impression appears hypovolemic. - Hold Lasix, Jardiance - Likely resume Lasix tomorrow - Continue metoprolol - Hold hydralazine

## 2023-08-24 NOTE — Assessment & Plan Note (Signed)
-   Continue Rocephin and azithromycin - Wean oxygen as able - Follow blood cultures - I-S and flutter

## 2023-08-24 NOTE — ED Provider Notes (Addendum)
[place] EMERGENCY DEPARTMENT AT [facility] Provider Note   CSN: [id number] Arrival date & time: 08/24/23  0300     History  Chief Complaint  Patient presents with   Respiratory Distress    Pedro Kemp is a 67 y.o. male.  Presents to the emergency department for evaluation of difficulty breathing.  Comes to the emergency department by POV from home.  Patient woke up with severe shortness of breath.  He reports a history of heart problems.  He has been out of his medications.  He is not experiencing chest pain.       Home Medications Prior to Admission medications   Medication Sig Start Date End Date Taking? Authorizing Provider  apixaban (ELIQUIS) 5 MG TABS tablet Take 1 tablet (5 mg total) by mouth 2 (two) times daily. 02/11/23   Charlynne Pander, MD  aspirin EC 81 MG tablet Take 81 mg by mouth daily. Swallow whole. Patient not taking: Reported on 07/05/2023    [provider]  atorvastatin (LIPITOR) 20 MG tablet Take 1 tablet (20 mg total) by mouth daily. Patient not taking: Reported on 07/05/2023 02/11/23   Charlynne Pander, MD  empagliflozin (JARDIANCE) 10 MG TABS tablet Take 1 tablet (10 mg total) by mouth daily before breakfast. 07/05/23   Maisie Fus, MD  furosemide (LASIX) 40 MG tablet Take 1 tablet (40 mg total) by mouth daily. 02/22/23   Hoy Register, MD  hydrALAZINE (APRESOLINE) 25 MG tablet Take 1 tablet (25 mg total) by mouth 3 (three) times daily. Patient not taking: Reported on 07/05/2023 11/20/22   Alen Bleacher, NP  metFORMIN (GLUCOPHAGE-XR) 500 MG 24 hr tablet Take 1 tablet (500 mg total) by mouth 2 (two) times daily with a meal. 11/11/22   Grayce Sessions, NP  metoprolol succinate (TOPROL-XL) 50 MG 24 hr tablet Take 1 tablet (50 mg total) by mouth daily. Take with or immediately following a meal. 12/14/22 12/09/23  Croitoru, Rachelle Hora, MD      Allergies    Patient has no known allergies.    Review of Systems   Review of  Systems  Physical Exam Updated Vital Signs BP (!) 126/94   Pulse (!) 128   Temp 98.1 F (36.7 C) (Oral)   Resp (!) 25   SpO2 100%  Physical Exam Vitals and nursing note reviewed.  Constitutional:      General: He is in acute distress.     Appearance: He is well-developed.  HENT:     Head: Normocephalic and atraumatic.     Mouth/Throat:     Mouth: Mucous membranes are moist.  Eyes:     General: Vision grossly intact. Gaze aligned appropriately.     Extraocular Movements: Extraocular movements intact.     Conjunctiva/sclera: Conjunctivae normal.  Cardiovascular:     Rate and Rhythm: Regular rhythm. Tachycardia present.     Pulses: Normal pulses.     Heart sounds: Normal heart sounds, S1 normal and S2 normal. No murmur heard.    No friction rub. No gallop.  Pulmonary:     Effort: Tachypnea, accessory muscle usage and respiratory distress present.     Breath sounds: Rales present.  Abdominal:     Palpations: Abdomen is soft.     Tenderness: There is no abdominal tenderness. There is no guarding or rebound.     Hernia: No hernia is present.  Musculoskeletal:        General: No swelling.     Cervical  back: Full passive range of motion without pain, normal range of motion and neck supple. No pain with movement, spinous process tenderness or muscular tenderness. Normal range of motion.     Right lower leg: 1+ Pitting Edema present.     Left lower leg: 1+ Pitting Edema present.  Skin:    General: Skin is warm and dry.     Capillary Refill: Capillary refill takes less than 2 seconds.     Findings: No ecchymosis, erythema, lesion or wound.  Neurological:     Mental Status: He is alert and oriented to person, place, and time.     GCS: GCS eye subscore is 4. GCS verbal subscore is 5. GCS motor subscore is 6.     Cranial Nerves: Cranial nerves 2-12 are intact.     Sensory: Sensation is intact.     Motor: Motor function is intact. No weakness or abnormal muscle tone.      Coordination: Coordination is intact.  Psychiatric:        Mood and Affect: Mood normal.        Speech: Speech normal.        Behavior: Behavior normal.     ED Results / Procedures / Treatments   Labs (all labs ordered are listed, but only abnormal results are displayed) Labs Reviewed  CBC WITH DIFFERENTIAL/PLATELET - Abnormal; Notable for the following components:      Result Value   WBC 24.1 (*)    Neutro Abs 9.2 (*)    Lymphs Abs 8.9 (*)    Monocytes Absolute 1.2 (*)    Eosinophils Absolute 4.6 (*)    Basophils Absolute 0.2 (*)    All other components within normal limits  COMPREHENSIVE METABOLIC PANEL - Abnormal; Notable for the following components:   CO2 21 (*)    Glucose, Bld 248 (*)    BUN 24 (*)    Creatinine, Ser 1.48 (*)    Calcium 8.6 (*)    GFR, Estimated 52 (*)    All other components within normal limits  BRAIN NATRIURETIC PEPTIDE - Abnormal; Notable for the following components:   B Natriuretic Peptide 684.5 (*)    All other components within normal limits  I-STAT CHEM 8, ED - Abnormal; Notable for the following components:   Potassium 3.4 (*)    BUN 27 (*)    Creatinine, Ser 1.40 (*)    Glucose, Bld 243 (*)    Calcium, Ion 1.13 (*)    TCO2 20 (*)    Hemoglobin 17.7 (*)    All other components within normal limits  I-STAT CG4 LACTIC ACID, ED - Abnormal; Notable for the following components:   Lactic Acid, Venous 4.3 (*)    All other components within normal limits  TROPONIN I (HIGH SENSITIVITY) - Abnormal; Notable for the following components:   Troponin I (High Sensitivity) 48 (*)    All other components within normal limits  RESP PANEL BY RT-PCR (RSV, FLU A&B, COVID)  RVPGX2  CULTURE, BLOOD (ROUTINE X 2)  CULTURE, BLOOD (ROUTINE X 2)  LACTIC ACID, PLASMA  TROPONIN I (HIGH SENSITIVITY)    EKG EKG Interpretation Date/Time:  Tuesday August 24 2023 03:13:19 EST Ventricular Rate:  158 PR Interval:  88 QRS Duration:  122 QT Interval:  288 QTC  Calculation: 472 R Axis:   -39  Text Interpretation: atrial fibrillationn with rapid ventricular response nonspecific ivcd QRS morphology similar to prior, now in AFIB Confirmed by Gilda Crease [id number]) on 08/24/2023  4:28:26 AM  Radiology DG Chest Port 1 View  Result Date: 08/24/2023 CLINICAL DATA:  Shortness of breath EXAM: PORTABLE CHEST 1 VIEW COMPARISON:  02/11/2023 FINDINGS: Interstitial and airspace opacities in the lungs bilaterally, favoring multifocal pneumonia over interstitial edema. No definite pleural effusions. The heart is normal in size.  Left subclavian ICD. IMPRESSION: Interstitial and airspace opacities in the lungs bilaterally, favoring multifocal pneumonia over interstitial edema. Electronically Signed   By: Charline Bills M.D.   On: 08/24/2023 03:35    Procedures Procedures    Medications Ordered in ED Medications  azithromycin (ZITHROMAX) 500 mg in dextrose 5 % 250 mL IVPB (500 mg Intravenous New Bag/Given 08/24/23 0537)  cefTRIAXone (ROCEPHIN) 1 g in sodium chloride 0.9 % 100 mL IVPB (0 g Intravenous Stopped 08/24/23 0530)  lactated ringers bolus 1,000 mL (1,000 mLs Intravenous New Bag/Given 08/24/23 0504)    ED Course/ Medical Decision Making/ A&P         CHA2DS2-VASc Score: 4                        Medical Decision Making Amount and/or Complexity of Data Reviewed Labs: ordered. Radiology: ordered.   Differential diagnosis considered includes, but not limited to: ACS; CHF; pneumonia; hypertensive emergency  Presents to the emergency department with shortness of breath.  Patient and fairly significant respiratory distress at arrival.  Patient very hypoxic and was placed on a nonrebreather at arrival.  Patient with diffuse rales on exam, some bilateral pitting edema.  Record review reveals history of of heart failure with reduced ejection fraction of 30 to 35%.  Patient reports that he has not been on his meds for an unknown period of time.  Patient  noted to be in atrial fibrillation.  He does have a history of paroxysmal atrial fibrillation.  Blood pressure quite high at arrival, hypertensive urgency considered.  HF was considered likely.  Chest x-ray, however, with asymmetry that was felt by radiology to be more consistent with multifocal pneumonia.  Patient placed on BiPAP with significant improvement.  Pressure has significantly improved, now normotensive without blood pressure treatment.  Lab work shows BNP at baseline with leukocytosis of 24.  Patient also has an elevated lactic acid of 4.3.  Treated as community-acquired pneumonia.  Will give a liter of fluid and reevaluate lactic acid.  AICD interrogated.  Patient entered into an atrial tachycardia at 3:15 AM this morning.  He was sinus with him prior to that.  Known to be on Eliquis but it is unclear what meds he has and is taking at home at this time.  Remains to be in A-fib.  I was be hesitant to give any Cardizem or Lopressor at this time, for fear of facilitating hypotension.  Fortunately, patient converted to sinus rhythm after a liter of fluid.  CRITICAL CARE Performed by: Gilda Crease   Total critical care time: 40 minutes  Critical care time was exclusive of separately billable procedures and treating other patients.  Critical care was necessary to treat or prevent imminent or life-threatening deterioration.  Critical care was time spent personally by me on the following activities: development of treatment plan with patient and/or surrogate as well as nursing, discussions with consultants, evaluation of patient's response to treatment, examination of patient, obtaining history from patient or surrogate, ordering and performing treatments and interventions, ordering and review of laboratory studies, ordering and review of radiographic studies, pulse oximetry and re-evaluation of patient's condition.  Final Clinical Impression(s) / ED Diagnoses Final  diagnoses:  Multifocal pneumonia  Atrial fibrillation with RVR [facility])    Rx / DC Orders ED Discharge Orders     None         Saige Canton, Canary Brim, MD 08/24/23 9528    Gilda Crease, MD 08/24/23 [phone]

## 2023-08-25 ENCOUNTER — Other Ambulatory Visit (HOSPITAL_COMMUNITY): Payer: Self-pay

## 2023-08-25 ENCOUNTER — Telehealth (HOSPITAL_COMMUNITY): Payer: Self-pay

## 2023-08-25 ENCOUNTER — Encounter (HOSPITAL_COMMUNITY): Payer: Self-pay | Admitting: Internal Medicine

## 2023-08-25 DIAGNOSIS — I5020 Unspecified systolic (congestive) heart failure: Secondary | ICD-10-CM

## 2023-08-25 LAB — COMPREHENSIVE METABOLIC PANEL
ALT: 18 U/L (ref 0–44)
AST: 30 U/L (ref 15–41)
Albumin: 2.8 g/dL — ABNORMAL LOW (ref 3.5–5.0)
Alkaline Phosphatase: 50 U/L (ref 38–126)
Anion gap: 13 (ref 5–15)
BUN: 28 mg/dL — ABNORMAL HIGH (ref 8–23)
CO2: 20 mmol/L — ABNORMAL LOW (ref 22–32)
Calcium: 8.3 mg/dL — ABNORMAL LOW (ref 8.9–10.3)
Chloride: 104 mmol/L (ref 98–111)
Creatinine, Ser: 1.23 mg/dL (ref 0.61–1.24)
GFR, Estimated: >60 mL/min (ref >60)
Glucose, Bld: 145 mg/dL — ABNORMAL HIGH (ref 70–99)
Potassium: 4.3 mmol/L (ref 3.5–5.1)
Sodium: 137 mmol/L (ref 135–145)
Total Bilirubin: 0.6 mg/dL (ref <1.2)
Total Protein: 5.9 g/dL — ABNORMAL LOW (ref 6.5–8.1)

## 2023-08-25 LAB — CBC
HCT: 39.9 % (ref 39.0–52.0)
Hemoglobin: 13.3 g/dL (ref 13.0–17.0)
MCH: 29.9 pg (ref 26.0–34.0)
MCHC: 33.3 g/dL (ref 30.0–36.0)
MCV: 89.7 fL (ref 80.0–100.0)
Platelets: 138 10*3/uL — ABNORMAL LOW (ref 150–400)
RBC: 4.45 MIL/uL (ref 4.22–5.81)
RDW: 12.8 % (ref 11.5–15.5)
WBC: 11 10*3/uL — ABNORMAL HIGH (ref 4.0–10.5)
nRBC: 0 % (ref 0.0–0.2)

## 2023-08-25 LAB — GLUCOSE, CAPILLARY
Glucose-Capillary: 161 mg/dL — ABNORMAL HIGH (ref 70–99)
Glucose-Capillary: 119 mg/dL — ABNORMAL HIGH (ref 70–99)
Glucose-Capillary: 131 mg/dL — ABNORMAL HIGH (ref 70–99)
Glucose-Capillary: 108 mg/dL — ABNORMAL HIGH (ref 70–99)
Glucose-Capillary: 156 mg/dL — ABNORMAL HIGH (ref 70–99)

## 2023-08-25 LAB — APTT
aPTT: 42 s — ABNORMAL HIGH (ref 24–36)
aPTT: 50 s — ABNORMAL HIGH (ref 24–36)

## 2023-08-25 LAB — HEMOGLOBIN A1C
Hgb A1c MFr Bld: 7.7 % — ABNORMAL HIGH (ref 4.8–5.6)
Mean Plasma Glucose: 174.29 mg/dL

## 2023-08-25 LAB — HEPARIN LEVEL (UNFRACTIONATED): Heparin Unfractionated: 0.79 [IU]/mL — ABNORMAL HIGH (ref 0.30–0.70)

## 2023-08-25 MED ORDER — EMPAGLIFLOZIN 10 MG PO TABS
10.0000 mg | ORAL_TABLET | Freq: Every day | ORAL | Status: DC
  Administered 2023-08-25 – 2023-08-26 (×2): 10 mg via ORAL
  Filled 2023-08-25 (×2): qty 1

## 2023-08-25 MED ORDER — GUAIFENESIN 100 MG/5ML PO LIQD: 5.0000 mL | ORAL | Status: DC | PRN

## 2023-08-25 MED ORDER — SENNOSIDES-DOCUSATE SODIUM 8.6-50 MG PO TABS: 1.0000 | ORAL_TABLET | Freq: Every evening | ORAL | Status: DC | PRN

## 2023-08-25 MED ORDER — TRAZODONE HCL 50 MG PO TABS: 50.0000 mg | ORAL_TABLET | Freq: Every evening | ORAL | Status: DC | PRN

## 2023-08-25 MED ORDER — LOSARTAN POTASSIUM 50 MG PO TABS: 50.0000 mg | ORAL_TABLET | Freq: Every day | ORAL | Status: DC

## 2023-08-25 MED ORDER — IPRATROPIUM-ALBUTEROL 0.5-2.5 (3) MG/3ML IN SOLN: 3.0000 mL | RESPIRATORY_TRACT | Status: DC | PRN

## 2023-08-25 MED ORDER — SACUBITRIL-VALSARTAN 24-26 MG PO TABS
1.0000 | ORAL_TABLET | Freq: Two times a day (BID) | ORAL | Status: DC
  Administered 2023-08-25 – 2023-08-26 (×3): 1 via ORAL
  Filled 2023-08-25 (×3): qty 1

## 2023-08-25 MED ORDER — SPIRONOLACTONE 25 MG PO TABS: 25.0000 mg | ORAL_TABLET | Freq: Every day | ORAL | Status: DC

## 2023-08-25 MED ORDER — LOSARTAN POTASSIUM 25 MG PO TABS: 25.0000 mg | ORAL_TABLET | Freq: Every day | ORAL | Status: DC

## 2023-08-25 MED ORDER — HYDRALAZINE HCL 20 MG/ML IJ SOLN
10.0000 mg | INTRAMUSCULAR | Status: DC | PRN
Start: 2023-08-25 — End: 2023-08-26

## 2023-08-25 NOTE — Progress Notes (Addendum)
PHARMACY - ANTICOAGULATION CONSULT NOTE  Pharmacy Consult for heparin  Indication: chest pain/ACS, on Eliquis PTA for Afib   No Known Allergies  Patient Measurements: Height: 5\' 4"  (162.6 cm) Weight: 68.2 kg (150 lb 5.7 oz) (Scale A) IBW/kg (Calculated) : 59.2 Heparin Dosing Weight: 68.2kg   Vital Signs: Temp: 98.2 F (36.8 C) (11/06 1105) Temp Source: Oral (11/06 1105) BP: 144/70 (11/06 1105) Pulse Rate: 63 (11/06 1105)  Labs: Recent Labs    08/24/23 0310 08/24/23 0316 08/24/23 0549 08/24/23 1014 08/24/23 1345 08/24/23 1600 08/24/23 1602 08/24/23 1603 08/25/23 0929  HGB 16.4 17.7*  --   --   --    < > 13.9 13.9 13.3  HCT 51.5 52.0  --   --   --    < > 41.0 41.0 39.9  PLT 192  --   --   --   --   --   --   --  138*  APTT  --   --   --   --   --   --   --   --  42*  HEPARINUNFRC  --   --   --   --   --   --   --   --  0.79*  CREATININE 1.48* 1.40*  --   --   --   --   --   --  1.23  TROPONINIHS 48*  --  592* 4,055* 4,966*  --   --   --   --    < > = values in this interval not displayed.    Estimated Creatinine Clearance: 49.5 mL/min (by C-G formula based on SCr of 1.23 mg/dL).   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   Assessment: [place] admitted with NSTEMI, trop elevated to 4055. Patient on Eliquis prior to admission for atrial fibrillation. Last dose 11/5. Pharmacy consulted to dose heparin.  aPTT subtherapeutic at 42 seconds - not yet correlated with heparin level. No issues with heparin infusion or signs of bleeding noted. Hgb wnl, Plt down 138.  Goal of Therapy:  Heparin level 0.3-0.7 units/ml aPTT 66-103  Monitor platelets by anticoagulation protocol: Yes   Plan:  Increase heparin infusion to  1050 units/hr Check anti-Xa level in 6 hours and daily until correlation Continue to monitor H&H and platelets Plan to discontinue on 11/7 AM per cards recs  Follow up transition to PTA Apixaban   Verdene Rio, PharmD PGY1 Pharmacy Resident

## 2023-08-25 NOTE — Progress Notes (Addendum)
Patient Name: Pedro Kemp Date of Encounter: 08/25/2023 [facility] Cardiologist: Maisie Fus, MD   Interval Summary  .    Spanish interpreter services used. No chest pain, breathing better.  Has no insurance, has difficulty getting medications.   Vital Signs .    Vitals:   08/24/23 2100 08/24/23 2301 08/25/23 0430 08/25/23 0736  BP: (!) 142/78 126/68 127/69 120/73  Pulse: 71 71 63 61  Resp: 13 17 17 17   Temp: 99.5 F (37.5 C) 99.9 F (37.7 C) 99.3 F (37.4 C) 98.5 F (36.9 C)  TempSrc: Oral Oral Oral Oral  SpO2: 94% 91% 96% 94%  Weight:   68.2 kg   Height:        Intake/Output Summary (Last 24 hours) at 08/25/2023 0927 Last data filed at 08/25/2023 0740 Gross per 24 hour  Intake 854.7 ml  Output 650 ml  Net 204.7 ml      08/25/2023    4:30 AM 08/24/2023   12:00 PM 08/24/2023   10:23 AM  Last 3 Weights  Weight (lbs) 150 lb 5.7 oz 150 lb 5.7 oz 150 lb 5.7 oz  Weight (kg) 68.2 kg 68.2 kg 68.2 kg      Telemetry/ECG    08/25/2023: - Personally Reviewed Atrial pacing  Coronary angiogram 08/24/2023 (Dr. Lynnette Caffey): Personally reviewed and independently interpreted by me   Prox RCA lesion is 60% stenosed.   Mid RCA lesion is 50% stenosed.   Dist RCA lesion is 80% stenosed.   Mid LM to Dist LM lesion is 20% stenosed.   Prox Cx to Mid Cx lesion is 80% stenosed.   2nd Mrg lesion is 99% stenosed.   1st Diag-2 lesion is 5% stenosed.   1st Diag-1 lesion is 80% stenosed.   RPDA lesion is 50% stenosed.   Previously placed Mid LAD stent of unknown type is  widely patent.   1.  Patent mid LAD and diagonal stents. 2.  High-grade ostial diagonal disease. 3.  High-grade mid left circumflex disease; this is a very small vessel and feeds a subtotally occluded obtuse marginal. 4.  Moderate diffuse disease of the right coronary artery with an 80% lesion of the distal RCA. 5.  Fick cardiac output of 3.6 L/min and Fick cardiac index of 2.0 L/min/m with the  following hemodynamics:             RA pressure mean 8 mmHg             RV pressure 37/-2 with an end-diastolic pressure of 11 mmHg             Wedge pressure mean of 17 mmHg             PA pressure 36/15 with a mean of 26 mmHg             PVR of 2.3 Woods units             PA pulsatility index of 2.6 6.  LVEDP of 27 to 30 mmHg   Recommendation: The images were reviewed with Dr. Rosemary Holms.  There does not seem to be a clear culprit lesion.  At this point in time we will treat medically.    Physical Exam .   Physical Exam Vitals and nursing note reviewed.  Constitutional:      General: He is not in acute distress. Neck:     Vascular: No JVD.  Cardiovascular:     Rate and Rhythm: Normal rate and regular  rhythm.     Heart sounds: Normal heart sounds. No murmur heard. Pulmonary:     Effort: Pulmonary effort is normal.     Breath sounds: Rales present. No wheezing.  Musculoskeletal:     Right lower leg: No edema.     Left lower leg: No edema.      Assessment & Plan .     67 year old Hispanic male with known hypertension, NIDDM, CAD, s/p VF arrest due to ACS in 2022 treated with mid LAD PCI, s/p ICD, paroxysmal A-fib, admitted with pulmonary edema, HFrEF  Pulmonary edema: Multivessel CAD with critical LM/prox LAD disease Presentation felt to be more due to pulnmonary edema in the setting untreated HfrEF than acute culprit vessel related NSTEMI Only 650 cc urine output since admission.  Appears more comfortable, with only mild rales.  Labs pending today. Currently on IV lasix 40 mg bid. Continue the same. Hold metoprolol for today until further diuresis and addition of other GDMT. H/o hyperkalemia and type IV RTA. Will avoid spironolactone. Will add Entresto 24-26 mg bid today (Heart failure fund could assist with outpatient meds). Will monitor renal function closely. Will resume Jardiance 10 mg daily. Will resume metoprolol tomorrow, possible at lower dose than 50  mg.  NSTEMI: Peak trop 5000s No acute culprit lesion found on cath 11/5. Continue medical management for CAD. Continue Aspirin. Statin, see above re: metoprolol. Will continue IV heparin till 11/7 AM for medical management of NSTEMI.  Paroxysmal Afib: Currently in sinus/atrial paced rhythm. High CHA2DS2VAsc score.  Continue IV heparin for now, transition back to Eliquis 5 mg bid on 11/7 AM.  NIDDM: SSI. Will resume metformin on discharge.  Will get heart failure transition care team involved.   Discussed above recommendations with primary team.  For questions or updates, please contact [facility] Please consult [email or web address] for contact info under        Signed, Elder Negus, MD

## 2023-08-25 NOTE — Plan of Care (Signed)
  Problem: Education: Goal: Ability to demonstrate management of disease process will improve Outcome: Progressing Goal: Ability to verbalize understanding of medication therapies will improve Outcome: Progressing Goal: Individualized Educational Video(s) Outcome: Progressing   Problem: Activity: Goal: Capacity to carry out activities will improve Outcome: Progressing   Problem: Cardiac: Goal: Ability to achieve and maintain adequate cardiopulmonary perfusion will improve Outcome: Progressing   Problem: Activity: Goal: Ability to tolerate increased activity will improve Outcome: Progressing   Problem: Clinical Measurements: Goal: Ability to maintain a body temperature in the normal range will improve Outcome: Progressing   Problem: Respiratory: Goal: Ability to maintain adequate ventilation will improve Outcome: Progressing Goal: Ability to maintain a clear airway will improve Outcome: Progressing   Problem: Education: Goal: Ability to describe self-care measures that may prevent or decrease complications (Diabetes Survival Skills Education) will improve Outcome: Progressing Goal: Individualized Educational Video(s) Outcome: Progressing   Problem: Coping: Goal: Ability to adjust to condition or change in health will improve Outcome: Progressing   Problem: Fluid Volume: Goal: Ability to maintain a balanced intake and output will improve Outcome: Progressing   Problem: Health Behavior/Discharge Planning: Goal: Ability to identify and utilize available resources and services will improve Outcome: Progressing Goal: Ability to manage health-related needs will improve Outcome: Progressing   Problem: Metabolic: Goal: Ability to maintain appropriate glucose levels will improve Outcome: Progressing   Problem: Nutritional: Goal: Maintenance of adequate nutrition will improve Outcome: Progressing Goal: Progress toward achieving an optimal weight will improve Outcome:  Progressing   Problem: Skin Integrity: Goal: Risk for impaired skin integrity will decrease Outcome: Progressing   Problem: Tissue Perfusion: Goal: Adequacy of tissue perfusion will improve Outcome: Progressing   Problem: Education: Goal: Knowledge of General Education information will improve Description: Including pain rating scale, medication(s)/side effects and non-pharmacologic comfort measures Outcome: Progressing   Problem: Health Behavior/Discharge Planning: Goal: Ability to manage health-related needs will improve Outcome: Progressing   Problem: Clinical Measurements: Goal: Ability to maintain clinical measurements within normal limits will improve Outcome: Progressing Goal: Will remain free from infection Outcome: Progressing Goal: Diagnostic test results will improve Outcome: Progressing Goal: Respiratory complications will improve Outcome: Progressing Goal: Cardiovascular complication will be avoided Outcome: Progressing   Problem: Activity: Goal: Risk for activity intolerance will decrease Outcome: Progressing   Problem: Nutrition: Goal: Adequate nutrition will be maintained Outcome: Progressing   Problem: Coping: Goal: Level of anxiety will decrease Outcome: Progressing   Problem: Elimination: Goal: Will not experience complications related to bowel motility Outcome: Progressing Goal: Will not experience complications related to urinary retention Outcome: Progressing   Problem: Pain Management: Goal: General experience of comfort will improve Outcome: Progressing   Problem: Safety: Goal: Ability to remain free from injury will improve Outcome: Progressing   Problem: Skin Integrity: Goal: Risk for impaired skin integrity will decrease Outcome: Progressing   Problem: Education: Goal: Understanding of CV disease, CV risk reduction, and recovery process will improve Outcome: Progressing Goal: Individualized Educational Video(s) Outcome:  Progressing   Problem: Activity: Goal: Ability to return to baseline activity level will improve Outcome: Progressing   Problem: Cardiovascular: Goal: Ability to achieve and maintain adequate cardiovascular perfusion will improve Outcome: Progressing Goal: Vascular access site(s) Level 0-1 will be maintained Outcome: Progressing   Problem: Health Behavior/Discharge Planning: Goal: Ability to safely manage health-related needs after discharge will improve Outcome: Progressing

## 2023-08-25 NOTE — TOC Initial Note (Signed)
Transition of Care [facility]) - Initial/Assessment Note    Patient Details  Name: Pedro Kemp MRN: [id number] Date of Birth: 1956/01/30  Transition [facility]) CM/SW Contact:    Harriet Masson, RN Phone Number: 08/25/2023, 2:34 PM  Clinical Narrative:                 Spoke to patient regarding transition needs.  Patient states it has been awhile since he has followed up with PCP, Gwinda Passe, Np but he is agreeable to a follow up. Patient lives with several nephews. Patient doesn't drive but has family that can take him to apts when they are not working.  Patient states he has some eliquis at home. Financial assistance form given to patient.  TOC will continue to follow for transition needs.  Expected Discharge Plan: Home/Self Care Barriers to Discharge: Continued Medical Work up   Patient Goals and CMS Choice Patient states their goals for this hospitalization and ongoing recovery are:: return home          Expected Discharge Plan and Services   Discharge Planning Services: CM Consult, Medication Assistance, MATCH Program   Living arrangements for the past 2 months: Single Family Home                                      Prior Living Arrangements/Services Living arrangements for the past 2 months: Single Family Home Lives with:: Relatives Patient language and need for interpreter reviewed:: Yes Do you feel safe going back to the place where you live?: Yes      Need for Family Participation in Patient Care: Yes (Comment) Care giver support system in place?: Yes (comment)   Criminal Activity/Legal Involvement Pertinent to Current Situation/Hospitalization: No - Comment as needed  Activities of Daily Living   ADL Screening (condition at time of admission) Independently performs ADLs?: Yes (appropriate for developmental age) Is the patient deaf or have difficulty hearing?: No Does the patient have difficulty seeing, even when wearing  glasses/contacts?: No Does the patient have difficulty concentrating, remembering, or making decisions?: No  Permission Sought/Granted                  Emotional Assessment Appearance:: Appears stated age Attitude/Demeanor/Rapport: Gracious Affect (typically observed): Accepting Orientation: : Oriented to Self, Oriented to  Time, Oriented to Place, Oriented to Situation Alcohol / Substance Use: Not Applicable Psych Involvement: No (comment)  Admission diagnosis:  CAP (community acquired pneumonia) [J18.9] Atrial fibrillation with RVR (HCC) [I48.91] Multifocal pneumonia [J18.9] Patient Active Problem List   Diagnosis Date Noted   Multifocal pneumonia 08/24/2023   Acute respiratory failure with hypoxia (HCC) 08/24/2023   Inability to urinate 08/24/2023   NSTEMI (non-ST elevated myocardial infarction) (HCC) 08/24/2023   Paroxysmal atrial fibrillation (HCC) 11/12/2022   Ventricular tachycardia (HCC) 11/12/2022   ICD (implantable cardioverter-defibrillator) in place 11/12/2022   Stage 3a chronic kidney disease (HCC) 11/12/2022   RTA (renal tubular acidosis) 11/12/2022   Acute on chronic systolic (congestive) heart failure (HCC) 10/24/2022   Acute on chronic HFrEF (heart failure with reduced ejection fraction) (HCC) 01/29/2022   Coronary artery disease 01/29/2022   History of cardiac arrest 01/29/2022   Leukocytosis 01/29/2022   Type 2 diabetes mellitus with hyperglycemia, without long-term current use of insulin (HCC) 07/22/2021   Chronic HFrEF (heart failure with reduced ejection fraction) (HCC) 07/22/2021   Hyperlipidemia associated with type 2 diabetes mellitus (  HCC) 07/22/2021   Essential hypertension 07/22/2021   PCP:  Grayce Sessions, NP Pharmacy:   [facility] [address] [place] [address] Phone: [phone] Fax: [phone]  [facility] [address] [place] [address] Phone: [phone] Fax: [phone]     Social Determinants of Health (SDOH) Social History: SDOH Screenings   Food Insecurity: No Food Insecurity (08/24/2023)  Housing: Low Risk  (08/24/2023)  Transportation Needs: No Transportation Needs (08/24/2023)  Utilities: Not At Risk (08/24/2023)  Alcohol Screen: Low Risk  (08/25/2023)  Depression (PHQ2-9): Low Risk  (11/11/2022)  Financial Resource Strain: High Risk (08/25/2023)  Tobacco Use: Low Risk  (08/24/2023)   SDOH Interventions: Alcohol Usage Interventions: Intervention Not Indicated (Score <7) Financial Strain Interventions: Other (Comment)   Readmission Risk Interventions     No data to display

## 2023-08-25 NOTE — Progress Notes (Signed)
PHARMACY - ANTICOAGULATION CONSULT NOTE  Pharmacy Consult for heparin  Indication: chest pain/ACS, on Eliquis PTA for Afib   No Known Allergies  Patient Measurements: Height: 5\' 4"  (162.6 cm) Weight: 68.2 kg (150 lb 5.7 oz) (Scale A) IBW/kg (Calculated) : 59.2 Heparin Dosing Weight: 68.2kg   Vital Signs: Temp: 98 F (36.7 C) (11/06 1929) Temp Source: Oral (11/06 1929) BP: 127/69 (11/06 1929) Pulse Rate: 68 (11/06 1929)  Labs: Recent Labs    08/24/23 0310 08/24/23 0316 08/24/23 0549 08/24/23 1014 08/24/23 1345 08/24/23 1600 08/24/23 1602 08/24/23 1603 08/25/23 0929 08/25/23 1851  HGB 16.4 17.7*  --   --   --    < > 13.9 13.9 13.3  --   HCT 51.5 52.0  --   --   --    < > 41.0 41.0 39.9  --   PLT 192  --   --   --   --   --   --   --  138*  --   APTT  --   --   --   --   --   --   --   --  42* 50*  HEPARINUNFRC  --   --   --   --   --   --   --   --  0.79*  --   CREATININE 1.48* 1.40*  --   --   --   --   --   --  1.23  --   TROPONINIHS 48*  --  592* 4,055* 4,966*  --   --   --   --   --    < > = values in this interval not displayed.    Estimated Creatinine Clearance: 49.5 mL/min (by C-G formula based on SCr of 1.23 mg/dL).    Assessment: Patinet admitted with NSTEMI, trop elevated to 4055. Patient on Eliquis prior to admission for atrial fibrillation. Last dose 11/5. Pharmacy consulted to dose heparin.  aPTT subtherapeutic at 50 seconds on heparin infusion at 1050 units/hr. Noted plan to d/c 11/7 a.m per cards recs then will transition back to Eliquis.  Goal of Therapy:  Heparin level 0.3-0.7 units/ml aPTT 66-103 sec Monitor platelets by anticoagulation protocol: Yes   Plan:  Increase heparin infusion to 1200 units/hr F/u 6 hr aPTT Plan to discontinue on 11/7 AM per cards recs  Follow up transition to PTA Apixaban  Christoper Fabian, PharmD, BCPS Please see amion for complete clinical pharmacist phone list 08/25/2023 7:44 PM

## 2023-08-25 NOTE — Telephone Encounter (Signed)
Heart Failure Patient Advocate Encounter  Medication assistance forms for Jardiance have been started. Patient has signed forms.  Provider information will need to be completed before submitting.  Forms have been attached to patient chart under 'Media' tab. Routing to appropriate office for follow up.  Burnell Blanks, CPhT Rx Patient Advocate Phone: [phone]

## 2023-08-25 NOTE — Progress Notes (Signed)
Heart Failure Stewardship Pharmacist Progress Note   PCP: Grayce Sessions, NP PCP-Cardiologist: Maisie Fus, MD   Spanish Interpreter: [phone]  HPI:  67 yo M with PMH of CHF, VT arrest s/p ICD, CAD s/p PCI, CKD IIIa, T2DM, and HLD.   He was admitted at [facility] 05/14/2021 to 05/22/2021. Presented with chest pain, noted to have high risk NSTEMI and acute CHF with EF 35%. He had troponin up to 15k. He had cath on 7/27 that showed 100% occluded mid LAD s/p shockwave and PCI. He also had disease in his diag s/p PCI. He subsequently had VT arrest 7/29 with ACLS and ROSC. He went back to the cath lab on 7/29 and stent was patent. This was concerning for scar mediated VT. He had an EP study on 8/2 with inducible sustained monomorphic VT. Managed with amiodarone. He underwent DC-ICD. He had a moderate sized hematoma at the ICD site requiring monitoring. He was also managed for acute respiratory failure 2/2 CHF exacerbation. SCr on discharge was 1.38. A1c was 9 %. He was discharged on asa 81 mg daily, atorvastatin 80 mg daily, plavix 75 mg daily, lisinopril 20 mg daily, lasix 80 mg daily, metop XL 50 mg daily. Started on Jardiance 10 mg.    Admitted in 01/2022 with acute on chronic HFrEF after stopping lasix as instructed by PCP. Diuresed with IV lasix. Continued on metoprolol and Jardiance, started on PO lasix at discharge.   Admitted in 10/2022 with acute on chronic HFrEF. Reported he ran out of medications 1 month prior. CXR with mild interstitial edema. ECHO 1/6 with LVEF 30-35%, global hypokinesis, RV normal. Discharged on furosemide 40 mg daily, Jardiance, Toprol, Entresto, and spironolactone.   He followed up with HF TOC clinic and his Sherryll Burger was increased to 49/51 mg BID. Unfortunately, Entresto and spironolactone were discontinued due to hyperkalemia.   Presented to the ED on 11/5 with shortness of breath after being out of medications again. CXR with findings favoring PNA over interstitial  edema. BNP 684.5. Lactic acid was 4.3 but improved to 1.5 following fluid resuscitation. ECHO 11/5 showed LVEF 30-35%, RWMA, mild asymmetric LVH, RV normal, moderate MR. Taken for [facility] on 11/5 and had patent mid LAD and diagonal stents. RA 8, PA 26, wedge 17, CO 3.6, CI 2.0.  Patient reports he is unsure how long he has been out of medications. 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   Current HF Medications: Diuretic: furosemide 40 mg IV BID ACE/ARB/ARNI: Entresto 24/26 mg BID SGLT2i: Jardiance 10 mg daily  Prior to admission HF Medications: Diuretic: furosemide 40 mg daily *was not taking metoprolol, Jardiance, or hydralazine  Pertinent Lab Values: Serum creatinine 1.40, BUN 27, Potassium 3.9, Sodium 140, BNP 684.5   Vital Signs: Weight: 150 lbs (admission weight: 150 lbs) Blood pressure: 120/70s  Heart rate: 60s  I/O: incomplete  Medication Assistance / Insurance Benefits Check: Does the patient have prescription insurance?  No  Does the patient qualify for medication assistance through manufacturers or grants?   Yes Eligible grants and/or patient assistance programs: Lorenza Chick Medication assistance applications in progress: Entresto  Medication assistance applications approved: Jardiance, Eliquis Approved medication assistance renewals will be completed by: [facility]  Patient has Jardiance assistance approved until 10/19/23 Patient has Eliquis patient assistance approved until 11/12/23  Patient has applied for [facility] patient assistance but the program needed a no-income attestation letter sent back to them. The program states they  mailed this out to him twice already but never received it back from the patient.   Outpatient Pharmacy:  Prior to admission outpatient pharmacy: [facility] Is the patient willing to use [facility] pharmacy at discharge? Yes  Assessment: 1. Acute on chronic systolic CHF (LVEF 30-35%), due to ICM and  medication noncompliance. NYHA class III symptoms. - Continue furosemide 40 mg IV BID. Strict I/Os and daily weights. Keep K>4 and Mg>2. - Started on Entresto 24/26 mg BID. Patient has been unable to stay on this in the past due to hyperkalemia and not returning proof of income documentation to patient assistance program. - No spironolactone 2/2 history of hyperkalemia on this  - Continue Jardiance 10 mg daily   Plan: 1) Medication changes recommended at this time: - Agree with changes; monitor K  2) Patient assistance: - Patient needs to call for refills of Eliquis and Jardiance via the patient assistance program. Assisted patient during visit today but was unsuccessful with both companies. The Jardiance representative said that we need the most recent Rx number but this information is not available to [place]. The Eliquis representative said that he cannot be currently admitted to request refills. They needed him to be physically discharged from the hospital. Will provide patient with the phone number to call for Eliquis refills. He confirms that he will be able to do this. - Since his Jardiance assistance will be expiring soon, a renewal form was completed and signed by the patient. Will send to MD for signature/submission. - Patient will need to return proof of income documentation (3 recent pay stubs) to [employer] in order to receive Entresto. - Patient would greatly benefit from paramedicine given his multiple admissions for HF due to medication noncompliance. He does not have a great health literacy and it is difficult for him to understand how to obtain his medications from a patient assistance program. - May be able to provide him with samples on discharge  3)  Education  - Patient has been educated on current HF medications and potential additions to HF medication regimen - Patient verbalizes understanding that over the next few months, these medication doses may change and more medications  may be added to optimize HF regimen - Patient has been educated on basic disease state pathophysiology and goals of therapy   Sharen Hones, PharmD, BCPS Heart Failure Stewardship Pharmacist Phone [phone]

## 2023-08-25 NOTE — Progress Notes (Signed)
PROGRESS NOTE    Pedro Kemp  IHK:[id number] DOB: December 21, 1955 DOA: 08/24/2023 PCP: Grayce Sessions, NP   Brief Narrative:  67 year old with CHF EF 35%, VT arrest status post ICD, CAD status post PCI 2022, CKD stage IIIa, DM 2, HLD admitted for acute dyspnea chest x-ray showing multifocal infiltrate, placed on BiPAP.  Had elevated WBC therefore started on antibiotics.  Due to elevated troponin, cardiology was consulted.  Recommending echocardiogram followed by cardiac catheterization later on.   Assessment & Plan:  Principal Problem:   Multifocal pneumonia Active Problems:   NSTEMI (non-ST elevated myocardial infarction) (HCC)   Type 2 diabetes mellitus with hyperglycemia, without long-term current use of insulin (HCC)   Chronic HFrEF (heart failure with reduced ejection fraction) (HCC)   Hyperlipidemia associated with type 2 diabetes mellitus (HCC)   Essential hypertension   Coronary artery disease   History of cardiac arrest   Paroxysmal atrial fibrillation (HCC)   Stage 3a chronic kidney disease (HCC)   Acute respiratory failure with hypoxia (HCC)   Inability to urinate   Acute respiratory distress secondary to multifocal infiltrate Definitely combination of fluid overload/pulmonary edema and possible underlying infection given leukocytosis.  For now continue empiric antibiotics, bronchodilators/I-S/flutter valve.  IV Lasix.  Supportive care.  NSTEMI History of CAD History of CHF with EF 35% ICD in place -Significantly elevated troponin.  Echocardiogram shows reduced EF 35%.  Status post LHC 11/5, multivessel disease but no clear culprit lesion.  Recommending medical management.  Further guidance per cardiology team.   Inability to urinate Renal ultrasound negative for any acute pathology   Stage 3a chronic kidney disease (HCC) Creatinine stable relative to baseline 1.3-1.6   Paroxysmal atrial fibrillation (HCC) Continue metoprolol and Eliquis.  IV as needed    History of cardiac arrest   Essential hypertension GDMT per cardiology   Hyperlipidemia associated with type 2 diabetes mellitus (HCC) - Continue Lipitor   Type 2 diabetes mellitus with hyperglycemia, without long-term current use of insulin (HCC) Sliding scale and Accu-Cheks. - Hold Jardiance, metformin     DVT prophylaxis: Eliquis to be resumed.  Currently on heparin drip Code Status: Full code Family Communication:   Status is: Inpatient Remains inpatient appropriate because: Ongoing cardiac evaluation    Subjective: No complaints  Chest pain free    Examination:  General exam: Appears calm and comfortable  Respiratory system: Clear to auscultation. Respiratory effort normal. Cardiovascular system: S1 & S2 heard, RRR. No JVD, murmurs, rubs, gallops or clicks. No pedal edema. Gastrointestinal system: Abdomen is nondistended, soft and nontender. No organomegaly or masses felt. Normal bowel sounds heard. Central nervous system: Alert and oriented. No focal neurological deficits. Extremities: Symmetric 5 x 5 power. Skin: No rashes, lesions or ulcers Psychiatry: Judgement and insight appear normal. Mood & affect appropriate.      Diet Orders (From admission, onward)     Start     Ordered   08/24/23 1709  Diet Heart Room service appropriate? Yes; Fluid consistency: Thin; Fluid restriction: 1800 mL Fluid  Diet effective now       Question Answer Comment  Room service appropriate? Yes   Fluid consistency: Thin   Fluid restriction: 1800 mL Fluid      08/24/23 1708            Objective: Vitals:   08/24/23 2100 08/24/23 2301 08/25/23 0430 08/25/23 0736  BP: (!) 142/78 126/68 127/69 120/73  Pulse: 71 71 63 61  Resp: 13 17 17 17   Temp:  99.5 F (37.5 C) 99.9 F (37.7 C) 99.3 F (37.4 C) 98.5 F (36.9 C)  TempSrc: Oral Oral Oral Oral  SpO2: 94% 91% 96% 94%  Weight:   68.2 kg   Height:        Intake/Output Summary (Last 24 hours) at 08/25/2023  0751 Last data filed at 08/25/2023 0740 Gross per 24 hour  Intake 854.7 ml  Output 650 ml  Net 204.7 ml   Filed Weights   08/24/23 1023 08/24/23 1200 08/25/23 0430  Weight: 68.2 kg 68.2 kg 68.2 kg    Scheduled Meds:  atorvastatin  20 mg Oral Daily   furosemide  40 mg Intravenous BID   insulin aspart  0-15 Units Subcutaneous TID WC   insulin aspart  0-5 Units Subcutaneous QHS   metoprolol succinate  50 mg Oral Daily   sodium chloride flush  3 mL Intravenous Q12H   Continuous Infusions:  sodium chloride     azithromycin 500 mg (08/25/23 0537)   cefTRIAXone (ROCEPHIN)  IV 2 g (08/25/23 0428)   heparin 850 Units/hr (08/24/23 2253)    Nutritional status     Body mass index is 25.81 kg/m.  Data Reviewed:   CBC: Recent Labs  Lab 08/24/23 0310 08/24/23 0316 08/24/23 1600 08/24/23 1602 08/24/23 1603  WBC 24.1*  --   --   --   --   NEUTROABS 9.2*  --   --   --   --   HGB 16.4 17.7* 13.9 13.9 13.9  HCT 51.5 52.0 41.0 41.0 41.0  MCV 92.3  --   --   --   --   PLT 192  --   --   --   --    Basic Metabolic Panel: Recent Labs  Lab 08/24/23 0310 08/24/23 0316 08/24/23 1600 08/24/23 1602 08/24/23 1603  NA 138 142 141 140 140  K 3.5 3.4* 3.8 4.1 3.9  CL 108 108  --   --   --   CO2 21*  --   --   --   --   GLUCOSE 248* 243*  --   --   --   BUN 24* 27*  --   --   --   CREATININE 1.48* 1.40*  --   --   --   CALCIUM 8.6*  --   --   --   --    GFR: Estimated Creatinine Clearance: 43.5 mL/min (A) (by C-G formula based on SCr of 1.4 mg/dL (H)). Liver Function Tests: Recent Labs  Lab 08/24/23 0310  AST 25  ALT 21  ALKPHOS 89  BILITOT 0.7  PROT 7.1  ALBUMIN 3.6   No results for input(s): "LIPASE", "AMYLASE" in the last 168 hours. No results for input(s): "AMMONIA" in the last 168 hours. Coagulation Profile: No results for input(s): "INR", "PROTIME" in the last 168 hours. Cardiac Enzymes: No results for input(s): "CKTOTAL", "CKMB", "CKMBINDEX", "TROPONINI" in  the last 168 hours. BNP (last 3 results) No results for input(s): "PROBNP" in the last 8760 hours. HbA1C: No results for input(s): "HGBA1C" in the last 72 hours. CBG: Recent Labs  Lab 08/24/23 1106 08/24/23 1728 08/24/23 2054 08/25/23 0604  GLUCAP 149* 161* 138* 161*   Lipid Profile: No results for input(s): "CHOL", "HDL", "LDLCALC", "TRIG", "CHOLHDL", "LDLDIRECT" in the last 72 hours. Thyroid Function Tests: No results for input(s): "TSH", "T4TOTAL", "FREET4", "T3FREE", "THYROIDAB" in the last 72 hours. Anemia Panel: No results for input(s): "VITAMINB12", "FOLATE", "FERRITIN", "TIBC", "IRON", "RETICCTPCT"  in the last 72 hours. Sepsis Labs: Recent Labs  Lab 08/24/23 0317 08/24/23 0549  LATICACIDVEN 4.3* 1.5    Recent Results (from the past 240 hour(s))  Resp panel by RT-PCR (RSV, Flu A&B, Covid) Anterior Nasal Swab     Status: None   Collection Time: 08/24/23  3:33 AM   Specimen: Anterior Nasal Swab  Result Value Ref Range Status   SARS Coronavirus 2 by RT PCR NEGATIVE NEGATIVE Final   Influenza A by PCR NEGATIVE NEGATIVE Final   Influenza B by PCR NEGATIVE NEGATIVE Final    Comment: (NOTE) The Xpert Xpress SARS-CoV-2/FLU/RSV plus assay is intended as an aid in the diagnosis of influenza from Nasopharyngeal swab specimens and should not be used as a sole basis for treatment. Nasal washings and aspirates are unacceptable for Xpert Xpress SARS-CoV-2/FLU/RSV testing.  Fact Sheet for Patients: BloggerCourse.com  Fact Sheet for Healthcare Providers: SeriousBroker.it  This test is not yet approved or cleared by the [place] FDA and has been authorized for detection and/or diagnosis of SARS-CoV-2 by FDA under an Emergency Use Authorization (EUA). This EUA will remain in effect (meaning this test can be used) for the duration of the COVID-19 declaration under Section 564(b)(1) of the Act, 21 U.S.C. section  360bbb-3(b)(1), unless the authorization is terminated or revoked.     Resp Syncytial Virus by PCR NEGATIVE NEGATIVE Final    Comment: (NOTE) Fact Sheet for Patients: BloggerCourse.com  Fact Sheet for Healthcare Providers: SeriousBroker.it  This test is not yet approved or cleared by the [place] FDA and has been authorized for detection and/or diagnosis of SARS-CoV-2 by FDA under an Emergency Use Authorization (EUA). This EUA will remain in effect (meaning this test can be used) for the duration of the COVID-19 declaration under Section 564(b)(1) of the Act, 21 U.S.C. section 360bbb-3(b)(1), unless the authorization is terminated or revoked.  Performed at [facility], [address], [place], [place] [address]          Radiology Studies: CARDIAC CATHETERIZATION  Result Date: 08/24/2023   Prox RCA lesion is 60% stenosed.   Mid RCA lesion is 50% stenosed.   Dist RCA lesion is 80% stenosed.   Mid LM to Dist LM lesion is 20% stenosed.   Prox Cx to Mid Cx lesion is 80% stenosed.   2nd Mrg lesion is 99% stenosed.   1st Diag-2 lesion is 5% stenosed.   1st Diag-1 lesion is 80% stenosed.   RPDA lesion is 50% stenosed.   Previously placed Mid LAD stent of unknown type is  widely patent. 1.  Patent mid LAD and diagonal stents. 2.  High-grade ostial diagonal disease. 3.  High-grade mid left circumflex disease; this is a very small vessel and feeds a subtotally occluded obtuse marginal. 4.  Moderate diffuse disease of the right coronary artery with an 80% lesion of the distal RCA. 5.  Fick cardiac output of 3.6 L/min and Fick cardiac index of 2.0 L/min/m with the following hemodynamics:  RA pressure mean 8 mmHg  RV pressure 37/-2 with an end-diastolic pressure of 11 mmHg  Wedge pressure mean of 17 mmHg  PA pressure 36/15 with a mean of 26 mmHg  PVR of 2.3 Woods units  PA pulsatility index of 2.6 6.  LVEDP of 27 to 30 mmHg  Recommendation: The images were reviewed with Dr. Rosemary Holms.  There does not seem to be a clear culprit lesion.  At this point in time we will treat medically.  ECHOCARDIOGRAM COMPLETE  Result Date: 08/24/2023    ECHOCARDIOGRAM REPORT   Patient Name:   Pedro Kemp Date of Exam: 08/24/2023 Medical Rec #:  [id number]         Height:       64.0 in Accession #:    [id number]        Weight:       150.4 lb Date of Birth:  03/14/1956         BSA:          1.733 m Patient Age:    67 years          BP:           133/73 mmHg Patient Gender: M                 HR:           79 bpm. Exam Location:  Inpatient Procedure: 2D Echo, Color Doppler and Cardiac Doppler STAT ECHO Indications:    Chest Pain/NSTEMI  History:        Patient has prior history of Echocardiogram examinations, most                 recent 10/24/2022. CHF, NSTEMI and CAD, Defibrillator, CKD,                 Arrythmias:Atrial Fibrillation, Signs/Symptoms:Chest Pain; Risk                 Factors:Diabetes, Dyslipidemia and Hypertension.  Sonographer:    Milbert Coulter Referring Phys: [id number] Perlie Gold IMPRESSIONS  1. No obvious LV thrombus on images obtained, would recommend limited contrast study to exclude thrombus. Left ventricular ejection fraction, by estimation, is 30 to 35%. The left ventricle has moderately decreased function. The left ventricle demonstrates regional wall motion abnormalities (see scoring diagram/findings for description). There is mild asymmetric left ventricular hypertrophy of the basal-septal segment. Indeterminate diastolic filling due to E-A fusion.  2. Right ventricular systolic function is normal. The right ventricular size is normal.  3. Left atrial size was mildly dilated.  4. The mitral valve is grossly normal. Moderate mitral valve regurgitation. No evidence of mitral stenosis.  5. The aortic valve is tricuspid. Aortic valve regurgitation is not visualized. No aortic stenosis is present. Comparison(s): No significant  change from prior study. WMA was present on prior study. Conclusion(s)/Recommendation(s): Findings consistent with ischemic cardiomyopathy. FINDINGS  Left Ventricle: No obvious LV thrombus on images obtained, would recommend limited contrast study to exclude thrombus. Left ventricular ejection fraction, by estimation, is 30 to 35%. The left ventricle has moderately decreased function. The left ventricle demonstrates regional wall motion abnormalities. The left ventricular internal cavity size was normal in size. There is mild asymmetric left ventricular hypertrophy of the basal-septal segment. Indeterminate diastolic filling due to E-A fusion.  LV Wall Scoring: The mid and distal anterior septum, apical anterior segment, and apex are akinetic. Right Ventricle: The right ventricular size is normal. No increase in right ventricular wall thickness. Right ventricular systolic function is normal. Left Atrium: Left atrial size was mildly dilated. Right Atrium: Right atrial size was normal in size. Pericardium: There is no evidence of pericardial effusion. Mitral Valve: The mitral valve is grossly normal. Moderate mitral valve regurgitation. No evidence of mitral valve stenosis. Tricuspid Valve: The tricuspid valve is grossly normal. Tricuspid valve regurgitation is mild . No evidence of tricuspid stenosis. Aortic Valve: The aortic valve is tricuspid. Aortic valve regurgitation is not visualized. No aortic stenosis is present.  Aortic valve mean gradient measures 3.0 mmHg. Aortic valve peak gradient measures 4.3 mmHg. Aortic valve area, by VTI measures 1.95 cm. Pulmonic Valve: The pulmonic valve was grossly normal. Pulmonic valve regurgitation is trivial. No evidence of pulmonic stenosis. Aorta: The aortic root and ascending aorta are structurally normal, with no evidence of dilitation. Venous: The inferior vena cava was not well visualized. IAS/Shunts: The atrial septum is grossly normal. Additional Comments: A device  lead is visualized in the right atrium and right ventricle.  LEFT VENTRICLE PLAX 2D LVIDd:         5.60 cm   Diastology LVIDs:         4.40 cm   LV e' medial:    6.31 cm/s LV PW:         0.90 cm   LV E/e' medial:  17.3 LV IVS:        1.30 cm   LV e' lateral:   6.85 cm/s LVOT diam:     2.00 cm   LV E/e' lateral: 15.9 LV SV:         37 LV SV Index:   21 LVOT Area:     3.14 cm  RIGHT VENTRICLE RV Basal diam:  2.90 cm RV Mid diam:    2.70 cm RV S prime:     12.00 cm/s TAPSE (M-mode): 2.2 cm LEFT ATRIUM              Index        RIGHT ATRIUM           Index LA diam:        4.90 cm  2.83 cm/m   RA Area:     14.60 cm LA Vol (A2C):   102.0 ml 58.86 ml/m  RA Volume:   37.10 ml  21.41 ml/m LA Vol (A4C):   44.5 ml  25.68 ml/m LA Biplane Vol: 69.0 ml  39.82 ml/m  AORTIC VALVE AV Area (Vmax):    1.97 cm AV Area (Vmean):   1.70 cm AV Area (VTI):     1.95 cm AV Vmax:           104.00 cm/s AV Vmean:          77.400 cm/s AV VTI:            0.190 m AV Peak Grad:      4.3 mmHg AV Mean Grad:      3.0 mmHg LVOT Vmax:         65.30 cm/s LVOT Vmean:        41.900 cm/s LVOT VTI:          0.118 m LVOT/AV VTI ratio: 0.62  AORTA Ao Root diam: 3.10 cm Ao Asc diam:  3.40 cm MITRAL VALVE                  TRICUSPID VALVE MV Area (PHT): 5.42 cm       TR Peak grad:   33.2 mmHg MV Decel Time: 140 msec       TR Vmax:        288.00 cm/s MR Peak grad:    104.0 mmHg MR Mean grad:    70.0 mmHg    SHUNTS MR Vmax:         510.00 cm/s  Systemic VTI:  0.12 m MR Vmean:        400.0 cm/s   Systemic Diam: 2.00 cm MR PISA:         2.26  cm MR PISA Eff ROA: 14 mm MR PISA Radius:  0.60 cm MV E velocity: 109.00 cm/s MV A velocity: 50.60 cm/s MV E/A ratio:  2.15 Lennie Odor MD Electronically signed by Lennie Odor MD Signature Date/Time: 08/24/2023/3:10:23 PM    Final    US RENAL  Result Date: 08/24/2023 CLINICAL DATA:  Inability to urinate EXAM: RENAL / URINARY TRACT ULTRASOUND COMPLETE COMPARISON:  None Available. FINDINGS: Right Kidney: Renal  measurements: 10.6 x 6.0 x 7.2 cm = volume: 239 mL. Echogenicity within normal limits. No mass or hydronephrosis visualized. Left Kidney: Renal measurements: 14.0 x 6.6 x 6.2 cm = volume: 322 mL. Echogenicity within normal limits. No mass or hydronephrosis visualized. Bladder: Appears normal for degree of bladder distention. Other: Mild prostatomegaly, volume 44 mL. Small bilateral pleural effusions. IMPRESSION: 1. No hydronephrosis.  Urinary bladder nondistended. 2. Mild prostatomegaly. 3. Small bilateral pleural effusions. Electronically Signed   By: Jearld Lesch M.D.   On: 08/24/2023 11:50   DG Chest Port 1 View  Result Date: 08/24/2023 CLINICAL DATA:  Shortness of breath EXAM: PORTABLE CHEST 1 VIEW COMPARISON:  02/11/2023 FINDINGS: Interstitial and airspace opacities in the lungs bilaterally, favoring multifocal pneumonia over interstitial edema. No definite pleural effusions. The heart is normal in size.  Left subclavian ICD. IMPRESSION: Interstitial and airspace opacities in the lungs bilaterally, favoring multifocal pneumonia over interstitial edema. Electronically Signed   By: Charline Bills M.D.   On: 08/24/2023 03:35           LOS: 1 day   Time spent= 35 mins    Miguel Rota, MD [facility]  If 7PM-7AM, please contact night-coverage  08/25/2023, 7:51 AM

## 2023-08-25 NOTE — Progress Notes (Signed)
Heart Failure Nurse Navigator Progress Note  PCP: Grayce Sessions, NP PCP-Cardiologist: Croitoru Admission Diagnosis: Multifocal pneumonia, Atrial fibrillation with RVR.  Admitted from: Home  Presentation:   Clayborne [name] presented with shortness of breath, noted to be pale, diaphoretic, O2 76% on  room air. Patient reported to being out of his medication for awhile, BP 126/94, HR 128, BNP 684, BLE edema +1. EKG with atrial fibrillation with RVR, AICD was interrogated. CXR with bilateral airspace opacities, right greater then left, concerning for multifocal pneumonia. Treated with antibiotics.   With the help of Interpreter De Nurse [phone]).   Patient reeducated on the sign and symptoms of heart failure, daily weights, diet/ fluid restrictions, taking all medications as prescribed, HF pharmacist working with patient in getting his medications sent to him by mail thru a patient assistance program and possible Para-medicine program started.   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  Tobacco Use   Smoking status: Never    Passive exposure: Never   Smokeless tobacco: Never  Vaping Use   Vaping status: Never Used  Substance and Sexual Activity   Alcohol use: Never   Drug use: Never   Sexual activity: Not Currently    Partners: Female  Other Topics Concern   Not on file  Social History Narrative   Not on file   Social Determinants of Health   Financial  Resource Strain: High Risk (11/20/2022)   Overall Financial Resource Strain (CARDIA)    Difficulty of Paying Living Expenses: Very hard  Food Insecurity: No Food Insecurity (08/24/2023)   Hunger Vital Sign    Worried About Running Out of Food in the Last Year: Never true    Ran Out of Food in the Last Year: Never true  Transportation Needs: No Transportation Needs (08/24/2023)   PRAPARE - Administrator, Civil Service (Medical): No    Lack of Transportation (Non-Medical): No  Physical Activity: Not on file  Stress: Not on file  Social Connections: Not on file    High Risk Criteria for Readmission and/or Poor Patient Outcomes: Heart failure hospital admissions (last 6 months): 0  No Show rate: 15 % Difficult social situation: No Demonstrates medication adherence: No, no insurance, cost factor.  Primary Language: Spanish Literacy level: 5 th grade education, can read and write some.   Barriers of Care:   Medication compliance ( cost factor) Diet/ fluid restrictions/ daily weights  Considerations/Referrals:   Referral made to Heart Failure Pharmacist Stewardship: yes Referral made to Heart Failure CSW/NCM TOC: No Insurance ( follow up on patient assistance)  Referral made to Heart & Vascular TOC clinic: Yes,  F/U appointment with HF TOC on 09/06/2023 @ 8:30 am.   Items for Follow-up on DC/TOC: Medication compliance ( cost)  Patient assistance Diet/ fluid restrictions/ daily weights Continued HF education   Rhae Hammock, BSN, Charity fundraiser  Heart Failure Nurse Navigator Secure Chat Only

## 2023-08-26 ENCOUNTER — Other Ambulatory Visit (HOSPITAL_COMMUNITY): Payer: Self-pay

## 2023-08-26 LAB — PHOSPHORUS: Phosphorus: 2.9 mg/dL (ref 2.5–4.6)

## 2023-08-26 LAB — BASIC METABOLIC PANEL
Anion gap: 12 (ref 5–15)
BUN: 31 mg/dL — ABNORMAL HIGH (ref 8–23)
CO2: 22 mmol/L (ref 22–32)
Calcium: 8.4 mg/dL — ABNORMAL LOW (ref 8.9–10.3)
Chloride: 102 mmol/L (ref 98–111)
Creatinine, Ser: 1.31 mg/dL — ABNORMAL HIGH (ref 0.61–1.24)
GFR, Estimated: >60 mL/min (ref >60)
Glucose, Bld: 254 mg/dL — ABNORMAL HIGH (ref 70–99)
Potassium: 3.7 mmol/L (ref 3.5–5.1)
Sodium: 136 mmol/L (ref 135–145)

## 2023-08-26 LAB — MAGNESIUM: Magnesium: 2 mg/dL (ref 1.7–2.4)

## 2023-08-26 LAB — GLUCOSE, CAPILLARY: Glucose-Capillary: 115 mg/dL — ABNORMAL HIGH (ref 70–99)

## 2023-08-26 LAB — LIPOPROTEIN A (LPA): Lipoprotein (a): 29 nmol/L (ref <75.0)

## 2023-08-26 MED ORDER — METOPROLOL SUCCINATE ER 25 MG PO TB24
12.5000 mg | ORAL_TABLET | Freq: Every day | ORAL | Status: DC
  Administered 2023-08-26: 12.5 mg via ORAL
  Filled 2023-08-26: qty 1

## 2023-08-26 MED ORDER — AMOXICILLIN-POT CLAVULANATE 875-125 MG PO TABS
1.0000 | ORAL_TABLET | Freq: Two times a day (BID) | ORAL | 0 refills | Status: AC
  Filled 2023-08-26: qty 6, 3d supply, fill #0

## 2023-08-26 MED ORDER — AZITHROMYCIN 250 MG PO TABS
250.0000 mg | ORAL_TABLET | Freq: Every day | ORAL | 0 refills | Status: AC
Start: 2023-08-26 — End: 2023-08-29
  Filled 2023-08-26: qty 3, 3d supply, fill #0

## 2023-08-26 MED ORDER — SACUBITRIL-VALSARTAN 24-26 MG PO TABS
1.0000 | ORAL_TABLET | Freq: Two times a day (BID) | ORAL | 0 refills | Status: DC
  Filled 2023-08-26: qty 60, 30d supply, fill #0

## 2023-08-26 MED ORDER — ACETAMINOPHEN 325 MG PO TABS: 650.0000 mg | ORAL_TABLET | ORAL | Status: DC | PRN

## 2023-08-26 MED ORDER — METOPROLOL SUCCINATE ER 25 MG PO TB24
12.5000 mg | ORAL_TABLET | Freq: Every day | ORAL | 0 refills | Status: DC
  Filled 2023-08-26: qty 15, 30d supply, fill #0

## 2023-08-26 MED ORDER — ATORVASTATIN CALCIUM 80 MG PO TABS
80.0000 mg | ORAL_TABLET | Freq: Every day | ORAL | 0 refills | Status: DC
  Filled 2023-08-26: qty 30, 30d supply, fill #0

## 2023-08-26 MED ORDER — APIXABAN 5 MG PO TABS
5.0000 mg | ORAL_TABLET | Freq: Two times a day (BID) | ORAL | Status: DC
  Administered 2023-08-26: 5 mg via ORAL
  Filled 2023-08-26: qty 1

## 2023-08-26 MED ORDER — EMPAGLIFLOZIN 10 MG PO TABS
10.0000 mg | ORAL_TABLET | Freq: Every day | ORAL | 0 refills | Status: DC
  Filled 2023-08-26: qty 30, 30d supply, fill #0

## 2023-08-26 MED ORDER — FUROSEMIDE 40 MG PO TABS
40.0000 mg | ORAL_TABLET | Freq: Every day | ORAL | 0 refills | Status: DC | PRN
  Filled 2023-08-26: qty 30, 30d supply, fill #0

## 2023-08-26 MED ORDER — APIXABAN 5 MG PO TABS
5.0000 mg | ORAL_TABLET | Freq: Two times a day (BID) | ORAL | 0 refills | Status: DC
  Filled 2023-08-26: qty 60, 30d supply, fill #0

## 2023-08-26 MED ORDER — ATORVASTATIN CALCIUM 20 MG PO TABS
20.0000 mg | ORAL_TABLET | Freq: Every day | ORAL | 0 refills | Status: DC
  Filled 2023-08-26: qty 30, 30d supply, fill #0

## 2023-08-26 NOTE — TOC Transition Note (Signed)
  MATCH MEDICATION ASSISTANCE CARD Pharmacies please call [phone] for claim processing assistance.  Rx BIN: [id number] Rx Group: [id number] Rx PCN: [id number] Relationship Code: 1 Person Code: [id number]  Patient ID (MRN): [id number]    Patient Name: Pedro Kemp   Patient DOB:1956/02/02   Discharge Date:08/26/2023  Expiration Date: 09/03/2023 (must be filled within 7 days of discharge)   Dear Bonita Quin have been approved to have the prescriptions written by your discharging physician filled through our [facility] (Medication Assistance Through [facility]) program. This program allows for a one-time (no refills) 34-day supply of selected medications for a low copay amount.  The copay is $3.00 per prescription. For instance, if you have one prescription, you will pay $3.00; for two prescriptions, you pay $6.00; for three prescriptions, you pay $9.00; and so on. Only certain pharmacies are participating in this program with [facility]. You will need to select one of the pharmacies from the attached lists and take your prescriptions, this letter, and your photo ID to one of the participating pharmacies.  We are excited that you are able to use the [facility] program to get your medications. These prescriptions must be filled within 7 days of hospital discharge or they will no longer be valid for the [facility] program. Should you have any problems with your prescriptions please contact your case management team member at [phone] for [place] or [phone] for [facility].  Thank you, [facility]    [facility] Program Pharmacies [place]. [facility], [facility], [facility]  [facility] [address] [place], [place] [address] [place], [place] [address] [place], [employer] [address], [address], [place] Other [facility] [address] [place], [place] [facility] [address]  [facility] [id number], [place]

## 2023-08-26 NOTE — TOC Transition Note (Signed)
Transition of Care [facility]) - CM/SW Discharge Note   Patient Details  Name: Pedro Kemp MRN: [id number] Date of Birth: 08/20/1956  Transition of Care [facility]) CM/SW Contact:  Harriet Masson, RN Phone Number: 08/26/2023, 12:29 PM   Clinical Narrative:    Patient stable for discharge. MATCH letter sent to [facility] pharmacy for discharge prescriptions. Patient has transport home. Follow up apts on AVS.    Final next level of care: Home/Self Care Barriers to Discharge: Barriers Resolved   Patient Goals and CMS Choice    Return home  Discharge Placement               home          Discharge Plan and Services Additional resources added to the After Visit Summary for     Discharge Planning Services: CM Consult, Medication Assistance, MATCH Program                                 Social Determinants of Health (SDOH) Interventions SDOH Screenings   Food Insecurity: No Food Insecurity (08/24/2023)  Housing: Low Risk  (08/24/2023)  Transportation Needs: No Transportation Needs (08/24/2023)  Utilities: Not At Risk (08/24/2023)  Alcohol Screen: Low Risk  (08/25/2023)  Depression (PHQ2-9): Low Risk  (11/11/2022)  Financial Resource Strain: High Risk (08/25/2023)  Tobacco Use: Low Risk  (08/24/2023)     Readmission Risk Interventions    08/26/2023   12:29 PM  Readmission Risk Prevention Plan  Transportation Screening Complete  PCP or Specialist Appt within 5-7 Days Not Complete  Not Complete comments follow up apt 11/18  Home Care Screening Complete  Medication Review (RN CM) Complete

## 2023-08-26 NOTE — Progress Notes (Signed)
Heart Failure Stewardship Pharmacist Progress Note   PCP: Grayce Sessions, NP PCP-Cardiologist: Maisie Fus, MD    HPI:  67 yo M with PMH of CHF, VT arrest s/p ICD, CAD s/p PCI, CKD IIIa, T2DM, and HLD.   He was admitted at [facility] 05/14/2021 to 05/22/2021. Presented with chest pain, noted to have high risk NSTEMI and acute CHF with EF 35%. He had troponin up to 15k. He had cath on 7/27 that showed 100% occluded mid LAD s/p shockwave and PCI. He also had disease in his diag s/p PCI. He subsequently had VT arrest 7/29 with ACLS and ROSC. He went back to the cath lab on 7/29 and stent was patent. This was concerning for scar mediated VT. He had an EP study on 8/2 with inducible sustained monomorphic VT. Managed with amiodarone. He underwent DC-ICD. He had a moderate sized hematoma at the ICD site requiring monitoring. He was also managed for acute respiratory failure 2/2 CHF exacerbation. SCr on discharge was 1.38. A1c was 9 %. He was discharged on asa 81 mg daily, atorvastatin 80 mg daily, plavix 75 mg daily, lisinopril 20 mg daily, lasix 80 mg daily, metop XL 50 mg daily. Started on Jardiance 10 mg.    Admitted in 01/2022 with acute on chronic HFrEF after stopping lasix as instructed by PCP. Diuresed with IV lasix. Continued on metoprolol and Jardiance, started on PO lasix at discharge.   Admitted in 10/2022 with acute on chronic HFrEF. Reported he ran out of medications 1 month prior. CXR with mild interstitial edema. ECHO 1/6 with LVEF 30-35%, global hypokinesis, RV normal. Discharged on furosemide 40 mg daily, Jardiance, Toprol, Entresto, and spironolactone.   He followed up with HF TOC clinic and his Sherryll Burger was increased to 49/51 mg BID. Unfortunately, Entresto and spironolactone were discontinued due to hyperkalemia.   Presented to the ED on 11/5 with shortness of breath after being out of medications again. CXR with findings favoring PNA over interstitial edema. BNP 684.5. Lactic acid  was 4.3 but improved to 1.5 following fluid resuscitation. ECHO 11/5 showed LVEF 30-35%, RWMA, mild asymmetric LVH, RV normal, moderate MR. Taken for [facility] on 11/5 and had patent mid LAD and diagonal stents. RA 8, PA 26, wedge 17, CO 3.6, CI 2.0.  Patient reports he is unsure how long he has been out of medications. 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   Current HF Medications: Diuretic: furosemide 40 mg IV BID ACE/ARB/ARNI: Entresto 24/26 mg BID SGLT2i: Jardiance 10 mg daily  Prior to admission HF Medications: Diuretic: furosemide 40 mg daily *was not taking metoprolol, Jardiance, or hydralazine  Pertinent Lab Values: Serum creatinine 1.23, BUN 28, Potassium 4.3, Sodium 137, BNP 684.5   Vital Signs: Weight: 143 lbs (admission weight: 150 lbs) Blood pressure: 110/60s  Heart rate: 60s  I/O: net -2.8L yesterday  Medication Assistance / Insurance Benefits Check: Does the patient have prescription insurance?  No  Does the patient qualify for medication assistance through manufacturers or grants?   Yes Eligible grants and/or patient assistance programs: Lorenza Chick Medication assistance applications in progress: Entresto  Medication assistance applications approved: Jardiance, Eliquis Approved medication assistance renewals will be completed by: [facility]  Patient has Jardiance assistance approved until 10/19/23 Patient has Eliquis patient assistance approved until 11/12/23  Patient has applied for [facility] patient assistance but the program needed a no-income attestation letter sent back to them. The program states they mailed  this out to him twice already but never received it back from the patient.   Outpatient Pharmacy:  Prior to admission outpatient pharmacy: [facility] Is the patient willing to use [facility] pharmacy at discharge? Yes  Assessment: 1. Acute on chronic systolic CHF (LVEF 30-35%), due to ICM and medication noncompliance.  NYHA class II symptoms. - On furosemide 40 mg IV BID. Volume improved. Consider transitioning to PO lasix. Strict I/Os and daily weights. Keep K>4 and Mg>2. - Consider adding BB now that he is more euvolemic. Prefer to have once daily BB since compliance may be an issue for him. Recommend low dose metoprolol XL.  - Continue Entresto 24/26 mg BID. Patient has been unable to stay on this in the past due to hyperkalemia and not returning proof of income documentation to patient assistance program. Potassium ok today.  - No spironolactone 2/2 history of hyperkalemia on this  - Continue Jardiance 10 mg daily   Plan: 1) Medication changes recommended at this time: - Change to PO lasix - Add low dose metoprolol XL  2) Patient assistance: - Patient needs to call for refills of Eliquis and Jardiance via the patient assistance program. Assisted patient in his room but was unsuccessful with both companies. The Jardiance representative said that we need the most recent Rx number but this information is not available to [place]. The Eliquis representative said that he cannot be currently admitted to request refills. They needed him to be physically discharged from the hospital. Will provide patient with the phone number to call for Eliquis refills (added to discharge instructions). He confirms that he will be able to do this. - Since his Jardiance assistance will be expiring soon, a renewal form was completed and signed by the patient. Sent to MD for signature/submission. - Patient will need to return proof of income documentation (3 recent pay stubs) to [employer] in order to receive Entresto. - Patient would greatly benefit from paramedicine given his multiple admissions for HF due to medication noncompliance. He does not have a great health literacy and it is difficult for him to understand how to obtain his medications from a patient assistance program. - May be able to provide him with samples on discharge  3)   Education  - Patient has been educated on current HF medications and potential additions to HF medication regimen - Patient verbalizes understanding that over the next few months, these medication doses may change and more medications may be added to optimize HF regimen - Patient has been educated on basic disease state pathophysiology and goals of therapy   Sharen Hones, PharmD, BCPS Heart Failure Stewardship Pharmacist Phone [phone]

## 2023-08-26 NOTE — Discharge Summary (Signed)
Physician Discharge Summary  Pedro Kemp UJW:[id number] DOB: September 04, 1956 DOA: 08/24/2023  PCP: Grayce Sessions, NP  Admit date: 08/24/2023 Discharge date: 08/26/2023  Admitted From: Home Disposition: Home  Recommendations for Outpatient Follow-up:  Follow up with PCP in 1-2 weeks Please obtain BMP/CBC in one week your next doctors visit.  3 more days of oral antibiotics to complete course Cardiac medication adjusted as mentioned below   Discharge Condition: Stable CODE STATUS: Full code Diet recommendation:  Heart healthy Brief/Interim Summary:  Brief Narrative:  67 year old with CHF EF 35%, VT arrest status post ICD, CAD status post PCI 2022, CKD stage IIIa, DM 2, HLD admitted for acute dyspnea chest x-ray showing multifocal infiltrate, placed on BiPAP.  Had elevated WBC therefore started on antibiotics.  Due to elevated troponin, cardiology was consulted.  Echocardiogram showed EF 35%, LHC 11/5 showed multivessel disease with no clear culprit lesion.  Ongoing medication adjustment per cardiology.  Eventually cardiac medications adjusted as mentioned above.     Assessment & Plan:  Principal Problem:   Multifocal pneumonia Active Problems:   NSTEMI (non-ST elevated myocardial infarction) (HCC)   Type 2 diabetes mellitus with hyperglycemia, without long-term current use of insulin (HCC)   Chronic HFrEF (heart failure with reduced ejection fraction) (HCC)   Hyperlipidemia associated with type 2 diabetes mellitus (HCC)   Essential hypertension   Coronary artery disease   History of cardiac arrest   Paroxysmal atrial fibrillation (HCC)   Stage 3a chronic kidney disease (HCC)   Acute respiratory failure with hypoxia (HCC)   Inability to urinate   Acute respiratory distress secondary to multifocal infiltrate Patient is significantly well.  Will give him 3 more days of oral Augmentin and azithromycin to finish the course.   NSTEMI History of CAD History of CHF with EF  35% ICD in place -Significantly elevated troponin.  Echocardiogram shows reduced EF 35%.  Status post LHC 11/5, multivessel disease but no clear culprit lesion.  Cardiology recommending medical management.  Currently on Lasix as needed, Toprol-XL 12.5 mg, Lipitor 80 mg, Jardiance, Eliquis twice daily.   Inability to urinate Renal ultrasound negative for any acute pathology   Stage 3a chronic kidney disease (HCC) Creatinine stable relative to baseline 1.3-1.6   Paroxysmal atrial fibrillation (HCC) Continue metoprolol and Eliquis.    History of cardiac arrest   Essential hypertension GDMT per cardiology   Hyperlipidemia associated with type 2 diabetes mellitus (HCC) - Continue Lipitor   Type 2 diabetes mellitus with hyperglycemia, without long-term current use of insulin (HCC) Resume home metformin.  On Jardiance.       DVT prophylaxis: Eliquis today Code Status: Full code Family Communication:   Status is: Inpatient Remains inpatient appropriate because: Will do discharge   Subjective: No complaints feeling well.   Examination:   General exam: Appears calm and comfortable  Respiratory system: Clear to auscultation. Respiratory effort normal. Cardiovascular system: S1 & S2 heard, RRR. No JVD, murmurs, rubs, gallops or clicks. No pedal edema. Gastrointestinal system: Abdomen is nondistended, soft and nontender. No organomegaly or masses felt. Normal bowel sounds heard. Central nervous system: Alert and oriented. No focal neurological deficits. Extremities: Symmetric 5 x 5 power. Skin: No rashes, lesions or ulcers Psychiatry: Judgement and insight appear normal. Mood & affect appropriate.       Discharge Diagnoses:  Principal Problem:   Multifocal pneumonia Active Problems:   NSTEMI (non-ST elevated myocardial infarction) (HCC)   Type 2 diabetes mellitus with hyperglycemia, without long-term current use of insulin (  HCC)   Chronic HFrEF (heart failure with reduced  ejection fraction) (HCC)   Hyperlipidemia associated with type 2 diabetes mellitus (HCC)   Essential hypertension   Coronary artery disease   History of cardiac arrest   Paroxysmal atrial fibrillation (HCC)   Stage 3a chronic kidney disease (HCC)   Acute respiratory failure with hypoxia (HCC)   Inability to urinate     Discharge Exam: Vitals:   08/26/23 0343 08/26/23 0813  BP: (!) 104/56 111/68  Pulse: 62 69  Resp: 20 19  Temp: 98.1 F (36.7 C) 98.1 F (36.7 C)  SpO2: 95% 93%   Vitals:   08/25/23 1929 08/25/23 2333 08/26/23 0343 08/26/23 0813  BP: 127/69 119/66 (!) 104/56 111/68  Pulse: 68 66 62 69  Resp: 20 14 20 19   Temp: 98 F (36.7 C) 98.3 F (36.8 C) 98.1 F (36.7 C) 98.1 F (36.7 C)  TempSrc: Oral Oral Oral Oral  SpO2: 93% 96% 95% 93%  Weight:   65.1 kg   Height:         Discharge Instructions  Discharge Instructions     AMB referral to Phase II Cardiac Rehabilitation   Complete by: As directed    Diagnosis: NSTEMI   After initial evaluation and assessments completed: Virtual Based Care may be provided alone or in conjunction with Phase 2 Cardiac Rehab based on patient barriers.: Yes   Intensive Cardiac Rehabilitation (ICR) [facility] location only OR Traditional Cardiac Rehabilitation (TCR) *If criteria for ICR are not met will enroll in TCR [facility] only): Yes      Allergies as of 08/26/2023   No Known Allergies      Medication List     STOP taking these medications    hydrALAZINE 25 MG tablet Commonly known as: APRESOLINE       TAKE these medications    amoxicillin-clavulanate 875-125 MG tablet Commonly known as: AUGMENTIN Take 1 tablet by mouth 2 (two) times daily for 3 days.   apixaban 5 MG Tabs tablet Commonly known as: ELIQUIS Take 1 tablet (5 mg total) by mouth 2 (two) times daily.   atorvastatin 80 MG tablet Commonly known as: LIPITOR Take 1 tablet (80 mg total) by mouth daily. What changed:  medication strength how much to take    azithromycin 250 MG tablet Commonly known as: Zithromax Take 1 tablet (250 mg total) by mouth daily for 3 days. Notes to patient: Stop after 11/10 dose   empagliflozin 10 MG Tabs tablet Commonly known as: Jardiance Tome 1 tableta (10 mg en total) por va oral diariamente antes de desayunar. (Take 1 tablet (10 mg total) by mouth daily before breakfast.)   furosemide 40 MG tablet Commonly known as: LASIX Take 1 tablet (40 mg total) by mouth daily as needed for fluid or edema. What changed:  when to take this reasons to take this   metFORMIN 500 MG 24 hr tablet Commonly known as: GLUCOPHAGE-XR Take 1 tablet (500 mg total) by mouth 2 (two) times daily with a meal.   metoprolol succinate 25 MG 24 hr tablet Commonly known as: TOPROL-XL Take 0.5 tablets (12.5 mg total) by mouth daily. What changed:  medication strength how much to take additional instructions   sacubitril-valsartan 24-26 MG Commonly known as: ENTRESTO Take 1 tablet by mouth 2 (two) times daily.        Follow-up Information     [facility]. Go in 10 day(s).   Specialty: Cardiology Why:  Hospital follow up 09/06/2023 @ 8:30 am PLEASE bring a current medication list to appointment FREE valet parking, Entrance C, off [employer] information: [address] [place] [address] [phone]        Grayce Sessions, NP Follow up in 1 week(s).   Specialty: Internal Medicine Why: Nov. 26, 2024 @2 :10PM Contact information: [address] [place] [address] [phone]                No Known Allergies  You were cared for by a hospitalist during your hospital stay. If you have any questions about your discharge medications or the care you received while you were in the hospital after you are discharged, you can call the unit and asked to speak with the hospitalist on call if the hospitalist that took care of you  is not available. Once you are discharged, your primary care physician will handle any further medical issues. Please note that no refills for any discharge medications will be authorized once you are discharged, as it is imperative that you return to your primary care physician (or establish a relationship with a primary care physician if you do not have one) for your aftercare needs so that they can reassess your need for medications and monitor your lab values.  You were cared for by a hospitalist during your hospital stay. If you have any questions about your discharge medications or the care you received while you were in the hospital after you are discharged, you can call the unit and asked to speak with the hospitalist on call if the hospitalist that took care of you is not available. Once you are discharged, your primary care physician will handle any further medical issues. Please note that NO REFILLS for any discharge medications will be authorized once you are discharged, as it is imperative that you return to your primary care physician (or establish a relationship with a primary care physician if you do not have one) for your aftercare needs so that they can reassess your need for medications and monitor your lab values.  Please request your Prim.MD to go over all Hospital Tests and Procedure/Radiological results at the follow up, please get all Hospital records sent to your Prim MD by signing hospital release before you go home.  Get CBC, CMP, 2 view Chest X ray checked  by Primary MD during your next visit or SNF MD in 5-7 days ( we routinely change or add medications that can affect your baseline labs and fluid status, therefore we recommend that you get the mentioned basic workup next visit with your PCP, your PCP may decide not to get them or add new tests based on their clinical decision)  On your next visit with your primary care physician please Get Medicines reviewed and adjusted.  If  you experience worsening of your admission symptoms, develop shortness of breath, life threatening emergency, suicidal or homicidal thoughts you must seek medical attention immediately by calling 911 or calling your MD immediately  if symptoms less severe.  You Must read complete instructions/literature along with all the possible adverse reactions/side effects for all the Medicines you take and that have been prescribed to you. Take any new Medicines after you have completely understood and accpet all the possible adverse reactions/side effects.   Do not drive, operate heavy machinery, perform activities at heights, swimming or participation in water activities or provide baby sitting services if your were admitted for syncope or siezures until  you have seen by Primary MD or a Neurologist and advised to do so again.  Do not drive when taking Pain medications.   Procedures/Studies: CARDIAC CATHETERIZATION  Result Date: 08/24/2023   Prox RCA lesion is 60% stenosed.   Mid RCA lesion is 50% stenosed.   Dist RCA lesion is 80% stenosed.   Mid LM to Dist LM lesion is 20% stenosed.   Prox Cx to Mid Cx lesion is 80% stenosed.   2nd Mrg lesion is 99% stenosed.   1st Diag-2 lesion is 5% stenosed.   1st Diag-1 lesion is 80% stenosed.   RPDA lesion is 50% stenosed.   Previously placed Mid LAD stent of unknown type is  widely patent. 1.  Patent mid LAD and diagonal stents. 2.  High-grade ostial diagonal disease. 3.  High-grade mid left circumflex disease; this is a very small vessel and feeds a subtotally occluded obtuse marginal. 4.  Moderate diffuse disease of the right coronary artery with an 80% lesion of the distal RCA. 5.  Fick cardiac output of 3.6 L/min and Fick cardiac index of 2.0 L/min/m with the following hemodynamics:  RA pressure mean 8 mmHg  RV pressure 37/-2 with an end-diastolic pressure of 11 mmHg  Wedge pressure mean of 17 mmHg  PA pressure 36/15 with a mean of 26 mmHg  PVR of 2.3 Woods units  PA  pulsatility index of 2.6 6.  LVEDP of 27 to 30 mmHg Recommendation: The images were reviewed with Dr. Rosemary Holms.  There does not seem to be a clear culprit lesion.  At this point in time we will treat medically.   ECHOCARDIOGRAM COMPLETE  Result Date: 08/24/2023    ECHOCARDIOGRAM REPORT   Patient Name:   Pedro Kemp Date of Exam: 08/24/2023 Medical Rec #:  [id number]         Height:       64.0 in Accession #:    [id number]        Weight:       150.4 lb Date of Birth:  09/03/1956         BSA:          1.733 m Patient Age:    67 years          BP:           133/73 mmHg Patient Gender: M                 HR:           79 bpm. Exam Location:  Inpatient Procedure: 2D Echo, Color Doppler and Cardiac Doppler STAT ECHO Indications:    Chest Pain/NSTEMI  History:        Patient has prior history of Echocardiogram examinations, most                 recent 10/24/2022. CHF, NSTEMI and CAD, Defibrillator, CKD,                 Arrythmias:Atrial Fibrillation, Signs/Symptoms:Chest Pain; Risk                 Factors:Diabetes, Dyslipidemia and Hypertension.  Sonographer:    Milbert Coulter Referring Phys: [id number] Perlie Gold IMPRESSIONS  1. No obvious LV thrombus on images obtained, would recommend limited contrast study to exclude thrombus. Left ventricular ejection fraction, by estimation, is 30 to 35%. The left ventricle has moderately decreased function. The left ventricle demonstrates regional wall motion abnormalities (see scoring diagram/findings for description). There is mild asymmetric left  ventricular hypertrophy of the basal-septal segment. Indeterminate diastolic filling due to E-A fusion.  2. Right ventricular systolic function is normal. The right ventricular size is normal.  3. Left atrial size was mildly dilated.  4. The mitral valve is grossly normal. Moderate mitral valve regurgitation. No evidence of mitral stenosis.  5. The aortic valve is tricuspid. Aortic valve regurgitation is not visualized. No aortic  stenosis is present. Comparison(s): No significant change from prior study. WMA was present on prior study. Conclusion(s)/Recommendation(s): Findings consistent with ischemic cardiomyopathy. FINDINGS  Left Ventricle: No obvious LV thrombus on images obtained, would recommend limited contrast study to exclude thrombus. Left ventricular ejection fraction, by estimation, is 30 to 35%. The left ventricle has moderately decreased function. The left ventricle demonstrates regional wall motion abnormalities. The left ventricular internal cavity size was normal in size. There is mild asymmetric left ventricular hypertrophy of the basal-septal segment. Indeterminate diastolic filling due to E-A fusion.  LV Wall Scoring: The mid and distal anterior septum, apical anterior segment, and apex are akinetic. Right Ventricle: The right ventricular size is normal. No increase in right ventricular wall thickness. Right ventricular systolic function is normal. Left Atrium: Left atrial size was mildly dilated. Right Atrium: Right atrial size was normal in size. Pericardium: There is no evidence of pericardial effusion. Mitral Valve: The mitral valve is grossly normal. Moderate mitral valve regurgitation. No evidence of mitral valve stenosis. Tricuspid Valve: The tricuspid valve is grossly normal. Tricuspid valve regurgitation is mild . No evidence of tricuspid stenosis. Aortic Valve: The aortic valve is tricuspid. Aortic valve regurgitation is not visualized. No aortic stenosis is present. Aortic valve mean gradient measures 3.0 mmHg. Aortic valve peak gradient measures 4.3 mmHg. Aortic valve area, by VTI measures 1.95 cm. Pulmonic Valve: The pulmonic valve was grossly normal. Pulmonic valve regurgitation is trivial. No evidence of pulmonic stenosis. Aorta: The aortic root and ascending aorta are structurally normal, with no evidence of dilitation. Venous: The inferior vena cava was not well visualized. IAS/Shunts: The atrial septum  is grossly normal. Additional Comments: A device lead is visualized in the right atrium and right ventricle.  LEFT VENTRICLE PLAX 2D LVIDd:         5.60 cm   Diastology LVIDs:         4.40 cm   LV e' medial:    6.31 cm/s LV PW:         0.90 cm   LV E/e' medial:  17.3 LV IVS:        1.30 cm   LV e' lateral:   6.85 cm/s LVOT diam:     2.00 cm   LV E/e' lateral: 15.9 LV SV:         37 LV SV Index:   21 LVOT Area:     3.14 cm  RIGHT VENTRICLE RV Basal diam:  2.90 cm RV Mid diam:    2.70 cm RV S prime:     12.00 cm/s TAPSE (M-mode): 2.2 cm LEFT ATRIUM              Index        RIGHT ATRIUM           Index LA diam:        4.90 cm  2.83 cm/m   RA Area:     14.60 cm LA Vol (A2C):   102.0 ml 58.86 ml/m  RA Volume:   37.10 ml  21.41 ml/m LA Vol (A4C):   44.5 ml  25.68 ml/m LA Biplane Vol: 69.0 ml  39.82 ml/m  AORTIC VALVE AV Area (Vmax):    1.97 cm AV Area (Vmean):   1.70 cm AV Area (VTI):     1.95 cm AV Vmax:           104.00 cm/s AV Vmean:          77.400 cm/s AV VTI:            0.190 m AV Peak Grad:      4.3 mmHg AV Mean Grad:      3.0 mmHg LVOT Vmax:         65.30 cm/s LVOT Vmean:        41.900 cm/s LVOT VTI:          0.118 m LVOT/AV VTI ratio: 0.62  AORTA Ao Root diam: 3.10 cm Ao Asc diam:  3.40 cm MITRAL VALVE                  TRICUSPID VALVE MV Area (PHT): 5.42 cm       TR Peak grad:   33.2 mmHg MV Decel Time: 140 msec       TR Vmax:        288.00 cm/s MR Peak grad:    104.0 mmHg MR Mean grad:    70.0 mmHg    SHUNTS MR Vmax:         510.00 cm/s  Systemic VTI:  0.12 m MR Vmean:        400.0 cm/s   Systemic Diam: 2.00 cm MR PISA:         2.26 cm MR PISA Eff ROA: 14 mm MR PISA Radius:  0.60 cm MV E velocity: 109.00 cm/s MV A velocity: 50.60 cm/s MV E/A ratio:  2.15 Lennie Odor MD Electronically signed by Lennie Odor MD Signature Date/Time: 08/24/2023/3:10:23 PM    Final    US RENAL  Result Date: 08/24/2023 CLINICAL DATA:  Inability to urinate EXAM: RENAL / URINARY TRACT ULTRASOUND COMPLETE COMPARISON:   None Available. FINDINGS: Right Kidney: Renal measurements: 10.6 x 6.0 x 7.2 cm = volume: 239 mL. Echogenicity within normal limits. No mass or hydronephrosis visualized. Left Kidney: Renal measurements: 14.0 x 6.6 x 6.2 cm = volume: 322 mL. Echogenicity within normal limits. No mass or hydronephrosis visualized. Bladder: Appears normal for degree of bladder distention. Other: Mild prostatomegaly, volume 44 mL. Small bilateral pleural effusions. IMPRESSION: 1. No hydronephrosis.  Urinary bladder nondistended. 2. Mild prostatomegaly. 3. Small bilateral pleural effusions. Electronically Signed   By: Jearld Lesch M.D.   On: 08/24/2023 11:50   DG Chest Port 1 View  Result Date: 08/24/2023 CLINICAL DATA:  Shortness of breath EXAM: PORTABLE CHEST 1 VIEW COMPARISON:  02/11/2023 FINDINGS: Interstitial and airspace opacities in the lungs bilaterally, favoring multifocal pneumonia over interstitial edema. No definite pleural effusions. The heart is normal in size.  Left subclavian ICD. IMPRESSION: Interstitial and airspace opacities in the lungs bilaterally, favoring multifocal pneumonia over interstitial edema. Electronically Signed   By: Charline Bills M.D.   On: 08/24/2023 03:35   CUP PACEART REMOTE DEVICE CHECK  Result Date: 08/20/2023 Scheduled remote reviewed. Normal device function.  5 AF events, longest duration 8hrs , poor rate control 1 SVT, AF with rapid response, in duration, HR 176, Metoprolol per EPIC Burden 1.2%, Eliquis per EPIC Next remote 91 days. LA, CVRS    The results of significant diagnostics from this hospitalization (including imaging, microbiology, ancillary and laboratory) are  listed below for reference.     Microbiology: Recent Results (from the past 240 hour(s))  Resp panel by RT-PCR (RSV, Flu A&B, Covid) Anterior Nasal Swab     Status: None   Collection Time: 08/24/23  3:33 AM   Specimen: Anterior Nasal Swab  Result Value Ref Range Status   SARS Coronavirus 2 by  RT PCR NEGATIVE NEGATIVE Final   Influenza A by PCR NEGATIVE NEGATIVE Final   Influenza B by PCR NEGATIVE NEGATIVE Final    Comment: (NOTE) The Xpert Xpress SARS-CoV-2/FLU/RSV plus assay is intended as an aid in the diagnosis of influenza from Nasopharyngeal swab specimens and should not be used as a sole basis for treatment. Nasal washings and aspirates are unacceptable for Xpert Xpress SARS-CoV-2/FLU/RSV testing.  Fact Sheet for Patients: BloggerCourse.com  Fact Sheet for Healthcare Providers: SeriousBroker.it  This test is not yet approved or cleared by the [place] FDA and has been authorized for detection and/or diagnosis of SARS-CoV-2 by FDA under an Emergency Use Authorization (EUA). This EUA will remain in effect (meaning this test can be used) for the duration of the COVID-19 declaration under Section 564(b)(1) of the Act, 21 U.S.C. section 360bbb-3(b)(1), unless the authorization is terminated or revoked.     Resp Syncytial Virus by PCR NEGATIVE NEGATIVE Final    Comment: (NOTE) Fact Sheet for Patients: BloggerCourse.com  Fact Sheet for Healthcare Providers: SeriousBroker.it  This test is not yet approved or cleared by the [place] FDA and has been authorized for detection and/or diagnosis of SARS-CoV-2 by FDA under an Emergency Use Authorization (EUA). This EUA will remain in effect (meaning this test can be used) for the duration of the COVID-19 declaration under Section 564(b)(1) of the Act, 21 U.S.C. section 360bbb-3(b)(1), unless the authorization is terminated or revoked.  Performed at [facility], [address], [place], [place] [address]   Culture, blood (Routine X 2) w Reflex to ID Panel     Status: None (Preliminary result)   Collection Time: 08/24/23  4:38 AM   Specimen: BLOOD  Result Value Ref Range Status   Specimen Description  BLOOD BLOOD RIGHT ARM  Final   Special Requests   Final    BOTTLES DRAWN AEROBIC AND ANAEROBIC Blood Culture adequate volume   Culture   Final    NO GROWTH 2 DAYS Performed at [facility], [address], [place], [place] [address]    Report Status PENDING  Incomplete  Culture, blood (Routine X 2) w Reflex to ID Panel     Status: None (Preliminary result)   Collection Time: 08/24/23  4:39 AM   Specimen: BLOOD LEFT ARM  Result Value Ref Range Status   Specimen Description BLOOD LEFT ARM  Final   Special Requests   Final    BOTTLES DRAWN AEROBIC ONLY Blood Culture results may not be optimal due to an inadequate volume of blood received in culture bottles   Culture   Final    NO GROWTH 2 DAYS Performed at [facility], [address], [place], [place] [address]    Report Status PENDING  Incomplete     Labs: BNP (last 3 results) Recent Labs    11/20/22 1116 02/11/23 0439 08/24/23 0310  BNP 360.2* 857.7* 684.5*   Basic Metabolic Panel: Recent Labs  Lab 08/24/23 0310 08/24/23 0316 08/24/23 1600 08/24/23 1602 08/24/23 1603 08/25/23 0929 08/26/23 0930  NA 138 142 141 140 140 137 136  K 3.5 3.4*  3.8 4.1 3.9 4.3 3.7  CL 108 108  --   --   --  104 102  CO2 21*  --   --   --   --  20* 22  GLUCOSE 248* 243*  --   --   --  145* 254*  BUN 24* 27*  --   --   --  28* 31*  CREATININE 1.48* 1.40*  --   --   --  1.23 1.31*  CALCIUM 8.6*  --   --   --   --  8.3* 8.4*  MG  --   --   --   --   --   --  2.0  PHOS  --   --   --   --   --   --  2.9   Liver Function Tests: Recent Labs  Lab 08/24/23 0310 08/25/23 0929  AST 25 30  ALT 21 18  ALKPHOS 89 50  BILITOT 0.7 0.6  PROT 7.1 5.9*  ALBUMIN 3.6 2.8*   No results for input(s): "LIPASE", "AMYLASE" in the last 168 hours. No results for input(s): "AMMONIA" in the last 168 hours. CBC: Recent Labs  Lab 08/24/23 0310 08/24/23 0316 08/24/23 1600 08/24/23 1602 08/24/23 1603 08/25/23 0929  WBC 24.1*  --   --   --    --  11.0*  NEUTROABS 9.2*  --   --   --   --   --   HGB 16.4 17.7* 13.9 13.9 13.9 13.3  HCT 51.5 52.0 41.0 41.0 41.0 39.9  MCV 92.3  --   --   --   --  89.7  PLT 192  --   --   --   --  138*   Cardiac Enzymes: No results for input(s): "CKTOTAL", "CKMB", "CKMBINDEX", "TROPONINI" in the last 168 hours. BNP: Invalid input(s): "POCBNP" CBG: Recent Labs  Lab 08/25/23 1107 08/25/23 1530 08/25/23 1716 08/25/23 2102 08/26/23 0611  GLUCAP 119* 131* 108* 156* 115*   D-Dimer No results for input(s): "DDIMER" in the last 72 hours. Hgb A1c Recent Labs    08/25/23 0929  HGBA1C 7.7*   Lipid Profile No results for input(s): "CHOL", "HDL", "LDLCALC", "TRIG", "CHOLHDL", "LDLDIRECT" in the last 72 hours. Thyroid function studies No results for input(s): "TSH", "T4TOTAL", "T3FREE", "THYROIDAB" in the last 72 hours.  Invalid input(s): "FREET3" Anemia work up No results for input(s): "VITAMINB12", "FOLATE", "FERRITIN", "TIBC", "IRON", "RETICCTPCT" in the last 72 hours. Urinalysis No results found for: "COLORURINE", "APPEARANCEUR", "LABSPEC", "PHURINE", "GLUCOSEU", "HGBUR", "BILIRUBINUR", "KETONESUR", "PROTEINUR", "UROBILINOGEN", "NITRITE", "LEUKOCYTESUR" Sepsis Labs Recent Labs  Lab 08/24/23 0310 08/25/23 0929  WBC 24.1* 11.0*   Microbiology Recent Results (from the past 240 hour(s))  Resp panel by RT-PCR (RSV, Flu A&B, Covid) Anterior Nasal Swab     Status: None   Collection Time: 08/24/23  3:33 AM   Specimen: Anterior Nasal Swab  Result Value Ref Range Status   SARS Coronavirus 2 by RT PCR NEGATIVE NEGATIVE Final   Influenza A by PCR NEGATIVE NEGATIVE Final   Influenza B by PCR NEGATIVE NEGATIVE Final    Comment: (NOTE) The Xpert Xpress SARS-CoV-2/FLU/RSV plus assay is intended as an aid in the diagnosis of influenza from Nasopharyngeal swab specimens and should not be used as a sole basis for treatment. Nasal washings and aspirates are unacceptable for Xpert Xpress  SARS-CoV-2/FLU/RSV testing.  Fact Sheet for Patients: BloggerCourse.com  Fact Sheet for Healthcare Providers: SeriousBroker.it  This test is not  yet approved or cleared by the [place] and has been authorized for detection and/or diagnosis of SARS-CoV-2 by FDA under an Emergency Use Authorization (EUA). This EUA will remain in effect (meaning this test can be used) for the duration of the COVID-19 declaration under Section 564(b)(1) of the Act, 21 U.S.C. section 360bbb-3(b)(1), unless the authorization is terminated or revoked.     Resp Syncytial Virus by PCR NEGATIVE NEGATIVE Final    Comment: (NOTE) Fact Sheet for Patients: BloggerCourse.com  Fact Sheet for Healthcare Providers: SeriousBroker.it  This test is not yet approved or cleared by the [place] FDA and has been authorized for detection and/or diagnosis of SARS-CoV-2 by FDA under an Emergency Use Authorization (EUA). This EUA will remain in effect (meaning this test can be used) for the duration of the COVID-19 declaration under Section 564(b)(1) of the Act, 21 U.S.C. section 360bbb-3(b)(1), unless the authorization is terminated or revoked.  Performed at [facility], [address] [place], [place] [address]   Culture, blood (Routine X 2) w Reflex to ID Panel     Status: None (Preliminary result)   Collection Time: 08/24/23  4:38 AM   Specimen: BLOOD  Result Value Ref Range Status   Specimen Description BLOOD BLOOD RIGHT ARM  Final   Special Requests   Final    BOTTLES DRAWN AEROBIC AND ANAEROBIC Blood Culture adequate volume   Culture   Final    NO GROWTH 2 DAYS Performed at [facility], [address], [place], [place] [address]    Report Status PENDING  Incomplete  Culture, blood (Routine X 2) w Reflex to ID Panel     Status: None (Preliminary result)   Collection Time: 08/24/23   4:39 AM   Specimen: BLOOD LEFT ARM  Result Value Ref Range Status   Specimen Description BLOOD LEFT ARM  Final   Special Requests   Final    BOTTLES DRAWN AEROBIC ONLY Blood Culture results may not be optimal due to an inadequate volume of blood received in culture bottles   Culture   Final    NO GROWTH 2 DAYS Performed at [facility], [address] [place], [place] [address]    Report Status PENDING  Incomplete     Time coordinating discharge:  I have spent 35 minutes face to face with the patient and on the ward discussing the patients care, assessment, plan and disposition with other care givers. >50% of the time was devoted counseling the patient about the risks and benefits of treatment/Discharge disposition and coordinating care.   SIGNED:   Miguel Rota, MD  [facility] 08/26/2023, 11:49 AM   If 7PM-7AM, please contact night-coverage

## 2023-08-26 NOTE — Progress Notes (Signed)
Patient Name: Pedro Kemp Date of Encounter: 08/26/2023 [facility] Cardiologist: Maisie Fus, MD   Interval Summary  .    Spanish interpreter services used. No chest pain, breathing improved.  Has no insurance, has difficulty getting medications.  Heart failure transitions team assisting.  Vital Signs .    Vitals:   08/25/23 1929 08/25/23 2333 08/26/23 0343 08/26/23 0813  BP: 127/69 119/66 (!) 104/56 111/68  Pulse: 68 66 62 69  Resp: 20 14 20 19   Temp: 98 F (36.7 C) 98.3 F (36.8 C) 98.1 F (36.7 C) 98.1 F (36.7 C)  TempSrc: Oral Oral Oral Oral  SpO2: 93% 96% 95% 93%  Weight:   65.1 kg   Height:        Intake/Output Summary (Last 24 hours) at 08/26/2023 0852 Last data filed at 08/26/2023 0445 Gross per 24 hour  Intake 714.14 ml  Output 4500 ml  Net -3785.86 ml      08/26/2023    3:43 AM 08/25/2023    4:30 AM 08/24/2023   12:00 PM  Last 3 Weights  Weight (lbs) 143 lb 8.3 oz 150 lb 5.7 oz 150 lb 5.7 oz  Weight (kg) 65.1 kg 68.2 kg 68.2 kg      Telemetry/ECG    08/25/2023: - Personally Reviewed Atrial pacing  Coronary angiogram 08/24/2023 (Dr. Lynnette Caffey): Personally reviewed and independently interpreted by me   Prox RCA lesion is 60% stenosed.   Mid RCA lesion is 50% stenosed.   Dist RCA lesion is 80% stenosed.   Mid LM to Dist LM lesion is 20% stenosed.   Prox Cx to Mid Cx lesion is 80% stenosed.   2nd Mrg lesion is 99% stenosed.   1st Diag-2 lesion is 5% stenosed.   1st Diag-1 lesion is 80% stenosed.   RPDA lesion is 50% stenosed.   Previously placed Mid LAD stent of unknown type is  widely patent.   1.  Patent mid LAD and diagonal stents. 2.  High-grade ostial diagonal disease. 3.  High-grade mid left circumflex disease; this is a very small vessel and feeds a subtotally occluded obtuse marginal. 4.  Moderate diffuse disease of the right coronary artery with an 80% lesion of the distal RCA. 5.  Fick cardiac output of 3.6 L/min  and Fick cardiac index of 2.0 L/min/m with the following hemodynamics:             RA pressure mean 8 mmHg             RV pressure 37/-2 with an end-diastolic pressure of 11 mmHg             Wedge pressure mean of 17 mmHg             PA pressure 36/15 with a mean of 26 mmHg             PVR of 2.3 Woods units             PA pulsatility index of 2.6 6.  LVEDP of 27 to 30 mmHg   Recommendation: The images were reviewed with Dr. Rosemary Holms.  There does not seem to be a clear culprit lesion.  At this point in time we will treat medically.    Physical Exam .   Physical Exam Vitals and nursing note reviewed.  Constitutional:      General: He is not in acute distress. Neck:     Vascular: No JVD.  Cardiovascular:     Rate  and Rhythm: Normal rate and regular rhythm.     Heart sounds: Normal heart sounds. No murmur heard. Pulmonary:     Effort: Pulmonary effort is normal.     Breath sounds: No wheezing or rales.  Musculoskeletal:     Right lower leg: No edema.     Left lower leg: No edema.      Assessment & Plan .     67 year old Hispanic male with known hypertension, NIDDM, CAD, s/p VF arrest due to ACS in 2022 treated with mid LAD PCI, s/p ICD, paroxysmal A-fib, admitted with pulmonary edema, HFrEF  Pulmonary edema: Multivessel CAD with critical LM/prox LAD disease Presentation felt to be more due to pulnmonary edema in the setting untreated HfrEF than acute culprit vessel related NSTEMI 4.5 L urine output in last 24 hrs, net -2.2 L. Currently on IV lasix 40 mg bid. Will stop diuretics today. May need prn lasix 40 mg on discharge. H/o hyperkalemia and type IV RTA. Will avoid spironolactone. Started on Entresto 24-26 mg bid on 11/6, tolerating so far without hyperkalemia, Cr improved to 1.23. Will add metoprolol succinate 12.5 mg today. Continue Jardiance 10 mg daily. Antibiotic management for possible pneumonia as per the primary team.  NSTEMI: Peak trop 5000s No acute  culprit lesion found on cath 11/5. Continue medical management for CAD. Continue Aspirin, statin, adding metoprolol today. Stop IV heparin.  Paroxysmal Afib: Currently in sinus/atrial paced rhythm. High CHA2DS2VAsc score.  Continue Eliquis 5 mg bid. Adding metoprolol succinate 12.5 mg daily.   NIDDM: SSI. Resume metformin on discharge.  Appreciate input from heart failure transition team.  From cardiac standpoint, okay to discharge today. Will arrange outpatient f/u.  Discussed above recommendations with primary team.  For questions or updates, please contact [facility] Please consult [email or web address] for contact info under        Signed, Elder Negus, MD

## 2023-08-26 NOTE — Discharge Instructions (Signed)
Medications: Call [phone] to request a refill of your Eliquis.  Jardiance assistance renewal application is being submitted

## 2023-08-26 NOTE — Plan of Care (Signed)
Problem: Education: Goal: Ability to demonstrate management of disease process will improve Outcome: Adequate for Discharge Goal: Ability to verbalize understanding of medication therapies will improve Outcome: Adequate for Discharge Goal: Individualized Educational Video(s) Outcome: Adequate for Discharge   Problem: Activity: Goal: Capacity to carry out activities will improve Outcome: Adequate for Discharge   Problem: Cardiac: Goal: Ability to achieve and maintain adequate cardiopulmonary perfusion will improve Outcome: Adequate for Discharge   Problem: Activity: Goal: Ability to tolerate increased activity will improve Outcome: Adequate for Discharge   Problem: Clinical Measurements: Goal: Ability to maintain a body temperature in the normal range will improve Outcome: Adequate for Discharge   Problem: Respiratory: Goal: Ability to maintain adequate ventilation will improve Outcome: Adequate for Discharge Goal: Ability to maintain a clear airway will improve Outcome: Adequate for Discharge   Problem: Education: Goal: Ability to describe self-care measures that may prevent or decrease complications (Diabetes Survival Skills Education) will improve Outcome: Adequate for Discharge Goal: Individualized Educational Video(s) Outcome: Adequate for Discharge   Problem: Coping: Goal: Ability to adjust to condition or change in health will improve Outcome: Adequate for Discharge   Problem: Fluid Volume: Goal: Ability to maintain a balanced intake and output will improve Outcome: Adequate for Discharge   Problem: Health Behavior/Discharge Planning: Goal: Ability to identify and utilize available resources and services will improve Outcome: Adequate for Discharge Goal: Ability to manage health-related needs will improve Outcome: Adequate for Discharge   Problem: Metabolic: Goal: Ability to maintain appropriate glucose levels will improve Outcome: Adequate for Discharge    Problem: Nutritional: Goal: Maintenance of adequate nutrition will improve Outcome: Adequate for Discharge Goal: Progress toward achieving an optimal weight will improve Outcome: Adequate for Discharge   Problem: Skin Integrity: Goal: Risk for impaired skin integrity will decrease Outcome: Adequate for Discharge   Problem: Tissue Perfusion: Goal: Adequacy of tissue perfusion will improve Outcome: Adequate for Discharge   Problem: Education: Goal: Knowledge of General Education information will improve Description: Including pain rating scale, medication(s)/side effects and non-pharmacologic comfort measures Outcome: Adequate for Discharge   Problem: Health Behavior/Discharge Planning: Goal: Ability to manage health-related needs will improve Outcome: Adequate for Discharge   Problem: Clinical Measurements: Goal: Ability to maintain clinical measurements within normal limits will improve Outcome: Adequate for Discharge Goal: Will remain free from infection Outcome: Adequate for Discharge Goal: Diagnostic test results will improve Outcome: Adequate for Discharge Goal: Respiratory complications will improve Outcome: Adequate for Discharge Goal: Cardiovascular complication will be avoided Outcome: Adequate for Discharge   Problem: Activity: Goal: Risk for activity intolerance will decrease Outcome: Adequate for Discharge   Problem: Nutrition: Goal: Adequate nutrition will be maintained Outcome: Adequate for Discharge   Problem: Coping: Goal: Level of anxiety will decrease Outcome: Adequate for Discharge   Problem: Elimination: Goal: Will not experience complications related to bowel motility Outcome: Adequate for Discharge Goal: Will not experience complications related to urinary retention Outcome: Adequate for Discharge   Problem: Pain Management: Goal: General experience of comfort will improve Outcome: Adequate for Discharge   Problem: Safety: Goal: Ability  to remain free from injury will improve Outcome: Adequate for Discharge   Problem: Skin Integrity: Goal: Risk for impaired skin integrity will decrease Outcome: Adequate for Discharge   Problem: Education: Goal: Understanding of CV disease, CV risk reduction, and recovery process will improve Outcome: Adequate for Discharge Goal: Individualized Educational Video(s) Outcome: Adequate for Discharge   Problem: Activity: Goal: Ability to return to baseline activity level will improve Outcome: Adequate  for Discharge   Problem: Cardiovascular: Goal: Ability to achieve and maintain adequate cardiovascular perfusion will improve Outcome: Adequate for Discharge Goal: Vascular access site(s) Level 0-1 will be maintained Outcome: Adequate for Discharge   Problem: Health Behavior/Discharge Planning: Goal: Ability to safely manage health-related needs after discharge will improve Outcome: Adequate for Discharge

## 2023-08-27 ENCOUNTER — Telehealth: Payer: Self-pay

## 2023-08-27 NOTE — Transitions of Care (Post Inpatient/ED Visit) (Signed)
   08/27/2023  Name: Pedro Kemp MRN: [id number] DOB: Jul 24, 1956  Today's TOC FU Call Status: Today's TOC FU Call Status:: Unsuccessful Call (1st Attempt) Unsuccessful Call (1st Attempt) Date: 08/27/23  Attempted to reach the patient regarding the most recent Inpatient/ED visit.  Follow Up Plan: Additional outreach attempts will be made to reach the patient to complete the Transitions of Care (Post Inpatient/ED visit) call.   Jodelle Gross RN, BSN, CCM RN Care Manager  Transitions of Care  [facility] - [facility]  [phone]

## 2023-08-29 LAB — CULTURE, BLOOD (ROUTINE X 2)
Culture: NO GROWTH
Culture: NO GROWTH
Special Requests: ADEQUATE

## 2023-08-30 ENCOUNTER — Telehealth: Payer: Self-pay

## 2023-08-30 NOTE — Transitions of Care (Post Inpatient/ED Visit) (Signed)
   08/30/2023  Name: Pedro Kemp MRN: [id number] DOB: 03-07-1956  Today's TOC FU Call Status: Today's TOC FU Call Status:: Unsuccessful Call (2nd Attempt) Unsuccessful Call (2nd Attempt) Date: 08/30/23  Attempted to reach the patient regarding the most recent Inpatient/ED visit.  Follow Up Plan: Additional outreach attempts will be made to reach the patient to complete the Transitions of Care (Post Inpatient/ED visit) call.   Jodelle Gross RN, BSN, CCM RN Care Manager  Transitions of Care  [facility] - [facility]  [phone]

## 2023-08-31 ENCOUNTER — Telehealth: Payer: Self-pay | Admitting: *Deleted

## 2023-08-31 NOTE — Transitions of Care (Post Inpatient/ED Visit) (Signed)
   08/31/2023  Name: Pedro Kemp MRN: [id number] DOB: 1956/09/07  Today's TOC FU Call Status: Today's TOC FU Call Status:: Unsuccessful Call (3rd Attempt) Unsuccessful Call (3rd Attempt) Date: 08/31/23  Attempted to reach the patient regarding the most recent Inpatient/ED visit. [employer] interpreter # [phone]  Follow Up Plan: No further outreach attempts will be made at this time. We have been unable to contact the patient.  Gean Maidens BSN RN Population Health- Transition of Care Team.  [facility] [phone]

## 2023-09-01 ENCOUNTER — Other Ambulatory Visit: Payer: Self-pay

## 2023-09-01 DIAGNOSIS — E119 Type 2 diabetes mellitus without complications: Secondary | ICD-10-CM

## 2023-09-01 DIAGNOSIS — I509 Heart failure, unspecified: Secondary | ICD-10-CM

## 2023-09-01 MED ORDER — EMPAGLIFLOZIN 10 MG PO TABS: 10.0000 mg | ORAL_TABLET | Freq: Every day | ORAL | 3 refills | Status: DC

## 2023-09-01 NOTE — Telephone Encounter (Signed)
Paperwork signed by Dr. Wyline Mood. Faxed to [phone] received confirmation.

## 2023-09-01 NOTE — Progress Notes (Signed)
Remote ICD transmission.   

## 2023-09-06 ENCOUNTER — Other Ambulatory Visit: Payer: Self-pay

## 2023-09-06 ENCOUNTER — Other Ambulatory Visit (HOSPITAL_COMMUNITY): Payer: Self-pay

## 2023-09-06 ENCOUNTER — Ambulatory Visit (HOSPITAL_COMMUNITY)
Admit: 2023-09-06 | Discharge: 2023-09-06 | Disposition: A | Discharge status: Home / Self Care | Payer: MEDICAID | Attending: Cardiology | Admitting: Cardiology

## 2023-09-06 ENCOUNTER — Encounter (HOSPITAL_COMMUNITY): Payer: Self-pay

## 2023-09-06 VITALS — BP 146/80 | HR 59 | Wt 152.0 lb

## 2023-09-06 DIAGNOSIS — Z8674 Personal history of sudden cardiac arrest: Secondary | ICD-10-CM | POA: Insufficient documentation

## 2023-09-06 DIAGNOSIS — I10 Essential (primary) hypertension: Secondary | ICD-10-CM

## 2023-09-06 DIAGNOSIS — I110 Hypertensive heart disease with heart failure: Secondary | ICD-10-CM | POA: Insufficient documentation

## 2023-09-06 DIAGNOSIS — Z7984 Long term (current) use of oral hypoglycemic drugs: Secondary | ICD-10-CM | POA: Insufficient documentation

## 2023-09-06 DIAGNOSIS — E119 Type 2 diabetes mellitus without complications: Secondary | ICD-10-CM | POA: Insufficient documentation

## 2023-09-06 DIAGNOSIS — I5022 Chronic systolic (congestive) heart failure: Secondary | ICD-10-CM | POA: Insufficient documentation

## 2023-09-06 DIAGNOSIS — Z79899 Other long term (current) drug therapy: Secondary | ICD-10-CM | POA: Insufficient documentation

## 2023-09-06 DIAGNOSIS — I340 Nonrheumatic mitral (valve) insufficiency: Secondary | ICD-10-CM | POA: Insufficient documentation

## 2023-09-06 DIAGNOSIS — Z7982 Long term (current) use of aspirin: Secondary | ICD-10-CM | POA: Insufficient documentation

## 2023-09-06 DIAGNOSIS — I252 Old myocardial infarction: Secondary | ICD-10-CM | POA: Insufficient documentation

## 2023-09-06 DIAGNOSIS — E875 Hyperkalemia: Secondary | ICD-10-CM | POA: Insufficient documentation

## 2023-09-06 DIAGNOSIS — Z95810 Presence of automatic (implantable) cardiac defibrillator: Secondary | ICD-10-CM | POA: Insufficient documentation

## 2023-09-06 DIAGNOSIS — I5042 Chronic combined systolic (congestive) and diastolic (congestive) heart failure: Secondary | ICD-10-CM

## 2023-09-06 DIAGNOSIS — I2510 Atherosclerotic heart disease of native coronary artery without angina pectoris: Secondary | ICD-10-CM | POA: Insufficient documentation

## 2023-09-06 DIAGNOSIS — F1010 Alcohol abuse, uncomplicated: Secondary | ICD-10-CM | POA: Insufficient documentation

## 2023-09-06 LAB — BASIC METABOLIC PANEL
Anion gap: 9 (ref 5–15)
BUN: 41 mg/dL — ABNORMAL HIGH (ref 8–23)
CO2: 22 mmol/L (ref 22–32)
Calcium: 9.6 mg/dL (ref 8.9–10.3)
Chloride: 105 mmol/L (ref 98–111)
Creatinine, Ser: 1.5 mg/dL — ABNORMAL HIGH (ref 0.61–1.24)
GFR, Estimated: 51 mL/min — ABNORMAL LOW (ref >60)
Glucose, Bld: 242 mg/dL — ABNORMAL HIGH (ref 70–99)
Potassium: 5.8 mmol/L — ABNORMAL HIGH (ref 3.5–5.1)
Sodium: 136 mmol/L (ref 135–145)

## 2023-09-06 LAB — BRAIN NATRIURETIC PEPTIDE: B Natriuretic Peptide: 147.5 pg/mL — ABNORMAL HIGH (ref 0.0–100.0)

## 2023-09-06 MED ORDER — HYDRALAZINE HCL 25 MG PO TABS
25.0000 mg | ORAL_TABLET | Freq: Three times a day (TID) | ORAL | 3 refills | Status: DC
  Filled 2023-09-06: qty 90, 30d supply, fill #0
  Filled 2023-10-25: qty 90, 30d supply, fill #1
  Filled 2023-12-17: qty 90, 30d supply, fill #2

## 2023-09-06 MED ORDER — FUROSEMIDE 40 MG PO TABS: 40.0000 mg | ORAL_TABLET | Freq: Two times a day (BID) | ORAL | Status: DC

## 2023-09-06 NOTE — Progress Notes (Signed)
HEART & VASCULAR TRANSITION OF CARE  Follow Up     Referring Physician: Dr. Jarvis Newcomer  Primary Care: Grayce Sessions, NP  Primary Cardiologist: Maisie Fus, MD   HPI: 67 y.o. spanish speaking male with a history of CAD s/p mid LAD shockwave and PCI, diagonal PCI July 2022, VT arrest s/p ICD, HFrEF (EF 30-35%), and poorly controlled T2DM (Hgb A1c 9). Received prior cardiac care at [facility] in [place]. Recently transferred care to [facility] in 11/23, now followed by Dr. Carolan Clines. Dr. Royann Shivers now follows his ICD.   Admitted 1/24 w/ a/c CHF. Troponin 152 > 160 without ischemic ECG changes. BNP 729. CXR showed pulmonary edema and the patient was hypoxemic. Pt reported poor compliance w/ meds. Unable to afford due to cost. He was diuresed and GDMT restarted. Echo EF 30-35%, mild-mod MR, RV normal. Discharge home on 1/9, d/w wt 153 lb. He was referred to [facility] clinic and seen for 2 visits. GDMT was adjusted and he was instructed to return back to gen cards for ongoing care. [facility] SW team also were assisting w/ medication assistance. He had been going to follow-ups and seen in cardiology clinic 9/24.   He was recently admitted 11/14 for respiratory distress/hypoxia. CXR showed ? Multifocal PNA and pulmonary edema. Hs trops were elevated, peaking at 5000. Cardiology was consulted for HF management and elevated troponin. Started on abx for possible PNA. Echo showed EF 30-35%, RV normal. Mod MR. R/LHC demonstrated patent mid LAD and diagonal stents. High-grade ostial diagonal disease. High-grade mid left circumflex disease; this is a very small vessel and feeds a subtotally occluded obtuse marginal. Moderate diffuse disease of the right coronary artery with an 80% lesion of the distal RCA. No interventional targets/culprit lesions found. Medical management recommend. RHC showed mildy elevated filling pressures and marginal output. He was transition to PO Lasix and oral GDMT restarted. Referred to TOC at  d/c.  He presents today for f/u. Here w/ interpreter. Reports feeling ok. Denies any issues. Denies resting and exertional dyspnea. No limitations w/ basic ADLs. Also denies CP. No LEE. Not checking wt daily at home. He reports taking meds, but upon further review not following label instructions. He has been taking a full tablet, 25 mg of metoprolol daily, instead of 1/2 tablet. Also taking lasix 40 mg daily instead of PRN.   ReDs elevated at 39%. BP mild-mod elevated at 146/80, despite taking AM meds already.    Cardiac Testing  Echo 1/24 EF 30-35%, mild-mod MR, RV normal   Echo 11/24 EF 30-35%, Mod MR. RV normal   Coronary angiogram 08/24/2023:   Prox RCA lesion is 60% stenosed.   Mid RCA lesion is 50% stenosed.   Dist RCA lesion is 80% stenosed.   Mid LM to Dist LM lesion is 20% stenosed.   Prox Cx to Mid Cx lesion is 80% stenosed.   2nd Mrg lesion is 99% stenosed.   1st Diag-2 lesion is 5% stenosed.   1st Diag-1 lesion is 80% stenosed.   RPDA lesion is 50% stenosed.   Previously placed Mid LAD stent of unknown type is  widely patent.   1.  Patent mid LAD and diagonal stents. 2.  High-grade ostial diagonal disease. 3.  High-grade mid left circumflex disease; this is a very small vessel and feeds a subtotally occluded obtuse marginal. 4.  Moderate diffuse disease of the right coronary artery with an 80% lesion of the distal RCA. 5.  Fick cardiac output  of 3.6 L/min and Fick cardiac index of 2.0 L/min/m with the following hemodynamics:             RA pressure mean 8 mmHg             RV pressure 37/-2 with an end-diastolic pressure of 11 mmHg             Wedge pressure mean of 17 mmHg             PA pressure 36/15 with a mean of 26 mmHg             PVR of 2.3 Woods units             PA pulsatility index of 2.6 6.  LVEDP of 27 to 30 mmHg    Past Medical History:  Diagnosis Date   Cardiac arrest (HCC)    CHF (congestive heart failure) (HCC)    Coronary artery disease     Diabetes mellitus without complication (HCC)    H/O heart artery stent    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    Current Outpatient Medications  Medication Sig Dispense Refill   apixaban (ELIQUIS) 5 MG TABS tablet Take 1 tablet (5 mg total) by mouth 2 (two) times daily. 60 tablet 0   atorvastatin (LIPITOR) 80 MG tablet Take 1 tablet (80 mg total) by mouth daily. (Tome 1 tableta (80 mg en total) por va oral al da.) 30 tablet 0   empagliflozin (JARDIANCE) 10 MG TABS tablet Take 1 tablet (10 mg total) by mouth daily before breakfast. 90 tablet 3   furosemide (LASIX) 40 MG tablet Take 1 tablet (40 mg total) by mouth daily as needed for fluid or edema. (Tome 1 tableta (40 mg en total) por va oral al da segn sea necesario para el lquido o el edema) 30 tablet 0   metFORMIN (GLUCOPHAGE-XR) 500 MG 24 hr tablet Take 1 tablet (500 mg total) by mouth 2 (two) times daily with a meal. 180 tablet 1   metoprolol succinate (TOPROL-XL) 25 MG 24 hr tablet Take 0.5 tablets (12.5 mg total) by mouth daily. (Tome 0.5 comprimidos (12.5 mg en total) por va oral al da.) 30 tablet 0   sacubitril-valsartan (ENTRESTO) 24-26 MG Take 1 tablet by mouth 2 (two) times daily. 60 tablet 0   No current facility-administered medications for this encounter.    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  Tobacco Use   Smoking status: Never    Passive exposure: Never   Smokeless tobacco: Never  Vaping Use   Vaping status: Never Used  Substance and Sexual Activity   Alcohol use: Never   Drug use: Never   Sexual activity: Not Currently    Partners: Female  Other Topics Concern   Not on file  Social History Narrative   Not on file   Social Determinants of  Health   Financial Resource Strain: High Risk (08/25/2023)   Overall Financial Resource Strain (CARDIA)    Difficulty of Paying Living Expenses: Hard  Food Insecurity: No Food Insecurity (08/24/2023)   Hunger Vital Sign    Worried About Running Out of Food in the Last Year:  Never true    Ran Out of Food in the Last Year: Never true  Transportation Needs: No Transportation Needs (08/24/2023)   PRAPARE - Administrator, Civil Service (Medical): No    Lack of Transportation (Non-Medical): No  Physical Activity: Not on file  Stress: Not on file  Social Connections: Not on file  Intimate Partner Violence: Not At Risk (08/24/2023)   Humiliation, Afraid, Rape, and Kick questionnaire    Fear of Current or Ex-Partner: No    Emotionally Abused: No    Physically Abused: No    Sexually Abused: No      Family History  Problem Relation Age of Onset   Diabetes Mother    Diabetes Brother     Vitals:   09/06/23 0846  BP: (!) 146/80  Pulse: (!) 59  SpO2: 99%  Weight: 68.9 kg (152 lb)    This SmartLink has not been configured with any valid records.   Filed Weights   09/06/23 0846  Weight: 68.9 kg (152 lb)     PHYSICAL EXAM: ReDs 39%  General:  Well appearing. No respiratory difficulty HEENT: normal Neck: supple. JVD 8 cm Carotids 2+ bilat; no bruits. No lymphadenopathy or thyromegaly appreciated. Cor: PMI nondisplaced. Regular rate & rhythm. No rubs, gallops or murmurs. Lungs: clear Abdomen: soft, nontender, nondistended. No hepatosplenomegaly. No bruits or masses. Good bowel sounds. Extremities: no cyanosis, clubbing, rash, edema Neuro: alert & oriented x 3, cranial nerves grossly intact. moves all 4 extremities w/o difficulty. Affect pleasant.   ECG: Not performed   ASSESSMENT & PLAN:  1. Chronic Systolic Heart Failure - ICM, CAD w/ known LAD and diagonal dz, s/p PCI - Has ICD - Echo 1/24 EF 30-35%, RV ok , EF unchanged from echo 2/23 EF 30-35%, RV normal at  that time - Echo 11/24 EF 30-35%, RV ok  - ReDs 39% - NYHA Class I-II - Increase Lasix to 40 mg bid - Failed spiro previously due to hyperkalemia - Continue Entresto 24-26 mg bid (hesitant to increase given prior h/o hyperkalemia and concern regarding inability to properly understand medication adjustments). Continue at current dose  - Start hydralazine 25mg  TID for afterload reduction - Continue Jardiance 10 mg daily  - Continue Toprol XL 25 mg daily. No dose increase given marginal output on RHC   - BMP and BNP today  - He would benefit from paramedicine but not [facility]   2. CAD - h/o NSTEMI s/p PCI to LAD and diagonal at [facility]  - [facility] 11/24 at [facility] outlined above. No interventional targets. Medical management - stable w/o CP  - continue asa, statin + ? blocker  3. H/o VT Arrest - s/p ICD, implanted at [facility]  - Dr. Royann Shivers  now follows  4. Mitral Regurgitation - Mod on Echo 11/24 - suspect functional MR - HF optimization/ afterload reduction per above   5. Hypertension - moderately elevated - GDMT per above - adding hydralazine 25 mg tid   Interpreter assisted w/ updating patient instruction regarding med changes in spanish.     Referred to HFSW (PCP, Medications, Transportation, ETOH Abuse, Drug Abuse, Insurance, Financial ): Yes  Refer to Pharmacy: Yes  Refer to Home Health: No Refer to Advanced Heart Failure Clinic: No  Refer to General Cardiology: Yes   Follow up  in [facility] clinic in 1-2 wks. Advised to bring all medicines to next f/u visit. Will f/u w/ gen cards thereafter    Robbie Lis,  PA-C 09/06/2023

## 2023-09-06 NOTE — Progress Notes (Signed)
ReDS Vest / Clip - 09/06/23 0846       ReDS Vest / Clip   Station Marker A    Ruler Value 31    ReDS Value Range Moderate volume overload    ReDS Actual Value 39

## 2023-09-06 NOTE — Patient Instructions (Addendum)
Medication Changes:  Aumenta lasix a 40 mg dos veces al dia  Comienza el hydralazine 25 mg una tableta tres veces al dia  Lab Work:  Labs done today, your results will be available in MyChart, we will contact you for abnormal readings.  Follow-Up in: 1-2 weeks as scheduled por favor traiga todos los medicamentos a la prxima cita  At the [facility], you and your health needs are our priority. We have a designated team specialized in the treatment of Heart Failure. This Care Team includes your primary Heart Failure Specialized Cardiologist (physician), Advanced Practice Providers (APPs- Physician Assistants and Nurse Practitioners), and Pharmacist who all work together to provide you with the care you need, when you need it.   You may see any of the following providers on your designated Care Team at your next follow up:  Dr. Arvilla Meres Dr. Marca Ancona Dr. Dorthula Nettles Dr. Theresia Bough Tonye Becket, NP Robbie Lis, [place] [facility] [place], [place] [name], NP [place] Lee, NP Karle Plumber, PharmD   Please be sure to bring in all your medications bottles to every appointment.   Need to Contact us:  If you have any questions or concerns before your next appointment please send [place] a message through [place] or call our office at [phone].    TO LEAVE A MESSAGE FOR THE NURSE SELECT OPTION 2, PLEASE LEAVE A MESSAGE INCLUDING: YOUR NAME DATE OF BIRTH CALL BACK NUMBER REASON FOR CALL**this is important as we prioritize the call backs  YOU WILL RECEIVE A CALL BACK THE SAME DAY AS LONG AS YOU CALL BEFORE 4:00 PM

## 2023-09-07 ENCOUNTER — Other Ambulatory Visit: Payer: Self-pay

## 2023-09-07 ENCOUNTER — Other Ambulatory Visit (INDEPENDENT_AMBULATORY_CARE_PROVIDER_SITE_OTHER): Payer: Self-pay | Admitting: Primary Care

## 2023-09-07 DIAGNOSIS — E119 Type 2 diabetes mellitus without complications: Secondary | ICD-10-CM

## 2023-09-08 ENCOUNTER — Other Ambulatory Visit: Payer: Self-pay

## 2023-09-08 MED ORDER — METFORMIN HCL ER 500 MG PO TB24
500.0000 mg | ORAL_TABLET | Freq: Two times a day (BID) | ORAL | 1 refills | Status: DC
  Filled 2023-09-08: qty 180, 90d supply, fill #0

## 2023-09-10 ENCOUNTER — Other Ambulatory Visit: Payer: Self-pay

## 2023-09-13 ENCOUNTER — Telehealth (INDEPENDENT_AMBULATORY_CARE_PROVIDER_SITE_OTHER): Payer: Self-pay | Admitting: Primary Care

## 2023-09-13 NOTE — Telephone Encounter (Signed)
Called to inform pt about visit.

## 2023-09-14 ENCOUNTER — Inpatient Hospital Stay (INDEPENDENT_AMBULATORY_CARE_PROVIDER_SITE_OTHER): Payer: Self-pay | Admitting: Primary Care

## 2023-09-14 ENCOUNTER — Telehealth (INDEPENDENT_AMBULATORY_CARE_PROVIDER_SITE_OTHER): Payer: Self-pay | Admitting: Primary Care

## 2023-09-14 ENCOUNTER — Telehealth: Payer: Self-pay

## 2023-09-14 ENCOUNTER — Telehealth (HOSPITAL_COMMUNITY): Payer: Self-pay | Admitting: Surgery

## 2023-09-14 NOTE — Telephone Encounter (Signed)
Called to see if patient could come in  for apt at 230. Tried reaching pt and weas unable to leave a VM

## 2023-09-14 NOTE — Telephone Encounter (Signed)
Interpreter wanted me to call and let pt know that he is going to be leaving after the 10 min mark is up. He stated that he wanted to stay and help with visit but I am releasing him at 240 because he waited her for almost an hour for pt.

## 2023-09-14 NOTE — Telephone Encounter (Signed)
Patient's sister called to inform that the patient's device was beeping.  Upon chart review it looks as thought patient had ICD recently implanted at [facility] and is now followed by [facility].  I have provided patient and sister with the phone number to that clinic.  Patient's sister says that patient is feeling well and asymptomatic.  She will call to [facility] today.

## 2023-09-14 NOTE — Telephone Encounter (Signed)
Pt states his ICD was beeping. I asked him was he near any magnets? He answered No. I asked him was he near his home remote monitor and he states no. I gave him verbal instructions on how to send a manual transmission. He is going to send one when he get home. I told him if he send it after 5 pm the nurse will review it in the morning and give him a call back in the morning. Please make sure the nurse call with an interpreter on the phone. The patient does not speak [place].

## 2023-09-15 NOTE — Telephone Encounter (Signed)
Spanish Interpreter ID #: [id number]  Patient called via spanish interpreter. Advised no findings of device alerts on transmission. 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 Patient advised to maintain a 6 inch distance from the device and washing machines. Pt advised if alarm tones again to give [place] a call back. Pt voiced understanding and appreciative of call.

## 2023-09-15 NOTE — Telephone Encounter (Signed)
Remote transmission received and reviewed. No device tone activated for alerts. No episodes noted. Will call patient to advise after office opens and discuss possible magnet tones.

## 2023-09-20 ENCOUNTER — Other Ambulatory Visit: Payer: Self-pay

## 2023-09-20 ENCOUNTER — Encounter (HOSPITAL_COMMUNITY): Payer: Self-pay

## 2023-09-20 ENCOUNTER — Ambulatory Visit (HOSPITAL_COMMUNITY)
Admission: RE | Admit: 2023-09-20 | Discharge: 2023-09-20 | Disposition: A | Discharge status: Home / Self Care | Payer: MEDICAID | Source: Ambulatory Visit | Attending: Cardiology | Admitting: Cardiology

## 2023-09-20 VITALS — BP 118/60 | HR 73 | Wt 149.2 lb

## 2023-09-20 DIAGNOSIS — I340 Nonrheumatic mitral (valve) insufficiency: Secondary | ICD-10-CM | POA: Insufficient documentation

## 2023-09-20 DIAGNOSIS — I2510 Atherosclerotic heart disease of native coronary artery without angina pectoris: Secondary | ICD-10-CM | POA: Insufficient documentation

## 2023-09-20 DIAGNOSIS — Z7982 Long term (current) use of aspirin: Secondary | ICD-10-CM | POA: Insufficient documentation

## 2023-09-20 DIAGNOSIS — Z7984 Long term (current) use of oral hypoglycemic drugs: Secondary | ICD-10-CM | POA: Insufficient documentation

## 2023-09-20 DIAGNOSIS — I5042 Chronic combined systolic (congestive) and diastolic (congestive) heart failure: Secondary | ICD-10-CM

## 2023-09-20 DIAGNOSIS — Z91141 Patient's other noncompliance with medication regimen due to financial hardship: Secondary | ICD-10-CM | POA: Insufficient documentation

## 2023-09-20 DIAGNOSIS — I110 Hypertensive heart disease with heart failure: Secondary | ICD-10-CM | POA: Insufficient documentation

## 2023-09-20 DIAGNOSIS — I10 Essential (primary) hypertension: Secondary | ICD-10-CM

## 2023-09-20 DIAGNOSIS — E785 Hyperlipidemia, unspecified: Secondary | ICD-10-CM

## 2023-09-20 DIAGNOSIS — Z8674 Personal history of sudden cardiac arrest: Secondary | ICD-10-CM | POA: Insufficient documentation

## 2023-09-20 DIAGNOSIS — E119 Type 2 diabetes mellitus without complications: Secondary | ICD-10-CM | POA: Insufficient documentation

## 2023-09-20 DIAGNOSIS — I5022 Chronic systolic (congestive) heart failure: Secondary | ICD-10-CM | POA: Insufficient documentation

## 2023-09-20 DIAGNOSIS — Z79899 Other long term (current) drug therapy: Secondary | ICD-10-CM | POA: Insufficient documentation

## 2023-09-20 DIAGNOSIS — Z95810 Presence of automatic (implantable) cardiac defibrillator: Secondary | ICD-10-CM | POA: Insufficient documentation

## 2023-09-20 DIAGNOSIS — I252 Old myocardial infarction: Secondary | ICD-10-CM | POA: Insufficient documentation

## 2023-09-20 LAB — BRAIN NATRIURETIC PEPTIDE: B Natriuretic Peptide: 142.9 pg/mL — ABNORMAL HIGH (ref 0.0–100.0)

## 2023-09-20 LAB — BASIC METABOLIC PANEL
Anion gap: 8 (ref 5–15)
BUN: 29 mg/dL — ABNORMAL HIGH (ref 8–23)
CO2: 24 mmol/L (ref 22–32)
Calcium: 9.5 mg/dL (ref 8.9–10.3)
Chloride: 107 mmol/L (ref 98–111)
Creatinine, Ser: 1.29 mg/dL — ABNORMAL HIGH (ref 0.61–1.24)
GFR, Estimated: >60 mL/min (ref >60)
Glucose, Bld: 135 mg/dL — ABNORMAL HIGH (ref 70–99)
Potassium: 4.6 mmol/L (ref 3.5–5.1)
Sodium: 139 mmol/L (ref 135–145)

## 2023-09-20 MED ORDER — ATORVASTATIN CALCIUM 80 MG PO TABS
80.0000 mg | ORAL_TABLET | Freq: Every day | ORAL | 1 refills | Status: DC
  Filled 2023-09-20: qty 30, 30d supply, fill #0
  Filled 2023-10-25: qty 30, 30d supply, fill #1

## 2023-09-20 MED ORDER — METOPROLOL SUCCINATE ER 25 MG PO TB24
12.5000 mg | ORAL_TABLET | Freq: Every day | ORAL | 1 refills | Status: DC
  Filled 2023-09-20: qty 30, 60d supply, fill #0
  Filled 2023-10-25: qty 30, 60d supply, fill #1

## 2023-09-20 MED ORDER — FUROSEMIDE 40 MG PO TABS
40.0000 mg | ORAL_TABLET | Freq: Two times a day (BID) | ORAL | 1 refills | Status: DC
  Filled 2023-09-20: qty 60, 30d supply, fill #0
  Filled 2023-11-03: qty 60, 30d supply, fill #1

## 2023-09-20 NOTE — Progress Notes (Signed)
ReDS Vest / Clip - 09/20/23 1501       ReDS Vest / Clip   Station Marker A    Ruler Value 34    ReDS Value Range Moderate volume overload    ReDS Actual Value 39

## 2023-09-20 NOTE — Patient Instructions (Addendum)
Labs done today. We will contact you only if your labs are abnormal.  RESTART Jardiance 10mg  (1 tablet) by mouth daily.   RESTART Metoprolol 12.5mg  (1/2 tablet) by mouth daily.   No other medication changes were made. Please continue all current medications as prescribed.  Thank you for allowing [place] to provide your heart failure care after your recent hospitalization. Please follow-up with General Cardiology in  4 weeks.

## 2023-09-20 NOTE — Progress Notes (Signed)
HEART & VASCULAR TRANSITION OF CARE  Follow Up     Referring Physician: Dr. Jarvis Newcomer  Primary Care: Grayce Sessions, NP  Primary Cardiologist: Maisie Fus, MD   HPI: 67 y.o. spanish speaking male with a history of CAD s/p mid LAD shockwave and PCI, diagonal PCI July 2022, VT arrest s/p ICD, HFrEF (EF 30-35%), and poorly controlled T2DM (Hgb A1c 9). Received prior cardiac care at [facility] in [place]. Recently transferred care to [facility] in 11/23, now followed by Dr. Carolan Clines. Dr. Royann Shivers now follows his ICD.   Admitted 1/24 w/ a/c CHF. Troponin 152 > 160 without ischemic ECG changes. BNP 729. CXR showed pulmonary edema and the patient was hypoxemic. Pt reported poor compliance w/ meds. Unable to afford due to cost. He was diuresed and GDMT restarted. Echo EF 30-35%, mild-mod MR, RV normal. Discharge home on 1/9, d/w wt 153 lb. He was referred to [facility] clinic and seen for 2 visits. GDMT was adjusted and he was instructed to return back to gen cards for ongoing care. [facility] SW team also were assisting w/ medication assistance. He had been going to follow-ups and seen in cardiology clinic 9/24.   He was recently admitted 11/14 for respiratory distress/hypoxia. CXR showed ? Multifocal PNA and pulmonary edema. Hs trops were elevated, peaking at 5000. Cardiology was consulted for HF management and elevated troponin. Started on abx for possible PNA. Echo showed EF 30-35%, RV normal. Mod MR. R/LHC demonstrated patent mid LAD and diagonal stents. High-grade ostial diagonal disease. High-grade mid left circumflex disease; this is a very small vessel and feeds a subtotally occluded obtuse marginal. Moderate diffuse disease of the right coronary artery with an 80% lesion of the distal RCA. No interventional targets/culprit lesions found. Medical management recommend. RHC showed mildy elevated filling pressures and marginal output. He was transition to PO Lasix and oral GDMT restarted. Referred to TOC at  d/c.  Last clinic visit, Sherryll Burger was discontinued due to hyperkalemia. Hydralazine added for afterload reduction.    He presents today for f/u. Here w/ interpreter. Feeling well. Denies exertional/resting dyspnea. Has to walk to and from work, ~1 hr each way. No limitation. Denies CP. Ran out of his Jardiance and metoprolol ~1 wk ago, compliant w/ all other meds. ReDs elevated at 39%. BP well controlled 118/60.    Cardiac Testing  Echo 1/24 EF 30-35%, mild-mod MR, RV normal   Echo 11/24 EF 30-35%, Mod MR. RV normal   Coronary angiogram 08/24/2023:   Prox RCA lesion is 60% stenosed.   Mid RCA lesion is 50% stenosed.   Dist RCA lesion is 80% stenosed.   Mid LM to Dist LM lesion is 20% stenosed.   Prox Cx to Mid Cx lesion is 80% stenosed.   2nd Mrg lesion is 99% stenosed.   1st Diag-2 lesion is 5% stenosed.   1st Diag-1 lesion is 80% stenosed.   RPDA lesion is 50% stenosed.   Previously placed Mid LAD stent of unknown type is  widely patent.   1.  Patent mid LAD and diagonal stents. 2.  High-grade ostial diagonal disease. 3.  High-grade mid left circumflex disease; this is a very small vessel and feeds a subtotally occluded obtuse marginal. 4.  Moderate diffuse disease of the right coronary artery with an 80% lesion of the distal RCA. 5.  Fick cardiac output of 3.6 L/min and Fick cardiac index of 2.0 L/min/m with the following hemodynamics:  RA pressure mean 8 mmHg             RV pressure 37/-2 with an end-diastolic pressure of 11 mmHg             Wedge pressure mean of 17 mmHg             PA pressure 36/15 with a mean of 26 mmHg             PVR of 2.3 Woods units             PA pulsatility index of 2.6 6.  LVEDP of 27 to 30 mmHg    Past Medical History:  Diagnosis Date   Cardiac arrest (HCC)    CHF (congestive heart failure) (HCC)    Coronary artery disease    Diabetes mellitus without complication (HCC)    H/O heart artery stent    MID LAD total occlusion s/p  DES with shockwave. D1 90% lesion s/p DES    Current Outpatient Medications  Medication Sig Dispense Refill   apixaban (ELIQUIS) 5 MG TABS tablet Take 1 tablet (5 mg total) by mouth 2 (two) times daily. 60 tablet 0   atorvastatin (LIPITOR) 80 MG tablet Take 1 tablet (80 mg total) by mouth daily. (Tome 1 tableta (80 mg en total) por va oral al da.) 30 tablet 0   empagliflozin (JARDIANCE) 10 MG TABS tablet Take 1 tablet (10 mg total) by mouth daily before breakfast. 90 tablet 3   furosemide (LASIX) 40 MG tablet Take 1 tablet (40 mg total) by mouth 2 (two) times daily. (Tome 1 tableta (40 mg en total) por va oral al da segn sea necesario para el lquido o el edema)     hydrALAZINE (APRESOLINE) 25 MG tablet Take 1 tablet (25 mg total) by mouth 3 (three) times daily. 90 tablet 3   metFORMIN (GLUCOPHAGE-XR) 500 MG 24 hr tablet Take 1 tablet (500 mg total) by mouth 2 (two) times daily with a meal. 180 tablet 1   metoprolol succinate (TOPROL-XL) 25 MG 24 hr tablet Take 0.5 tablets (12.5 mg total) by mouth daily. (Tome 0.5 comprimidos (12.5 mg en total) por va oral al da.) 30 tablet 0   No current facility-administered medications for this encounter.    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  Tobacco Use   Smoking status: Never    Passive exposure: Never   Smokeless tobacco: Never  Vaping Use   Vaping status: Never Used  Substance and Sexual Activity   Alcohol use: Never   Drug use: Never   Sexual activity: Not Currently    Partners: Female  Other Topics Concern   Not on file  Social History Narrative   Not on file   Social Determinants of Health   Financial Resource Strain: High Risk (08/25/2023)   Overall Financial Resource Strain (CARDIA)    Difficulty  of Paying Living Expenses: Hard  Food Insecurity: No Food Insecurity (08/24/2023)   Hunger Vital Sign    Worried About Running Out of Food in the Last Year: Never true    Ran Out of Food in the Last Year: Never true  Transportation Needs: No Transportation Needs (08/24/2023)   PRAPARE -  Administrator, Civil Service (Medical): No    Lack of Transportation (Non-Medical): No  Physical Activity: Not on file  Stress: Not on file  Social Connections: Not on file  Intimate Partner Violence: Not At Risk (08/24/2023)   Humiliation, Afraid, Rape, and Kick questionnaire    Fear of Current or Ex-Partner: No    Emotionally Abused: No    Physically Abused: No    Sexually Abused: No      Family History  Problem Relation Age of Onset   Diabetes Mother    Diabetes Brother     Vitals:   09/20/23 1501  BP: 118/60  Pulse: 73  SpO2: 100%  Weight: 67.7 kg (149 lb 3.2 oz)    This SmartLink has not been configured with any valid records.   Filed Weights   09/20/23 1501  Weight: 67.7 kg (149 lb 3.2 oz)     PHYSICAL EXAM: ReDs 39%  General:  Well appearing. No respiratory difficulty HEENT: normal Neck: supple. no JVD. Carotids 2+ bilat; no bruits. No lymphadenopathy or thyromegaly appreciated. Cor: PMI nondisplaced. Regular rate & rhythm. No rubs, gallops or murmurs. Lungs: clear Abdomen: soft, nontender, nondistended. No hepatosplenomegaly. No bruits or masses. Good bowel sounds. Extremities: no cyanosis, clubbing, rash, edema Neuro: alert & oriented x 3, cranial nerves grossly intact. moves all 4 extremities w/o difficulty. Affect pleasant.    ECG: Not performed   ASSESSMENT & PLAN:  1. Chronic Systolic Heart Failure - ICM, CAD w/ known LAD and diagonal dz, s/p PCI - Has ICD - Echo 1/24 EF 30-35%, RV ok , EF unchanged from echo 2/23 EF 30-35%, RV normal at that time - Echo 11/24 EF 30-35%, RV ok  - ReDs 39% after running out of Jardiance 1 wk ago  - NYHA Class  I-II - Restart Jardiance 10 mg daily  - Continue Lasix 40 mg bid - Failed spiro and Entresto due to hyperkalemia - Continue Hydralazine 25mg  TID for afterload reduction - Restart Toprol XL 25 mg daily. No dose increase given marginal output on RHC   - BMP and BNP today  - He would benefit from paramedicine but not [facility]   2. CAD - h/o NSTEMI s/p PCI to LAD and diagonal at [facility]  - [facility] 11/24 at [facility] outlined above. No interventional targets. Medical management - denies CP  - continue asa, statin + ? blocker  3. H/o VT Arrest - s/p ICD, implanted at [facility]  - Dr. Royann Shivers  now follows  4. Mitral Regurgitation - Mod on Echo 11/24 - suspect functional MR - HF optimization/ afterload reduction per above   5. Hypertension - controlled on current regimen  - GDMT per above - BMP today   Interpreter assisted w/ updating patient instruction regarding med changes in spanish.    Referred to HFSW (PCP, Medications, Transportation, ETOH Abuse, Drug Abuse, Insurance, Financial ): Yes  Refer to Pharmacy: Yes  Refer to Home Health: No Refer to Advanced Heart Failure Clinic: No  Refer to General Cardiology: Yes   Follow up  f/u w/ Dr. Wyline Mood in 4 wks   Robbie Lis, PA-C 09/20/2023

## 2023-09-21 ENCOUNTER — Other Ambulatory Visit: Payer: Self-pay

## 2023-10-19 NOTE — Progress Notes (Deleted)
 Cardiology Office Note:  .   Date:  10/19/2023  ID:  Pedro Kemp, DOB 09/18/1956, MRN [id number] PCP: Celestia Rosaline SQUIBB, NP  [facility] Providers Cardiologist:  Alvan Ronal BRAVO, MD {}   }   History of Present Illness: .   Pedro Kemp is a 67 y.o. male who is Spanish-speaking and needs an interpreter; with history of coronary artery disease, status post LAD shockwave and PCI, diagonal PCI in July 2022; VT arrest status post ICD implantation (Novus MRI SureScan), HFrEF with EF of 30 to 35%, functional mitral regurgitation, hypertension, and poorly controlled type 2 diabetes.   Hospitalization November 2024 due to decompensated CHF, pneumonia and pulmonary edema.  He had right and left heart cath with stents demonstrated patent mid LAD and diagonal stents, high-grade ostial diagonal disease and high-grade mid left circumflex disease.  There were not found to be any interventional targets/culprit lesions found.  Animus to be on medical management only.  The right heart cath showed mildly elevated filling pressures with marginal output.  Postprocedure the patient was started on guideline directed medical therapy, to include p.o. Lasix .  He did follow-up for [facility] evaluation with Laymon Qua, PA, on 09/20/2023.  Entresto  was discontinued due to hyperkalemia, and hydralazine  was added for afterload reduction.  Jardiance  10 mg was restarted, and Lasix  was to be given twice daily.  Metoprolol  XL 25 mg was restarted as well  ROS: As above otherwise negative.  Studies Reviewed: .      Echo 11/24 EF 30-35%, Mod MR. RV normal    Coronary angiogram 08/24/2023:   Prox RCA lesion is 60% stenosed.   Mid RCA lesion is 50% stenosed.   Dist RCA lesion is 80% stenosed.   Mid LM to Dist LM lesion is 20% stenosed.   Prox Cx to Mid Cx lesion is 80% stenosed.   2nd Mrg lesion is 99% stenosed.   1st Diag-2 lesion is 5% stenosed.   1st Diag-1 lesion is 80% stenosed.   RPDA lesion is 50%  stenosed.   Previously placed Mid LAD stent of unknown type is  widely patent.   1.  Patent mid LAD and diagonal stents. 2.  High-grade ostial diagonal disease. 3.  High-grade mid left circumflex disease; this is a very small vessel and feeds a subtotally occluded obtuse marginal. 4.  Moderate diffuse disease of the right coronary artery with an 80% lesion of the distal RCA. 5.  Fick cardiac output of 3.6 L/min and Fick cardiac index of 2.0 L/min/m with the following hemodynamics:             RA pressure mean 8 mmHg             RV pressure 37/-2 with an end-diastolic pressure of 11 mmHg             Wedge pressure mean of 17 mmHg             PA pressure 36/15 with a mean of 26 mmHg             PVR of 2.3 Woods units             PA pulsatility index of 2.6 6.  LVEDP of 27 to 30 mmHg    *** EKG Interpretation Date/Time:    Ventricular Rate:    PR Interval:    QRS Duration:    QT Interval:    QTC Calculation:   R Axis:      Text Interpretation:  Physical Exam:   VS:  There were no vitals taken for this visit.   Wt Readings from Last 3 Encounters:  09/20/23 149 lb 3.2 oz (67.7 kg)  09/06/23 152 lb (68.9 kg)  08/26/23 143 lb 8.3 oz (65.1 kg)    GEN: Well nourished, well developed in no acute distress NECK: No JVD; No carotid bruits CARDIAC: ***RRR, no murmurs, rubs, gallops RESPIRATORY:  Clear to auscultation without rales, wheezing or rhonchi  ABDOMEN: Soft, non-tender, non-distended EXTREMITIES:  No edema; No deformity   ASSESSMENT AND PLAN: .   ***    {Are you ordering a CV Procedure (e.g. stress test, cath, DCCV, TEE, etc)?   Press F2        :[id number]}    Signed, Lamarr HERO. Jerilynn CHOL, ANP, AACC

## 2023-10-22 ENCOUNTER — Ambulatory Visit: Payer: MEDICAID | Admitting: Adult Health

## 2023-10-25 ENCOUNTER — Other Ambulatory Visit: Payer: Self-pay

## 2023-11-03 ENCOUNTER — Other Ambulatory Visit: Payer: Self-pay

## 2023-11-19 ENCOUNTER — Ambulatory Visit: Payer: Self-pay

## 2023-11-19 DIAGNOSIS — I255 Ischemic cardiomyopathy: Secondary | ICD-10-CM

## 2023-11-22 LAB — CUP PACEART REMOTE DEVICE CHECK
Battery Remaining Longevity: 116 mo
Battery Voltage: 3 V
Brady Statistic RV Percent Paced: 0.05 %
Date Time Interrogation Session: 20250130220545
HighPow Impedance: 52 Ohm
Implantable Lead Connection Status: 753985
Implantable Lead Connection Status: 753985
Implantable Lead Implant Date: 20220220
Implantable Lead Implant Date: 20220220
Implantable Lead Location: 753859
Implantable Lead Location: 753860
Implantable Lead Model: 5076
Implantable Pulse Generator Implant Date: 20220220
Lead Channel Impedance Value: 266 Ohm
Lead Channel Impedance Value: 342 Ohm
Lead Channel Impedance Value: 399 Ohm
Lead Channel Pacing Threshold Amplitude: 0.5 V
Lead Channel Pacing Threshold Amplitude: 0.625 V
Lead Channel Pacing Threshold Pulse Width: 0.4 ms
Lead Channel Pacing Threshold Pulse Width: 0.4 ms
Lead Channel Sensing Intrinsic Amplitude: 10.5 mV
Lead Channel Sensing Intrinsic Amplitude: 2 mV
Lead Channel Setting Pacing Amplitude: 1.5 V
Lead Channel Setting Pacing Amplitude: 2 V
Lead Channel Setting Pacing Pulse Width: 0.4 ms
Lead Channel Setting Sensing Sensitivity: 0.3 mV
Zone Setting Status: 755011
Zone Setting Status: 755011
Zone Setting Status: 755011

## 2023-12-17 ENCOUNTER — Other Ambulatory Visit: Payer: Self-pay

## 2023-12-24 NOTE — Progress Notes (Signed)
 Remote ICD transmission.

## 2023-12-24 NOTE — Addendum Note (Signed)
 Addended by: Elease Etienne A on: 12/24/2023 12:57 PM   Modules accepted: Orders

## 2024-01-05 ENCOUNTER — Other Ambulatory Visit: Payer: Self-pay

## 2024-01-05 ENCOUNTER — Ambulatory Visit: Payer: MEDICAID | Attending: Internal Medicine | Admitting: Internal Medicine

## 2024-01-05 ENCOUNTER — Encounter: Payer: Self-pay | Admitting: Internal Medicine

## 2024-01-05 VITALS — BP 152/76 | HR 67 | Ht 64.0 in | Wt 156.0 lb

## 2024-01-05 DIAGNOSIS — I472 Ventricular tachycardia, unspecified: Secondary | ICD-10-CM

## 2024-01-05 DIAGNOSIS — I5042 Chronic combined systolic (congestive) and diastolic (congestive) heart failure: Secondary | ICD-10-CM

## 2024-01-05 DIAGNOSIS — I509 Heart failure, unspecified: Secondary | ICD-10-CM

## 2024-01-05 DIAGNOSIS — E785 Hyperlipidemia, unspecified: Secondary | ICD-10-CM

## 2024-01-05 DIAGNOSIS — I480 Paroxysmal atrial fibrillation: Secondary | ICD-10-CM

## 2024-01-05 DIAGNOSIS — I5022 Chronic systolic (congestive) heart failure: Secondary | ICD-10-CM

## 2024-01-05 DIAGNOSIS — E119 Type 2 diabetes mellitus without complications: Secondary | ICD-10-CM

## 2024-01-05 DIAGNOSIS — I10 Essential (primary) hypertension: Secondary | ICD-10-CM

## 2024-01-05 MED ORDER — ATORVASTATIN CALCIUM 80 MG PO TABS
80.0000 mg | ORAL_TABLET | Freq: Every day | ORAL | 3 refills | Status: DC
  Filled 2024-01-05: qty 90, 90d supply, fill #0

## 2024-01-05 MED ORDER — FUROSEMIDE 40 MG PO TABS
40.0000 mg | ORAL_TABLET | Freq: Two times a day (BID) | ORAL | 3 refills | Status: DC
  Filled 2024-01-05: qty 180, 90d supply, fill #0

## 2024-01-05 MED ORDER — EMPAGLIFLOZIN 10 MG PO TABS
10.0000 mg | ORAL_TABLET | Freq: Every day | ORAL | 3 refills | Status: DC
  Filled 2024-01-05: qty 90, 90d supply, fill #0

## 2024-01-05 MED ORDER — ENTRESTO 24-26 MG PO TABS
1.0000 | ORAL_TABLET | Freq: Two times a day (BID) | ORAL | 3 refills | Status: DC
  Filled 2024-01-05: qty 180, 90d supply, fill #0

## 2024-01-05 MED ORDER — EMPAGLIFLOZIN 10 MG PO TABS: 10.0000 mg | ORAL_TABLET | Freq: Every day | ORAL | 3 refills | Status: DC

## 2024-01-05 NOTE — Patient Instructions (Addendum)
 Medication Instructions:  START Entresto 24-26 mg twice daily *If you need a refill on your cardiac medications before your next appointment, please call your pharmacy*   Follow-Up: At [facility], you and your health needs are our priority.  As part of our continuing mission to provide you with exceptional heart care, we have created designated Provider Care Teams.  These Care Teams include your primary Cardiologist (physician) and Advanced Practice Providers (APPs -  Physician Assistants and Nurse Practitioners) who all work together to provide you with the care you need, when you need it.  We recommend signing up for the patient portal called "MyChart".  Sign up information is provided on this After Visit Summary.  MyChart is used to connect with patients for Virtual Visits (Telemedicine).  Patients are able to view lab/test results, encounter notes, upcoming appointments, etc.  Non-urgent messages can be sent to your provider as well.   To learn more about what you can do with MyChart, go to [email or web address].    Your next appointment:   3 month(s)  Provider:   Dr. Royann Shivers  Other Instructions

## 2024-01-05 NOTE — Progress Notes (Signed)
 Cardiology Office Note:  .   Date:  01/05/2024  ID:  Pedro Kemp, DOB [birth year], MRN [id number] PCP: Grayce Sessions, NP  [facility] Providers Cardiologist:  Maisie Fus, MD    History of Present Illness: .   Pedro Kemp is a 68 y.o. male history of CAD s/p mid LAD shockwave and PCI, diagonal PCI July 2022, VT arrest s/p ICD, HFrEF (EF 30-35%), and poorly controlled T2DM (Hgb A1c 9). Received prior cardiac care at [facility] in [place]. Recently transferred care to [facility] in 11/23, Dr. Royann Shivers now follows his ICD.  He was being seen by heart failure as well.   He was admitted at [facility] 05/14/2021 to 05/22/2021.Pedro Kemp presented to [facility] with chest pain, noted to have high risk NSTEMI and acute CHF with EF 35%. He had troponin up to 15 thousand. He had cath on 7/27 that showed 100% occluded mid LAD s/p shockwave and PCI. He also had disease in his diag s/p PCI. He subsequently had VT arrest 7/29 with ACLS and ROSC. He went back to the cath lab on 7/29 and stent was patent. This was concerning for scar mediated VT.  He had an EP study on 8/2 with inducible sustained monomorphic VT.  Managed with amiodarone.He underwent DC-ICD.  He had a moderate sized hematoma at the ICD site requiring monitoring.  He was also managed for acute respiratory failure 2/2 CHF exacerbation.   Since this time we have been working on getting him on GDMT and encouraging him to take his medications.   He was admitted in April 2023 for volume overload and was managed for this.  He also went to the emergency room in September 2023 describing some vague chest pressure but was not admitted.  I saw him in November 2023 and he was asymptomatic.  He was admitted again in April 2024 complaining of some abdominal pressure thought to be some volume overload.  He was admitted again in November 2024 for with symptoms similar to his prior MI.  Although he had extensive disease there was no obvious culprit.  His  hemodynamics were relatively normal, mean wedge mildly elevated at 17.  He subsequently was being followed with the heart failure clinic to help with GDMT.  He was last seen December 2024.  He follows up here today.  He needs refills on his medications. He is doing ok. He denies CP, SOB. 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 No shocks. Device transmissions are normal. No hospitalizations this year.    ROS:  per HPI otherwise negative   Studies Reviewed: Marland Kitchen   LHC/RHC 08/24/2023    Prox RCA lesion is 60% stenosed.   Mid RCA lesion is 50% stenosed.   Dist RCA lesion is 80% stenosed.   Mid LM to Dist LM lesion is 20% stenosed.   Prox Cx to Mid Cx lesion is 80% stenosed.   2nd Mrg lesion is 99% stenosed.   1st Diag-2 lesion is 5% stenosed.   1st Diag-1 lesion is 80% stenosed.   RPDA lesion is 50% stenosed.   Previously placed Mid LAD stent of unknown type is  widely patent.   1.  Patent mid LAD and diagonal stents. 2.  High-grade ostial diagonal disease. 3.  High-grade mid left circumflex disease; this is a very small vessel and feeds a subtotally occluded obtuse marginal. 4.  Moderate diffuse disease of the right coronary artery with an 80% lesion of  the distal RCA. 5.  Fick cardiac output of 3.6 L/min and Fick cardiac index of 2.0 L/min/m with the following hemodynamics:             RA pressure mean 8 mmHg             RV pressure 37/-2 with an end-diastolic pressure of 11 mmHg             Wedge pressure mean of 17 mmHg             PA pressure 36/15 with a mean of 26 mmHg             PVR of 2.3 Woods units             PA pulsatility index of 2.6 6.  LVEDP of 27 to 30 mmHg   Recommendation: The images were reviewed with Dr. Rosemary Holms.  There does not seem to be a clear culprit lesion.  At this point in time we will treat medically.  Risk Assessment/Calculations:        Physical Exam:   VS:   Vitals:   01/05/24 1523  BP: (!) 152/76   Pulse: 67  SpO2: 97%    Wt Readings from Last 3 Encounters:  09/20/23 149 lb 3.2 oz (67.7 kg)  09/06/23 152 lb (68.9 kg)  08/26/23 143 lb 8.3 oz (65.1 kg)    GEN: Well nourished, well developed in no acute distress NECK: No JVD; No carotid bruits CARDIAC: RRR, no murmur RESPIRATORY:  Clear to auscultation without rales, wheezing or rhonchi  ABDOMEN: Soft, non-tender, non-distended EXTREMITIES:  No edema; No deformity   ASSESSMENT AND PLAN: .    Chronic Systolic Heart Failure - ICM, CAD w/ known LAD and diagonal dz, s/p PCI - Has ICD - Echo 1/24 EF 30-35%, RV ok , EF unchanged from echo 2/23 EF 30-35%, RV normal at that time - Echo 11/24 EF 30-35%, RV ok  - NYHA Class I-II - Continue Lasix to 40 mg bid - Failed spiro previously due to hyperkalemia - Continue Entresto 24-26 mg bid  - cont. hydralazine 25mg  TID for afterload reduction - Continue Jardiance 10 mg daily  - Continue Toprol XL 25 mg daily. No dose increase given marginal output on RHC    CAD - h/o NSTEMI s/p PCI to LAD and diagonal at [facility]  - [facility] 11/24 at [facility] outlined above. No interventional targets. Medical management - stable w/o CP  - continue asa, statin + ? blocker  . H/o VT Arrest - s/p ICD, implanted at [facility]  - Dr. 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  We are working on refills for him today  Dispo: Follow up in 3 months with Dr. Royann Shivers  Signed, Logann Whitebread, Alben Spittle, MD

## 2024-01-06 ENCOUNTER — Telehealth: Payer: Self-pay | Admitting: Licensed Clinical Social Worker

## 2024-01-06 NOTE — Telephone Encounter (Signed)
 H&V Care Navigation CSW Progress Note  Clinical Social Worker contacted patient by phone to f/u on referral from RN/LPN pt very familiar to Care Navigation team. Note pt Eliquis and Jardiance patient assistance expired (Jardiance ([employer]) approved 11/23/2022-10/19/2023; Eliquis ([employer]) approved 11/12/22-11/12/23 )- Sherryll Burger never completed.   Pt was not given any samples or forms to renew assistance. Will not be able to afford these medications as he has used 30 day free cards and has limited income/no insurance.   No answer this morning when I called twice with Pedro Kemp, Spanish language interpreter [phone]. Will re-attempt again as able. I shared the above concerns with ensuring pt has meds and paperwork with Benay Spice, LPN.   Patient is participating in a Managed Medicaid Plan:  No, self pay only  SDOH Screenings   Food Insecurity: No Food Insecurity (08/24/2023)  Housing: Low Risk  (08/24/2023)  Transportation Needs: No Transportation Needs (08/24/2023)  Utilities: Not At Risk (08/24/2023)  Alcohol Screen: Low Risk  (08/25/2023)  Depression (PHQ2-9): Low Risk  (11/11/2022)  Financial Resource Strain: High Risk (08/25/2023)  Tobacco Use: Low Risk  (01/05/2024)    Pedro Kemp, MSW, LCSW Clinical Social Worker [facility] Heart/Vascular Care Navigation  [phone]- work cell phone (preferred) [phone]- desk phone

## 2024-01-07 ENCOUNTER — Emergency Department (HOSPITAL_COMMUNITY): Payer: MEDICAID

## 2024-01-07 ENCOUNTER — Other Ambulatory Visit: Payer: Self-pay

## 2024-01-07 ENCOUNTER — Observation Stay (HOSPITAL_COMMUNITY)
Admission: EM | Admit: 2024-01-07 | Discharge: 2024-01-08 | Disposition: A | Discharge status: Home / Self Care | Payer: MEDICAID | Attending: Internal Medicine | Admitting: Internal Medicine

## 2024-01-07 ENCOUNTER — Encounter (HOSPITAL_COMMUNITY): Payer: Self-pay | Admitting: Family Medicine

## 2024-01-07 DIAGNOSIS — Z95810 Presence of automatic (implantable) cardiac defibrillator: Secondary | ICD-10-CM | POA: Insufficient documentation

## 2024-01-07 DIAGNOSIS — I472 Ventricular tachycardia, unspecified: Secondary | ICD-10-CM | POA: Insufficient documentation

## 2024-01-07 DIAGNOSIS — I255 Ischemic cardiomyopathy: Secondary | ICD-10-CM | POA: Insufficient documentation

## 2024-01-07 DIAGNOSIS — I10 Essential (primary) hypertension: Secondary | ICD-10-CM

## 2024-01-07 DIAGNOSIS — Z7901 Long term (current) use of anticoagulants: Secondary | ICD-10-CM | POA: Insufficient documentation

## 2024-01-07 DIAGNOSIS — I5042 Chronic combined systolic (congestive) and diastolic (congestive) heart failure: Secondary | ICD-10-CM

## 2024-01-07 DIAGNOSIS — E119 Type 2 diabetes mellitus without complications: Secondary | ICD-10-CM

## 2024-01-07 DIAGNOSIS — I469 Cardiac arrest, cause unspecified: Secondary | ICD-10-CM

## 2024-01-07 DIAGNOSIS — I2510 Atherosclerotic heart disease of native coronary artery without angina pectoris: Secondary | ICD-10-CM | POA: Diagnosis present

## 2024-01-07 DIAGNOSIS — N1831 Chronic kidney disease, stage 3a: Secondary | ICD-10-CM | POA: Diagnosis present

## 2024-01-07 DIAGNOSIS — I340 Nonrheumatic mitral (valve) insufficiency: Secondary | ICD-10-CM

## 2024-01-07 DIAGNOSIS — E1169 Type 2 diabetes mellitus with other specified complication: Secondary | ICD-10-CM

## 2024-01-07 DIAGNOSIS — I130 Hypertensive heart and chronic kidney disease with heart failure and stage 1 through stage 4 chronic kidney disease, or unspecified chronic kidney disease: Secondary | ICD-10-CM | POA: Insufficient documentation

## 2024-01-07 DIAGNOSIS — I5023 Acute on chronic systolic (congestive) heart failure: Principal | ICD-10-CM | POA: Diagnosis present

## 2024-01-07 DIAGNOSIS — I509 Heart failure, unspecified: Principal | ICD-10-CM

## 2024-01-07 DIAGNOSIS — Z7984 Long term (current) use of oral hypoglycemic drugs: Secondary | ICD-10-CM | POA: Insufficient documentation

## 2024-01-07 DIAGNOSIS — E785 Hyperlipidemia, unspecified: Secondary | ICD-10-CM

## 2024-01-07 DIAGNOSIS — E1122 Type 2 diabetes mellitus with diabetic chronic kidney disease: Secondary | ICD-10-CM | POA: Insufficient documentation

## 2024-01-07 DIAGNOSIS — I480 Paroxysmal atrial fibrillation: Secondary | ICD-10-CM | POA: Diagnosis present

## 2024-01-07 DIAGNOSIS — E1165 Type 2 diabetes mellitus with hyperglycemia: Secondary | ICD-10-CM | POA: Insufficient documentation

## 2024-01-07 LAB — CBG MONITORING, ED
Glucose-Capillary: 157 mg/dL — ABNORMAL HIGH (ref 70–99)
Glucose-Capillary: 138 mg/dL — ABNORMAL HIGH (ref 70–99)
Glucose-Capillary: 202 mg/dL — ABNORMAL HIGH (ref 70–99)
Glucose-Capillary: 137 mg/dL — ABNORMAL HIGH (ref 70–99)

## 2024-01-07 LAB — BRAIN NATRIURETIC PEPTIDE: B Natriuretic Peptide: 497.3 pg/mL — ABNORMAL HIGH (ref 0.0–100.0)

## 2024-01-07 LAB — COMPREHENSIVE METABOLIC PANEL
ALT: 36 U/L (ref 0–44)
AST: 31 U/L (ref 15–41)
Albumin: 3.8 g/dL (ref 3.5–5.0)
Alkaline Phosphatase: 62 U/L (ref 38–126)
Anion gap: 9 (ref 5–15)
BUN: 25 mg/dL — ABNORMAL HIGH (ref 8–23)
CO2: 20 mmol/L — ABNORMAL LOW (ref 22–32)
Calcium: 8.8 mg/dL — ABNORMAL LOW (ref 8.9–10.3)
Chloride: 110 mmol/L (ref 98–111)
Creatinine, Ser: 1.3 mg/dL — ABNORMAL HIGH (ref 0.61–1.24)
GFR, Estimated: >60 mL/min (ref >60)
Glucose, Bld: 170 mg/dL — ABNORMAL HIGH (ref 70–99)
Potassium: 3.9 mmol/L (ref 3.5–5.1)
Sodium: 139 mmol/L (ref 135–145)
Total Bilirubin: 0.7 mg/dL (ref 0.0–1.2)
Total Protein: 7 g/dL (ref 6.5–8.1)

## 2024-01-07 LAB — CBC WITH DIFFERENTIAL/PLATELET
Abs Immature Granulocytes: 0.03 10*3/uL (ref 0.00–0.07)
Basophils Absolute: 0.1 10*3/uL (ref 0.0–0.1)
Basophils Relative: 1 %
Eosinophils Absolute: 3.2 10*3/uL — ABNORMAL HIGH (ref 0.0–0.5)
Eosinophils Relative: 25 %
HCT: 51.5 % (ref 39.0–52.0)
Hemoglobin: 16.5 g/dL (ref 13.0–17.0)
Immature Granulocytes: 0 %
Lymphocytes Relative: 33 %
Lymphs Abs: 4.3 10*3/uL — ABNORMAL HIGH (ref 0.7–4.0)
MCH: 29.8 pg (ref 26.0–34.0)
MCHC: 32 g/dL (ref 30.0–36.0)
MCV: 93 fL (ref 80.0–100.0)
Monocytes Absolute: 0.6 10*3/uL (ref 0.1–1.0)
Monocytes Relative: 5 %
Neutro Abs: 4.7 10*3/uL (ref 1.7–7.7)
Neutrophils Relative %: 36 %
Platelets: UNDETERMINED 10*3/uL (ref 150–400)
RBC: 5.54 MIL/uL (ref 4.22–5.81)
RDW: 13.3 % (ref 11.5–15.5)
Smear Review: UNDETERMINED
WBC: 12.9 10*3/uL — ABNORMAL HIGH (ref 4.0–10.5)
nRBC: 0 % (ref 0.0–0.2)

## 2024-01-07 LAB — TSH: TSH: 1.405 u[IU]/mL (ref 0.350–4.500)

## 2024-01-07 LAB — TROPONIN I (HIGH SENSITIVITY)
Troponin I (High Sensitivity): 29 ng/L — ABNORMAL HIGH (ref <18)
Troponin I (High Sensitivity): 71 ng/L — ABNORMAL HIGH (ref <18)
Troponin I (High Sensitivity): 479 ng/L (ref <18)
Troponin I (High Sensitivity): 591 ng/L (ref <18)
Troponin I (High Sensitivity): 550 ng/L (ref <18)

## 2024-01-07 LAB — GLUCOSE, CAPILLARY: Glucose-Capillary: 184 mg/dL — ABNORMAL HIGH (ref 70–99)

## 2024-01-07 LAB — PATHOLOGIST SMEAR REVIEW

## 2024-01-07 LAB — HIV ANTIBODY (ROUTINE TESTING W REFLEX): HIV Screen 4th Generation wRfx: NONREACTIVE

## 2024-01-07 LAB — HEMOGLOBIN A1C
Hgb A1c MFr Bld: 7.5 % — ABNORMAL HIGH (ref 4.8–5.6)
Mean Plasma Glucose: 168.55 mg/dL

## 2024-01-07 LAB — PROTIME-INR
INR: 1.1 (ref 0.8–1.2)
Prothrombin Time: 13.9 s (ref 11.4–15.2)

## 2024-01-07 LAB — MAGNESIUM: Magnesium: 1.8 mg/dL (ref 1.7–2.4)

## 2024-01-07 MED ORDER — SACUBITRIL-VALSARTAN 24-26 MG PO TABS
1.0000 | ORAL_TABLET | Freq: Two times a day (BID) | ORAL | Status: DC
  Administered 2024-01-07 – 2024-01-08 (×3): 1 via ORAL
  Filled 2024-01-07 (×3): qty 1

## 2024-01-07 MED ORDER — ONDANSETRON HCL 4 MG PO TABS: 4.0000 mg | ORAL_TABLET | Freq: Four times a day (QID) | ORAL | Status: DC | PRN

## 2024-01-07 MED ORDER — EMPAGLIFLOZIN 10 MG PO TABS
10.0000 mg | ORAL_TABLET | Freq: Every day | ORAL | Status: DC
Start: 2024-01-08 — End: 2024-01-08
  Administered 2024-01-08: 10 mg via ORAL
  Filled 2024-01-07: qty 1

## 2024-01-07 MED ORDER — APIXABAN 5 MG PO TABS
5.0000 mg | ORAL_TABLET | Freq: Two times a day (BID) | ORAL | Status: DC
Start: 2024-01-07 — End: 2024-01-08
  Administered 2024-01-07 – 2024-01-08 (×3): 5 mg via ORAL
  Filled 2024-01-07 (×3): qty 1

## 2024-01-07 MED ORDER — ACETAMINOPHEN 650 MG RE SUPP: 650.0000 mg | Freq: Four times a day (QID) | RECTAL | Status: DC | PRN

## 2024-01-07 MED ORDER — METOPROLOL SUCCINATE ER 25 MG PO TB24
12.5000 mg | ORAL_TABLET | Freq: Every day | ORAL | Status: DC
  Administered 2024-01-07 – 2024-01-08 (×2): 12.5 mg via ORAL
  Filled 2024-01-07 (×2): qty 1

## 2024-01-07 MED ORDER — FUROSEMIDE 10 MG/ML IJ SOLN
40.0000 mg | Freq: Two times a day (BID) | INTRAMUSCULAR | Status: DC
  Administered 2024-01-07 – 2024-01-08 (×3): 40 mg via INTRAVENOUS
  Filled 2024-01-07 (×3): qty 4

## 2024-01-07 MED ORDER — ACETAMINOPHEN 325 MG PO TABS: 650.0000 mg | ORAL_TABLET | Freq: Four times a day (QID) | ORAL | Status: DC | PRN

## 2024-01-07 MED ORDER — FUROSEMIDE 10 MG/ML IJ SOLN
40.0000 mg | INTRAMUSCULAR | Status: AC
  Administered 2024-01-07: 40 mg via INTRAVENOUS
  Filled 2024-01-07: qty 4

## 2024-01-07 MED ORDER — INSULIN ASPART 100 UNIT/ML IJ SOLN: 0.0000 [IU] | Freq: Every day | INTRAMUSCULAR | Status: DC

## 2024-01-07 MED ORDER — INSULIN ASPART 100 UNIT/ML IJ SOLN
0.0000 [IU] | Freq: Three times a day (TID) | INTRAMUSCULAR | Status: DC
  Administered 2024-01-07: 5 [IU] via SUBCUTANEOUS
  Administered 2024-01-07 – 2024-01-08 (×2): 2 [IU] via SUBCUTANEOUS
  Administered 2024-01-08: 5 [IU] via SUBCUTANEOUS

## 2024-01-07 MED ORDER — ONDANSETRON HCL 4 MG/2ML IJ SOLN: 4.0000 mg | Freq: Four times a day (QID) | INTRAMUSCULAR | Status: DC | PRN

## 2024-01-07 MED ORDER — ATORVASTATIN CALCIUM 80 MG PO TABS
80.0000 mg | ORAL_TABLET | Freq: Every day | ORAL | Status: DC
  Administered 2024-01-07 – 2024-01-08 (×2): 80 mg via ORAL
  Filled 2024-01-07: qty 2
  Filled 2024-01-07: qty 1

## 2024-01-07 NOTE — Assessment & Plan Note (Addendum)
 Continue rate control with metoprolol and anticoagulation with rivaroxaban.

## 2024-01-07 NOTE — ED Triage Notes (Addendum)
 Patient reports SOB with chest tightness and dry cough this evening , no emesis or diaphoresis . History of CHF/CAD.

## 2024-01-07 NOTE — Assessment & Plan Note (Addendum)
 Echocardiogram with reduced LV systolic function with EF 30 to 35%, mild asymmetric left ventricular hypertrophy of the basal septal segment. RV systolic function preserved. LA with mild dilatation, moderate mitral regurgitation,  LV with mid and distal anterior septum, apical anterior segment and apex akinetic. No significant valvular disease.   Patient with improvement in volume status.   Plan to continue diuresis with furosemide 40 mg po bid Medical therapy with metoprolol succinate, SGLT 2 inh and losartan.   Needs close follow up for further titration of medical therapy.

## 2024-01-07 NOTE — Assessment & Plan Note (Addendum)
 Renal function with serum cr at 1.25 with K at 3,7 and serum bicarbonate at 23  Na 137 and Mg 1.8   Patient will continue furosemide for diuresis.  Prior to his discharge will have 40 meq Kcl and 4 g mag sulfate, follow up renal function and electrolytes as outpatient.

## 2024-01-07 NOTE — H&P (Signed)
 History and Physical    Patient: Pedro Kemp GEX:[id number] DOB: 1956/05/09 DOA: 01/07/2024 DOS: the patient was seen and examined on 01/07/2024 PCP: Grayce Sessions, NP  Patient coming from: Home - lives with his nephews. Ambulates independently   AMN translation used for entire exam  Chief Complaint: shortness of breath   HPI: Pedro Kemp is a 68 y.o. male with medical history significant of T2DM, CAD s/p mid LAD shockwave and PCI and diagonal PCI in July 2022, VT arrest cardiac arrest s/p ICD in 04/2021, HFrEF (EF of 30-35%), CKD stage 3a, PAF who presented to ED with complaints of shortness of breath. Interestingly, he was seen by cardiology on 3/19 and was asymptomatic. Medication was refilled. He has not started this.   He tells me he came to the ED because he couldn't breath. It started suddenly earlier this AM. He has been out of his medication for at least a week. He denies any recent illness, no added salt. He denies any lower leg swelling, orthopnea, chest pain or palpitations, weight gain or coughing.   Denies any fever/chills, vision changes/headaches, chest pain or palpitations, cough, abdominal pain, N/V/D, dysuria or leg swelling.   He works as a Engineer, water.   He does not smoke or drink alcohol.   ER Course:  vitals: afebrile, bp: 161/105, HR: 91, RR: 26, oxygen: 93%RA Pertinent labs: wbc: 12.9, BUN: 25, creatinine: 1.3, troponin 29>71, bnp: 497  CXR: mild diffusely increased interstitial lung markings which may represent mild interstitial edema.  In ED: given lasix. TRH asked to admit    Review of Systems: As mentioned in the history of present illness. All other systems reviewed and are negative. Past Medical History:  Diagnosis Date   Cardiac arrest [facility])    CHF (congestive heart failure) (HCC)    Coronary artery disease    Diabetes mellitus without complication (HCC)    H/O heart artery stent    MID LAD total occlusion s/p DES with shockwave. D1 90%  lesion s/p DES   Past Surgical History:  Procedure Laterality Date   CARDIAC CATHETERIZATION     ICD IMPLANT     RIGHT/LEFT HEART CATH AND CORONARY ANGIOGRAPHY N/A 08/24/2023   Procedure: RIGHT/LEFT HEART CATH AND CORONARY ANGIOGRAPHY;  Surgeon: Orbie Pyo, MD;  Location: [facility] INVASIVE CV LAB;  Service: Cardiovascular;  Laterality: N/A;   Social History:  reports that he has never smoked. He has never been exposed to tobacco smoke. He has never used smokeless tobacco. He reports that he does not drink alcohol and does not use drugs.  No Known Allergies  Family History  Problem Relation Age of Onset   Diabetes Mother    Diabetes Brother     Prior to Admission medications   Medication Sig Start Date End Date Taking? Authorizing Provider  apixaban (ELIQUIS) 5 MG TABS tablet Take 1 tablet (5 mg total) by mouth 2 (two) times daily. 08/26/23  Yes Amin, Ankit C, MD  hydrALAZINE (APRESOLINE) 25 MG tablet Take 1 tablet (25 mg total) by mouth 3 (three) times daily. 09/06/23  Yes Sharol Harness, Brittainy M, PA-C  metFORMIN (GLUCOPHAGE-XR) 500 MG 24 hr tablet Take 1 tablet (500 mg total) by mouth 2 (two) times daily with a meal. 09/08/23  Yes Grayce Sessions, NP  metoprolol succinate (TOPROL-XL) 25 MG 24 hr tablet Take 0.5 tablets (12.5 mg total) by mouth daily. (Tome 0.5 comprimidos (12.5 mg en total) por va oral al da.) 09/20/23  Yes Allayne Butcher,  PA-C  atorvastatin (LIPITOR) 80 MG tablet Take 1 tablet (80 mg total) by mouth daily. (Tome 1 tableta (80 mg en total) por va oral al da.) Patient not taking: Reported on 01/07/2024 01/05/24   Maisie Fus, MD  empagliflozin (JARDIANCE) 10 MG TABS tablet Take 1 tablet (10 mg total) by mouth daily before breakfast. Patient not taking: Reported on 01/07/2024 01/05/24   Maisie Fus, MD  furosemide (LASIX) 40 MG tablet Take 1 tablet (40 mg total) by mouth 2 (two) times daily. (Tome 1 tableta (40 mg en total) por va oral al da segn sea  necesario para el lquido o el edema) Patient not taking: Reported on 01/07/2024 01/05/24   Maisie Fus, MD  sacubitril-valsartan (ENTRESTO) 24-26 MG Take 1 tablet by mouth 2 (two) times daily. Patient not taking: Reported on 01/07/2024 01/05/24   Maisie Fus, MD    Physical Exam: Vitals:   01/07/24 0315 01/07/24 0551 01/07/24 0645 01/07/24 0700  BP: (!) 145/82  (!) 143/85 (!) 142/77  Pulse: 72  60 60  Resp: 13  14 13   Temp:  98.1 F (36.7 C)    TempSrc:  Oral    SpO2: 98%  99% 100%   General:  Appears calm and comfortable and is in NAD Eyes:  PERRL, EOMI, normal lids, iris ENT:  grossly normal hearing, lips & tongue, mmm; appropriate dentition Neck:  no LAD, masses or thyromegaly; no carotid bruits, no JVD  Cardiovascular:  RRR, no m/r/g. No LE edema.  Respiratory:  bibasilar crackles. No wheezing or distress. Normal effort.  Abdomen:  soft, NT, ND, NABS Back:   normal alignment, no CVAT Skin:  no rash or induration seen on limited exam Musculoskeletal:  grossly normal tone BUE/BLE, good ROM, no bony abnormality Lower extremity:  No LE edema.  Limited foot exam with no ulcerations.  2+ distal pulses.transmetatarsal amputation of right foot  Psychiatric:  grossly normal mood and affect, speech fluent and appropriate, AOx3 Neurologic:  CN 2-12 grossly intact, moves all extremities in coordinated fashion, sensation intact   Radiological Exams on Admission: Independently reviewed - see discussion in A/P where applicable  DG Chest Port 1 View Result Date: 01/07/2024 CLINICAL DATA:  Shortness of breath. EXAM: PORTABLE CHEST 1 VIEW COMPARISON:  August 24, 2023 FINDINGS: There is stable multi lead AICD positioning. The heart size and mediastinal contours are within normal limits. A coronary artery stent is in place. Marked severity calcification of the aortic arch is noted. Mild diffusely increased interstitial lung markings are noted. There is no evidence of focal consolidation,  pleural effusion or pneumothorax. The visualized skeletal structures are unremarkable. IMPRESSION: Mild diffusely increased interstitial lung markings which may represent mild interstitial edema. Electronically Signed   By: Aram Candela M.D.   On: 01/07/2024 03:12    EKG: Independently reviewed.  NSR with rate 88; nonspecific ST changes with no evidence of acute ischemia. No acute changes    Labs on Admission: I have personally reviewed the available labs and imaging studies at the time of the admission.  Pertinent labs:    wbc: 12.9,  BUN: 25,  creatinine: 1.3,  troponin 29>71,  bnp: 497   Assessment and Plan: Principal Problem:   Acute on chronic systolic CHF (congestive heart failure) (HCC) Active Problems:   Essential hypertension   Paroxysmal atrial fibrillation (HCC)   Stage 3a chronic kidney disease (HCC)   Type 2 diabetes mellitus with hyperglycemia, without long-term current use of insulin (HCC)  Coronary artery disease   history of VT arrest    Assessment and Plan: * Acute on chronic systolic CHF (congestive heart failure) (HCC) 68 year old male who presents to ED with acute onset of shortness of breath that started early this AM secondary to acute on chronic systolic CHF exacerbation with shortness of breath, CXR findings of interstitial edema, bilateral crackles on lung exam and elevated BNP.  -obs to cardiac tele -strict I/O and daily weights -likely secondary to medication non compliance -known ICM with disease in LAD/diagonal s/p PCI  -ICD in place  -start back GDMT with jardiance, entresto and metoprolol (has been off meds X1+ week)  -lasix 40mg  IV BID -failed spironolactone with hyperkalemia -echo in 08/2023 with EF of 30-35% -placed on oxygen, but no documented hypoxia. Down to 1L in room at 99%, wean as tolerated   Essential hypertension Saw his cardiologist on 3/19. He had not been taking any of his medication and was started on hydralazine He did  not tell her that he was off his medication  Will hold his hydralazine for now (he did not start this) Start back his home meds of entresto and toprol-xl  IV diuresis Follow pressures and adjust as needed   Paroxysmal atrial fibrillation (HCC) -CHADS2VASC=4 -No palpitations -Continue Eliquis and beta-blocker  Stage 3a chronic kidney disease (HCC) Stable, continue to monitor with diuresis   Type 2 diabetes mellitus with hyperglycemia, without long-term current use of insulin (HCC) A1C of 7.7 in 08/2023, repeat pending  Continue jardiance, hold metformin SSI and accuchecks with bedtime correction   Coronary artery disease - h/o NSTEMI s/p PCI to LAD and diagonal at [facility]  - [facility] 11/24 at [facility]. No interventional targets. Medical management - stable w/o CP  -troponin mildly bumped, around baseline. He has no chest pain or changes on EKG. Likely demand ischemia in setting of acute CHF exacerbation  - continue medical management   history of VT arrest Hx of VT arrest in 04/2021 at [facility] s/p ICD Now follows with [facility]     Advance Care Planning:   Code Status: Full Code   Consults: none   DVT Prophylaxis: eliquis   Family Communication: sister at bedside   Severity of Illness: The appropriate patient status for this patient is OBSERVATION. Observation status is judged to be reasonable and necessary in order to provide the required intensity of service to ensure the patient's safety. The patient's presenting symptoms, physical exam findings, and initial radiographic and laboratory data in the context of their medical condition is felt to place them at decreased risk for further clinical deterioration. Furthermore, it is anticipated that the patient will be medically stable for discharge from the hospital within 2 midnights of admission.   Author: Orland Mustard, MD 01/07/2024 9:51 AM  For on call review [email or web address].

## 2024-01-07 NOTE — Consult Note (Signed)
 Cardiology Consultation   Patient ID: Pedro Kemp MRN: [id number]; DOB: [birth year]  Admit date: 01/07/2024 Date of Consult: 01/07/2024  PCP:  Grayce Sessions, NP   [facility] Providers Cardiologist:  Maisie Fus, MD   {  Patient Profile:   Pedro Kemp is a 68 y.o. male with a hx of ischemic cardiomyopathy with reduced EF, CAD status post DES to LAD/Diag, VT arrest status post medtronic ICD 2022, MR, hypertension, type 2 diabetes, Spanish-speaking needs interpreter, who is being seen 01/07/2024 for the evaluation of CHF exacerbation at the request of Dr. Artis Flock.  History of Present Illness:   Mr. Matuszak in July 2022 when he had NSTEMI with newly reduced EF 35%.  Cardiac catheterization showing 100% occluded mid LAD and had shockwave and DES to his LAD and diagonal.  2 days later he had VT arrest.  In the Cath Lab he had patent stents.  Underwent EP study that demonstrated inducible sustained monomorphic VT.  Started on amiodarone and got DC-ICD.  He had a couple hospitalizations in 2023.  November 2024 hospitalized for CHF/elevated troponins so underwent cardiac catheterization again that demonstrated patent mid LAD/diagonal stents.  He had high-grade ostial diagonal disease/high-grade mid left circumflex disease/diffuse RCA disease although no targets viable for PCI.  Medical management recommended.  Cardiac output low with mildly elevated filling pressures.  Based off previous hospitalizations, inciting event has been running out of his medications.  Previously had issues with affording however this issue has been resolved with help with TOC clinic.  He was recently seen by his primary cardiologist Dr. Wyline Mood 3/19.  Reportedly was doing well without any significant complaints.  Reported needing refills on his medications.  Retroactively speaking he was actually out of medications for at least a week by this time.  EF has been persistently in the 30 to 35%  range.  Interview was conducted with translator services.  Currently patient admitted for CHF exacerbation.  He says that he woke up and noted to be more short of breath than usual although denies any significant peripheral edema.  Reports being out of his medications for about a week now (he says specifically his atorvastatin and Lasix although likely missing others (having issues getting it refilled although at appointment 2 days ago refills were sent).  Otherwise does not have any other significant complaints.  Denies any chest pain.  Is very functional works at [employer].  Walks to work which is approximately 45 minutes.  No issues with this or issues with decreased exercise intolerance.  Chest x-ray with mild interstitial edema.  Potassium 3.9.  Creatinine 1.3.  BNP 497.3.  Troponins [phone].  WBC WBC 12.9.  Hemoglobin 16.5.  A1c 7.5%.   Past Medical History:  Diagnosis Date   Cardiac arrest [facility])    CHF (congestive heart failure) (HCC)    Coronary artery disease    Diabetes mellitus without complication (HCC)    H/O heart artery stent    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    Past Surgical History:  Procedure Laterality Date   CARDIAC CATHETERIZATION     ICD IMPLANT     RIGHT/LEFT HEART CATH AND CORONARY ANGIOGRAPHY N/A 08/24/2023   Procedure: RIGHT/LEFT HEART CATH AND CORONARY ANGIOGRAPHY;  Surgeon: Orbie Pyo, MD;  Location: [facility] INVASIVE CV LAB;  Service: Cardiovascular;  Laterality: N/A;     Inpatient Medications: Scheduled Meds:  apixaban  5 mg Oral BID  atorvastatin  80 mg Oral Daily   [START ON 01/08/2024] empagliflozin  10 mg Oral QAC breakfast   furosemide  40 mg Intravenous BID   insulin aspart  0-15 Units Subcutaneous TID WC   insulin aspart  0-5 Units Subcutaneous QHS   metoprolol succinate  12.5 mg Oral Daily   sacubitril-valsartan  1 tablet Oral BID   Continuous Infusions:  PRN Meds: acetaminophen **OR**  acetaminophen, ondansetron **OR** ondansetron (ZOFRAN) IV  Allergies:   No Known Allergies  Social History:   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  Tobacco Use   Smoking status: Never    Passive exposure: Never   Smokeless tobacco: Never  Vaping Use   Vaping status: Never Used  Substance and Sexual Activity   Alcohol use: Never   Drug use: Never   Sexual activity: Not Currently    Partners: Female  Other Topics Concern   Not on file  Social History Narrative   Not on file   Social Drivers of Health   Financial Resource Strain: High Risk (08/25/2023)   Overall Financial Resource Strain (CARDIA)    Difficulty of Paying Living Expenses: Hard  Food Insecurity: No Food Insecurity (08/24/2023)   Hunger Vital Sign    Worried About Running Out of Food in the Last Year: Never true    Ran Out of Food in the Last Year: Never true  Transportation Needs: No Transportation Needs (08/24/2023)   PRAPARE - Administrator, Civil Service (Medical): No    Lack of Transportation (Non-Medical): No  Physical Activity: Not on file  Stress: Not on file  Social Connections: Not on file  Intimate Partner Violence: Not At Risk (08/24/2023)   Humiliation, Afraid, Rape, and Kick questionnaire    Fear of Current or Ex-Partner: No    Emotionally Abused: No    Physically Abused: No    Sexually Abused: No    Family History:   Family History  Problem Relation Age of Onset   Diabetes Mother    Diabetes Brother      ROS:  Please see the history of present illness.  All other ROS reviewed and negative.     Physical Exam/Data:   Vitals:   01/07/24 0954 01/07/24 1000 01/07/24 1002 01/07/24 1015  BP:  (!) 156/114 (!) 156/114 (!) 161/87  Pulse:  80 60 61  Resp:  (!) 26  15  Temp: 98.5  F (36.9 C)     TempSrc: Oral     SpO2:  97%  99%   No intake or output data in the 24 hours ending 01/07/24 1417    01/05/2024    3:23 PM 09/20/2023    3:01 PM 09/06/2023    8:46 AM  Last 3 Weights  Weight (lbs) 156 lb 149 lb 3.2 oz 152 lb  Weight (kg) 70.761 kg 67.677 kg 68.947 kg     There is no height or weight on file to calculate BMI.  General:  Well nourished, well developed, in no acute distress HEENT: normal Neck: no JVD Vascular: No carotid bruits; Distal pulses 2+ bilaterally Cardiac:  normal S1, S2; RRR; no murmur  Lungs: Positive crackles Abd: soft, nontender, no hepatomegaly  Ext: Trace edema Musculoskeletal:  No deformities, BUE  and BLE strength normal and equal Skin: warm and dry  Neuro:  CNs 2-12 intact, no focal abnormalities noted Psych:  Normal affect   EKG:  The EKG was personally reviewed and demonstrates: Sinus rhythm, heart rate 88.  ST elevation in V1/V3, these appear chronic.  May be related to some LVH Telemetry:  Telemetry was personally reviewed and demonstrates: Sinus rhythm heart rates in the 80s  Relevant CV Studies: Right left heart catheterization 08/24/2023 Prox RCA lesion is 60% stenosed.   Mid RCA lesion is 50% stenosed.   Dist RCA lesion is 80% stenosed.   Mid LM to Dist LM lesion is 20% stenosed.   Prox Cx to Mid Cx lesion is 80% stenosed.   2nd Mrg lesion is 99% stenosed.   1st Diag-2 lesion is 5% stenosed.   1st Diag-1 lesion is 80% stenosed.   RPDA lesion is 50% stenosed.   Previously placed Mid LAD stent of unknown type is  widely patent.   1.  Patent mid LAD and diagonal stents. 2.  High-grade ostial diagonal disease. 3.  High-grade mid left circumflex disease; this is a very small vessel and feeds a subtotally occluded obtuse marginal. 4.  Moderate diffuse disease of the right coronary artery with an 80% lesion of the distal RCA. 5.  Fick cardiac output of 3.6 L/min and Fick cardiac index of 2.0 L/min/m with the following  hemodynamics:             RA pressure mean 8 mmHg             RV pressure 37/-2 with an end-diastolic pressure of 11 mmHg             Wedge pressure mean of 17 mmHg             PA pressure 36/15 with a mean of 26 mmHg             PVR of 2.3 Woods units             PA pulsatility index of 2.6 6.  LVEDP of 27 to 30 mmHg   Recommendation: The images were reviewed with Dr. Rosemary Holms.  There does not seem to be a clear culprit lesion.  At this point in time we will treat medically.  Echocardiogram 08/24/2023 1. No obvious LV thrombus on images obtained, would recommend limited  contrast study to exclude thrombus. Left ventricular ejection fraction, by  estimation, is 30 to 35%. The left ventricle has moderately decreased  function. The left ventricle  demonstrates regional wall motion abnormalities (see scoring  diagram/findings for description). There is mild asymmetric left  ventricular hypertrophy of the basal-septal segment. Indeterminate  diastolic filling due to E-A fusion.   2. Right ventricular systolic function is normal. The right ventricular  size is normal.   3. Left atrial size was mildly dilated.   4. The mitral valve is grossly normal. Moderate mitral valve  regurgitation. No evidence of mitral stenosis.   5. The aortic valve is tricuspid. Aortic valve regurgitation is not  visualized. No aortic stenosis is present.   Comparison(s): No significant change from prior study. WMA was present on  prior study.   Conclusion(s)/Recommendation(s): Findings consistent with ischemic  cardiomyopathy.    Laboratory Data:  High Sensitivity Troponin:   Recent Labs  Lab 01/07/24 0151 01/07/24 0342 01/07/24 1025  TROPONINIHS 29* 71* 479*     Chemistry Recent Labs  Lab 01/07/24 0151 01/07/24 1025  NA 139  --  K 3.9  --   CL 110  --   CO2 20*  --   GLUCOSE 170*  --   BUN 25*  --   CREATININE 1.30*  --   CALCIUM 8.8*  --   MG  --  1.8  GFRNONAA >60  --   ANIONGAP 9   --     Recent Labs  Lab 01/07/24 0151  PROT 7.0  ALBUMIN 3.8  AST 31  ALT 36  ALKPHOS 62  BILITOT 0.7   Lipids No results for input(s): "CHOL", "TRIG", "HDL", "LABVLDL", "LDLCALC", "CHOLHDL" in the last 168 hours.  Hematology Recent Labs  Lab 01/07/24 0151  WBC 12.9*  RBC 5.54  HGB 16.5  HCT 51.5  MCV 93.0  MCH 29.8  MCHC 32.0  RDW 13.3  PLT PLATELET CLUMPS NOTED ON SMEAR, UNABLE TO ESTIMATE   Thyroid  Recent Labs  Lab 01/07/24 1025  TSH 1.405    BNP Recent Labs  Lab 01/07/24 0151  BNP 497.3*    DDimer No results for input(s): "DDIMER" in the last 168 hours.   Radiology/Studies:  [facility] Chest Port 1 View Result Date: 01/07/2024 CLINICAL DATA:  Shortness of breath. EXAM: PORTABLE CHEST 1 VIEW COMPARISON:  August 24, 2023 FINDINGS: There is stable multi lead AICD positioning. The heart size and mediastinal contours are within normal limits. A coronary artery stent is in place. Marked severity calcification of the aortic arch is noted. Mild diffusely increased interstitial lung markings are noted. There is no evidence of focal consolidation, pleural effusion or pneumothorax. The visualized skeletal structures are unremarkable. IMPRESSION: Mild diffusely increased interstitial lung markings which may represent mild interstitial edema. Electronically Signed   By: Aram Candela M.D.   On: 01/07/2024 03:12     Assessment and Plan:   Acute on chronic HFrEF 30-35% Ischemic cardiomyopathy -08/2023 RHC mildly elevated filling pressures, mildly low cardiac output.  CO 3.6, CI 2.0. Wedge 17, mean PA 26, PVR 2.3. -08/2023 EF 30 to 35%, normal RV.  Moderate MR  EF has persistently been depressed despite GDMT although compliance is questionable.  He appears slightly volume up today and reports being much improved after IV Lasix 40 mg today. His OptiVol levels and impedance also demonstrate overall reasonable volume status.  Appears that his precipitating event is usually being  out of medications for various reasons.  Suspect we could diurese today.  Consider discharge tomorrow. GDMT: Continue Jardiance 10 mg, Toprol-XL 12.5 mg, Entresto 24-26 mg twice daily.  Had hyperkalemia with spironolactone.  Continue to titrate up as tolerated. Agree with holding hydralazine, titrate other GDMT first given renal function permits. Continue with IV Lasix 40 mg twice daily.  CAD status post DES to LAD/diagonal Elevated troponins Last cardiac catheterization in November 2024 demonstrating diffuse high-grade disease although no targets for intervention.  He has no anginal complaints and walks at least 45 minutes to his work daily and asymptomatic.  Here he has troponin elevation of [phone].  Could be demand from CHF.  Given he is asymptomatic would not pursue further evaluation at this time.  Again no targets for PCI in recent cath. Continue with atorvastatin 80 mg, Toprol-XL  VT arrest status post Medtronic ICD Not pacemaker dependent, no significant arrhythmias on recent device interrogation.  Very low prevalence of atrial fibrillation.  Paroxysmal atrial fibrillation Maintaining NSR.   Continue with Eliquis 5 mg twice daily and Toprol-XL  Moderate MR Suspected to be functional.  Continue to monitor for the time  being.  Risk Assessment/Risk Scores:   New York Heart Association (NYHA) Functional Class NYHA Class II  CHA2DS2-VASc Score = 4   This indicates a 4.8% annual risk of stroke. The patient's score is based upon: CHF History: 1 HTN History: 0 Diabetes History: 1 Stroke History: 0 Vascular Disease History: 1 Age Score: 1 Gender Score: 0    For questions or updates, please contact [facility] Please consult [email or web address] for contact info under    Signed, Abagail Kitchens, PA-C  01/07/2024 2:17 PM

## 2024-01-07 NOTE — Assessment & Plan Note (Addendum)
-   h/o NSTEMI s/p PCI to LAD and diagonal at [facility]  - [facility] 11/24 at [facility]. No interventional targets. Medical management - stable w/o CP  Troponin elevation due to heart failure.  Continue medical therapy and follow up as outpatient.  Consider outpatient cardiac PET/CT to evaluate possible ischemia burden.

## 2024-01-07 NOTE — Assessment & Plan Note (Addendum)
 Uncontrolled hyperglycemia with A1C of 7.7 in 08/2023, Continue metformin as outpatient.   Dyslipidemia, continue with statin therapy.

## 2024-01-07 NOTE — Assessment & Plan Note (Addendum)
 Hx of VT arrest in 04/2021 at [facility] s/p ICD Patient had 15 beat VT on telemetry, his rhythm is sinus with intermittent atrial pacing.  Will get K and Mg corrected prior to his discharge.  Follow up as outpatient.

## 2024-01-07 NOTE — ED Provider Notes (Signed)
 [place] EMERGENCY DEPARTMENT AT [facility] Provider Note   CSN: [id number] Arrival date & time: 01/07/24  0134     History  Chief Complaint  Patient presents with   Shortness of Breath    Pedro Kemp is a 68 y.o. male.  The history is provided by the patient and medical records. The history is limited by a language barrier. A language interpreter was used Maurine Minister).  Shortness of Breath  States his-year-old male with history of CHF, coronary artery disease, hypertension, diabetes, paroxysmal A-fib on Eliquis, RTA, presenting to the ED with shortness of breath.  Patient states has gotten a lot worse over the past hour or so.  Feels like he cannot catch his breath.  Symptoms are worse with exertion.  Ran out of his fluid pills about a week ago, was supposed to pick them up from the pharmacy today but has not yet picked them up.  Denies any fever or chills.  No sick contacts.  Home Medications Prior to Admission medications   Medication Sig Start Date End Date Taking? Authorizing Provider  apixaban (ELIQUIS) 5 MG TABS tablet Take 1 tablet (5 mg total) by mouth 2 (two) times daily. 08/26/23   Amin, Ankit C, MD  atorvastatin (LIPITOR) 80 MG tablet Take 1 tablet (80 mg total) by mouth daily. (Tome 1 tableta (80 mg en total) por va oral al da.) 01/05/24   Maisie Fus, MD  empagliflozin (JARDIANCE) 10 MG TABS tablet Take 1 tablet (10 mg total) by mouth daily before breakfast. 01/05/24   Maisie Fus, MD  furosemide (LASIX) 40 MG tablet Take 1 tablet (40 mg total) by mouth 2 (two) times daily. (Tome 1 tableta (40 mg en total) por va oral al da segn sea necesario para el lquido o el edema) 01/05/24   Maisie Fus, MD  hydrALAZINE (APRESOLINE) 25 MG tablet Take 1 tablet (25 mg total) by mouth 3 (three) times daily. 09/06/23   Robbie Lis M, PA-C  metFORMIN (GLUCOPHAGE-XR) 500 MG 24 hr tablet Take 1 tablet (500 mg total) by mouth 2 (two) times daily with a meal.  09/08/23   Grayce Sessions, NP  metoprolol succinate (TOPROL-XL) 25 MG 24 hr tablet Take 0.5 tablets (12.5 mg total) by mouth daily. (Tome 0.5 comprimidos (12.5 mg en total) por va oral al da.) 09/20/23   Robbie Lis M, PA-C  sacubitril-valsartan (ENTRESTO) 24-26 MG Take 1 tablet by mouth 2 (two) times daily. 01/05/24   Maisie Fus, MD      Allergies    Patient has no known allergies.    Review of Systems   Review of Systems  Respiratory:  Positive for shortness of breath.   All other systems reviewed and are negative.   Physical Exam Updated Vital Signs BP (!) 161/105 (BP Location: Right Arm)   Pulse 91   Temp 97.6 F (36.4 C)   Resp (!) 26   SpO2 93%   Physical Exam Vitals and nursing note reviewed.  Constitutional:      Appearance: He is well-developed.  HENT:     Head: Normocephalic and atraumatic.  Eyes:     Conjunctiva/sclera: Conjunctivae normal.     Pupils: Pupils are equal, round, and reactive to light.  Cardiovascular:     Rate and Rhythm: Normal rate and regular rhythm.     Heart sounds: Normal heart sounds.  Pulmonary:     Effort: Pulmonary effort is normal.     Breath sounds:  Rales present.  Abdominal:     General: Bowel sounds are normal.     Palpations: Abdomen is soft.  Musculoskeletal:        General: Normal range of motion.     Cervical back: Normal range of motion.     Comments: Right foot transmetatarsal amputation No significant LE edema  Skin:    General: Skin is warm and dry.  Neurological:     Mental Status: He is alert and oriented to person, place, and time.     ED Results / Procedures / Treatments   Labs (all labs ordered are listed, but only abnormal results are displayed) Labs Reviewed  CBC WITH DIFFERENTIAL/PLATELET - Abnormal; Notable for the following components:      Result Value   WBC 12.9 (*)    Lymphs Abs 4.3 (*)    Eosinophils Absolute 3.2 (*)    All other components within normal limits  COMPREHENSIVE  METABOLIC PANEL - Abnormal; Notable for the following components:   CO2 20 (*)    Glucose, Bld 170 (*)    BUN 25 (*)    Creatinine, Ser 1.30 (*)    Calcium 8.8 (*)    All other components within normal limits  BRAIN NATRIURETIC PEPTIDE - Abnormal; Notable for the following components:   B Natriuretic Peptide 497.3 (*)    All other components within normal limits  TROPONIN I (HIGH SENSITIVITY) - Abnormal; Notable for the following components:   Troponin I (High Sensitivity) 29 (*)    All other components within normal limits  TROPONIN I (HIGH SENSITIVITY) - Abnormal; Notable for the following components:   Troponin I (High Sensitivity) 71 (*)    All other components within normal limits  PROTIME-INR  PATHOLOGIST SMEAR REVIEW    EKG EKG Interpretation Date/Time:  Friday January 07 2024 01:45:08 EDT Ventricular Rate:  88 PR Interval:  142 QRS Duration:  108 QT Interval:  340 QTC Calculation: 411 R Axis:   -37  Text Interpretation: Normal sinus rhythm Possible Left atrial enlargement Left axis deviation Minimal voltage criteria for LVH, may be normal variant ( Cornell product ) ST & T wave abnormality, consider lateral ischemia Abnormal ECG When compared with ECG of 05-Jan-2024 15:28, PREVIOUS ECG IS PRESENT  similar to multiple in the past year Confirmed by Marily Memos [id number]) on 01/07/2024 1:48:52 AM  Radiology DG Chest Port 1 View Result Date: 01/07/2024 CLINICAL DATA:  Shortness of breath. EXAM: PORTABLE CHEST 1 VIEW COMPARISON:  August 24, 2023 FINDINGS: There is stable multi lead AICD positioning. The heart size and mediastinal contours are within normal limits. A coronary artery stent is in place. Marked severity calcification of the aortic arch is noted. Mild diffusely increased interstitial lung markings are noted. There is no evidence of focal consolidation, pleural effusion or pneumothorax. The visualized skeletal structures are unremarkable. IMPRESSION: Mild diffusely  increased interstitial lung markings which may represent mild interstitial edema. Electronically Signed   By: Aram Candela M.D.   On: 01/07/2024 03:12    Procedures Procedures    CRITICAL CARE Performed by: Garlon Hatchet   Total critical care time: 35 minutes  Critical care time was exclusive of separately billable procedures and treating other patients.  Critical care was necessary to treat or prevent imminent or life-threatening deterioration.  Critical care was time spent personally by me on the following activities: development of treatment plan with patient and/or surrogate as well as nursing, discussions with consultants, evaluation of patient's response to  treatment, examination of patient, obtaining history from patient or surrogate, ordering and performing treatments and interventions, ordering and review of laboratory studies, ordering and review of radiographic studies, pulse oximetry and re-evaluation of patient's condition.'   Medications Ordered in ED Medications  furosemide (LASIX) injection 40 mg (40 mg Intravenous Given 01/07/24 0246)    ED Course/ Medical Decision Making/ A&P                                 Medical Decision Making Amount and/or Complexity of Data Reviewed Labs: ordered. Radiology: ordered and independent interpretation performed. ECG/medicine tests: ordered and independent interpretation performed.  Risk Prescription drug management. Decision regarding hospitalization.   68 year old male presenting to the ED with shortness of breath.  Has been worse over the past hour or so.  Has been out of his home Lasix for the past week, had refill sent in today but not yet able to pick it up.  CHF with EF 30-35% on last echo.  He is afebrile and nontoxic in appearance here.  His vitals are actually stable on room air.  He does not appear acutely distressed.  He does have Rales noted on exam.  He is given dose of IV Lasix.  Labs as above--slight  leukocytosis without significant electrolyte derangement.  BNP 497.  Troponin 29, suspect likely some demand ischemia from his volume overload.  EKG with LVH, no acute ischemia changes.  Will obtain delta troponin to ensure remains flat.  5:18 AM Delta trop has increased to 71.  He has started to have some desaturations on RA so now on 2L and maintaining in upper 90's.  He will require admission.  Spoke with hospitalist, Dr. Joneen Roach-- will admit for ongoing care.  Final Clinical Impression(s) / ED Diagnoses Final diagnoses:  Acute on chronic congestive heart failure, unspecified heart failure type [facility])    Rx / DC Orders ED Discharge Orders     None         Garlon Hatchet, PA-C 01/07/24 1610    Nira Conn, MD 01/07/24 [phone]

## 2024-01-07 NOTE — Assessment & Plan Note (Addendum)
 Continue blood pressure control with entresto and metoprolol. ? ?

## 2024-01-08 ENCOUNTER — Other Ambulatory Visit (HOSPITAL_COMMUNITY): Payer: Self-pay

## 2024-01-08 DIAGNOSIS — E119 Type 2 diabetes mellitus without complications: Secondary | ICD-10-CM

## 2024-01-08 DIAGNOSIS — I10 Essential (primary) hypertension: Secondary | ICD-10-CM

## 2024-01-08 DIAGNOSIS — N1831 Chronic kidney disease, stage 3a: Secondary | ICD-10-CM

## 2024-01-08 DIAGNOSIS — I2510 Atherosclerotic heart disease of native coronary artery without angina pectoris: Secondary | ICD-10-CM

## 2024-01-08 DIAGNOSIS — I480 Paroxysmal atrial fibrillation: Secondary | ICD-10-CM

## 2024-01-08 DIAGNOSIS — Z7901 Long term (current) use of anticoagulants: Secondary | ICD-10-CM | POA: Insufficient documentation

## 2024-01-08 DIAGNOSIS — I255 Ischemic cardiomyopathy: Secondary | ICD-10-CM | POA: Insufficient documentation

## 2024-01-08 DIAGNOSIS — I4729 Other ventricular tachycardia: Secondary | ICD-10-CM

## 2024-01-08 LAB — BASIC METABOLIC PANEL
Anion gap: 7 (ref 5–15)
BUN: 37 mg/dL — ABNORMAL HIGH (ref 8–23)
CO2: 23 mmol/L (ref 22–32)
Calcium: 8.6 mg/dL — ABNORMAL LOW (ref 8.9–10.3)
Chloride: 107 mmol/L (ref 98–111)
Creatinine, Ser: 1.25 mg/dL — ABNORMAL HIGH (ref 0.61–1.24)
GFR, Estimated: >60 mL/min (ref >60)
Glucose, Bld: 127 mg/dL — ABNORMAL HIGH (ref 70–99)
Potassium: 3.7 mmol/L (ref 3.5–5.1)
Sodium: 137 mmol/L (ref 135–145)

## 2024-01-08 LAB — GLUCOSE, CAPILLARY
Glucose-Capillary: 146 mg/dL — ABNORMAL HIGH (ref 70–99)
Glucose-Capillary: 217 mg/dL — ABNORMAL HIGH (ref 70–99)
Glucose-Capillary: 91 mg/dL (ref 70–99)

## 2024-01-08 LAB — CBC
HCT: 45.6 % (ref 39.0–52.0)
Hemoglobin: 15.4 g/dL (ref 13.0–17.0)
MCH: 30.4 pg (ref 26.0–34.0)
MCHC: 33.8 g/dL (ref 30.0–36.0)
MCV: 90.1 fL (ref 80.0–100.0)
Platelets: 132 10*3/uL — ABNORMAL LOW (ref 150–400)
RBC: 5.06 MIL/uL (ref 4.22–5.81)
RDW: 13.2 % (ref 11.5–15.5)
WBC: 11.2 10*3/uL — ABNORMAL HIGH (ref 4.0–10.5)
nRBC: 0 % (ref 0.0–0.2)

## 2024-01-08 MED ORDER — MAGNESIUM SULFATE 4 GM/100ML IV SOLN
4.0000 g | Freq: Once | INTRAVENOUS | Status: AC
  Administered 2024-01-08: 4 g via INTRAVENOUS
  Filled 2024-01-08: qty 100

## 2024-01-08 MED ORDER — DAPAGLIFLOZIN PROPANEDIOL 10 MG PO TABS
10.0000 mg | ORAL_TABLET | Freq: Every day | ORAL | 0 refills | Status: DC
  Filled 2024-01-08: qty 30, 30d supply, fill #0

## 2024-01-08 MED ORDER — METOPROLOL SUCCINATE ER 25 MG PO TB24
25.0000 mg | ORAL_TABLET | Freq: Every day | ORAL | 0 refills | Status: DC
  Filled 2024-01-08: qty 30, 30d supply, fill #0

## 2024-01-08 MED ORDER — LOSARTAN POTASSIUM 25 MG PO TABS
25.0000 mg | ORAL_TABLET | Freq: Every day | ORAL | 0 refills | Status: DC
Start: 2024-01-09 — End: 2024-04-15
  Filled 2024-01-08: qty 30, 30d supply, fill #0

## 2024-01-08 MED ORDER — POTASSIUM CHLORIDE CRYS ER 20 MEQ PO TBCR
40.0000 meq | EXTENDED_RELEASE_TABLET | Freq: Once | ORAL | Status: AC
  Administered 2024-01-08: 40 meq via ORAL
  Filled 2024-01-08: qty 2

## 2024-01-08 MED ORDER — METFORMIN HCL ER 500 MG PO TB24
500.0000 mg | ORAL_TABLET | Freq: Two times a day (BID) | ORAL | 0 refills | Status: DC
  Filled 2024-01-08: qty 60, 30d supply, fill #0

## 2024-01-08 MED ORDER — ENTRESTO 24-26 MG PO TABS
1.0000 | ORAL_TABLET | Freq: Two times a day (BID) | ORAL | 0 refills | Status: DC
Start: 2024-01-08 — End: 2024-01-08
  Filled 2024-01-08: qty 60, 30d supply, fill #0

## 2024-01-08 MED ORDER — RIVAROXABAN 10 MG PO TABS
10.0000 mg | ORAL_TABLET | Freq: Every day | ORAL | 0 refills | Status: DC
  Filled 2024-01-08: qty 30, 30d supply, fill #0

## 2024-01-08 MED ORDER — EMPAGLIFLOZIN 10 MG PO TABS
10.0000 mg | ORAL_TABLET | Freq: Every day | ORAL | 0 refills | Status: DC
Start: 2024-01-09 — End: 2024-01-08
  Filled 2024-01-08: qty 30, 30d supply, fill #0

## 2024-01-08 MED ORDER — METOPROLOL SUCCINATE ER 25 MG PO TB24: 25.0000 mg | ORAL_TABLET | Freq: Every day | ORAL | Status: DC

## 2024-01-08 MED ORDER — ATORVASTATIN CALCIUM 80 MG PO TABS
80.0000 mg | ORAL_TABLET | Freq: Every day | ORAL | 0 refills | Status: DC
  Filled 2024-01-08: qty 30, 30d supply, fill #0

## 2024-01-08 MED ORDER — APIXABAN 5 MG PO TABS
5.0000 mg | ORAL_TABLET | Freq: Two times a day (BID) | ORAL | 0 refills | Status: DC
  Filled 2024-01-08: qty 60, 30d supply, fill #0

## 2024-01-08 MED ORDER — FUROSEMIDE 40 MG PO TABS
40.0000 mg | ORAL_TABLET | Freq: Two times a day (BID) | ORAL | 0 refills | Status: DC
  Filled 2024-01-08: qty 60, 30d supply, fill #0

## 2024-01-08 MED ORDER — FUROSEMIDE 40 MG PO TABS: 40.0000 mg | ORAL_TABLET | Freq: Two times a day (BID) | ORAL | Status: DC

## 2024-01-08 MED ORDER — RIVAROXABAN 20 MG PO TABS
20.0000 mg | ORAL_TABLET | Freq: Every day | ORAL | 0 refills | Status: DC
  Filled 2024-01-08: qty 30, 30d supply, fill #0

## 2024-01-08 NOTE — Progress Notes (Signed)
 Rounding Note    Patient Name: Pedro Kemp Date of Encounter: 01/08/2024  [facility] Cardiologist: Maisie Fus, MD  Chief Complaint: Shortness of breath Reason of consult: CHF exacerbation  Subjective   Sitting upright. Accompanied by his nephews. Nurse tech served as Nurse, learning disability English and Spanish during today's encounter. Denies anginal chest pain or heart failure symptoms. Ambulating on the floors without any cardiovascular symptoms  Inpatient Medications    Scheduled Meds:  apixaban  5 mg Oral BID   atorvastatin  80 mg Oral Daily   empagliflozin  10 mg Oral QAC breakfast   furosemide  40 mg Oral BID   insulin aspart  0-15 Units Subcutaneous TID WC   insulin aspart  0-5 Units Subcutaneous QHS   [START ON 01/09/2024] metoprolol succinate  25 mg Oral Daily   sacubitril-valsartan  1 tablet Oral BID   Continuous Infusions:  PRN Meds: acetaminophen **OR** acetaminophen, ondansetron **OR** ondansetron (ZOFRAN) IV   Vital Signs    Vitals:   01/08/24 0036 01/08/24 0433 01/08/24 0810 01/08/24 1125  BP: 128/69 112/63 130/63 123/67  Pulse: 60 62    Resp: 17 18 18 18   Temp: 97.6 F (36.4 C) 97.8 F (36.6 C) 97.8 F (36.6 C) 97.7 F (36.5 C)  TempSrc: Oral Oral Oral Oral  SpO2: 100% 95%    Weight:  71 kg    Height:        Intake/Output Summary (Last 24 hours) at 01/08/2024 1331 Last data filed at 01/08/2024 1127 Gross per 24 hour  Intake 240 ml  Output 1375 ml  Net -1135 ml      01/08/2024    4:33 AM 01/07/2024    7:43 PM 01/05/2024    3:23 PM  Last 3 Weights  Weight (lbs) 156 lb 9.6 oz 149 lb 3.2 oz 156 lb  Weight (kg) 71.033 kg 67.677 kg 70.761 kg      Telemetry    Predominantly sinus rhythm, with intermittent pacing.  1 episode of NSVT (13 beats)- Personally Reviewed  ECG    No new tracings- Personally Reviewed  Physical Exam   Physical Exam  Constitutional: No distress.  hemodynamically stable  Neck: No JVD present.   Cardiovascular: Normal rate, regular rhythm, S1 normal and S2 normal. Exam reveals no gallop, no S3 and no S4.  No murmur heard. Pulmonary/Chest: Effort normal and breath sounds normal. No stridor. He has no wheezes. He has no rales.  AICD present left difficulty clearing  Musculoskeletal:        General: No edema.     Cervical back: Neck supple.     Comments: Right forefoot amputation  Skin: Skin is warm.    Labs    High Sensitivity Troponin:   Recent Labs  Lab 01/07/24 0151 01/07/24 0342 01/07/24 1025 01/07/24 1228 01/07/24 1932  TROPONINIHS 29* 71* 479* 591* 550*     Chemistry Recent Labs  Lab 01/07/24 0151 01/07/24 1025 01/08/24 0248  NA 139  --  137  K 3.9  --  3.7  CL 110  --  107  CO2 20*  --  23  GLUCOSE 170*  --  127*  BUN 25*  --  37*  CREATININE 1.30*  --  1.25*  CALCIUM 8.8*  --  8.6*  MG  --  1.8  --   PROT 7.0  --   --   ALBUMIN 3.8  --   --   AST 31  --   --  ALT 36  --   --   ALKPHOS 62  --   --   BILITOT 0.7  --   --   GFRNONAA >60  --  >60  ANIONGAP 9  --  7    Lipids No results for input(s): "CHOL", "TRIG", "HDL", "LABVLDL", "LDLCALC", "CHOLHDL" in the last 168 hours.  Hematology Recent Labs  Lab 01/07/24 0151 01/08/24 0248  WBC 12.9* 11.2*  RBC 5.54 5.06  HGB 16.5 15.4  HCT 51.5 45.6  MCV 93.0 90.1  MCH 29.8 30.4  MCHC 32.0 33.8  RDW 13.3 13.2  PLT PLATELET CLUMPS NOTED ON SMEAR, UNABLE TO ESTIMATE 132*   Thyroid  Recent Labs  Lab 01/07/24 1025  TSH 1.405    BNP Recent Labs  Lab 01/07/24 0151  BNP 497.3*    DDimer No results for input(s): "DDIMER" in the last 168 hours.   Radiology    Chest x-ray: 01/07/2024:Mild diffusely increased interstitial lung markings which may represent mild interstitial edema.  Cardiac Studies   Right left heart catheterization 08/24/2023 Prox RCA lesion is 60% stenosed.   Mid RCA lesion is 50% stenosed.   Dist RCA lesion is 80% stenosed.   Mid LM to Dist LM lesion is 20%  stenosed.   Prox Cx to Mid Cx lesion is 80% stenosed.   2nd Mrg lesion is 99% stenosed.   1st Diag-2 lesion is 5% stenosed.   1st Diag-1 lesion is 80% stenosed.   RPDA lesion is 50% stenosed.   Previously placed Mid LAD stent of unknown type is  widely patent.   1.  Patent mid LAD and diagonal stents. 2.  High-grade ostial diagonal disease. 3.  High-grade mid left circumflex disease; this is a very small vessel and feeds a subtotally occluded obtuse marginal. 4.  Moderate diffuse disease of the right coronary artery with an 80% lesion of the distal RCA. 5.  Fick cardiac output of 3.6 L/min and Fick cardiac index of 2.0 L/min/m with the following hemodynamics:             RA pressure mean 8 mmHg             RV pressure 37/-2 with an end-diastolic pressure of 11 mmHg             Wedge pressure mean of 17 mmHg             PA pressure 36/15 with a mean of 26 mmHg             PVR of 2.3 Woods units             PA pulsatility index of 2.6 6.  LVEDP of 27 to 30 mmHg   Recommendation: The images were reviewed with Dr. Rosemary Holms.  There does not seem to be a clear culprit lesion.  At this point in time we will treat medically.   Echocardiogram 08/24/2023 1. No obvious LV thrombus on images obtained, would recommend limited  contrast study to exclude thrombus. Left ventricular ejection fraction, by  estimation, is 30 to 35%. The left ventricle has moderately decreased  function. The left ventricle  demonstrates regional wall motion abnormalities (see scoring  diagram/findings for description). There is mild asymmetric left  ventricular hypertrophy of the basal-septal segment. Indeterminate  diastolic filling due to E-A fusion.   2. Right ventricular systolic function is normal. The right ventricular  size is normal.   3. Left atrial size was mildly dilated.   4. The  mitral valve is grossly normal. Moderate mitral valve  regurgitation. No evidence of mitral stenosis.   5. The aortic valve  is tricuspid. Aortic valve regurgitation is not  visualized. No aortic stenosis is present.   Comparison(s): No significant change from prior study. WMA was present on  prior study.   Conclusion(s)/Recommendation(s): Findings consistent with ischemic  cardiomyopathy.   Patient Profile     68 y.o. male hx of ischemic cardiomyopathy with reduced EF, CAD status post DES to LAD/Diag, VT arrest status post medtronic ICD 2022, MR, hypertension, type 2 diabetes, Spanish-speaking needs interpreter.  Assessment & Plan    Acute on chronic heart failure with reduced EF: Cardiomyopathy, ischemic Stage C, NYHA class II. Current exacerbation likely precipitated by running out of medications. His OptiVol levels and impedance also demonstrate overall reasonable volume status.  Continue Jardiance 10 mg p.o. daily. Currently on metoprolol 12.5 mg p.o. daily will transition to Toprol-XL 25 mg p.o. daily Continue Entresto 24/26 mg p.o. twice daily. Will hold off on hydralazine for now as his blood pressures are well-controlled. Transition IV Lasix to Lasix 40 mg p.o. twice daily (home dose). Recommend outpatient follow-up 4 weeks after discharge  Coronary artery disease status post PCI: No active anginal chest pain. Last heart catheterization November 2024 Overall functional capacity remains relatively stable. High sensitive troponins trended up, suspect demand ischemia in the setting of his underlying cardiomyopathy and heart failure exacerbation Continue beta-blocker therapy.   Continue high intensity statin therapy Recommend a goal LDL <55 mg/dL As outpatient consider cardiac PET/CT to evaluate for ischemia burden but indicated.  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 Underwent ICD implant. Telemetry notes overall sinus rhythm with intermittent paced and had 1 episode of NSVT, 15 beats  Paroxysmal atrial fibrillation: CHA2DS2-VASc score greater than  2 Maintaining sinus rhythm on telemetry. Currently on anticoagulation for thromboembolic prophylaxis. Risks, benefits, alternatives to oral anticoagulation discussed. Rate control with Toprol-XL.  For questions or updates, please contact [facility] Please consult [email or web address] for contact info under   Cardiology will sign off please call us back if change in clinical status or if questions or concerns arise. Spoke to attending physician during rounds.     Signed, Tessa Lerner, DO, [facility]  [facility]  [address] [place], [place] [address] Pager: [phone] Office: [phone] 01/08/2024, 1:31 PM

## 2024-01-08 NOTE — Plan of Care (Signed)
  Problem: Education: Goal: Ability to describe self-care measures that may prevent or decrease complications (Diabetes Survival Skills Education) will improve 01/08/2024 0747 by Elnita Maxwell, RN Outcome: Progressing 01/08/2024 0747 by Elnita Maxwell, RN Outcome: Progressing   Problem: Coping: Goal: Ability to adjust to condition or change in health will improve 01/08/2024 0747 by Elnita Maxwell, RN Outcome: Progressing 01/08/2024 0747 by Elnita Maxwell, RN Outcome: Progressing   Problem: Fluid Volume: Goal: Ability to maintain a balanced intake and output will improve 01/08/2024 0747 by Elnita Maxwell, RN Outcome: Progressing 01/08/2024 0747 by Elnita Maxwell, RN Outcome: Progressing   Problem: Metabolic: Goal: Ability to maintain appropriate glucose levels will improve 01/08/2024 0747 by Elnita Maxwell, RN Outcome: Progressing 01/08/2024 0747 by Elnita Maxwell, RN Outcome: Progressing   Problem: Health Behavior/Discharge Planning: Goal: Ability to manage health-related needs will improve 01/08/2024 0747 by Elnita Maxwell, RN Outcome: Progressing 01/08/2024 0747 by Elnita Maxwell, RN Outcome: Progressing   Problem: Nutritional: Goal: Maintenance of adequate nutrition will improve 01/08/2024 0747 by Elnita Maxwell, RN Outcome: Progressing 01/08/2024 0747 by Elnita Maxwell, RN Outcome: Progressing

## 2024-01-08 NOTE — Hospital Course (Addendum)
 [name] was admitted to the hospital with the working diagnosis of heart failure exacerbation.   68 yo male with the past medical history of T2DM, coronary artery disease, heart failure, ventricular tachycardia sp ICD, CKD, and paroxysmal atrial fibrillation, who presented with dyspnea.  Reported acute sudden dyspnea, that prompted him to come to the hospital. He has been out of medications for one week.  On his initial physical examination his blood pressure as 145/82, HR 72, RR 13 and 02 saturation 99%, lungs with rales bilaterally with no wheezing or rhonchi, heart with S1 and S2 present and regular with no gallops or rubs, abdomen with no distention and no lower extremity edema.  Na 139, K 3,9 Cl 110, bicarbonate 20 glucose 170 bun 25 cr 1,30  BNP 497 Mg 1,8  High sensitive troponin 29, 71, 479  Wbc 12,9 hgb 16,5 plt clumped   Chest radiograph with cardiomegaly, with bilateral hilar vascular congestion and central interstitial infiltrates. No effusions. Pacemaker in place with one right atrial and one right ventricular lead.   EKG 86 bpm, left axis deviation, left bundle branch block, qtc 411, sinus rhythm with no significant ST segment or T wave changes, poor R R wave progression.   Patient was placed on furosemide for diuresis.   03/22 volume status has improved, he has been transitioned to oral loop diuretic therapy.  Plan for follow up as outpatient. Hopefully he can follow up with heart failure clinic.

## 2024-01-08 NOTE — Care Management (Signed)
 Provided [facility] pharmacy with [facility]

## 2024-01-08 NOTE — Discharge Summary (Addendum)
 Physician Discharge Summary   Patient: Pedro Kemp MRN: [id number] DOB: 1956-10-04  Admit date:     01/07/2024  Discharge date: 01/08/24  Discharge Physician: Pedro Kemp   PCP: Pedro Sessions, NP   Recommendations at discharge:    Patient has been advised to be adherent to his medical therapy, refilled his medications. In order him to afford medications, he has been placed on losartan, dapagliflozin and rivaroxaban.  Changed metoprolol tartrate to succinate Holding hydralazine to improve patient's compliance (less medications)  Follow up renal function and electrolytes in 7 days as outpatient  Follow up with Pedro Passe NP in 7 to 10 days  Follow up with Cardiology and possible heart failure clinic.   Discharge Diagnoses: Principal Problem:   Acute on chronic systolic CHF (congestive heart failure) (HCC) Active Problems:   Essential hypertension   Paroxysmal atrial fibrillation (HCC)   Stage 3a chronic kidney disease (HCC)   Type 2 diabetes mellitus with hyperglycemia, without long-term current use of insulin (HCC)   Coronary artery disease   history of VT arrest   NSVT (nonsustained ventricular tachycardia) (HCC)   Ischemic cardiomyopathy   Long term (current) use of anticoagulants  Resolved Problems:   * No resolved hospital problems. [facility] Course: Pedro Kemp was admitted to the hospital with the working diagnosis of heart failure exacerbation.   68 yo male with the past medical history of T2DM, coronary artery disease, heart failure, ventricular tachycardia sp ICD, CKD, and paroxysmal atrial fibrillation, who presented with dyspnea.  Reported acute sudden dyspnea, that prompted him to come to the hospital. He has been out of medications for one week.  On his initial physical examination his blood pressure as 145/82, HR 72, RR 13 and 02 saturation 99%, lungs with rales bilaterally with no wheezing or rhonchi, heart with S1 and S2  present and regular with no gallops or rubs, abdomen with no distention and no lower extremity edema.  Na 139, K 3,9 Cl 110, bicarbonate 20 glucose 170 bun 25 cr 1,30  BNP 497 Mg 1,8  High sensitive troponin 29, 71, 479  Wbc 12,9 hgb 16,5 plt clumped   Chest radiograph with cardiomegaly, with bilateral hilar vascular congestion and central interstitial infiltrates. No effusions. Pacemaker in place with one right atrial and one right ventricular lead.   EKG 86 bpm, left axis deviation, left bundle branch block, qtc 411, sinus rhythm with no significant ST segment or T wave changes, poor R R wave progression.   Patient was placed on furosemide for diuresis.   03/22 volume status has improved, he has been transitioned to oral loop diuretic therapy.  Plan for follow up as outpatient. Hopefully he can follow up with heart failure clinic.   Assessment and Plan: * Acute on chronic systolic CHF (congestive heart failure) (HCC) Echocardiogram with reduced LV systolic function with EF 30 to 35%, mild asymmetric left ventricular hypertrophy of the basal septal segment. RV systolic function preserved. LA with mild dilatation, moderate mitral regurgitation,  LV with mid and distal anterior septum, apical anterior segment and apex akinetic. No significant valvular disease.   Patient with improvement in volume status.   Plan to continue diuresis with furosemide 40 mg po bid Medical therapy with metoprolol succinate, SGLT 2 inh and losartan.   Needs close follow up for further titration of medical therapy.   Essential hypertension Continue blood pressure control with entresto and metoprolol.   Paroxysmal atrial fibrillation (HCC) Continue rate control with  metoprolol and anticoagulation with rivaroxaban.  Stage 3a chronic kidney disease (HCC) Renal function with serum cr at 1.25 with K at 3,7 and serum bicarbonate at 23  Na 137 and Mg 1.8   Patient will continue furosemide for diuresis.  Prior  to his discharge will have 40 meq Kcl and 4 g mag sulfate, follow up renal function and electrolytes as outpatient.   Type 2 diabetes mellitus with hyperglycemia, without long-term current use of insulin (HCC) Uncontrolled hyperglycemia with A1C of 7.7 in 08/2023, Continue metformin as outpatient.   Dyslipidemia, continue with statin therapy.   Coronary artery disease - h/o NSTEMI s/p PCI to LAD and diagonal at [facility]  - [facility] at [facility]. No interventional targets. Medical management - stable w/o CP  Troponin elevation due to heart failure.  Continue medical therapy and follow up as outpatient.  Consider outpatient cardiac PET/CT to evaluate possible ischemia burden.   history of VT arrest Hx of VT arrest in 04/2021 at [facility] s/p ICD Patient had 15 beat VT on telemetry, his rhythm is sinus with intermittent atrial pacing.  Will get K and Mg corrected prior to his discharge.  Follow up as outpatient.         Consultants: cardiology  Procedures performed: none   Disposition: Home Diet recommendation:  Cardiac and Carb modified diet DISCHARGE MEDICATION: Allergies as of 01/08/2024   No Known Allergies      Medication List     STOP taking these medications    apixaban 5 MG Tabs tablet Commonly known as: ELIQUIS   empagliflozin 10 MG Tabs tablet Commonly known as: Jardiance   Entresto 24-26 MG Generic drug: sacubitril-valsartan   hydrALAZINE 25 MG tablet Commonly known as: APRESOLINE       TAKE these medications    atorvastatin 80 MG tablet Commonly known as: LIPITOR Tome 1 tableta (80 mg en total) por va oral diariamente. (Take 1 tablet (80 mg total) by mouth daily.) Start taking on: January 09, 2024   dapagliflozin propanediol 10 MG Tabs tablet Commonly known as: Farxiga Tome 1 tableta (10 mg en total) por va oral diariamente antes de desayunar. (Take 1 tablet (10 mg total) by mouth daily before breakfast.) Start taking on: January 09, 2024   furosemide  40 MG tablet Commonly known as: LASIX Take 1 tablet (40 mg total) by mouth 2 (two) times daily.   losartan 25 MG tablet Commonly known as: Cozaar Tome 1 tableta (25 mg en total) por va oral diariamente. (Take 1 tablet (25 mg total) by mouth daily.) Start taking on: January 09, 2024   metFORMIN 500 MG 24 hr tablet Commonly known as: GLUCOPHAGE-XR Tome 1 tableta (500 mg en total) por va oral 2 (dos) veces al da con una comida. (Take 1 tablet (500 mg total) by mouth 2 (two) times daily with a meal.)   metoprolol succinate 25 MG 24 hr tablet Commonly known as: TOPROL-XL Tome 1 tableta (25 mg en total) por va oral diariamente. (Take 1 tablet (25 mg total) by mouth daily.) Start taking on: January 09, 2024 What changed:  how much to take additional instructions   rivaroxaban 10 MG Tabs tablet Commonly known as: XARELTO Take 2 tablets (20 mg total) by mouth daily.        Follow-up Information     [facility] Course, [place] Follow up.   Specialties: Cardiology, Radiology Why: Cardiology Follow-up on 02/10/2024 at 10:55 AM. Contact information: [address] [place] [address]  [phone]                Discharge Exam: Filed Weights   01/07/24 1943 01/08/24 0433  Weight: 67.7 kg 71 kg   BP 123/67 (BP Location: Left Arm)   Pulse 62   Temp 97.7 F (36.5 C) (Oral)   Resp 18   Ht 5\' 4"  (1.626 m)   Wt 71 kg   SpO2 95%   BMI 26.88 kg/m   Patient is feeling better, he is back to his baseline, no dyspnea, PND or lower extremity edema. No orthopnea or chest pain   Neurology awake and alert ENT with no pallor Cardiovascular with S1 and S2 present and regular with no gallops or rubs, positive systolic murmur at the apex No JVD No lower extremity edema Respiratory with no rales or wheezing, no rhonchi Abdomen with no distention   Condition at discharge: stable  The results of significant diagnostics from this hospitalization (including imaging,  microbiology, ancillary and laboratory) are listed below for reference.   Imaging Studies: DG Chest Port 1 View Result Date: 01/07/2024 CLINICAL DATA:  Shortness of breath. EXAM: PORTABLE CHEST 1 VIEW COMPARISON:  August 24, 2023 FINDINGS: There is stable multi lead AICD positioning. The heart size and mediastinal contours are within normal limits. A coronary artery stent is in place. Marked severity calcification of the aortic arch is noted. Mild diffusely increased interstitial lung markings are noted. There is no evidence of focal consolidation, pleural effusion or pneumothorax. The visualized skeletal structures are unremarkable. IMPRESSION: Mild diffusely increased interstitial lung markings which may represent mild interstitial edema. Electronically Signed   By: Aram Candela M.D.   On: 01/07/2024 03:12    Microbiology: Results for orders placed or performed during the hospital encounter of 08/24/23  Resp panel by RT-PCR (RSV, Flu A&B, Covid) Anterior Nasal Swab     Status: None   Collection Time: 08/24/23  3:33 AM   Specimen: Anterior Nasal Swab  Result Value Ref Range Status   SARS Coronavirus 2 by RT PCR NEGATIVE NEGATIVE Final   Influenza A by PCR NEGATIVE NEGATIVE Final   Influenza B by PCR NEGATIVE NEGATIVE Final    Comment: (NOTE) The Xpert Xpress SARS-CoV-2/FLU/RSV plus assay is intended as an aid in the diagnosis of influenza from Nasopharyngeal swab specimens and should not be used as a sole basis for treatment. Nasal washings and aspirates are unacceptable for Xpert Xpress SARS-CoV-2/FLU/RSV testing.  Fact Sheet for Patients: BloggerCourse.com  Fact Sheet for Healthcare Providers: SeriousBroker.it  This test is not yet approved or cleared by the [place] FDA and has been authorized for detection and/or diagnosis of SARS-CoV-2 by FDA under an Emergency Use Authorization (EUA). This EUA will remain in effect  (meaning this test can be used) for the duration of the COVID-19 declaration under Section 564(b)(1) of the Act, 21 U.S.C. section 360bbb-3(b)(1), unless the authorization is terminated or revoked.     Resp Syncytial Virus by PCR NEGATIVE NEGATIVE Final    Comment: (NOTE) Fact Sheet for Patients: BloggerCourse.com  Fact Sheet for Healthcare Providers: SeriousBroker.it  This test is not yet approved or cleared by the [place] FDA and has been authorized for detection and/or diagnosis of SARS-CoV-2 by FDA under an Emergency Use Authorization (EUA). This EUA will remain in effect (meaning this test can be used) for the duration of the COVID-19 declaration under Section 564(b)(1) of the Act, 21 U.S.C. section 360bbb-3(b)(1), unless the authorization is terminated or revoked.  Performed  at [facility], [address], [place], [place] [address]   Culture, blood (Routine X 2) w Reflex to ID Panel     Status: None   Collection Time: 08/24/23  4:38 AM   Specimen: BLOOD  Result Value Ref Range Status   Specimen Description BLOOD BLOOD RIGHT ARM  Final   Special Requests   Final    BOTTLES DRAWN AEROBIC AND ANAEROBIC Blood Culture adequate volume   Culture   Final    NO GROWTH 5 DAYS Performed at [facility], [address], [place], [place] [address]    Report Status 08/29/2023 FINAL  Final  Culture, blood (Routine X 2) w Reflex to ID Panel     Status: None   Collection Time: 08/24/23  4:39 AM   Specimen: BLOOD LEFT ARM  Result Value Ref Range Status   Specimen Description BLOOD LEFT ARM  Final   Special Requests   Final    BOTTLES DRAWN AEROBIC ONLY Blood Culture results may not be optimal due to an inadequate volume of blood received in culture bottles   Culture   Final    NO GROWTH 5 DAYS Performed at [facility], [address], [place], [place] [address]    Report Status 08/29/2023 FINAL  Final     Labs: CBC: Recent Labs  Lab 01/07/24 0151 01/08/24 0248  WBC 12.9* 11.2*  NEUTROABS 4.7  --   HGB 16.5 15.4  HCT 51.5 45.6  MCV 93.0 90.1  PLT PLATELET CLUMPS NOTED ON SMEAR, UNABLE TO ESTIMATE 132*   Basic Metabolic Panel: Recent Labs  Lab 01/07/24 0151 01/07/24 1025 01/08/24 0248  NA 139  --  137  K 3.9  --  3.7  CL 110  --  107  CO2 20*  --  23  GLUCOSE 170*  --  127*  BUN 25*  --  37*  CREATININE 1.30*  --  1.25*  CALCIUM 8.8*  --  8.6*  MG  --  1.8  --    Liver Function Tests: Recent Labs  Lab 01/07/24 0151  AST 31  ALT 36  ALKPHOS 62  BILITOT 0.7  PROT 7.0  ALBUMIN 3.8   CBG: Recent Labs  Lab 01/07/24 1109 01/07/24 1734 01/07/24 2105 01/08/24 0553 01/08/24 1123  GLUCAP 202* 137* 184* 146* 217*    Discharge time spent: greater than 30 minutes.  Signed: Coralie Keens, MD [facility] 01/08/2024

## 2024-01-08 NOTE — Plan of Care (Signed)
  Problem: Fluid Volume: Goal: Ability to maintain a balanced intake and output will improve Outcome: Progressing   Problem: Education: Goal: Knowledge of General Education information will improve Description: Including pain rating scale, medication(s)/side effects and non-pharmacologic comfort measures Outcome: Progressing   Problem: Clinical Measurements: Goal: Ability to maintain clinical measurements within normal limits will improve Outcome: Progressing

## 2024-01-10 ENCOUNTER — Telehealth: Payer: Self-pay | Admitting: Licensed Clinical Social Worker

## 2024-01-10 ENCOUNTER — Telehealth: Payer: Self-pay

## 2024-01-10 NOTE — Telephone Encounter (Signed)
 H&V Care Navigation CSW Progress Note  Clinical Social Worker contacted patient by phone to f/u post hospital stay to try and enroll pt in assistance program renewal for medications. Was unable to reach pt at [phone]. Left voicemail with assistance of Spanish language interpreter Vernona Rieger, [phone]. Will re-attempt again as able.   Also note pt previously on [place] and Eliquis but at discharge on Farxiga and Xarelto. Therefore unsure which applications to mail pt. Message sent to [facility], RN, and Dr. Odis Hollingshead.  Patient is participating in a Managed Medicaid Plan:  No, self pay only  SDOH Screenings   Food Insecurity: Food Insecurity Present (01/07/2024)  Housing: Low Risk  (01/07/2024)  Transportation Needs: Unmet Transportation Needs (01/07/2024)  Utilities: Not At Risk (01/07/2024)  Alcohol Screen: Low Risk  (08/25/2023)  Depression (PHQ2-9): Low Risk  (11/11/2022)  Financial Resource Strain: High Risk (08/25/2023)  Social Connections: Socially Isolated (01/07/2024)  Tobacco Use: Low Risk  (01/07/2024)    Pedro Kemp, MSW, LCSW Clinical Social Worker II [facility] Heart/Vascular Care Navigation  [phone]- work cell phone (preferred) [phone]- desk phone

## 2024-01-10 NOTE — Transitions of Care (Post Inpatient/ED Visit) (Signed)
   01/10/2024  Name: Pedro Kemp MRN: [id number] DOB: 05/05/1956  Today's TOC FU Call Status: Today's TOC FU Call Status:: Unsuccessful Call (1st Attempt) Unsuccessful Call (1st Attempt) Date: 01/10/24  Attempted to reach the patient regarding the most recent Inpatient/ED visit.  Follow Up Plan: Additional outreach attempts will be made to reach the patient to complete the Transitions of Care (Post Inpatient/ED visit) call.   Signature  Robyne Peers, RN

## 2024-01-11 ENCOUNTER — Telehealth: Payer: Self-pay

## 2024-01-11 NOTE — Transitions of Care (Post Inpatient/ED Visit) (Signed)
   01/11/2024  Name: Pedro Kemp MRN: [id number] DOB: September 06, 1956  Today's TOC FU Call Status: Today's TOC FU Call Status:: Unsuccessful Call (2nd Attempt) Unsuccessful Call (1st Attempt) Date: 01/10/24 Unsuccessful Call (2nd Attempt) Date: 01/11/24  Attempted to reach the patient regarding the most recent Inpatient/ED visit.  Follow Up Plan: Additional outreach attempts will be made to reach the patient to complete the Transitions of Care (Post Inpatient/ED visit) call.   Signature  Robyne Peers, RN

## 2024-01-12 ENCOUNTER — Telehealth: Payer: Self-pay

## 2024-01-12 ENCOUNTER — Telehealth: Payer: Self-pay | Admitting: Licensed Clinical Social Worker

## 2024-01-12 NOTE — Telephone Encounter (Signed)
 H&V Care Navigation CSW Progress Note  Clinical Social Worker contacted patient by phone to fu on referral and medication assistance. No answer x2 again today at [phone]. Left voicemail with my information and reminder for upcoming appt with assistance of Spanish language interpreter Pieter Partridge [phone]. Will re-attempt pt closer to appt for additional assistance/try and ensure pt attendance at appt.  Patient is participating in a Managed Medicaid Plan:  No, self pay only  SDOH Screenings   Food Insecurity: Food Insecurity Present (01/07/2024)  Housing: Low Risk  (01/07/2024)  Transportation Needs: Unmet Transportation Needs (01/07/2024)  Utilities: Not At Risk (01/07/2024)  Alcohol Screen: Low Risk  (08/25/2023)  Depression (PHQ2-9): Low Risk  (11/11/2022)  Financial Resource Strain: High Risk (08/25/2023)  Social Connections: Socially Isolated (01/07/2024)  Tobacco Use: Low Risk  (01/07/2024)   Octavio Graves, MSW, LCSW Clinical Social Worker II [facility] Heart/Vascular Care Navigation  [phone]- work cell phone (preferred) [phone]- desk phone

## 2024-01-12 NOTE — Transitions of Care (Post Inpatient/ED Visit) (Signed)
   01/12/2024  Name: Pedro Kemp MRN: [id number] DOB: 12-15-1955  Today's TOC FU Call Status: Today's TOC FU Call Status:: Unsuccessful Call (3rd Attempt) Unsuccessful Call (1st Attempt) Date: 01/10/24 Unsuccessful Call (2nd Attempt) Date: 01/11/24 Unsuccessful Call (3rd Attempt) Date: 01/12/24  Attempted to reach the patient regarding the most recent Inpatient/ED visit.  Follow Up Plan: No further outreach attempts will be made at this time. We have been unable to contact the patient.  Letter sent to patient requesting he call the clinic to schedule an appointment as we have not been able to reach him.   Signature  Robyne Peers, RN

## 2024-01-18 ENCOUNTER — Telehealth (HOSPITAL_COMMUNITY): Payer: Self-pay | Admitting: *Deleted

## 2024-01-18 NOTE — Progress Notes (Signed)
 Heart Failure Nurse Navigator Progress Note  PCP: Pedro Sessions, NP PCP-Cardiologist: Branch Admission Diagnosis: Acute on chronic congestive heart failure.  Admitted from: Home  Presentation:   Pedro Kemp presented with feeling as if he can't catch his breath, ran out of his lasix and has not picked up his RX. BP 161/105, HR 91, BNP 497.3, EKG with no acute ischemia changes, CXR with mild interstitial edema.   Patient was admitted on a Friday morning thru the ED and discharged Saturday . HF Navigator unable to do interview or SDOH. Dr. Ella Kemp reached out over weekend to schedule a HF TOC appointment for patient. This was scheduled for 01/21/2024 @ 10:15 am. Patient is Spanish speaking, so Pedro Peon, NP called and spoke with Granddaughter and gave her all the appointment information for the patient to attend, Date/ time/ and location.   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  Tobacco Use   Smoking status: Never    Passive exposure: Never   Smokeless tobacco: Never  Vaping Use   Vaping status: Never Used  Substance and Sexual Activity   Alcohol use: Never   Drug use: Never   Sexual activity: Not Currently    Partners: Female  Other Topics Concern   Not on file  Social History Narrative   Not on file   Social Drivers of Health   Financial Resource Strain: High Risk (08/25/2023)   Overall Financial Resource Strain (CARDIA)    Difficulty of Paying  Living Expenses: Hard  Food Insecurity: Food Insecurity Present (01/07/2024)   Hunger Vital Sign    Worried About Running Out of Food in the Last Year: Sometimes true    Ran Out of Food in the Last Year: Sometimes true  Transportation Needs: Unmet Transportation Needs (01/07/2024)   PRAPARE - Administrator, Civil Service (Medical): Yes    Lack of Transportation (Non-Medical): Yes  Physical Activity: Not on file  Stress: Not on file  Social Connections: Socially Isolated (01/07/2024)   Social Connection and Isolation Panel [NHANES]    Frequency of Communication with Friends and Family: Twice a week    Frequency of Social Gatherings with Friends and Family: Never    Attends Religious Services: Never    Database administrator or Organizations: No    Attends Engineer, structural: Never    Marital Status: Never married    High Risk Criteria for Readmission and/or Poor Patient Outcomes: Heart failure hospital admissions (last 6 months): 1  No Show rate: 15 %  Difficult social situation: No  Demonstrates medication adherence: No, ran out of his lasix Primary Language: Spanish  Literacy level: Reading, writing, in spanish  Barriers of Care:   Medication compliance  Diet/ fluid restrictions Daily weights  Considerations/Referrals:   Referral made to Heart Failure Pharmacist Stewardship: NA, left next day over the weekend  Referral made to Heart Failure CSW/NCM TOC: NA Referral made to Heart & Vascular TOC clinic: Yes, HF TOC 01/21/2024 @ 10:15 am per Dr. Ella Kemp  Items for Follow-up on DC/TOC: HF education, patient in hospital 1 day and was discharged on weekend.  Diet/ fluid restrictions/ daily weights Needs updated SDOH Medication compliance, ran out of his lasix   Pedro Kemp, BSN, RN Heart Failure Print production planner Chat Only

## 2024-01-21 ENCOUNTER — Telehealth (HOSPITAL_COMMUNITY): Payer: Self-pay

## 2024-01-21 ENCOUNTER — Telehealth: Payer: Self-pay | Admitting: Licensed Clinical Social Worker

## 2024-01-21 NOTE — Telephone Encounter (Signed)
 Called to confirm/remind patient of their appointment at the [facility] on 01/24/2024 1:30.   Appointment:   [] Confirmed  [x] Left mess   [] No answer/No voice mail  [] Phone not in service  Patient reminded to bring all medications and/or complete list.  Confirmed patient has transportation. Gave directions, instructed to utilize valet parking.

## 2024-01-21 NOTE — Telephone Encounter (Signed)
 H&V Care Navigation CSW Progress Note  Clinical Social Worker contacted patient by phone to f/u regarding referral for financial concerns- pt has upcoming TOC appt Monday at HF clinic. No answer again today at [phone], left voicemail with assistance of Spanish language interpreter Dimas Millin [phone]. Reminded him of address, to bring all medications and proof of income to renew medication assistance. Pt has had several medication changes and all prior patient assistance has expired. Remain available as needed, will f/u with LCSW team Monday.  Patient is participating in a Managed Medicaid Plan:  No, self pay only  SDOH Screenings   Food Insecurity: Food Insecurity Present (01/07/2024)  Housing: Low Risk  (01/07/2024)  Transportation Needs: Unmet Transportation Needs (01/07/2024)  Utilities: Not At Risk (01/07/2024)  Alcohol Screen: Low Risk  (08/25/2023)  Depression (PHQ2-9): Low Risk  (11/11/2022)  Financial Resource Strain: High Risk (08/25/2023)  Social Connections: Socially Isolated (01/07/2024)  Tobacco Use: Low Risk  (01/07/2024)    Octavio Graves, MSW, LCSW Clinical Social Worker II [facility] Heart/Vascular Care Navigation  [phone]- work cell phone (preferred) [phone]- desk phone

## 2024-01-24 ENCOUNTER — Ambulatory Visit (HOSPITAL_COMMUNITY): Payer: Self-pay

## 2024-01-31 ENCOUNTER — Inpatient Hospital Stay (INDEPENDENT_AMBULATORY_CARE_PROVIDER_SITE_OTHER): Payer: Self-pay | Admitting: Primary Care

## 2024-02-10 ENCOUNTER — Ambulatory Visit: Payer: MEDICAID | Attending: Physician Assistant | Admitting: Physician Assistant

## 2024-02-10 NOTE — Progress Notes (Signed)
 This encounter was created in error - please disregard.

## 2024-02-18 ENCOUNTER — Ambulatory Visit: Payer: MEDICAID

## 2024-02-18 DIAGNOSIS — I255 Ischemic cardiomyopathy: Secondary | ICD-10-CM

## 2024-02-18 LAB — CUP PACEART REMOTE DEVICE CHECK
Battery Remaining Longevity: 111 mo
Battery Voltage: 2.99 V
Brady Statistic RV Percent Paced: 0.06 %
Date Time Interrogation Session: 20250501201737
HighPow Impedance: 59 Ohm
Implantable Lead Connection Status: 753985
Implantable Lead Connection Status: 753985
Implantable Lead Implant Date: 20220220
Implantable Lead Implant Date: 20220220
Implantable Lead Location: 753859
Implantable Lead Location: 753860
Implantable Lead Model: 5076
Implantable Pulse Generator Implant Date: 20220220
Lead Channel Impedance Value: 266 Ohm
Lead Channel Impedance Value: 361 Ohm
Lead Channel Impedance Value: 437 Ohm
Lead Channel Pacing Threshold Amplitude: 0.5 V
Lead Channel Pacing Threshold Amplitude: 0.625 V
Lead Channel Pacing Threshold Pulse Width: 0.4 ms
Lead Channel Pacing Threshold Pulse Width: 0.4 ms
Lead Channel Sensing Intrinsic Amplitude: 13.9 mV
Lead Channel Sensing Intrinsic Amplitude: 2 mV
Lead Channel Setting Pacing Amplitude: 1.5 V
Lead Channel Setting Pacing Amplitude: 2 V
Lead Channel Setting Pacing Pulse Width: 0.4 ms
Lead Channel Setting Sensing Sensitivity: 0.3 mV
Zone Setting Status: 755011
Zone Setting Status: 755011
Zone Setting Status: 755011

## 2024-02-25 ENCOUNTER — Other Ambulatory Visit: Payer: Self-pay

## 2024-02-25 ENCOUNTER — Other Ambulatory Visit (HOSPITAL_COMMUNITY): Payer: Self-pay | Admitting: Cardiology

## 2024-02-28 ENCOUNTER — Other Ambulatory Visit: Payer: Self-pay

## 2024-03-21 ENCOUNTER — Other Ambulatory Visit (INDEPENDENT_AMBULATORY_CARE_PROVIDER_SITE_OTHER): Payer: Self-pay | Admitting: Primary Care

## 2024-03-21 DIAGNOSIS — E119 Type 2 diabetes mellitus without complications: Secondary | ICD-10-CM

## 2024-03-21 NOTE — Telephone Encounter (Unsigned)
 Copied from CRM [id number]. Topic: Clinical - Medication Refill >> Mar 21, 2024  4:03 PM Yolanda T wrote: Medication: metFORMIN  (GLUCOPHAGE -XR) 500 MG 24 hr tablet and hydrALAZINE  (APRESOLINE ) injection 10 mg  Has the patient contacted their pharmacy? Yes Pharmacy stated no refills  This is the patient's preferred pharmacy:  [facility] [address] [place] [address] Phone: [phone] Fax: [phone]  Is this the correct pharmacy for this prescription? Yes  Has the prescription been filled recently? Yes  Is the patient out of the medication? Yes  Has the patient been seen for an appointment in the last year OR does the patient have an upcoming appointment? Yes  Can we respond through MyChart? No  Agent: Please be advised that Rx refills may take up to 3 business days. We ask that you follow-up with your pharmacy.

## 2024-03-23 ENCOUNTER — Other Ambulatory Visit: Payer: Self-pay

## 2024-03-30 NOTE — Progress Notes (Signed)
 Remote ICD transmission.

## 2024-04-03 ENCOUNTER — Other Ambulatory Visit (INDEPENDENT_AMBULATORY_CARE_PROVIDER_SITE_OTHER): Payer: Self-pay | Admitting: Primary Care

## 2024-04-03 ENCOUNTER — Other Ambulatory Visit: Payer: Self-pay

## 2024-04-03 DIAGNOSIS — E119 Type 2 diabetes mellitus without complications: Secondary | ICD-10-CM

## 2024-04-04 ENCOUNTER — Other Ambulatory Visit: Payer: Self-pay

## 2024-04-04 NOTE — Telephone Encounter (Signed)
 Copied from CRM [id number]. Topic: Clinical - Prescription Issue >> Apr 04, 2024  1:24 PM Fredrica W wrote: Reason for CRM: Patient called to check status of refill request Let them know appt is needed. Scheduled first available. Caller would like to know if any can be sent for diabetes because he is out. Thank You    ----------------------------------------------------------------------- From previous Reason for Contact - Scheduling: Patient/patient representative is calling to schedule an appointment. Refer to attachments for appointment information.

## 2024-04-13 ENCOUNTER — Emergency Department (HOSPITAL_COMMUNITY): Payer: MEDICAID

## 2024-04-13 ENCOUNTER — Other Ambulatory Visit: Payer: Self-pay

## 2024-04-13 ENCOUNTER — Encounter (HOSPITAL_COMMUNITY): Payer: Self-pay | Admitting: Emergency Medicine

## 2024-04-13 ENCOUNTER — Inpatient Hospital Stay (HOSPITAL_COMMUNITY)
Admission: EM | Admit: 2024-04-13 | Discharge: 2024-04-15 | DRG: 280 | Disposition: A | Discharge status: Home / Self Care | Payer: MEDICAID | Attending: Internal Medicine | Admitting: Internal Medicine

## 2024-04-13 ENCOUNTER — Inpatient Hospital Stay (HOSPITAL_COMMUNITY): Payer: MEDICAID

## 2024-04-13 DIAGNOSIS — Z7982 Long term (current) use of aspirin: Secondary | ICD-10-CM

## 2024-04-13 DIAGNOSIS — Z955 Presence of coronary angioplasty implant and graft: Secondary | ICD-10-CM | POA: Diagnosis not present

## 2024-04-13 DIAGNOSIS — I480 Paroxysmal atrial fibrillation: Secondary | ICD-10-CM | POA: Diagnosis present

## 2024-04-13 DIAGNOSIS — R7989 Other specified abnormal findings of blood chemistry: Secondary | ICD-10-CM

## 2024-04-13 DIAGNOSIS — N182 Chronic kidney disease, stage 2 (mild): Secondary | ICD-10-CM | POA: Diagnosis present

## 2024-04-13 DIAGNOSIS — Z7901 Long term (current) use of anticoagulants: Secondary | ICD-10-CM | POA: Diagnosis not present

## 2024-04-13 DIAGNOSIS — I2510 Atherosclerotic heart disease of native coronary artery without angina pectoris: Secondary | ICD-10-CM | POA: Diagnosis present

## 2024-04-13 DIAGNOSIS — I5023 Acute on chronic systolic (congestive) heart failure: Secondary | ICD-10-CM | POA: Diagnosis present

## 2024-04-13 DIAGNOSIS — Z91148 Patient's other noncompliance with medication regimen for other reason: Secondary | ICD-10-CM

## 2024-04-13 DIAGNOSIS — D696 Thrombocytopenia, unspecified: Secondary | ICD-10-CM | POA: Diagnosis present

## 2024-04-13 DIAGNOSIS — I252 Old myocardial infarction: Secondary | ICD-10-CM | POA: Diagnosis not present

## 2024-04-13 DIAGNOSIS — N289 Disorder of kidney and ureter, unspecified: Secondary | ICD-10-CM

## 2024-04-13 DIAGNOSIS — M79671 Pain in right foot: Secondary | ICD-10-CM | POA: Diagnosis present

## 2024-04-13 DIAGNOSIS — I509 Heart failure, unspecified: Secondary | ICD-10-CM

## 2024-04-13 DIAGNOSIS — E1165 Type 2 diabetes mellitus with hyperglycemia: Secondary | ICD-10-CM | POA: Diagnosis present

## 2024-04-13 DIAGNOSIS — Z8674 Personal history of sudden cardiac arrest: Secondary | ICD-10-CM

## 2024-04-13 DIAGNOSIS — Z79899 Other long term (current) drug therapy: Secondary | ICD-10-CM

## 2024-04-13 DIAGNOSIS — Z7984 Long term (current) use of oral hypoglycemic drugs: Secondary | ICD-10-CM

## 2024-04-13 DIAGNOSIS — I255 Ischemic cardiomyopathy: Secondary | ICD-10-CM | POA: Diagnosis present

## 2024-04-13 DIAGNOSIS — R7401 Elevation of levels of liver transaminase levels: Secondary | ICD-10-CM | POA: Diagnosis present

## 2024-04-13 DIAGNOSIS — E785 Hyperlipidemia, unspecified: Secondary | ICD-10-CM | POA: Diagnosis present

## 2024-04-13 DIAGNOSIS — I130 Hypertensive heart and chronic kidney disease with heart failure and stage 1 through stage 4 chronic kidney disease, or unspecified chronic kidney disease: Secondary | ICD-10-CM | POA: Diagnosis present

## 2024-04-13 DIAGNOSIS — Z95810 Presence of automatic (implantable) cardiac defibrillator: Secondary | ICD-10-CM | POA: Diagnosis not present

## 2024-04-13 DIAGNOSIS — E1169 Type 2 diabetes mellitus with other specified complication: Secondary | ICD-10-CM

## 2024-04-13 DIAGNOSIS — I2489 Other forms of acute ischemic heart disease: Secondary | ICD-10-CM | POA: Diagnosis present

## 2024-04-13 DIAGNOSIS — I214 Non-ST elevation (NSTEMI) myocardial infarction: Principal | ICD-10-CM | POA: Insufficient documentation

## 2024-04-13 DIAGNOSIS — E1122 Type 2 diabetes mellitus with diabetic chronic kidney disease: Secondary | ICD-10-CM | POA: Diagnosis present

## 2024-04-13 DIAGNOSIS — I21A1 Myocardial infarction type 2: Secondary | ICD-10-CM | POA: Diagnosis present

## 2024-04-13 DIAGNOSIS — E119 Type 2 diabetes mellitus without complications: Secondary | ICD-10-CM

## 2024-04-13 DIAGNOSIS — I469 Cardiac arrest, cause unspecified: Secondary | ICD-10-CM | POA: Diagnosis present

## 2024-04-13 LAB — CBC WITH DIFFERENTIAL/PLATELET
Abs Immature Granulocytes: 0.04 10*3/uL (ref 0.00–0.07)
Abs Immature Granulocytes: 0.03 10*3/uL (ref 0.00–0.07)
Basophils Absolute: 0.1 10*3/uL (ref 0.0–0.1)
Basophils Absolute: 0.1 10*3/uL (ref 0.0–0.1)
Basophils Relative: 1 %
Basophils Relative: 0 %
Eosinophils Absolute: 3.5 10*3/uL — ABNORMAL HIGH (ref 0.0–0.5)
Eosinophils Absolute: 1 10*3/uL — ABNORMAL HIGH (ref 0.0–0.5)
Eosinophils Relative: 24 %
Eosinophils Relative: 9 %
HCT: 41 % (ref 39.0–52.0)
HCT: 38.5 % — ABNORMAL LOW (ref 39.0–52.0)
Hemoglobin: 13.3 g/dL (ref 13.0–17.0)
Hemoglobin: 12.7 g/dL — ABNORMAL LOW (ref 13.0–17.0)
Immature Granulocytes: 0 %
Immature Granulocytes: 0 %
Lymphocytes Relative: 21 %
Lymphocytes Relative: 14 %
Lymphs Abs: 3.1 10*3/uL (ref 0.7–4.0)
Lymphs Abs: 1.5 10*3/uL (ref 0.7–4.0)
MCH: 30.4 pg (ref 26.0–34.0)
MCH: 30.1 pg (ref 26.0–34.0)
MCHC: 32.4 g/dL (ref 30.0–36.0)
MCHC: 33 g/dL (ref 30.0–36.0)
MCV: 93.6 fL (ref 80.0–100.0)
MCV: 91.2 fL (ref 80.0–100.0)
Monocytes Absolute: 0.4 10*3/uL (ref 0.1–1.0)
Monocytes Absolute: 0.4 10*3/uL (ref 0.1–1.0)
Monocytes Relative: 3 %
Monocytes Relative: 3 %
Neutro Abs: 7.7 10*3/uL (ref 1.7–7.7)
Neutro Abs: 8.2 10*3/uL — ABNORMAL HIGH (ref 1.7–7.7)
Neutrophils Relative %: 51 %
Neutrophils Relative %: 74 %
Platelets: 127 10*3/uL — ABNORMAL LOW (ref 150–400)
Platelets: 124 10*3/uL — ABNORMAL LOW (ref 150–400)
RBC: 4.38 MIL/uL (ref 4.22–5.81)
RBC: 4.22 MIL/uL (ref 4.22–5.81)
RDW: 13.6 % (ref 11.5–15.5)
RDW: 13.7 % (ref 11.5–15.5)
WBC: 14.8 10*3/uL — ABNORMAL HIGH (ref 4.0–10.5)
WBC: 11.2 10*3/uL — ABNORMAL HIGH (ref 4.0–10.5)
nRBC: 0 % (ref 0.0–0.2)
nRBC: 0 % (ref 0.0–0.2)

## 2024-04-13 LAB — ECHOCARDIOGRAM COMPLETE
AR max vel: 2.06 cm2
AV Area VTI: 1.92 cm2
AV Area mean vel: 1.9 cm2
AV Mean grad: 2 mmHg
AV Peak grad: 4.1 mmHg
Ao pk vel: 1.01 m/s
Area-P 1/2: 5.66 cm2
Calc EF: 25.1 %
MV M vel: 3.55 m/s
MV Peak grad: 50.4 mmHg
MV VTI: 1.13 cm2
S' Lateral: 5.3 cm
Single Plane A2C EF: 27 %
Single Plane A4C EF: 24.7 %

## 2024-04-13 LAB — HEPATIC FUNCTION PANEL
ALT: 48 U/L — ABNORMAL HIGH (ref 0–44)
AST: 45 U/L — ABNORMAL HIGH (ref 15–41)
Albumin: 3.5 g/dL (ref 3.5–5.0)
Alkaline Phosphatase: 69 U/L (ref 38–126)
Bilirubin, Direct: 0.1 mg/dL (ref 0.0–0.2)
Indirect Bilirubin: 0.9 mg/dL (ref 0.3–0.9)
Total Bilirubin: 1 mg/dL (ref 0.0–1.2)
Total Protein: 6.3 g/dL — ABNORMAL LOW (ref 6.5–8.1)

## 2024-04-13 LAB — COMPREHENSIVE METABOLIC PANEL WITH GFR
ALT: 56 U/L — ABNORMAL HIGH (ref 0–44)
AST: 52 U/L — ABNORMAL HIGH (ref 15–41)
Albumin: 3.8 g/dL (ref 3.5–5.0)
Alkaline Phosphatase: 88 U/L (ref 38–126)
Anion gap: 9 (ref 5–15)
BUN: 31 mg/dL — ABNORMAL HIGH (ref 8–23)
CO2: 20 mmol/L — ABNORMAL LOW (ref 22–32)
Calcium: 8.5 mg/dL — ABNORMAL LOW (ref 8.9–10.3)
Chloride: 110 mmol/L (ref 98–111)
Creatinine, Ser: 1.59 mg/dL — ABNORMAL HIGH (ref 0.61–1.24)
GFR, Estimated: 47 mL/min — ABNORMAL LOW (ref >60)
Glucose, Bld: 188 mg/dL — ABNORMAL HIGH (ref 70–99)
Potassium: 4.4 mmol/L (ref 3.5–5.1)
Sodium: 139 mmol/L (ref 135–145)
Total Bilirubin: 0.9 mg/dL (ref 0.0–1.2)
Total Protein: 6.8 g/dL (ref 6.5–8.1)

## 2024-04-13 LAB — APTT: aPTT: 34 s (ref 24–36)

## 2024-04-13 LAB — BASIC METABOLIC PANEL WITH GFR
Anion gap: 9 (ref 5–15)
BUN: 32 mg/dL — ABNORMAL HIGH (ref 8–23)
CO2: 20 mmol/L — ABNORMAL LOW (ref 22–32)
Calcium: 8.4 mg/dL — ABNORMAL LOW (ref 8.9–10.3)
Chloride: 110 mmol/L (ref 98–111)
Creatinine, Ser: 1.33 mg/dL — ABNORMAL HIGH (ref 0.61–1.24)
GFR, Estimated: 59 mL/min — ABNORMAL LOW (ref >60)
Glucose, Bld: 122 mg/dL — ABNORMAL HIGH (ref 70–99)
Potassium: 4.8 mmol/L (ref 3.5–5.1)
Sodium: 139 mmol/L (ref 135–145)

## 2024-04-13 LAB — GLUCOSE, CAPILLARY
Glucose-Capillary: 87 mg/dL (ref 70–99)
Glucose-Capillary: 166 mg/dL — ABNORMAL HIGH (ref 70–99)

## 2024-04-13 LAB — HEPARIN LEVEL (UNFRACTIONATED): Heparin Unfractionated: 0.59 [IU]/mL (ref 0.30–0.70)

## 2024-04-13 LAB — CBG MONITORING, ED
Glucose-Capillary: 150 mg/dL — ABNORMAL HIGH (ref 70–99)
Glucose-Capillary: 171 mg/dL — ABNORMAL HIGH (ref 70–99)
Glucose-Capillary: 94 mg/dL (ref 70–99)
Glucose-Capillary: 85 mg/dL (ref 70–99)

## 2024-04-13 LAB — BRAIN NATRIURETIC PEPTIDE: B Natriuretic Peptide: 647.5 pg/mL — ABNORMAL HIGH (ref 0.0–100.0)

## 2024-04-13 LAB — TROPONIN I (HIGH SENSITIVITY)
Troponin I (High Sensitivity): 3280 ng/L (ref <18)
Troponin I (High Sensitivity): 3494 ng/L (ref <18)

## 2024-04-13 LAB — MRSA NEXT GEN BY PCR, NASAL: MRSA by PCR Next Gen: NOT DETECTED

## 2024-04-13 LAB — PATHOLOGIST SMEAR REVIEW

## 2024-04-13 LAB — MAGNESIUM: Magnesium: 2.4 mg/dL (ref 1.7–2.4)

## 2024-04-13 MED ORDER — LOSARTAN POTASSIUM 50 MG PO TABS: 25.0000 mg | ORAL_TABLET | Freq: Every day | ORAL | Status: DC

## 2024-04-13 MED ORDER — FUROSEMIDE 10 MG/ML IJ SOLN
40.0000 mg | Freq: Once | INTRAMUSCULAR | Status: AC
  Administered 2024-04-13: 40 mg via INTRAVENOUS
  Filled 2024-04-13: qty 4

## 2024-04-13 MED ORDER — ASPIRIN 81 MG PO CHEW
324.0000 mg | CHEWABLE_TABLET | Freq: Once | ORAL | Status: AC
  Administered 2024-04-13: 324 mg via ORAL
  Filled 2024-04-13: qty 4

## 2024-04-13 MED ORDER — HEPARIN BOLUS VIA INFUSION
2000.0000 [IU] | Freq: Once | INTRAVENOUS | Status: AC
  Administered 2024-04-13: 2000 [IU] via INTRAVENOUS
  Filled 2024-04-13: qty 2000

## 2024-04-13 MED ORDER — INSULIN ASPART 100 UNIT/ML IJ SOLN
0.0000 [IU] | INTRAMUSCULAR | Status: DC
  Administered 2024-04-13 – 2024-04-14 (×3): 1 [IU] via SUBCUTANEOUS

## 2024-04-13 MED ORDER — ASPIRIN 81 MG PO TBEC
81.0000 mg | DELAYED_RELEASE_TABLET | Freq: Every day | ORAL | Status: DC
  Administered 2024-04-13 – 2024-04-14 (×2): 81 mg via ORAL
  Filled 2024-04-13 (×2): qty 1

## 2024-04-13 MED ORDER — LOSARTAN POTASSIUM 25 MG PO TABS
25.0000 mg | ORAL_TABLET | Freq: Every day | ORAL | Status: DC
  Administered 2024-04-13 – 2024-04-15 (×3): 25 mg via ORAL
  Filled 2024-04-13 (×3): qty 1

## 2024-04-13 MED ORDER — ALBUTEROL SULFATE (2.5 MG/3ML) 0.083% IN NEBU
2.5000 mg | INHALATION_SOLUTION | Freq: Once | RESPIRATORY_TRACT | Status: AC
  Administered 2024-04-13: 2.5 mg via RESPIRATORY_TRACT
  Filled 2024-04-13: qty 3

## 2024-04-13 MED ORDER — DAPAGLIFLOZIN PROPANEDIOL 10 MG PO TABS
10.0000 mg | ORAL_TABLET | Freq: Every day | ORAL | Status: DC
  Administered 2024-04-13 – 2024-04-15 (×3): 10 mg via ORAL
  Filled 2024-04-13 (×3): qty 1

## 2024-04-13 MED ORDER — ATORVASTATIN CALCIUM 80 MG PO TABS
80.0000 mg | ORAL_TABLET | Freq: Every day | ORAL | Status: DC
  Administered 2024-04-13 – 2024-04-15 (×3): 80 mg via ORAL
  Filled 2024-04-13: qty 2
  Filled 2024-04-13 (×2): qty 1

## 2024-04-13 MED ORDER — METOPROLOL SUCCINATE ER 25 MG PO TB24
25.0000 mg | ORAL_TABLET | Freq: Every day | ORAL | Status: DC
  Administered 2024-04-13 – 2024-04-15 (×3): 25 mg via ORAL
  Filled 2024-04-13 (×3): qty 1

## 2024-04-13 MED ORDER — HEPARIN (PORCINE) 25000 UT/250ML-% IV SOLN
1000.0000 [IU]/h | INTRAVENOUS | Status: DC
  Administered 2024-04-13: 900 [IU]/h via INTRAVENOUS
  Administered 2024-04-14: 1000 [IU]/h via INTRAVENOUS
  Filled 2024-04-13 (×3): qty 250

## 2024-04-13 NOTE — Progress Notes (Signed)
 PROGRESS NOTE    Pedro Kemp  FMW:[id number] DOB: 01-19-1956 DOA: 04/13/2024 PCP: Celestia Rosaline SQUIBB, NP   Brief Narrative: 68 year old past medical history of non-STEMI status post PCI to LAD at [facility], chronic heart failure reduced ejection fraction, history of VT arrest status post ICD placement, diabetes type 2, CKD stage II, A-fib presented to the ED complaining of shortness of breath.  Evaluation in the ED chest x-ray with effusion concerning for heart failure, rest IV Lasix , BNP 647, troponin 3200.  EKG sinus rhythm.  Cardiology has been consulted.  Patient was started on heparin  drip.    Assessment & Plan:   Principal Problem:   Non-STEMI (non-ST elevated myocardial infarction) (HCC) Active Problems:   Acute on chronic HFrEF (heart failure with reduced ejection fraction) (HCC)   Paroxysmal atrial fibrillation (HCC)   Type 2 diabetes mellitus with hyperglycemia, without long-term current use of insulin  (HCC)   history of VT arrest   History of cardiac arrest   ICD (implantable cardioverter-defibrillator) in place   Ischemic cardiomyopathy   1-non-STEMI - Patient presented with dyspnea found to have troponin 3000 range -Continue heparin  drip -Cardiology  consulted -Started on metoprolol   Acute on chronic heart failure reduced ejection fraction, history of VT arrest status post AICD: - Patient presented with shortness of breath, chest x-ray with pulmonary edema.  Received a dose of IV Lasix . -will hold further lasix  until cardiology evaluation in case patient require Cath.   CKD stage II -last GFR was at 60 -monitor renal function.  -cr baseline 1.3  Diabetes type 2: -hold metformin .  -SSI.  -he will need medications refill  Paroxysmal A-fib: -Continue with metoprolol .  -will need to resume when off heparin  gtt.   Leukocytosis;  Trending down.   Estimated body mass index is 26.88 kg/m as calculated from the following:   Height as of 01/07/24: 5' 4  (1.626 m).   Weight as of 01/08/24: 71 kg.   DVT prophylaxis: Heparin  gtt Code Status: Full code Family Communication: Sister who was at bedside Disposition Plan:  Status is: Inpatient Remains inpatient appropriate because: management of dyspnea, NSTEMI    Consultants:  Cardiology   Procedures:    Antimicrobials:    Subjective: He presents with worsening dyspnea. Worse day of admission. He ran out of few medications. He will need refill   Objective: Vitals:   04/13/24 0211 04/13/24 0624  BP: (!) 152/83   Pulse: 87   Resp: 20   Temp:  97.6 F (36.4 C)  TempSrc:  Oral  SpO2: 91%    No intake or output data in the 24 hours ending 04/13/24 0811 There were no vitals filed for this visit.  Examination:  General exam: Appears calm and comfortable  Respiratory system: Clear to auscultation. Respiratory effort normal. Cardiovascular system: S1 & S2 heard, RRR. No JVD, murmurs, rubs, gallops or clicks. No pedal edema. Gastrointestinal system: Abdomen is nondistended, soft and nontender. No organomegaly or masses felt. Normal bowel sounds heard. Central nervous system: Alert and oriented. No focal neurological deficits. Extremities: Symmetric 5 x 5 power.   Data Reviewed: I have personally reviewed following labs and imaging studies  CBC: Recent Labs  Lab 04/13/24 0222  WBC 14.8*  NEUTROABS 7.7  HGB 13.3  HCT 41.0  MCV 93.6  PLT 127*   Basic Metabolic Panel: Recent Labs  Lab 04/13/24 0222  NA 139  K 4.4  CL 110  CO2 20*  GLUCOSE 188*  BUN 31*  CREATININE 1.59*  CALCIUM  8.5*   GFR: CrCl cannot be calculated (Unknown ideal weight.). Liver Function Tests: Recent Labs  Lab 04/13/24 0222  AST 52*  ALT 56*  ALKPHOS 88  BILITOT 0.9  PROT 6.8  ALBUMIN 3.8   No results for input(s): LIPASE, AMYLASE in the last 168 hours. No results for input(s): AMMONIA in the last 168 hours. Coagulation Profile: No results for input(s): INR, PROTIME in  the last 168 hours. Cardiac Enzymes: No results for input(s): CKTOTAL, CKMB, CKMBINDEX, TROPONINI in the last 168 hours. BNP (last 3 results) No results for input(s): PROBNP in the last 8760 hours. HbA1C: No results for input(s): HGBA1C in the last 72 hours. CBG: Recent Labs  Lab 04/13/24 0653  GLUCAP 150*   Lipid Profile: No results for input(s): CHOL, HDL, LDLCALC, TRIG, CHOLHDL, LDLDIRECT in the last 72 hours. Thyroid  Function Tests: No results for input(s): TSH, T4TOTAL, FREET4, T3FREE, THYROIDAB in the last 72 hours. Anemia Panel: No results for input(s): VITAMINB12, FOLATE, FERRITIN, TIBC, IRON, RETICCTPCT in the last 72 hours. Sepsis Labs: No results for input(s): PROCALCITON, LATICACIDVEN in the last 168 hours.  No results found for this or any previous visit (from the past 240 hours).       Radiology Studies: DG Chest Portable 1 View Result Date: 04/13/2024 CLINICAL DATA:  Shortness of breath EXAM: PORTABLE CHEST 1 VIEW COMPARISON:  01/07/2024 FINDINGS: Cardiac shadow is stable. Defibrillator is again seen. Increased central vascular congestion is noted with mild interstitial edema. No effusions are noted. No bony is seen. IMPRESSION: Changes consistent with CHF with interstitial edema. Electronically Signed   By: Oneil Devonshire M.D.   On: 04/13/2024 03:07        Scheduled Meds:  aspirin  EC  81 mg Oral Daily   atorvastatin   80 mg Oral Daily   dapagliflozin  propanediol  10 mg Oral QAC breakfast   insulin  aspart  0-6 Units Subcutaneous Q4H   losartan   25 mg Oral Daily   metoprolol  succinate  25 mg Oral Daily   Continuous Infusions:  heparin  900 Units/hr (04/13/24 0554)     LOS: 0 days    Time spent: 35 minutes    Kenyada Dosch A Lonzy Mato, MD [facility]   If 7PM-7AM, please contact night-coverage [email or web address]  04/13/2024, 8:11 AM

## 2024-04-13 NOTE — ED Notes (Signed)
 Interpreter at bedside.

## 2024-04-13 NOTE — Progress Notes (Signed)
 ANTICOAGULATION CONSULT NOTE  Pharmacy Consult for Heparin  Indication: atrial fibrillation  No Known Allergies  Patient Measurements:   Vital Signs: Temp: 98.4 F (36.9 C) (06/26 1057) Temp Source: Oral (06/26 1057) BP: 127/72 (06/26 1245) Pulse Rate: 67 (06/26 1245)  Labs: Recent Labs    04/13/24 0222 04/13/24 0415 04/13/24 1027 04/13/24 1330 04/13/24 1340  HGB 13.3  --  12.7*  --   --   HCT 41.0  --  38.5*  --   --   PLT 127*  --  124*  --   --   APTT  --   --   --   --  34  HEPARINUNFRC  --   --   --  0.59  --   CREATININE 1.59*  --  1.33*  --   --   TROPONINIHS 3,280* 3,494*  --   --   --     CrCl cannot be calculated (Unknown ideal weight.).   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    Medications:  (Not in a hospital admission)  Scheduled:   aspirin  EC  81 mg Oral Daily   atorvastatin   80 mg Oral Daily   dapagliflozin  propanediol  10 mg Oral QAC breakfast   insulin  aspart  0-6 Units Subcutaneous Q4H   losartan   25 mg Oral Daily   metoprolol  succinate  25 mg Oral Daily   Infusions:   heparin  900 Units/hr (04/13/24 0554)   PRN:   Assessment: 68 y/o M with elevated troponin, on DOAC PTA for afib, was on apixaban  prior to hospital admission in march, possibly changed to Xarelto . Pt states to RN last dose 6/25 around 0600. Some concern with compliance, reports to EDP skipping some doses to make meds last.  Heparin  level is 0.59 but aPTT is 34. Suspect falsely high anti-Xa level secondary to DOAC use. Will use aPTT for monitoring for now.  Started on 900 units/hr IV heparin .   No notable issues with infusion or with bleeding per RN. Patient does keep bending arm causing occlusion error on pump per  RN.  Hgb 13.3>12.7; plt 124  Goal of Therapy:  Heparin  level 0.3-0.7 units/ml aPTT 66-102 seconds Monitor platelets by anticoagulation protocol: Yes   Plan:  Give 2000 units IV Heparin  bolus Increase heparin  infusion to 1100 units/hr Check aPTT & anti-Xa level at 8 and daily while on heparin  Continue to monitor via aPTT until levels are correlated Continue to monitor H&H and platelets  Dorn Buttner, PharmD, BCPS 04/13/2024 2:34 PM ED Clinical Pharmacist -  [phone]

## 2024-04-13 NOTE — Progress Notes (Signed)
 PHARMACY - ANTICOAGULATION CONSULT NOTE  Pharmacy Consult for Heparin   Indication: atrial fibrillation  No Known Allergies  Patient Measurements:    Vital Signs: BP: 152/83 (06/26 0211) Pulse Rate: 87 (06/26 0211)  Labs: Recent Labs    04/13/24 0222 04/13/24 0415  HGB 13.3  --   HCT 41.0  --   PLT 127*  --   CREATININE 1.59*  --   TROPONINIHS 3,280* 3,494*    CrCl cannot be calculated (Unknown ideal weight.).   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     Assessment: 68 y/o M with elevated troponin, on DOAC PTA for afib, was on apixaban  prior to hospital admission in march, possibly changed to Xarelto . Pt states to RN last dose 6/25 around 0600. Some concern with compliance, reports to EDP skipping some doses to make meds last. If compliant, anticipate using aPTT to dose.   Goal of Therapy:  Heparin  level 0.3-0.7 units/ml aPTT 66-102 secs Monitor platelets by anticoagulation protocol: Yes   Plan:  Start heparin  drip at 900 units/hr Heparin  level and aPTT in 8 hours Daily CBC, heparin  level, aPTT Monitor for bleeding  Lynwood Mckusick, PharmD, BCPS Clinical Pharmacist Phone: [phone]

## 2024-04-13 NOTE — ED Provider Notes (Signed)
 [facility] AT [facility] Provider Note   CSN: [id number] Arrival date & time: 04/13/24  0208     Patient presents with: Shortness of Breath (Chest Tightness)   Pedro Kemp is a 68 y.o. male.   The history is provided by the patient. A language interpreter was used Karl [phone]).  Shortness of Breath He has history of diabetes, heart failure, coronary artery disease, paroxysmal atrial fibrillation anticoagulated on apixaban , and comes in because he woke up short of breath at 1 AM.  He did have some chest discomfort, but he has difficulty describing it.  This felt the same as it has felt in the past when he has had fluid buildup in his lungs.  He denies cough, nausea, vomiting, diaphoresis.  He says that he has been getting low on his medications and one of them ran out about 10 days ago.  The other medications, he has been skipping doses to try to make the medications last longer.  It has been very difficult to get from him what medications he has been taking regularly and which medications he has stopped of which medications he is taking intermittently.  As a separate complaint, he has been having pain in his right foot for the last 3 days.   Prior to Admission medications   Medication Sig Start Date End Date Taking? Authorizing Provider  atorvastatin  (LIPITOR ) 80 MG tablet Take 1 tablet (80 mg total) by mouth daily. 01/09/24 02/08/24  Arrien, Mauricio Daniel, MD  dapagliflozin  propanediol (FARXIGA ) 10 MG TABS tablet Take 1 tablet (10 mg total) by mouth daily before breakfast. 01/09/24   Arrien, Elidia Sieving, MD  furosemide  (LASIX ) 40 MG tablet Take 1 tablet (40 mg total) by mouth 2 (two) times daily. 01/08/24   Arrien, Elidia Sieving, MD  losartan  (COZAAR ) 25 MG tablet Take 1 tablet (25 mg total) by mouth daily. 01/09/24 02/08/24  Arrien, Mauricio Daniel, MD  metFORMIN  (GLUCOPHAGE -XR) 500 MG 24 hr tablet Take 1 tablet (500 mg total) by mouth 2 (two)  times daily with a meal. 01/08/24 02/07/24  Arrien, Elidia Sieving, MD  metoprolol  succinate (TOPROL -XL) 25 MG 24 hr tablet Take 1 tablet (25 mg total) by mouth daily. 01/09/24 02/08/24  Arrien, Elidia Sieving, MD  rivaroxaban  (XARELTO ) 20 MG TABS tablet Take 1 tablet (20 mg total) by mouth daily wtih food. 01/08/24   Arrien, Elidia Sieving, MD    Allergies: Patient has no known allergies.    Review of Systems  Respiratory:  Positive for shortness of breath.   All other systems reviewed and are negative.   Updated Vital Signs BP (!) 152/83 (BP Location: Right Arm)   Pulse 87   Resp 20   SpO2 91%   Physical Exam Vitals and nursing note reviewed.   68 year old male, resting comfortably and in no acute distress. Vital signs are significant for elevated blood pressure. Oxygen saturation is 91%, which is normal. Head is normocephalic and atraumatic. PERRLA, EOMI.  Neck is nontender and supple without adenopathy or JVD. Lungs have rales about halfway up.  There are no wheezes or rhonchi. Chest is nontender. Heart has regular rate and rhythm without murmur. Abdomen is soft, flat, nontender. Extremities: Right transmetatarsal amputation.  There is 2+ pitting edema which is greater on the right than on the left.  There is a shallow ulcer on the dorsum of the right foot without erythema or warmth or drainage.  Mild venous stasis changes are present bilaterally. Skin  is warm and dry without other rash. Neurologic: Awake and alert, oriented x 3, cranial nerves are intact.  Moves all extremities equally.  Sensation is intact in all 4 extremities, no decrease in distal sensation.  (all labs ordered are listed, but only abnormal results are displayed) Labs Reviewed  CBC WITH DIFFERENTIAL/PLATELET - Abnormal; Notable for the following components:      Result Value   WBC 14.8 (*)    Platelets 127 (*)    Eosinophils Absolute 3.5 (*)    All other components within normal limits  COMPREHENSIVE  METABOLIC PANEL WITH GFR  BRAIN NATRIURETIC PEPTIDE  TROPONIN I (HIGH SENSITIVITY)    EKG: EKG Interpretation Date/Time:  Thursday April 13 2024 02:17:16 EDT Ventricular Rate:  79 PR Interval:  164 QRS Duration:  118 QT Interval:  368 QTC Calculation: 421 R Axis:   -21  Text Interpretation: Normal sinus rhythm Left ventricular hypertrophy with QRS widening ( R in aVL , Cornell product ) ST & T wave abnormality, consider lateral ischemia Abnormal ECG When compared with ECG of 07-Jan-2024 01:45, No significant change was found Confirmed by Raford Lenis ([id number]) on 04/13/2024 3:07:04 AM   EKG Interpretation Date/Time:  Thursday April 13 2024 03:48:22 EDT Ventricular Rate:  76 PR Interval:  164 QRS Duration:  122 QT Interval:  403 QTC Calculation: 454 R Axis:   -37  Text Interpretation: Sinus rhythm Probable left atrial enlargement Nonspecific IVCD with LAD LVH with secondary repolarization abnormality Anterior ST elevation, probably due to LVH When compared with ECG of EARLIER SAME DATE No significant change was found Confirmed by Raford Lenis ([id number]) on 04/13/2024 5:38:48 AM        Radiology: DG Chest Portable 1 View Result Date: 04/13/2024 CLINICAL DATA:  Shortness of breath EXAM: PORTABLE CHEST 1 VIEW COMPARISON:  01/07/2024 FINDINGS: Cardiac shadow is stable. Defibrillator is again seen. Increased central vascular congestion is noted with mild interstitial edema. No effusions are noted. No bony is seen. IMPRESSION: Changes consistent with CHF with interstitial edema. Electronically Signed   By: Oneil Devonshire M.D.   On: 04/13/2024 03:07     Procedures  Cardiac monitor shows normal sinus rhythm, per my interpretation. Medications Ordered in the ED  aspirin  chewable tablet 324 mg (has no administration in time range)  heparin  ADULT infusion 100 units/mL (25000 units/250mL) (has no administration in time range)  albuterol  (PROVENTIL ) (2.5 MG/3ML) 0.083% nebulizer solution 2.5 mg  (2.5 mg Nebulization Given 04/13/24 0218)  furosemide  (LASIX ) injection 40 mg (40 mg Intravenous Given 04/13/24 0349)                                    Medical Decision Making Amount and/or Complexity of Data Reviewed Labs: ordered. Radiology: ordered.  Risk OTC drugs. Prescription drug management. Decision regarding hospitalization.   Exacerbation of heart failure likely secondary to medication noncompliance, known history of coronary artery disease but need to rule out recurrent MI.  Ulcer of the right foot which does not appeared to be infected.  In spite of using a translator, I am not certain that he does not actually have peripheral neuropathy.  I have reviewed his past records, and note hospitalization 01/07/2024-01/08/2024 for heart failure exacerbation.  He follow-up with cardiology on days, but did not go to that appointment.  There were multiple notes of attempts to reach him but did not get answer when he was phoned.  I am concerned that if I diurese him and discharge him he will continue to be lost to follow-up.  I am planning to admit him to try to make sure that appropriate follow-up is established.  Further review of past records shows heart catheterization on 08/24/2023 showing patent mid LAD and diagonal stents, high-grade ostial diagonal disease, high-grade mid left circumflex disease, moderate diffuse disease of the right coronary artery, elevated left ventricular end-diastolic pressure.  Echocardiogram on 08/24/2023 showed ejection fraction 30-35% with indeterminate diastolic parameters.  I have reviewed his electrocardiogram, and my interpretation is LVH with repolarization changes unchanged from prior.  Chest x-ray shows changes of heart failure with mild interstitial edema.  I have independently viewed the image, and agree with the radiologist's interpretation.  I have ordered intravenous furosemide .  I have reviewed his laboratory tests, my interpretation is elevated  creatinine which is slightly increased compared with most recent value, mild elevation of AST and ALT of uncertain clinical significance, significantly elevated BNP consistent with heart failure, markedly elevated troponin consistent with non-STEMI, mild leukocytosis which is nonspecific, mild thrombocytopenia which is unchanged from baseline.  Because of elevated troponin, I have given him aspirin  and started him on a heparin  infusion.  Repeat troponin has increased slightly.  I have discussed the case with Dr. Donnel of cardiology service who requests the patient be admitted to the hospitalist and states he will see the patient in consultation.  I discussed case with Dr. Franky of [facility], who agrees to admit the patient.  Repeat ECG was obtained showing no change from initial tracing.  CRITICAL CARE Performed by: Alm Lias Total critical care time: 55 minutes Critical care time was exclusive of separately billable procedures and treating other patients. Critical care was necessary to treat or prevent imminent or life-threatening deterioration. Critical care was time spent personally by me on the following activities: development of treatment plan with patient and/or surrogate as well as nursing, discussions with consultants, evaluation of patient's response to treatment, examination of patient, obtaining history from patient or surrogate, ordering and performing treatments and interventions, ordering and review of laboratory studies, ordering and review of radiographic studies, pulse oximetry and re-evaluation of patient's condition.     Final diagnoses:  Non-STEMI (non-ST elevated myocardial infarction) (HCC)  Acute exacerbation of chronic heart failure (HCC)  Renal insufficiency  Elevated transaminase level  Thrombocytopenia [facility])    ED Discharge Orders     None          Lias Alm, MD 04/13/24 [phone]

## 2024-04-13 NOTE — ED Triage Notes (Addendum)
 Patient reports SOB with chest tightness , productive cough and chest congestion this morning , denies fever or chills . History of CAD/Coronary Stent/CHF.

## 2024-04-13 NOTE — H&P (Signed)
 History and Physical    Pedro Kemp FMW:[id number] DOB: May 29, 1956 DOA: 04/13/2024  Patient coming from: Home.  Chief Complaint: Shortness of breath.  Engineer, structural used.  HPI: Pedro Kemp is a 68 y.o. male with history of non-ST elevation MI status post PCI to LAD at [facility] last heart cath on: No interventional targets medically managed with history of chronic HFrEF with history of VT arrest status post ICD placement, diabetes mellitus type 2, chronic kidney disease stage III, atrial fibrillation presents to the ER with complaints of shortness of breath.  Denies any chest pain productive cough or fever or chills.  Patient states he has ran out of his medicines for the last 1 week.  ED Course: In the ER chest x-ray was showing features concerning for CHF exacerbation and was given IV Lasix  40 mg.  Labs show BNP of 647 troponin of 3200 repeat one was 3400.  EKG shows normal sinus rhythm.  Cardiology was consulted and patient was started on heparin  infusion for non-ST elevation MI.  Review of Systems: As per HPI, rest all negative.   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    Past Surgical History:  Procedure Laterality Date   CARDIAC CATHETERIZATION     ICD IMPLANT     RIGHT/LEFT HEART CATH AND CORONARY ANGIOGRAPHY N/A 08/24/2023   Procedure: RIGHT/LEFT HEART CATH AND CORONARY ANGIOGRAPHY;  Surgeon: Wendel Lurena POUR, MD;  Location: [facility] INVASIVE CV LAB;  Service: Cardiovascular;  Laterality: N/A;     reports that he has never smoked. He has never been exposed to tobacco smoke. He has never used smokeless tobacco. He reports that he does not drink alcohol and does not use drugs.  No Known Allergies  Family History  Problem Relation  Age of Onset   Diabetes Mother    Diabetes Brother     Prior to Admission medications   Medication Sig Start Date End Date Taking? Authorizing Provider  atorvastatin  (LIPITOR ) 80 MG tablet Take 1 tablet (80 mg total) by mouth daily. 01/09/24 02/08/24  Arrien, Mauricio Daniel, MD  dapagliflozin  propanediol (FARXIGA ) 10 MG TABS tablet Take 1 tablet (10 mg total) by mouth daily before breakfast. 01/09/24   Arrien, Elidia Sieving, MD  furosemide  (LASIX ) 40 MG tablet Take 1 tablet (40 mg total) by mouth 2 (two) times daily. 01/08/24   Arrien, Elidia Sieving, MD  losartan  (COZAAR ) 25 MG tablet Take 1 tablet (25 mg total) by mouth daily. 01/09/24 02/08/24  Arrien, Mauricio Daniel, MD  metFORMIN  (GLUCOPHAGE -XR) 500 MG 24 hr tablet Take 1 tablet (500 mg total) by mouth 2 (two) times daily with a meal. 01/08/24 02/07/24  Arrien, Elidia Sieving, MD  metoprolol  succinate (TOPROL -XL) 25 MG 24 hr tablet Take 1 tablet (25 mg total) by mouth daily. 01/09/24 02/08/24  Arrien, Elidia Sieving, MD  rivaroxaban  (XARELTO ) 20 MG TABS tablet Take 1 tablet (20 mg total) by mouth daily wtih food. 01/08/24   Arrien, Elidia Sieving, MD    Physical Exam: Constitutional: Moderately built and nourished. Vitals:   04/13/24 0211 04/13/24 0624  BP: (!) 152/83   Pulse: 87   Resp: 20   Temp:  97.6 F (36.4 C)  TempSrc:  Oral  SpO2: 91%    Eyes: Anicteric no pallor. ENMT: No discharge from the ears eyes nose: Neck: No mass felt.  No neck rigidity. Respiratory: No rhonchi or crepitations. Cardiovascular: S1-S2  Abdomen: Soft nontender bowel sound present. Musculoskeletal: No edema. Skin: No rash. Neurologic: Alert awake oriented to time place and person.  Moves all extremities. Psychiatric: Appears normal.  Normal affect.   Labs on Admission: I have personally reviewed following labs and imaging studies  CBC: Recent Labs  Lab 04/13/24 0222  WBC 14.8*  NEUTROABS 7.7  HGB 13.3  HCT 41.0  MCV 93.6  PLT 127*    Basic Metabolic Panel: Recent Labs  Lab 04/13/24 0222  NA 139  K 4.4  CL 110  CO2 20*  GLUCOSE 188*  BUN 31*  CREATININE 1.59*  CALCIUM  8.5*   GFR: CrCl cannot be calculated (Unknown ideal weight.). Liver Function Tests: Recent Labs  Lab 04/13/24 0222  AST 52*  ALT 56*  ALKPHOS 88  BILITOT 0.9  PROT 6.8  ALBUMIN 3.8   No results for input(s): LIPASE, AMYLASE in the last 168 hours. No results for input(s): AMMONIA in the last 168 hours. Coagulation Profile: No results for input(s): INR, PROTIME in the last 168 hours. Cardiac Enzymes: No results for input(s): CKTOTAL, CKMB, CKMBINDEX, TROPONINI in the last 168 hours. BNP (last 3 results) No results for input(s): PROBNP in the last 8760 hours. HbA1C: No results for input(s): HGBA1C in the last 72 hours. CBG: No results for input(s): GLUCAP in the last 168 hours. Lipid Profile: No results for input(s): CHOL, HDL, LDLCALC, TRIG, CHOLHDL, LDLDIRECT in the last 72 hours. Thyroid  Function Tests: No results for input(s): TSH, T4TOTAL, FREET4, T3FREE, THYROIDAB in the last 72 hours. Anemia Panel: No results for input(s): VITAMINB12, FOLATE, FERRITIN, TIBC, IRON, RETICCTPCT in the last 72 hours. Urine analysis: No results found for: COLORURINE, APPEARANCEUR, LABSPEC, PHURINE, GLUCOSEU, HGBUR, BILIRUBINUR, KETONESUR, PROTEINUR, UROBILINOGEN, NITRITE, LEUKOCYTESUR Sepsis Labs: @LABRCNTIP (procalcitonin:4,lacticidven:4) )No results found for this or any previous visit (from the past 240 hours).   Radiological Exams on Admission: DG Chest Portable 1 View Result Date: 04/13/2024 CLINICAL DATA:  Shortness of breath EXAM: PORTABLE CHEST 1 VIEW COMPARISON:  01/07/2024 FINDINGS: Cardiac shadow is stable. Defibrillator is again seen. Increased central vascular congestion is noted with mild interstitial edema. No effusions are noted. No bony is seen.  IMPRESSION: Changes consistent with CHF with interstitial edema. Electronically Signed   By: Oneil Devonshire M.D.   On: 04/13/2024 03:07    EKG: Independently reviewed.  Normal sinus rhythm.  Assessment/Plan Principal Problem:   Non-STEMI (non-ST elevated myocardial infarction) (HCC) Active Problems:   Acute on chronic HFrEF (heart failure with reduced ejection fraction) (HCC)   Paroxysmal atrial fibrillation (HCC)   Type 2 diabetes mellitus with hyperglycemia, without long-term current use of insulin  (HCC)   history of VT arrest   History of cardiac arrest   ICD (implantable cardioverter-defibrillator) in place   Ischemic cardiomyopathy    Non-ST elevation MI -      patient denies any chest pain.  Patient has been started on heparin  infusion.  Continue with aspirin  statin beta-blockers.  Keep patient NPO.  Cardiology has been consulted.  Patient has had prior history of non-ST MI status post PCI to LAD in [facility].  Last cardiac cath on November 2024 at [facility] showed no intervenable targets. Acute on chronic HFrEF with history of VT arrest status post AICD patient received 1 dose of IV Lasix .  Further doses to be based on response.  Will try to restart patient's GDMT  since patient has been off for last 1 week.  Last EF measured was 30 to 35% on November 2024. Chronic kidney disease stage III creatinine at around baseline. Diabetes mellitus type 2 on Jardiance .  Last hemoglobin A1c was 7.53 months ago. Paroxysmal atrial fibrillation presently rate controlled.  Presently on heparin  and Coreg.  Since patient has NON STMI and CHF exacerbation will need more than two midnight stay.   DVT prophylaxis: Heparin  infusion. Code Status: Full code. Family Communication: Discussed with patient. Disposition Plan: Cardiac telemetry.  Consults called: Cardiology.  Admission status: Inpatient.

## 2024-04-13 NOTE — Progress Notes (Signed)
*  PRELIMINARY RESULTS* Echocardiogram 2D Echocardiogram has been performed.  Benard FORBES Stallion 04/13/2024, 2:10 PM

## 2024-04-13 NOTE — ED Notes (Signed)
 RN notified that the patient's BP dropped to 84/51 with a map of 65.

## 2024-04-13 NOTE — Consult Note (Addendum)
 Cardiology Consultation   Patient ID: Aris Even MRN: [id number]; DOB: September 08, 1956  Admit date: 04/13/2024 Date of Consult: 04/13/2024  PCP:  Celestia Rosaline SQUIBB, NP   [facility] Providers Cardiologist:  previously Dr. Ronal Ross, Dr. Francyne followed ICD  Patient Profile: Gareth Fitzner is a 68 y.o. male with a hx of ischemic cardiomyopathy with reduced EF, s/p MedTronic ICD post VT arrest in 2022, paroxsymal atrial fibrillation on Xarelto , CAD s/p DES to LAD, D1 (04/2021 at [facility]), hypertension, type 2 diabetes, MR who is being seen 04/13/2024 for the evaluation of NSTEMI at the request of Dr. Franky.  History of Present Illness: Mr. Freeney has past medical history as stated above. He presented to [facility] ED on 04/13/2024 complaining of shortness of breath. He reports that he woke up in the middle of the night feeling short of breath. He reported some nonspecific chest discomfort. He describes it as the same as when he's had fluid in his lungs before. He states that he ran out of his medications about 10 days ago and has been skipping doses in an attempt to make the medications last longer.   Relevant workup in the ED/since admission includes: CBC showed leukocytosis which appears chronic, creatinine 1.59 > 1.33 (noted to be 1.25 three months ago), BNP 647, troponin level 3,280 > 3,494. CXR showed CHF with interstitial edema. EKG showed sinus rhythm, HR 76 unchanged from prior.   He used to follow up with [facility] cardiology in [place] and then transitioned to [facility] 08/2022. He was recently seen in the inpatient setting 12/2023 for a nearly identical situation. He presented awoken from sleep being short of breath. At this time he was also out of his medications for about one week. During this admission he was given IV Lasix  and showed improvement, he was supposed to follow up with cardiology as outpatient and never showed up. During this admission he had also not  experienced any active chest pain, troponin levels were slightly elevated but due to recent cath without target for PCI it was deferred.   After speaking with patient using Spanish interpreter ID # [id number], after being disconnected from several different translators. Patient reports having shortness of breath that started last night, around 1 AM, that did not occur prior to yesterday. Denied any LE edema, or any prior or current chest pain. He is positive that he has not had chest pain previously or actively. He states that this feels very similar to when he was in March in regards to his symptoms. He states that he has been doing his normal daily activities, still at work, until just last night. He tells me that his medications have been free in the past but then they became more expensive which was a barrier to him being able to get them along with going to the pharmacy and they did not have them at the pharmacy, even with using the translator it is not super clear if they didn't have them or he didn't have a Rx for them because of missed appointments.   Past Medical History:  Diagnosis Date   Cardiac arrest [facility])    CHF (congestive heart failure) (HCC)    Coronary artery disease    Diabetes mellitus without complication (HCC)    H/O heart artery stent    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   Past Surgical History:  Procedure Laterality Date   CARDIAC CATHETERIZATION     ICD  IMPLANT     RIGHT/LEFT HEART CATH AND CORONARY ANGIOGRAPHY N/A 08/24/2023   Procedure: RIGHT/LEFT HEART CATH AND CORONARY ANGIOGRAPHY;  Surgeon: Wendel Lurena POUR, MD;  Location: [facility] INVASIVE CV LAB;  Service: Cardiovascular;  Laterality: N/A;    Home Medications:  Prior to Admission medications   Medication Sig Start Date End Date Taking? Authorizing Provider  atorvastatin  (LIPITOR ) 80 MG tablet Take 1 tablet (80 mg total) by mouth daily. 01/09/24  Yes Arrien, Elidia Sieving, MD   dapagliflozin  propanediol (FARXIGA ) 10 MG TABS tablet Take 1 tablet (10 mg total) by mouth daily before breakfast. 01/09/24  Yes Arrien, Elidia Sieving, MD  furosemide  (LASIX ) 40 MG tablet Take 1 tablet (40 mg total) by mouth 2 (two) times daily. 01/08/24  Yes Arrien, Elidia Sieving, MD  losartan  (COZAAR ) 25 MG tablet Take 1 tablet (25 mg total) by mouth daily. 01/09/24 04/13/24 Yes Arrien, Mauricio Daniel, MD  metFORMIN  (GLUCOPHAGE -XR) 500 MG 24 hr tablet Take 1 tablet (500 mg total) by mouth 2 (two) times daily with a meal. 01/08/24 04/13/24 Yes Arrien, Elidia Sieving, MD  metoprolol  succinate (TOPROL -XL) 25 MG 24 hr tablet Take 1 tablet (25 mg total) by mouth daily. 01/09/24 04/13/24 Yes Arrien, Elidia Sieving, MD  rivaroxaban  (XARELTO ) 20 MG TABS tablet Take 1 tablet (20 mg total) by mouth daily wtih food. 01/08/24  Yes Arrien, Elidia Sieving, MD   Scheduled Meds:  aspirin  EC  81 mg Oral Daily   atorvastatin   80 mg Oral Daily   dapagliflozin  propanediol  10 mg Oral QAC breakfast   insulin  aspart  0-6 Units Subcutaneous Q4H   metoprolol  succinate  25 mg Oral Daily   Continuous Infusions:  heparin  900 Units/hr (04/13/24 0554)   PRN Meds:  Allergies:   No Known Allergies  Social History:   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  Tobacco Use   Smoking status: Never    Passive exposure: Never   Smokeless tobacco: Never  Vaping Use   Vaping status: Never Used  Substance and Sexual Activity   Alcohol use: Never   Drug use: Never   Sexual activity: Not Currently    Partners: Female  Other Topics Concern   Not on file  Social History Narrative   Not on file   Social Drivers of Health   Financial Resource Strain: High Risk (08/25/2023)   Overall Financial Resource Strain (CARDIA)     Difficulty of Paying Living Expenses: Hard  Food Insecurity: Food Insecurity Present (01/07/2024)   Hunger Vital Sign    Worried About Running Out of Food in the Last Year: Sometimes true    Ran Out of Food in the Last Year: Sometimes true  Transportation Needs: Unmet Transportation Needs (01/07/2024)   PRAPARE - Administrator, Civil Service (Medical): Yes    Lack of Transportation (Non-Medical): Yes  Physical Activity: Not on file  Stress: Not on file  Social Connections: Socially Isolated (01/07/2024)   Social Connection and Isolation Panel    Frequency of Communication with Friends and Family: Twice a week    Frequency of Social Gatherings with Friends and Family: Never    Attends Religious Services: Never    Database administrator or Organizations: No    Attends  Club or Organization Meetings: Never    Marital Status: Never married  Intimate Partner Violence: Not At Risk (01/07/2024)   Humiliation, Afraid, Rape, and Kick questionnaire    Fear of Current or Ex-Partner: No    Emotionally Abused: No    Physically Abused: No    Sexually Abused: No    Family History:   Family History  Problem Relation Age of Onset   Diabetes Mother    Diabetes Brother     ROS:  Please see the history of present illness.  All other ROS reviewed and negative.     Physical Exam/Data: Vitals:   04/13/24 1115 04/13/24 1130 04/13/24 1145 04/13/24 1216  BP: 118/62 98/72 116/68 107/75  Pulse: 60 61 61 63  Resp: 15 11 18 17   Temp:      TempSrc:      SpO2: 100% 99% 100% 100%   No intake or output data in the 24 hours ending 04/13/24 1243    01/08/2024    4:33 AM 01/07/2024    7:43 PM 01/05/2024    3:23 PM  Last 3 Weights  Weight (lbs) 156 lb 9.6 oz 149 lb 3.2 oz 156 lb  Weight (kg) 71.033 kg 67.677 kg 70.761 kg     There is no height or weight on file to calculate BMI.   General:  in no acute distress, on [place] HEENT: normal Neck: no JVD Vascular: Distal pulses 2+  bilaterally Cardiac:  normal S1, S2; RRR; no murmur  Lungs:  decreased breath sounds at bases   Abd: soft, nontender, no hepatomegaly  Ext: Right transmetatarsal amputation, minimal LE edema Musculoskeletal:  No deformities, BUE and BLE strength normal and equal Skin: warm and dry  Neuro:  no focal abnormalities noted Psych:  Normal affect   Telemetry:  Telemetry was personally reviewed and demonstrates:  sinus rhythm, HR 60s  Relevant CV Studies: RHC/LHC, 08/24/2023   Prox RCA lesion is 60% stenosed.   Mid RCA lesion is 50% stenosed.   Dist RCA lesion is 80% stenosed.   Mid LM to Dist LM lesion is 20% stenosed.   Prox Cx to Mid Cx lesion is 80% stenosed.   2nd Mrg lesion is 99% stenosed.   1st Diag-2 lesion is 5% stenosed.   1st Diag-1 lesion is 80% stenosed.   RPDA lesion is 50% stenosed.   Previously placed Mid LAD stent of unknown type is  widely patent.   1.  Patent mid LAD and diagonal stents. 2.  High-grade ostial diagonal disease. 3.  High-grade mid left circumflex disease; this is a very small vessel and feeds a subtotally occluded obtuse marginal. 4.  Moderate diffuse disease of the right coronary artery with an 80% lesion of the distal RCA. 5.  Fick cardiac output of 3.6 L/min and Fick cardiac index of 2.0 L/min/m with the following hemodynamics:             RA pressure mean 8 mmHg             RV pressure 37/-2 with an end-diastolic pressure of 11 mmHg             Wedge pressure mean of 17 mmHg             PA pressure 36/15 with a mean of 26 mmHg             PVR of 2.3 [place] units             PA pulsatility  index of 2.6 6.  LVEDP of 27 to 30 mmHg   Recommendation: The images were reviewed with Dr. Elmira.  There does not seem to be a clear culprit lesion.  At this point in time we will treat medically.  Echocardiogram, 08/24/2023 No obvious LV thrombus on images obtained, would recommend limited contrast study to exclude thrombus. Left ventricular ejection  fraction, by estimation, is 30 to 35% . The left ventricle has moderately decreased function. The left ventricle demonstrates regional wall motion abnormalities ( see scoring diagram/ findings for description) . There is mild asymmetric left ventricular hypertrophy of the basal- septal segment. Indeterminate diastolic filling due to E- A fusion.  Right ventricular systolic function is normal. The right ventricular size is normal. Left atrial size was mildly dilated.  The mitral valve is grossly normal. Moderate mitral valve regurgitation. No evidence of mitral stenosis.  The aortic valve is tricuspid. Aortic valve regurgitation is not visualized. No aortic stenosis is present.  Comparison(s) : No significant change from prior study. WMA was present on prior study.  Device check, 02/17/2024 Presenting rhythm is A paced V sensed.  Notpacemaker dependent.  Battery status is good.  Normal device function  Lead measurements (capture threshold, sensing and impedance) are stable.  Heart rate histogram is favorable.  No clinically significant episodes of high ventricular rate. Stable low prevalence of paroxysmal atrial fibrillation (<1%).   Laboratory Data: High Sensitivity Troponin:   Recent Labs  Lab 04/13/24 0222 04/13/24 0415  TROPONINIHS 3,280* 3,494*     Chemistry Recent Labs  Lab 04/13/24 0222 04/13/24 1027  NA 139 139  K 4.4 4.8  CL 110 110  CO2 20* 20*  GLUCOSE 188* 122*  BUN 31* 32*  CREATININE 1.59* 1.33*  CALCIUM  8.5* 8.4*  MG  --  2.4  GFRNONAA 47* 59*  ANIONGAP 9 9    Recent Labs  Lab 04/13/24 0222 04/13/24 1027  PROT 6.8 6.3*  ALBUMIN 3.8 3.5  AST 52* 45*  ALT 56* 48*  ALKPHOS 88 69  BILITOT 0.9 1.0   Lipids No results for input(s): CHOL, TRIG, HDL, LABVLDL, LDLCALC, CHOLHDL in the last 168 hours.  Hematology Recent Labs  Lab 04/13/24 0222 04/13/24 1027  WBC 14.8* 11.2*  RBC 4.38 4.22  HGB 13.3 12.7*  HCT 41.0 38.5*  MCV 93.6 91.2  MCH  30.4 30.1  MCHC 32.4 33.0  RDW 13.6 13.7  PLT 127* 124*   Thyroid  No results for input(s): TSH, FREET4 in the last 168 hours.  BNP Recent Labs  Lab 04/13/24 0222  BNP 647.5*    DDimer No results for input(s): DDIMER in the last 168 hours.  Radiology/Studies:  DG Chest Portable 1 View Result Date: 04/13/2024 CLINICAL DATA:  Shortness of breath EXAM: PORTABLE CHEST 1 VIEW COMPARISON:  01/07/2024 FINDINGS: Cardiac shadow is stable. Defibrillator is again seen. Increased central vascular congestion is noted with mild interstitial edema. No effusions are noted. No bony is seen. IMPRESSION: Changes consistent with CHF with interstitial edema. Electronically Signed   By: Oneil Devonshire M.D.   On: 04/13/2024 03:07   Assessment and Plan: Elevated troponin levels  CAD s/p DES to LAD, D1 Hyperlipidemia  Patient presented with shortness of breath starting last night, waking him from sleep Patient denies any prior or current chest pain Troponin level 3,280 > 3,494 (got as high as 500s in 12/2023) RHC/LHC in 08/2023 showed: Patent mid LAD and diagonal stents, high-grade ostial diagonal disease, high-grade mid LCx disease, subtotally  occluded OM, moderate diffuse disease of RCA, with 80% lesion of distal RCA, with no clear culprit lesions appropriate for PCI Currently on ASA 81 mg daily, Lipitor  80 mg daily, IV heparin  Will repeat echocardiogram and monitor for significant changes  Acute on chronic HFrEF 2/2 to medication noncompliance  Ischemic cardiomyopathy S/p MedTronic ICD post VT arrest  Patient states that he has been rationing out his medications Skipping days to make them last longer but has been fully out for about 10 days Presented 12/2023 in a very similar situation, medication noncompliance leading to an acute CHF exacerbation BNP 647 (compared to 497 in 12/2023) CXR showed CHF with interstitial edema Echo from 08/2023 showed: EF 30 to 35%, LV RWMA (present on prior studies),  normal RV function, mildly dilated LA, moderate MR Remote device check from 02/2024 showed: A paced, V sensed, not pacemaker dependent Home meds: Lasix  40 mg BID, losartan  25 mg daily, Toprol  25 mg daily, Farxiga  10 mg daily, Lipitor  80 mg daily Most recent BP 108/68, heart rate 64 Currently on 2 L of oxygen via [place], does not wear oxygen at home Noted in prior AHF note that he failed spironolactone  and Entresto  secondary to hyperkalemia, we will attempt to restart PTA Losartan  25 mg and monitor K   Given IV Lasix  40 mg x 1 dose, patient reports good urine response and improvement in symptoms Will give another dose of IV Lasix  40 mg today and monitor response, potentially continue IV or switch to PO dosing tomorrow  Currently on Farxiga  10 mg daily, Toprol  25 mg daily Restart Losartan  25 mg daily   Will repeat echocardiogram -- if there are any drastic changes can consider cardiac cath   Paroxysmal atrial fibrillation  Remote device check from 02/19/2024 showed: 0.2% A-fib burden Home meds: Toprol  25 mg daily, Xarelto  20 mg daily Patient has essentially been out of his medications for 2 weeks, rationing prior to being completely out Currently in sinus rhythm, heart rate 60s Currently on Toprol  25 mg daily, IV heparin  in place of Xarelto   Medication noncompliance  Patient has had multiple hospitalizations now resulting from running out of medications  Attempted to determine route cause of issue  He told me that he used to get medications for free and then they became expensive When asked if cost was the issue, he tells me that he thinks he didn't have refills at the pharmacy due to not following up with providers as outpatient  He was supposed to follow up with our office on 02/10/2024 and never showed  Ensure that prior to discharge patient has appropriate resources in regards to financial support with medications  Educated on importance of continuing to follow up with outpatient appointments in  an attempt to avoid recurrent hospitalizations   Per primary CKD  Diabetes   Risk Assessment/Risk Scores:    TIMI Risk Score for Unstable Angina or Non-ST Elevation MI:   The patient's TIMI risk score is 5, which indicates a 26% risk of all cause mortality, new or recurrent myocardial infarction or need for urgent revascularization in the next 14 days.  New York  Heart Association (NYHA) Functional Class NYHA Class III  CHA2DS2-VASc Score = 5   This indicates a 7.2% annual risk of stroke. The patient's score is based upon: CHF History: 1 HTN History: 1 Diabetes History: 1 Stroke History: 0 Vascular Disease History: 1 Age Score: 1 Gender Score: 0   For questions or updates, please contact [facility] Please consult  [email or web address] for contact info under   Signed, Waddell DELENA Donath, PA-C  04/13/2024 12:43 PM  History and all data above reviewed.  Patient examined.  I agree with the findings as above.  The history comes through the interpreter app.  The patient said he been doing well up until last evening.  He developed acute shortness of breath while lying in bed.  He said this is similar to but not as severe as previous episodes.  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  With this level of activity he has not been getting any shortness of breath.  He denies having any chest pressure, neck or arm discomfort.  He has not been having any palpitations, presyncope or syncope.  He has had no edema.  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  He has been managing this.  He misses appointments because of transportation.  He lives with his nephews.  He does not get his medications because the prescriptions run out.  He does try to avoid salt.  In the emergency room EKG demonstrates no acute changes but he does  have elevated cardiac enzymes.  He has edema on chest x-ray.  The patient exam reveals COR: Regular rate and rhythm, no murmurs,  Lungs: Decreased breath sounds at the bases, no wheezing,  Abd: Positive bowel sounds normal frequency and pitch, bruits, rebound, guarding, Ext no edema.  All available labs, radiology testing, previous records reviewed. Agree with documented assessment and plan.  Acute on chronic systolic heart failure: I think the biggest issue is patient's volume status and he needs IV diuresis and reinitiation of his medications.  Will note that in previous notes he was taken off of spironolactone  and Entresto  because of hyperkalemia but the last hospital discharge he was discharged on a small dose of Cozaar .  Unsure has not been taking any of these medications but we are going to restart a low-dose of Cozaar  as well his other medicines listed.  I think compliance is going to continue to be a big issue and I had a long conversation with him about this.  Elevated troponin: I doubt that he is having an acute ischemic event.  I will repeat an echo and unless there is a significant change I would not suggest repeat cardiac catheterization.  I did review personally the cath films and he has diffuse native coronary disease which is probably the etiology of the elevated troponin in the face of volume overload.  Lynwood Eisa Necaise  2:22 PM  04/13/2024

## 2024-04-14 ENCOUNTER — Telehealth: Payer: Self-pay | Admitting: Pharmacy Technician

## 2024-04-14 ENCOUNTER — Other Ambulatory Visit (HOSPITAL_COMMUNITY): Payer: Self-pay

## 2024-04-14 LAB — CBC
HCT: 37.7 % — ABNORMAL LOW (ref 39.0–52.0)
Hemoglobin: 12.8 g/dL — ABNORMAL LOW (ref 13.0–17.0)
MCH: 30.5 pg (ref 26.0–34.0)
MCHC: 34 g/dL (ref 30.0–36.0)
MCV: 90 fL (ref 80.0–100.0)
Platelets: 121 10*3/uL — ABNORMAL LOW (ref 150–400)
RBC: 4.19 MIL/uL — ABNORMAL LOW (ref 4.22–5.81)
RDW: 13.6 % (ref 11.5–15.5)
WBC: 10.4 10*3/uL (ref 4.0–10.5)
nRBC: 0 % (ref 0.0–0.2)

## 2024-04-14 LAB — BASIC METABOLIC PANEL WITH GFR
Anion gap: 12 (ref 5–15)
Anion gap: 7 (ref 5–15)
BUN: 32 mg/dL — ABNORMAL HIGH (ref 8–23)
BUN: 34 mg/dL — ABNORMAL HIGH (ref 8–23)
CO2: 23 mmol/L (ref 22–32)
CO2: 23 mmol/L (ref 22–32)
Calcium: 8.2 mg/dL — ABNORMAL LOW (ref 8.9–10.3)
Calcium: 8.3 mg/dL — ABNORMAL LOW (ref 8.9–10.3)
Chloride: 102 mmol/L (ref 98–111)
Chloride: 105 mmol/L (ref 98–111)
Creatinine, Ser: 1.37 mg/dL — ABNORMAL HIGH (ref 0.61–1.24)
Creatinine, Ser: 1.37 mg/dL — ABNORMAL HIGH (ref 0.61–1.24)
GFR, Estimated: 57 mL/min — ABNORMAL LOW (ref >60)
GFR, Estimated: 57 mL/min — ABNORMAL LOW (ref >60)
Glucose, Bld: 154 mg/dL — ABNORMAL HIGH (ref 70–99)
Glucose, Bld: 94 mg/dL (ref 70–99)
Potassium: 4 mmol/L (ref 3.5–5.1)
Potassium: 4.4 mmol/L (ref 3.5–5.1)
Sodium: 135 mmol/L (ref 135–145)
Sodium: 137 mmol/L (ref 135–145)

## 2024-04-14 LAB — GLUCOSE, CAPILLARY
Glucose-Capillary: 114 mg/dL — ABNORMAL HIGH (ref 70–99)
Glucose-Capillary: 89 mg/dL (ref 70–99)
Glucose-Capillary: 180 mg/dL — ABNORMAL HIGH (ref 70–99)
Glucose-Capillary: 128 mg/dL — ABNORMAL HIGH (ref 70–99)
Glucose-Capillary: 216 mg/dL — ABNORMAL HIGH (ref 70–99)

## 2024-04-14 LAB — HEPARIN LEVEL (UNFRACTIONATED)
Heparin Unfractionated: 0.65 [IU]/mL (ref 0.30–0.70)
Heparin Unfractionated: 0.76 [IU]/mL — ABNORMAL HIGH (ref 0.30–0.70)

## 2024-04-14 LAB — APTT: aPTT: 120 s — ABNORMAL HIGH (ref 24–36)

## 2024-04-14 MED ORDER — INSULIN ASPART 100 UNIT/ML IJ SOLN
0.0000 [IU] | Freq: Three times a day (TID) | INTRAMUSCULAR | Status: DC
  Administered 2024-04-14: 2 [IU] via SUBCUTANEOUS

## 2024-04-14 MED ORDER — RIVAROXABAN 20 MG PO TABS
20.0000 mg | ORAL_TABLET | Freq: Every day | ORAL | Status: DC
  Administered 2024-04-14: 20 mg via ORAL
  Filled 2024-04-14: qty 1

## 2024-04-14 MED ORDER — HEPARIN SODIUM (PORCINE) 5000 UNIT/ML IJ SOLN: 5000.0000 [IU] | Freq: Three times a day (TID) | INTRAMUSCULAR | Status: DC

## 2024-04-14 MED ORDER — FUROSEMIDE 10 MG/ML IJ SOLN
40.0000 mg | Freq: Two times a day (BID) | INTRAMUSCULAR | Status: DC
  Administered 2024-04-14 (×2): 40 mg via INTRAVENOUS
  Filled 2024-04-14 (×2): qty 4

## 2024-04-14 NOTE — Telephone Encounter (Signed)
 PAP: Patient assistance application for Xarelto  through [employer] has been mailed to pt's home address on file.

## 2024-04-14 NOTE — Progress Notes (Signed)
 ANTICOAGULATION CONSULT NOTE  Pharmacy Consult for Heparin  Indication: atrial fibrillation  No Known Allergies  Patient Measurements: Height: 5' 3 (160 cm) Weight: 67.6 kg (149 lb 0.5 oz) IBW/kg (Calculated) : 56.9 Vital Signs: Temp: 98.2 F (36.8 C) (06/26 2310) Temp Source: Oral (06/26 1942) BP: 103/62 (06/26 2310) Pulse Rate: 60 (06/26 1942)  Labs: Recent Labs    04/13/24 0222 04/13/24 0415 04/13/24 1027 04/13/24 1330 04/13/24 1340 04/13/24 2351  HGB 13.3  --  12.7*  --   --   --   HCT 41.0  --  38.5*  --   --   --   PLT 127*  --  124*  --   --   --   APTT  --   --   --   --  34 120*  HEPARINUNFRC  --   --   --  0.59  --  0.76*  CREATININE 1.59*  --  1.33*  --   --  1.37*  TROPONINIHS 3,280* 3,494*  --   --   --   --     Estimated Creatinine Clearance: 42.1 mL/min (A) (by C-G formula based on SCr of 1.37 mg/dL (H)).   Medical History: Past Medical History:  Diagnosis Date   Cardiac arrest [facility])    CHF (congestive heart failure) (HCC)    Coronary artery disease    Diabetes mellitus without complication (HCC)    H/O heart artery stent    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    Medications:  Medications Prior to Admission  Medication Sig Dispense Refill Last Dose/Taking   atorvastatin  (LIPITOR ) 80 MG tablet Take 1 tablet (80 mg total) by mouth daily. 30 tablet 0 Unknown   dapagliflozin  propanediol (FARXIGA ) 10 MG TABS tablet Take 1 tablet (10 mg total) by mouth daily before breakfast. 30 tablet 0 Unknown   furosemide  (LASIX ) 40 MG tablet Take 1 tablet (40 mg total) by mouth 2 (two) times daily. 60 tablet 0 Past Week   losartan  (COZAAR ) 25 MG tablet Take 1 tablet (25 mg total) by mouth daily. 30 tablet 0 Unknown   metFORMIN  (GLUCOPHAGE -XR) 500 MG 24 hr tablet Take 1 tablet (500 mg total) by mouth 2 (two) times daily with a meal. 60 tablet 0 Unknown   metoprolol  succinate (TOPROL -XL) 25 MG 24 hr tablet Take 1 tablet (25 mg  total) by mouth daily. 30 tablet 0 Unknown   rivaroxaban  (XARELTO ) 20 MG TABS tablet Take 1 tablet (20 mg total) by mouth daily wtih food. 30 tablet 0 04/12/2024   Scheduled:   aspirin  EC  81 mg Oral Daily   atorvastatin   80 mg Oral Daily   dapagliflozin  propanediol  10 mg Oral QAC breakfast   insulin  aspart  0-6 Units Subcutaneous Q4H   losartan   25 mg Oral Daily   metoprolol  succinate  25 mg Oral Daily   Infusions:   heparin  1,100 Units/hr (04/13/24 2000)   PRN:   Assessment: 68 y/o M with elevated troponin, on DOAC PTA for afib, was on apixaban  prior to hospital admission in march, possibly changed to Xarelto . Pt states to RN last dose 6/25 around 0600. Some concern with compliance, reports to EDP skipping some doses to make meds last.  Heparin  level is 0.59 but aPTT is 34. Suspect falsely high anti-Xa level secondary to DOAC use. Will use aPTT for monitoring for now.  Started on 900 units/hr IV heparin .   No notable issues  with infusion or with bleeding per RN. Patient does keep bending arm causing occlusion error on pump per RN.  Hgb 13.3>12.7; plt 124  6/27 AM update:  Heparin  level and aPTT elevated and correlating Will DC further aPTTs  Goal of Therapy:  Heparin  level 0.3-0.7 units/ml Monitor platelets by anticoagulation protocol: Yes   Plan:  Dec heparin  to 1000 units/hr Heparin  level in 8 hours  Lynwood Mckusick, PharmD, BCPS Clinical Pharmacist Phone: [phone]

## 2024-04-14 NOTE — TOC Initial Note (Addendum)
 Transition of Care [facility]) - Initial/Assessment Note    Patient Details  Name: Pedro Kemp MRN: [id number] Date of Birth: [birth year]  Transition of Care [facility]) CM/SW Contact:    Roxie KANDICE Stain, RN Phone Number: 04/14/2024, 4:24 PM  Clinical Narrative:                  Spoke to patient regarding transition needs using translator.  Patient lives with nephews who work. Patient requesting FC and transportation resources.  Sent [facility] email for emergency medicaid and transportation resources add to [facility] letter done and sent tp toc pharmacy. Address, Phone number and PCP verified.  Expected Discharge Plan: Home/Self Care Barriers to Discharge: Continued Medical Work up   Patient Goals and CMS Choice Patient states their goals for this hospitalization and ongoing recovery are:: return home          Expected Discharge Plan and Services In-house Referral: Artist, PCP / [facility] Discharge Planning Services: Medication Assistance, MATCH Program   Living arrangements for the past 2 months: Single Family Home                                      Prior Living Arrangements/Services Living arrangements for the past 2 months: Single Family Home Lives with:: Relatives Patient language and need for interpreter reviewed:: Yes Do you feel safe going back to the place where you live?: Yes      Need for Family Participation in Patient Care: Yes (Comment) Care giver support system in place?: Yes (comment)   Criminal Activity/Legal Involvement Pertinent to Current Situation/Hospitalization: No - Comment as needed  Activities of Daily Living   ADL Screening (condition at time of admission) Independently performs ADLs?: Yes (appropriate for developmental age) Is the patient deaf or have difficulty hearing?: No Does the patient have difficulty seeing, even when wearing glasses/contacts?: No Does the patient have difficulty concentrating, remembering, or  making decisions?: No  Permission Sought/Granted                  Emotional Assessment Appearance:: Appears stated age Attitude/Demeanor/Rapport: Engaged Affect (typically observed): Accepting Orientation: : Oriented to Self, Oriented to Place, Oriented to  Time, Oriented to Situation Alcohol / Substance Use: Not Applicable Psych Involvement: No (comment)  Admission diagnosis:  Thrombocytopenia (HCC) [D69.6] Renal insufficiency [N28.9] Non-STEMI (non-ST elevated myocardial infarction) (HCC) [I21.4] Elevated transaminase level [R74.01] Acute exacerbation of chronic heart failure (HCC) [I50.9] Patient Active Problem List   Diagnosis Date Noted   Non-STEMI (non-ST elevated myocardial infarction) (HCC) 04/13/2024   Ischemic cardiomyopathy 01/08/2024   Long term (current) use of anticoagulants 01/08/2024   Acute on chronic systolic CHF (congestive heart failure) (HCC) 01/07/2024   history of VT arrest 01/07/2024   Multifocal pneumonia 08/24/2023   Acute respiratory failure with hypoxia (HCC) 08/24/2023   Inability to urinate 08/24/2023   NSTEMI (non-ST elevated myocardial infarction) (HCC) 08/24/2023   Paroxysmal atrial fibrillation (HCC) 11/12/2022   NSVT (nonsustained ventricular tachycardia) (HCC) 11/12/2022   ICD (implantable cardioverter-defibrillator) in place 11/12/2022   Stage 3a chronic kidney disease (HCC) 11/12/2022   RTA (renal tubular acidosis) 11/12/2022   Acute on chronic systolic (congestive) heart failure (HCC) 10/24/2022   Acute on chronic HFrEF (heart failure with reduced ejection fraction) (HCC) 01/29/2022   Coronary artery disease 01/29/2022   History of cardiac arrest 01/29/2022   Leukocytosis 01/29/2022   Type 2 diabetes mellitus with  hyperglycemia, without long-term current use of insulin  (HCC) 07/22/2021   Chronic HFrEF (heart failure with reduced ejection fraction) (HCC) 07/22/2021   Hyperlipidemia associated with type 2 diabetes mellitus (HCC)  07/22/2021   Essential hypertension 07/22/2021   PCP:  Celestia Rosaline SQUIBB, NP Pharmacy:   [facility] [address] [place] [address] Phone: [phone] Fax: [phone]     Social Drivers of Health (SDOH) Social History: SDOH Screenings   Food Insecurity: Food Insecurity Present (04/13/2024)  Housing: High Risk (04/13/2024)  Transportation Needs: Unmet Transportation Needs (04/13/2024)  Utilities: Not At Risk (04/13/2024)  Alcohol Screen: Low Risk  (08/25/2023)  Depression (PHQ2-9): Low Risk  (11/11/2022)  Financial Resource Strain: High Risk (08/25/2023)  Social Connections: Socially Isolated (04/13/2024)  Tobacco Use: Low Risk  (04/13/2024)   SDOH Interventions:     Readmission Risk Interventions    08/26/2023   12:29 PM  Readmission Risk Prevention Plan  Transportation Screening Complete  PCP or Specialist Appt within 5-7 Days Not Complete  Not Complete comments follow up apt 11/18  Home Care Screening Complete  Medication Review (RN CM) Complete

## 2024-04-14 NOTE — Plan of Care (Signed)
   Problem: Education: Goal: Individualized Educational Video(s) Outcome: Progressing   Problem: Coping: Goal: Ability to adjust to condition or change in health will improve Outcome: Progressing

## 2024-04-14 NOTE — TOC CM/SW Note (Addendum)
  MATCH MEDICATION ASSISTANCE CARD Pharmacies please call [phone] for claim processing assistance.  Rx BIN: [id number] Rx Group: [id number] Rx PCN: [id number] Relationship Code: 1 Person Code: 01  Patient ID (MRN): [id number]    Patient Name: Pedro Kemp   Patient DOB [birth year]   Discharge Date:04/14/2024  Expiration Date:04/20/2024 (must be filled within 7 days of discharge)

## 2024-04-14 NOTE — Discharge Instructions (Signed)
 Senior wheels [phone]

## 2024-04-14 NOTE — Telephone Encounter (Signed)
 PAP: Patient assistance application for Farxiga  through [employer] ([employer]) has been mailed to pt's home address on file.

## 2024-04-14 NOTE — Progress Notes (Addendum)
 Progress Note  Patient Name: Pedro Kemp Date of Encounter: 04/14/2024  Primary Cardiologist:   None   Subjective   Interpretor used.  Denies SOB or Pain.   Inpatient Medications    Scheduled Meds:  aspirin  EC  81 mg Oral Daily   atorvastatin   80 mg Oral Daily   dapagliflozin  propanediol  10 mg Oral QAC breakfast   insulin  aspart  0-6 Units Subcutaneous Q4H   losartan   25 mg Oral Daily   metoprolol  succinate  25 mg Oral Daily   Continuous Infusions:  heparin  1,000 Units/hr (04/14/24 0543)   PRN Meds:    Vital Signs    Vitals:   04/13/24 2310 04/14/24 0405 04/14/24 0406 04/14/24 0720  BP: 103/62 (!) 99/53 (!) 100/54 121/60  Pulse:    (!) 59  Resp: 20 19 17  (!) 23  Temp: 98.2 F (36.8 C) 98.4 F (36.9 C)  98.3 F (36.8 C)  TempSrc:  Oral  Oral  SpO2: 96% 96% 95% 93%  Weight:      Height:        Intake/Output Summary (Last 24 hours) at 04/14/2024 0743 Last data filed at 04/14/2024 0543 Gross per 24 hour  Intake 253.59 ml  Output 1550 ml  Net -1296.41 ml   Filed Weights   04/13/24 1735  Weight: 67.6 kg    Telemetry    NSR - Personally Reviewed  ECG    NA - Personally Reviewed  Physical Exam   GEN: No acute distress.   Neck:   No JVD, positive HJR Cardiac: RRR, no murmurs, rubs, or gallops.  Respiratory:     Decreased breath sounds with fine basilar crackles 1/4 up both lungs GI: Soft, nontender, non-distended  MS: No  edema; No deformity. Neuro:  Nonfocal  Psych: Normal affect   Labs    Chemistry Recent Labs  Lab 04/13/24 0222 04/13/24 1027 04/13/24 2351  NA 139 139 137  K 4.4 4.8 4.0  CL 110 110 102  CO2 20* 20* 23  GLUCOSE 188* 122* 154*  BUN 31* 32* 34*  CREATININE 1.59* 1.33* 1.37*  CALCIUM  8.5* 8.4* 8.2*  PROT 6.8 6.3*  --   ALBUMIN 3.8 3.5  --   AST 52* 45*  --   ALT 56* 48*  --   ALKPHOS 88 69  --   BILITOT 0.9 1.0  --   GFRNONAA 47* 59* 57*  ANIONGAP 9 9 12      Hematology Recent Labs  Lab  04/13/24 0222 04/13/24 1027  WBC 14.8* 11.2*  RBC 4.38 4.22  HGB 13.3 12.7*  HCT 41.0 38.5*  MCV 93.6 91.2  MCH 30.4 30.1  MCHC 32.4 33.0  RDW 13.6 13.7  PLT 127* 124*    Cardiac EnzymesNo results for input(s): TROPONINI in the last 168 hours. No results for input(s): TROPIPOC in the last 168 hours.   BNP Recent Labs  Lab 04/13/24 0222  BNP 647.5*     DDimer No results for input(s): DDIMER in the last 168 hours.   Radiology    ECHOCARDIOGRAM COMPLETE Result Date: 04/13/2024    ECHOCARDIOGRAM REPORT   Patient Name:   Pedro Kemp Date of Exam: 04/13/2024 Medical Rec #:  [id number]         Height:       64.0 in Accession #:    [id number]        Weight:       156.6 lb Date of Birth:  1956/06/30  BSA:          1.763 m Patient Age:    68 years          BP:           127/72 mmHg Patient Gender: M                 HR:           65 bpm. Exam Location:  Inpatient Procedure: 2D Echo, Color Doppler and Cardiac Doppler (Both Spectral and Color            Flow Doppler were utilized during procedure). Indications:    Elevated troponin  History:        Patient has prior history of Echocardiogram examinations, most                 recent 10/24/2022.  Sonographer:    Benard Stallion Referring Phys: [id number] TAYLOR A PARCELLS IMPRESSIONS  1. Left ventricular ejection fraction, by estimation, is 25 to 30%. Left ventricular ejection fraction by 2D MOD biplane is 25.1 %. The left ventricle has severely decreased function. The left ventricle demonstrates regional wall motion abnormalities (see scoring diagram/findings for description). The left ventricular internal cavity size was mildly dilated. Left ventricular diastolic parameters are consistent with Grade II diastolic dysfunction (pseudonormalization). Elevated left atrial pressure.  2. Right ventricular systolic function is low normal. The right ventricular size is normal. There is normal pulmonary artery systolic pressure.  3. Left atrial  size was severely dilated.  4. The mitral valve is normal in structure. Mild mitral valve regurgitation. No evidence of mitral stenosis.  5. The aortic valve is tricuspid. Aortic valve regurgitation is not visualized. No aortic stenosis is present.  6. The inferior vena cava is normal in size with greater than 50% respiratory variability, suggesting right atrial pressure of 3 mmHg. FINDINGS  Left Ventricle: Left ventricular ejection fraction, by estimation, is 25 to 30%. Left ventricular ejection fraction by 2D MOD biplane is 25.1 %. The left ventricle has severely decreased function. The left ventricle demonstrates regional wall motion abnormalities. The left ventricular internal cavity size was mildly dilated. There is no left ventricular hypertrophy. Left ventricular diastolic parameters are consistent with Grade II diastolic dysfunction (pseudonormalization). Elevated left atrial pressure.  LV Wall Scoring: Akinetic anterior, anteroseptal, and anterolateral walls. Right Ventricle: The right ventricular size is normal. No increase in right ventricular wall thickness. Right ventricular systolic function is low normal. There is normal pulmonary artery systolic pressure. The tricuspid regurgitant velocity is 2.52 m/s,  and with an assumed right atrial pressure of 3 mmHg, the estimated right ventricular systolic pressure is 28.4 mmHg. Left Atrium: Left atrial size was severely dilated. Right Atrium: Right atrial size was normal in size. Pericardium: There is no evidence of pericardial effusion. Mitral Valve: The mitral valve is normal in structure. Mild mitral valve regurgitation. No evidence of mitral valve stenosis. MV peak gradient, 5.2 mmHg. The mean mitral valve gradient is 2.0 mmHg. Tricuspid Valve: The tricuspid valve is normal in structure. Tricuspid valve regurgitation is mild . No evidence of tricuspid stenosis. Aortic Valve: The aortic valve is tricuspid. Aortic valve regurgitation is not visualized. No  aortic stenosis is present. Aortic valve mean gradient measures 2.0 mmHg. Aortic valve peak gradient measures 4.1 mmHg. Aortic valve area, by VTI measures 1.92 cm. Pulmonic Valve: The pulmonic valve was normal in structure. Pulmonic valve regurgitation is not visualized. No evidence of pulmonic stenosis. Aorta: The aortic root is normal  in size and structure. Venous: The inferior vena cava is normal in size with greater than 50% respiratory variability, suggesting right atrial pressure of 3 mmHg. IAS/Shunts: No atrial level shunt detected by color flow Doppler. Additional Comments: A device lead is visualized.  LEFT VENTRICLE PLAX 2D                        Biplane EF (MOD) LVIDd:         6.10 cm         LV Biplane EF:   Left LVIDs:         5.30 cm                          ventricular LV PW:         1.00 cm                          ejection LV IVS:        1.00 cm                          fraction by LVOT diam:     2.00 cm                          2D MOD LV SV:         42                               biplane is LV SV Index:   24                               25.1 %. LVOT Area:     3.14 cm                                Diastology                                LV e' medial:    5.44 cm/s LV Volumes (MOD)               LV E/e' medial:  22.1 LV vol d, MOD    137.0 ml      LV e' lateral:   6.53 cm/s A2C:                           LV E/e' lateral: 18.4 LV vol d, MOD    150.0 ml A4C: LV vol s, MOD    100.0 ml A2C: LV vol s, MOD    113.0 ml A4C: LV SV MOD A2C:   37.0 ml LV SV MOD A4C:   150.0 ml LV SV MOD BP:    37.4 ml RIGHT VENTRICLE RV Basal diam:  3.30 cm RV Mid diam:    2.50 cm RV S prime:     10.10 cm/s TAPSE (M-mode): 2.0 cm LEFT ATRIUM              Index        RIGHT ATRIUM  Index LA diam:        4.80 cm  2.72 cm/m   RA Area:     15.30 cm LA Vol (A2C):   113.0 ml 64.09 ml/m  RA Volume:   39.80 ml  22.57 ml/m LA Vol (A4C):   100.0 ml 56.72 ml/m LA Biplane Vol: 109.0 ml 61.82 ml/m  AORTIC VALVE AV  Area (Vmax):    2.06 cm AV Area (Vmean):   1.90 cm AV Area (VTI):     1.92 cm AV Vmax:           101.00 cm/s AV Vmean:          67.900 cm/s AV VTI:            0.219 m AV Peak Grad:      4.1 mmHg AV Mean Grad:      2.0 mmHg LVOT Vmax:         66.10 cm/s LVOT Vmean:        41.100 cm/s LVOT VTI:          0.134 m LVOT/AV VTI ratio: 0.61  AORTA Ao Root diam: 3.10 cm Ao Asc diam:  3.20 cm MITRAL VALVE                TRICUSPID VALVE MV Area (PHT): 5.66 cm     TR Peak grad:   25.4 mmHg MV Area VTI:   1.13 cm     TR Vmax:        252.00 cm/s MV Peak grad:  5.2 mmHg MV Mean grad:  2.0 mmHg     SHUNTS MV Vmax:       1.14 m/s     Systemic VTI:  0.13 m MV Vmean:      58.2 cm/s    Systemic Diam: 2.00 cm MV Decel Time: 134 msec MR Peak grad: 50.4 mmHg MR Vmax:      355.00 cm/s MV E velocity: 120.00 cm/s MV A velocity: 54.40 cm/s MV E/A ratio:  2.21 Morene Brownie Electronically signed by Morene Brownie Signature Date/Time: 04/13/2024/2:26:44 PM    Final    DG Chest Portable 1 View Result Date: 04/13/2024 CLINICAL DATA:  Shortness of breath EXAM: PORTABLE CHEST 1 VIEW COMPARISON:  01/07/2024 FINDINGS: Cardiac shadow is stable. Defibrillator is again seen. Increased central vascular congestion is noted with mild interstitial edema. No effusions are noted. No bony is seen. IMPRESSION: Changes consistent with CHF with interstitial edema. Electronically Signed   By: Oneil Devonshire M.D.   On: 04/13/2024 03:07    Cardiac Studies   Echo:     1. Left ventricular ejection fraction, by estimation, is 25 to 30%. Left  ventricular ejection fraction by 2D MOD biplane is 25.1 %. The left  ventricle has severely decreased function. The left ventricle demonstrates  regional wall motion abnormalities  (see scoring diagram/findings for description). The left ventricular  internal cavity size was mildly dilated. Left ventricular diastolic  parameters are consistent with Grade II diastolic dysfunction  (pseudonormalization).  Elevated left atrial pressure.   2. Right ventricular systolic function is low normal. The right  ventricular size is normal. There is normal pulmonary artery systolic  pressure.   3. Left atrial size was severely dilated.   4. The mitral valve is normal in structure. Mild mitral valve  regurgitation. No evidence of mitral stenosis.   5. The aortic valve is tricuspid. Aortic valve regurgitation is not  visualized. No aortic stenosis is present.   6. The  inferior vena cava is normal in size with greater than 50%  respiratory variability, suggesting right atrial pressure of 3 mmHg.   Patient Profile     68 y.o. male with a hx of ischemic cardiomyopathy with reduced EF, s/p MedTronic ICD post VT arrest in 2022, paroxsymal atrial fibrillation on Xarelto , CAD s/p DES to LAD, D1 (04/2021 at [facility]), hypertension, type 2 diabetes, MR who is being seen 04/13/2024 for the evaluation of NSTEMI at the request of Dr. Franky.    Assessment & Plan    CAD:  The patient has an elevated troponin likely secondary ischemia related to acute exacerbations of systolic HF.   EF is relatively the same and he has had no anginal symptoms.  No plan for cardiac cath.   Will stop heparin   Acute systolic HF:  Intake and output incomplete.  Net negative at least 1296.  Resumed previous meds.  Still with volume overload so I will resume IV Lasix  BID.    CKD:  Creat is stable.  Follow.    For questions or updates, please contact [facility] Please consult [email or web address] for contact info under Cardiology/STEMI.   Signed, Lynwood Schilling, MD  04/14/2024, 7:43 AM

## 2024-04-14 NOTE — Progress Notes (Signed)
 PROGRESS NOTE    Pedro Kemp  FMW:[id number] DOB: 05/21/1956 DOA: 04/13/2024 PCP: Celestia Rosaline SQUIBB, NP   Brief Narrative: 68 year old past medical history of non-STEMI status post PCI to LAD at [facility], chronic heart failure reduced ejection fraction, history of VT arrest status post ICD placement, diabetes type 2, CKD stage II, A-fib presented to the ED complaining of shortness of breath.  Evaluation in the ED chest x-ray with effusion concerning for heart failure, received IV Lasix , BNP 647, troponin 3200.  EKG sinus rhythm.  Cardiology has been consulted.  Patient was started on heparin  drip.    Assessment & Plan:   Principal Problem:   Non-STEMI (non-ST elevated myocardial infarction) (HCC) Active Problems:   Acute on chronic HFrEF (heart failure with reduced ejection fraction) (HCC)   Paroxysmal atrial fibrillation (HCC)   Type 2 diabetes mellitus with hyperglycemia, without long-term current use of insulin  (HCC)   history of VT arrest   History of cardiac arrest   ICD (implantable cardioverter-defibrillator) in place   Ischemic cardiomyopathy   1-CAD elevation troponin, demand ischemia in setting HF exacerbation.  - Patient presented with dyspnea found to have troponin 3000 range -Treated with  heparin  drip--discontinue by cardiology  -Cardiology  consulted, recommend medical management.  -Continue with metoprolol , aspirin  and statins.   Acute on chronic heart failure reduced ejection fraction, history of VT arrest status post AICD: - Patient presented with shortness of breath, chest x-ray with pulmonary edema.  Received a dose of IV Lasix . -Started on Farxiga , Cozaar .  -Negative 1.1 L -feeling better.  IV lasix  resume today   CKD stage II -last GFR was at 60 -monitor renal function.  -Cr baseline 1.3  Diabetes type 2: -hold metformin .  -SSI.  -he will need medications refill  Paroxysmal A-fib: -Continue with metoprolol .  Xarelto  resume today    Leukocytosis;  Trending down.   Estimated body mass index is 26.4 kg/m as calculated from the following:   Height as of this encounter: 5' 3 (1.6 m).   Weight as of this encounter: 67.6 kg.   DVT prophylaxis: Heparin  gtt Code Status: Full code Family Communication: Sister who was at bedside 6/26 Disposition Plan:  Status is: Inpatient Remains inpatient appropriate because: management of dyspnea, NSTEMI    Consultants:  Cardiology   Procedures:    Antimicrobials:    Subjective: He is feeling better. Denies chest pain. Breathing better  Objective: Vitals:   04/13/24 1942 04/13/24 2310 04/14/24 0405 04/14/24 0406  BP: 124/68 103/62 (!) 99/53 (!) 100/54  Pulse: 60     Resp: 10 20 19 17   Temp: 97.6 F (36.4 C) 98.2 F (36.8 C) 98.4 F (36.9 C)   TempSrc: Oral  Oral   SpO2: 97% 96% 96% 95%  Weight:      Height:        Intake/Output Summary (Last 24 hours) at 04/14/2024 0650 Last data filed at 04/14/2024 0543 Gross per 24 hour  Intake 253.59 ml  Output 1550 ml  Net -1296.41 ml   Filed Weights   04/13/24 1735  Weight: 67.6 kg    Examination:  General exam: NAD Respiratory system: BL crackles.  Cardiovascular system: S 1,S 2 RRR Gastrointestinal system: BS present, soft, nt Central nervous system: alert, follows command Extremities: no edema   Data Reviewed: I have personally reviewed following labs and imaging studies  CBC: Recent Labs  Lab 04/13/24 0222 04/13/24 1027  WBC 14.8* 11.2*  NEUTROABS 7.7 8.2*  HGB 13.3 12.7*  HCT 41.0 38.5*  MCV 93.6 91.2  PLT 127* 124*   Basic Metabolic Panel: Recent Labs  Lab 04/13/24 0222 04/13/24 1027 04/13/24 2351  NA 139 139 137  K 4.4 4.8 4.0  CL 110 110 102  CO2 20* 20* 23  GLUCOSE 188* 122* 154*  BUN 31* 32* 34*  CREATININE 1.59* 1.33* 1.37*  CALCIUM  8.5* 8.4* 8.2*  MG  --  2.4  --    GFR: Estimated Creatinine Clearance: 42.1 mL/min (A) (by C-G formula based on SCr of 1.37 mg/dL  (H)). Liver Function Tests: Recent Labs  Lab 04/13/24 0222 04/13/24 1027  AST 52* 45*  ALT 56* 48*  ALKPHOS 88 69  BILITOT 0.9 1.0  PROT 6.8 6.3*  ALBUMIN 3.8 3.5   No results for input(s): LIPASE, AMYLASE in the last 168 hours. No results for input(s): AMMONIA in the last 168 hours. Coagulation Profile: No results for input(s): INR, PROTIME in the last 168 hours. Cardiac Enzymes: No results for input(s): CKTOTAL, CKMB, CKMBINDEX, TROPONINI in the last 168 hours. BNP (last 3 results) No results for input(s): PROBNP in the last 8760 hours. HbA1C: No results for input(s): HGBA1C in the last 72 hours. CBG: Recent Labs  Lab 04/13/24 1155 04/13/24 1645 04/13/24 1944 04/13/24 2309 04/14/24 0404  GLUCAP 94 85 87 166* 114*   Lipid Profile: No results for input(s): CHOL, HDL, LDLCALC, TRIG, CHOLHDL, LDLDIRECT in the last 72 hours. Thyroid  Function Tests: No results for input(s): TSH, T4TOTAL, FREET4, T3FREE, THYROIDAB in the last 72 hours. Anemia Panel: No results for input(s): VITAMINB12, FOLATE, FERRITIN, TIBC, IRON, RETICCTPCT in the last 72 hours. Sepsis Labs: No results for input(s): PROCALCITON, LATICACIDVEN in the last 168 hours.  Recent Results (from the past 240 hours)  MRSA Next Gen by PCR, Nasal     Status: None   Collection Time: 04/13/24  6:03 PM   Specimen: Nasal Mucosa; Nasal Swab  Result Value Ref Range Status   MRSA by PCR Next Gen NOT DETECTED NOT DETECTED Final    Comment: (NOTE) The GeneXpert MRSA Assay (FDA approved for NASAL specimens only), is one component of a comprehensive MRSA colonization surveillance program. It is not intended to diagnose MRSA infection nor to guide or monitor treatment for MRSA infections. Test performance is not FDA approved in patients less than 70 years old. Performed at [facility], [address] [place], [place] [address]          Radiology  Studies: ECHOCARDIOGRAM COMPLETE Result Date: 04/13/2024    ECHOCARDIOGRAM REPORT   Patient Name:   Pedro Kemp Date of Exam: 04/13/2024 Medical Rec #:  [id number]         Height:       64.0 in Accession #:    [id number]        Weight:       156.6 lb Date of Birth:  October 14, 1956         BSA:          1.763 m Patient Age:    68 years          BP:           127/72 mmHg Patient Gender: M                 HR:           65 bpm. Exam Location:  Inpatient Procedure: 2D Echo, Color Doppler and Cardiac Doppler (Both Spectral and Color  Flow Doppler were utilized during procedure). Indications:    Elevated troponin  History:        Patient has prior history of Echocardiogram examinations, most                 recent 10/24/2022.  Sonographer:    Benard Stallion Referring Phys: [id number] TAYLOR A PARCELLS IMPRESSIONS  1. Left ventricular ejection fraction, by estimation, is 25 to 30%. Left ventricular ejection fraction by 2D MOD biplane is 25.1 %. The left ventricle has severely decreased function. The left ventricle demonstrates regional wall motion abnormalities (see scoring diagram/findings for description). The left ventricular internal cavity size was mildly dilated. Left ventricular diastolic parameters are consistent with Grade II diastolic dysfunction (pseudonormalization). Elevated left atrial pressure.  2. Right ventricular systolic function is low normal. The right ventricular size is normal. There is normal pulmonary artery systolic pressure.  3. Left atrial size was severely dilated.  4. The mitral valve is normal in structure. Mild mitral valve regurgitation. No evidence of mitral stenosis.  5. The aortic valve is tricuspid. Aortic valve regurgitation is not visualized. No aortic stenosis is present.  6. The inferior vena cava is normal in size with greater than 50% respiratory variability, suggesting right atrial pressure of 3 mmHg. FINDINGS  Left Ventricle: Left ventricular ejection fraction, by  estimation, is 25 to 30%. Left ventricular ejection fraction by 2D MOD biplane is 25.1 %. The left ventricle has severely decreased function. The left ventricle demonstrates regional wall motion abnormalities. The left ventricular internal cavity size was mildly dilated. There is no left ventricular hypertrophy. Left ventricular diastolic parameters are consistent with Grade II diastolic dysfunction (pseudonormalization). Elevated left atrial pressure.  LV Wall Scoring: Akinetic anterior, anteroseptal, and anterolateral walls. Right Ventricle: The right ventricular size is normal. No increase in right ventricular wall thickness. Right ventricular systolic function is low normal. There is normal pulmonary artery systolic pressure. The tricuspid regurgitant velocity is 2.52 m/s,  and with an assumed right atrial pressure of 3 mmHg, the estimated right ventricular systolic pressure is 28.4 mmHg. Left Atrium: Left atrial size was severely dilated. Right Atrium: Right atrial size was normal in size. Pericardium: There is no evidence of pericardial effusion. Mitral Valve: The mitral valve is normal in structure. Mild mitral valve regurgitation. No evidence of mitral valve stenosis. MV peak gradient, 5.2 mmHg. The mean mitral valve gradient is 2.0 mmHg. Tricuspid Valve: The tricuspid valve is normal in structure. Tricuspid valve regurgitation is mild . No evidence of tricuspid stenosis. Aortic Valve: The aortic valve is tricuspid. Aortic valve regurgitation is not visualized. No aortic stenosis is present. Aortic valve mean gradient measures 2.0 mmHg. Aortic valve peak gradient measures 4.1 mmHg. Aortic valve area, by VTI measures 1.92 cm. Pulmonic Valve: The pulmonic valve was normal in structure. Pulmonic valve regurgitation is not visualized. No evidence of pulmonic stenosis. Aorta: The aortic root is normal in size and structure. Venous: The inferior vena cava is normal in size with greater than 50% respiratory  variability, suggesting right atrial pressure of 3 mmHg. IAS/Shunts: No atrial level shunt detected by color flow Doppler. Additional Comments: A device lead is visualized.  LEFT VENTRICLE PLAX 2D                        Biplane EF (MOD) LVIDd:         6.10 cm         LV Biplane EF:   Left LVIDs:  5.30 cm                          ventricular LV PW:         1.00 cm                          ejection LV IVS:        1.00 cm                          fraction by LVOT diam:     2.00 cm                          2D MOD LV SV:         42                               biplane is LV SV Index:   24                               25.1 %. LVOT Area:     3.14 cm                                Diastology                                LV e' medial:    5.44 cm/s LV Volumes (MOD)               LV E/e' medial:  22.1 LV vol d, MOD    137.0 ml      LV e' lateral:   6.53 cm/s A2C:                           LV E/e' lateral: 18.4 LV vol d, MOD    150.0 ml A4C: LV vol s, MOD    100.0 ml A2C: LV vol s, MOD    113.0 ml A4C: LV SV MOD A2C:   37.0 ml LV SV MOD A4C:   150.0 ml LV SV MOD BP:    37.4 ml RIGHT VENTRICLE RV Basal diam:  3.30 cm RV Mid diam:    2.50 cm RV S prime:     10.10 cm/s TAPSE (M-mode): 2.0 cm LEFT ATRIUM              Index        RIGHT ATRIUM           Index LA diam:        4.80 cm  2.72 cm/m   RA Area:     15.30 cm LA Vol (A2C):   113.0 ml 64.09 ml/m  RA Volume:   39.80 ml  22.57 ml/m LA Vol (A4C):   100.0 ml 56.72 ml/m LA Biplane Vol: 109.0 ml 61.82 ml/m  AORTIC VALVE AV Area (Vmax):    2.06 cm AV Area (Vmean):   1.90 cm AV Area (VTI):     1.92 cm AV Vmax:           101.00 cm/s AV Vmean:  67.900 cm/s AV VTI:            0.219 m AV Peak Grad:      4.1 mmHg AV Mean Grad:      2.0 mmHg LVOT Vmax:         66.10 cm/s LVOT Vmean:        41.100 cm/s LVOT VTI:          0.134 m LVOT/AV VTI ratio: 0.61  AORTA Ao Root diam: 3.10 cm Ao Asc diam:  3.20 cm MITRAL VALVE                TRICUSPID VALVE MV Area (PHT):  5.66 cm     TR Peak grad:   25.4 mmHg MV Area VTI:   1.13 cm     TR Vmax:        252.00 cm/s MV Peak grad:  5.2 mmHg MV Mean grad:  2.0 mmHg     SHUNTS MV Vmax:       1.14 m/s     Systemic VTI:  0.13 m MV Vmean:      58.2 cm/s    Systemic Diam: 2.00 cm MV Decel Time: 134 msec MR Peak grad: 50.4 mmHg MR Vmax:      355.00 cm/s MV E velocity: 120.00 cm/s MV A velocity: 54.40 cm/s MV E/A ratio:  2.21 Morene Brownie Electronically signed by Morene Brownie Signature Date/Time: 04/13/2024/2:26:44 PM    Final    DG Chest Portable 1 View Result Date: 04/13/2024 CLINICAL DATA:  Shortness of breath EXAM: PORTABLE CHEST 1 VIEW COMPARISON:  01/07/2024 FINDINGS: Cardiac shadow is stable. Defibrillator is again seen. Increased central vascular congestion is noted with mild interstitial edema. No effusions are noted. No bony is seen. IMPRESSION: Changes consistent with CHF with interstitial edema. Electronically Signed   By: Oneil Devonshire M.D.   On: 04/13/2024 03:07        Scheduled Meds:  aspirin  EC  81 mg Oral Daily   atorvastatin   80 mg Oral Daily   dapagliflozin  propanediol  10 mg Oral QAC breakfast   insulin  aspart  0-6 Units Subcutaneous Q4H   losartan   25 mg Oral Daily   metoprolol  succinate  25 mg Oral Daily   Continuous Infusions:  heparin  1,000 Units/hr (04/14/24 0543)     LOS: 1 day    Time spent: 35 minutes    Zaccai Chavarin A Jannice Beitzel, MD [facility]   If 7PM-7AM, please contact night-coverage [email or web address]  04/14/2024, 6:50 AM

## 2024-04-14 NOTE — Progress Notes (Signed)
 ANTICOAGULATION CONSULT NOTE  Pharmacy Consult for Heparin  Indication: atrial fibrillation  No Known Allergies  Patient Measurements: Height: 5' 3 (160 cm) Weight: 67.6 kg (149 lb 0.5 oz) IBW/kg (Calculated) : 56.9 Vital Signs: Temp: 98.3 F (36.8 C) (06/27 0720) Temp Source: Oral (06/27 0720) BP: 121/60 (06/27 0720) Pulse Rate: 59 (06/27 0720)  Labs: Recent Labs    04/13/24 0222 04/13/24 0415 04/13/24 1027 04/13/24 1330 04/13/24 1340 04/13/24 2351 04/14/24 0912  HGB 13.3  --  12.7*  --   --   --  12.8*  HCT 41.0  --  38.5*  --   --   --  37.7*  PLT 127*  --  124*  --   --   --  121*  APTT  --   --   --   --  34 120*  --   HEPARINUNFRC  --   --   --  0.59  --  0.76* 0.65  CREATININE 1.59*  --  1.33*  --   --  1.37* 1.37*  TROPONINIHS 3,280* 3,494*  --   --   --   --   --     Estimated Creatinine Clearance: 42.1 mL/min (A) (by C-G formula based on SCr of 1.37 mg/dL (H)).   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    Medications:  Medications Prior to Admission  Medication Sig Dispense Refill Last Dose/Taking   atorvastatin  (LIPITOR ) 80 MG tablet Take 1 tablet (80 mg total) by mouth daily. 30 tablet 0 Unknown   dapagliflozin  propanediol (FARXIGA ) 10 MG TABS tablet Take 1 tablet (10 mg total) by mouth daily before breakfast. 30 tablet 0 Unknown   furosemide  (LASIX ) 40 MG tablet Take 1 tablet (40 mg total) by mouth 2 (two) times daily. 60 tablet 0 Past Week   losartan  (COZAAR ) 25 MG tablet Take 1 tablet (25 mg total) by mouth daily. 30 tablet 0 Unknown   metFORMIN  (GLUCOPHAGE -XR) 500 MG 24 hr tablet Take 1 tablet (500 mg total) by mouth 2 (two) times daily with a meal. 60 tablet 0 Unknown   metoprolol  succinate  (TOPROL -XL) 25 MG 24 hr tablet Take 1 tablet (25 mg total) by mouth daily. 30 tablet 0 Unknown   rivaroxaban  (XARELTO ) 20 MG TABS tablet Take 1 tablet (20 mg total) by mouth daily wtih food. 30 tablet 0 04/12/2024   Scheduled:   aspirin  EC  81 mg Oral Daily   atorvastatin   80 mg Oral Daily   dapagliflozin  propanediol  10 mg Oral QAC breakfast   furosemide   40 mg Intravenous BID   insulin  aspart  0-6 Units Subcutaneous Q4H   losartan   25 mg Oral Daily   metoprolol  succinate  25 mg Oral Daily   Infusions:   heparin  1,000 Units/hr (04/14/24 0543)   PRN:   Assessment: 68 y/o M with elevated troponin, on DOAC PTA for afib, was on apixaban  prior to hospital admission in march, possibly changed to Xarelto .   Heparin  level therapeutic, CBC stable.  Goal of Therapy:  Heparin  level 0.3-0.7 units/ml Monitor platelets by anticoagulation protocol: Yes   Plan:  Continue heparin  1000 units/h Daily heparin  level and CBC  Ozell Jamaica, PharmD,  BCPS, BCCP Clinical Pharmacist [phone] Please check AMION for all [facility] numbers 04/14/2024

## 2024-04-15 ENCOUNTER — Other Ambulatory Visit (HOSPITAL_COMMUNITY): Payer: Self-pay

## 2024-04-15 LAB — BASIC METABOLIC PANEL WITH GFR
Anion gap: 7 (ref 5–15)
BUN: 43 mg/dL — ABNORMAL HIGH (ref 8–23)
CO2: 25 mmol/L (ref 22–32)
Calcium: 8.5 mg/dL — ABNORMAL LOW (ref 8.9–10.3)
Chloride: 106 mmol/L (ref 98–111)
Creatinine, Ser: 1.58 mg/dL — ABNORMAL HIGH (ref 0.61–1.24)
GFR, Estimated: 48 mL/min — ABNORMAL LOW (ref >60)
Glucose, Bld: 122 mg/dL — ABNORMAL HIGH (ref 70–99)
Potassium: 3.7 mmol/L (ref 3.5–5.1)
Sodium: 138 mmol/L (ref 135–145)

## 2024-04-15 LAB — CBC
HCT: 38.2 % — ABNORMAL LOW (ref 39.0–52.0)
Hemoglobin: 12.8 g/dL — ABNORMAL LOW (ref 13.0–17.0)
MCH: 30 pg (ref 26.0–34.0)
MCHC: 33.5 g/dL (ref 30.0–36.0)
MCV: 89.5 fL (ref 80.0–100.0)
Platelets: 132 10*3/uL — ABNORMAL LOW (ref 150–400)
RBC: 4.27 MIL/uL (ref 4.22–5.81)
RDW: 13.5 % (ref 11.5–15.5)
WBC: 8.8 10*3/uL (ref 4.0–10.5)
nRBC: 0 % (ref 0.0–0.2)

## 2024-04-15 LAB — GLUCOSE, CAPILLARY: Glucose-Capillary: 105 mg/dL — ABNORMAL HIGH (ref 70–99)

## 2024-04-15 MED ORDER — ATORVASTATIN CALCIUM 80 MG PO TABS
80.0000 mg | ORAL_TABLET | Freq: Every day | ORAL | 3 refills | Status: DC
  Filled 2024-04-15: qty 30, 30d supply, fill #0

## 2024-04-15 MED ORDER — FUROSEMIDE 40 MG PO TABS
40.0000 mg | ORAL_TABLET | Freq: Two times a day (BID) | ORAL | 3 refills | Status: DC
  Filled 2024-04-15: qty 60, 30d supply, fill #0

## 2024-04-15 MED ORDER — ASPIRIN 81 MG PO TBEC
81.0000 mg | DELAYED_RELEASE_TABLET | Freq: Every day | ORAL | 1 refills | Status: DC
  Filled 2024-04-15: qty 30, 30d supply, fill #0

## 2024-04-15 MED ORDER — POLYETHYLENE GLYCOL 3350 17 GM/SCOOP PO POWD
17.0000 g | Freq: Every day | ORAL | 0 refills | Status: DC
  Filled 2024-04-15: qty 238, 14d supply, fill #0

## 2024-04-15 MED ORDER — RIVAROXABAN 20 MG PO TABS
20.0000 mg | ORAL_TABLET | Freq: Every day | ORAL | 3 refills | Status: DC
  Filled 2024-04-15: qty 30, 30d supply, fill #0

## 2024-04-15 MED ORDER — SENNOSIDES-DOCUSATE SODIUM 8.6-50 MG PO TABS
1.0000 | ORAL_TABLET | Freq: Two times a day (BID) | ORAL | 0 refills | Status: AC
  Filled 2024-04-15: qty 10, 5d supply, fill #0

## 2024-04-15 MED ORDER — METOPROLOL SUCCINATE ER 25 MG PO TB24
25.0000 mg | ORAL_TABLET | Freq: Every day | ORAL | 3 refills | Status: DC
  Filled 2024-04-15: qty 30, 30d supply, fill #0

## 2024-04-15 MED ORDER — LOSARTAN POTASSIUM 25 MG PO TABS
25.0000 mg | ORAL_TABLET | Freq: Every day | ORAL | 2 refills | Status: DC
  Filled 2024-04-15: qty 30, 30d supply, fill #0

## 2024-04-15 MED ORDER — POLYETHYLENE GLYCOL 3350 17 G PO PACK
17.0000 g | PACK | Freq: Every day | ORAL | Status: DC
  Administered 2024-04-15: 17 g via ORAL
  Filled 2024-04-15: qty 1

## 2024-04-15 MED ORDER — DAPAGLIFLOZIN PROPANEDIOL 10 MG PO TABS
10.0000 mg | ORAL_TABLET | Freq: Every day | ORAL | 3 refills | Status: DC
  Filled 2024-04-15: qty 14, 14d supply, fill #0

## 2024-04-15 MED ORDER — FUROSEMIDE 40 MG PO TABS
40.0000 mg | ORAL_TABLET | Freq: Two times a day (BID) | ORAL | Status: DC
  Administered 2024-04-15: 40 mg via ORAL
  Filled 2024-04-15: qty 1

## 2024-04-15 MED ORDER — DAPAGLIFLOZIN PROPANEDIOL 10 MG PO TABS
10.0000 mg | ORAL_TABLET | Freq: Every day | ORAL | 3 refills | Status: DC
  Filled 2024-04-15: qty 30, 30d supply, fill #0

## 2024-04-15 MED ORDER — RIVAROXABAN 20 MG PO TABS
20.0000 mg | ORAL_TABLET | Freq: Every day | ORAL | 3 refills | Status: DC
Start: 2024-04-15 — End: 2024-05-05
  Filled 2024-04-15: qty 14, 14d supply, fill #0

## 2024-04-15 MED ORDER — SENNOSIDES-DOCUSATE SODIUM 8.6-50 MG PO TABS
1.0000 | ORAL_TABLET | Freq: Two times a day (BID) | ORAL | Status: DC
  Administered 2024-04-15: 1 via ORAL
  Filled 2024-04-15: qty 1

## 2024-04-15 NOTE — Progress Notes (Signed)
 Rounding Note   Patient Name: Pedro Kemp Date of Encounter: 04/15/2024  [facility] Cardiologist: Croitoru  Subjective No complaints  Scheduled Meds:  aspirin  EC  81 mg Oral Daily   atorvastatin   80 mg Oral Daily   dapagliflozin  propanediol  10 mg Oral QAC breakfast   furosemide   40 mg Intravenous BID   insulin  aspart  0-6 Units Subcutaneous TID AC & HS   losartan   25 mg Oral Daily   metoprolol  succinate  25 mg Oral Daily   rivaroxaban   20 mg Oral Q supper   Continuous Infusions:  PRN Meds:    Vital Signs  Vitals:   04/14/24 1936 04/14/24 2000 04/15/24 0018 04/15/24 0414  BP: (!) 119/54  116/67 110/64  Pulse: 60  60 60  Resp: 17 19 19 15   Temp: 97.6 F (36.4 C)  98.2 F (36.8 C) 97.8 F (36.6 C)  TempSrc: Oral  Oral Oral  SpO2: 93% 94% 97% 94%  Weight:      Height:        Intake/Output Summary (Last 24 hours) at 04/15/2024 0711 Last data filed at 04/15/2024 0414 Gross per 24 hour  Intake 844 ml  Output 2250 ml  Net -1406 ml      04/13/2024    5:35 PM 01/08/2024    4:33 AM 01/07/2024    7:43 PM  Last 3 Weights  Weight (lbs) 149 lb 0.5 oz 156 lb 9.6 oz 149 lb 3.2 oz  Weight (kg) 67.6 kg 71.033 kg 67.677 kg      Telemetry NSR - Personally Reviewed  ECG  N/a - Personally Reviewed  Physical Exam  GEN: No acute distress.   Neck: No JVD Cardiac: RRR, no murmurs, rubs, or gallops.  Respiratory: Clear to auscultation bilaterally. GI: Soft, nontender, non-distended  MS: No edema; No deformity. Neuro:  Nonfocal  Psych: Normal affect   Labs High Sensitivity Troponin:   Recent Labs  Lab 04/13/24 0222 04/13/24 0415  TROPONINIHS 3,280* 3,494*     Chemistry Recent Labs  Lab 04/13/24 0222 04/13/24 1027 04/13/24 2351 04/14/24 0912 04/15/24 0227  NA 139 139 137 135 138  K 4.4 4.8 4.0 4.4 3.7  CL 110 110 102 105 106  CO2 20* 20* 23 23 25   GLUCOSE 188* 122* 154* 94 122*  BUN 31* 32* 34* 32* 43*  CREATININE 1.59* 1.33* 1.37*  1.37* 1.58*  CALCIUM  8.5* 8.4* 8.2* 8.3* 8.5*  MG  --  2.4  --   --   --   PROT 6.8 6.3*  --   --   --   ALBUMIN 3.8 3.5  --   --   --   AST 52* 45*  --   --   --   ALT 56* 48*  --   --   --   ALKPHOS 88 69  --   --   --   BILITOT 0.9 1.0  --   --   --   GFRNONAA 47* 59* 57* 57* 48*  ANIONGAP 9 9 12 7 7     Lipids No results for input(s): CHOL, TRIG, HDL, LABVLDL, LDLCALC, CHOLHDL in the last 168 hours.  Hematology Recent Labs  Lab 04/13/24 1027 04/14/24 0912 04/15/24 0227  WBC 11.2* 10.4 8.8  RBC 4.22 4.19* 4.27  HGB 12.7* 12.8* 12.8*  HCT 38.5* 37.7* 38.2*  MCV 91.2 90.0 89.5  MCH 30.1 30.5 30.0  MCHC 33.0 34.0 33.5  RDW 13.7 13.6 13.5  PLT 124*  121* 132*   Thyroid  No results for input(s): TSH, FREET4 in the last 168 hours.  BNP Recent Labs  Lab 04/13/24 0222  BNP 647.5*    DDimer No results for input(s): DDIMER in the last 168 hours.   Radiology  ECHOCARDIOGRAM COMPLETE Result Date: 04/13/2024    ECHOCARDIOGRAM REPORT   Patient Name:   [name] Date of Exam: 04/13/2024 Medical Rec #:  [id number]         Height:       64.0 in Accession #:    [id number]        Weight:       156.6 lb Date of Birth:  21-May-1956         BSA:          1.763 m Patient Age:    68 years          BP:           127/72 mmHg Patient Gender: M                 HR:           65 bpm. Exam Location:  Inpatient Procedure: 2D Echo, Color Doppler and Cardiac Doppler (Both Spectral and Color            Flow Doppler were utilized during procedure). Indications:    Elevated troponin  History:        Patient has prior history of Echocardiogram examinations, most                 recent 10/24/2022.  Sonographer:    Benard Stallion Referring Phys: [id number] TAYLOR A PARCELLS IMPRESSIONS  1. Left ventricular ejection fraction, by estimation, is 25 to 30%. Left ventricular ejection fraction by 2D MOD biplane is 25.1 %. The left ventricle has severely decreased function. The left ventricle  demonstrates regional wall motion abnormalities (see scoring diagram/findings for description). The left ventricular internal cavity size was mildly dilated. Left ventricular diastolic parameters are consistent with Grade II diastolic dysfunction (pseudonormalization). Elevated left atrial pressure.  2. Right ventricular systolic function is low normal. The right ventricular size is normal. There is normal pulmonary artery systolic pressure.  3. Left atrial size was severely dilated.  4. The mitral valve is normal in structure. Mild mitral valve regurgitation. No evidence of mitral stenosis.  5. The aortic valve is tricuspid. Aortic valve regurgitation is not visualized. No aortic stenosis is present.  6. The inferior vena cava is normal in size with greater than 50% respiratory variability, suggesting right atrial pressure of 3 mmHg. FINDINGS  Left Ventricle: Left ventricular ejection fraction, by estimation, is 25 to 30%. Left ventricular ejection fraction by 2D MOD biplane is 25.1 %. The left ventricle has severely decreased function. The left ventricle demonstrates regional wall motion abnormalities. The left ventricular internal cavity size was mildly dilated. There is no left ventricular hypertrophy. Left ventricular diastolic parameters are consistent with Grade II diastolic dysfunction (pseudonormalization). Elevated left atrial pressure.  LV Wall Scoring: Akinetic anterior, anteroseptal, and anterolateral walls. Right Ventricle: The right ventricular size is normal. No increase in right ventricular wall thickness. Right ventricular systolic function is low normal. There is normal pulmonary artery systolic pressure. The tricuspid regurgitant velocity is 2.52 m/s,  and with an assumed right atrial pressure of 3 mmHg, the estimated right ventricular systolic pressure is 28.4 mmHg. Left Atrium: Left atrial size was severely dilated. Right Atrium: Right atrial size was normal in size. Pericardium: There is  no  evidence of pericardial effusion. Mitral Valve: The mitral valve is normal in structure. Mild mitral valve regurgitation. No evidence of mitral valve stenosis. MV peak gradient, 5.2 mmHg. The mean mitral valve gradient is 2.0 mmHg. Tricuspid Valve: The tricuspid valve is normal in structure. Tricuspid valve regurgitation is mild . No evidence of tricuspid stenosis. Aortic Valve: The aortic valve is tricuspid. Aortic valve regurgitation is not visualized. No aortic stenosis is present. Aortic valve mean gradient measures 2.0 mmHg. Aortic valve peak gradient measures 4.1 mmHg. Aortic valve area, by VTI measures 1.92 cm. Pulmonic Valve: The pulmonic valve was normal in structure. Pulmonic valve regurgitation is not visualized. No evidence of pulmonic stenosis. Aorta: The aortic root is normal in size and structure. Venous: The inferior vena cava is normal in size with greater than 50% respiratory variability, suggesting right atrial pressure of 3 mmHg. IAS/Shunts: No atrial level shunt detected by color flow Doppler. Additional Comments: A device lead is visualized.  LEFT VENTRICLE PLAX 2D                        Biplane EF (MOD) LVIDd:         6.10 cm         LV Biplane EF:   Left LVIDs:         5.30 cm                          ventricular LV PW:         1.00 cm                          ejection LV IVS:        1.00 cm                          fraction by LVOT diam:     2.00 cm                          2D MOD LV SV:         42                               biplane is LV SV Index:   24                               25.1 %. LVOT Area:     3.14 cm                                Diastology                                LV e' medial:    5.44 cm/s LV Volumes (MOD)               LV E/e' medial:  22.1 LV vol d, MOD    137.0 ml      LV e' lateral:   6.53 cm/s A2C:  LV E/e' lateral: 18.4 LV vol d, MOD    150.0 ml A4C: LV vol s, MOD    100.0 ml A2C: LV vol s, MOD    113.0 ml A4C: LV SV MOD A2C:   37.0  ml LV SV MOD A4C:   150.0 ml LV SV MOD BP:    37.4 ml RIGHT VENTRICLE RV Basal diam:  3.30 cm RV Mid diam:    2.50 cm RV S prime:     10.10 cm/s TAPSE (M-mode): 2.0 cm LEFT ATRIUM              Index        RIGHT ATRIUM           Index LA diam:        4.80 cm  2.72 cm/m   RA Area:     15.30 cm LA Vol (A2C):   113.0 ml 64.09 ml/m  RA Volume:   39.80 ml  22.57 ml/m LA Vol (A4C):   100.0 ml 56.72 ml/m LA Biplane Vol: 109.0 ml 61.82 ml/m  AORTIC VALVE AV Area (Vmax):    2.06 cm AV Area (Vmean):   1.90 cm AV Area (VTI):     1.92 cm AV Vmax:           101.00 cm/s AV Vmean:          67.900 cm/s AV VTI:            0.219 m AV Peak Grad:      4.1 mmHg AV Mean Grad:      2.0 mmHg LVOT Vmax:         66.10 cm/s LVOT Vmean:        41.100 cm/s LVOT VTI:          0.134 m LVOT/AV VTI ratio: 0.61  AORTA Ao Root diam: 3.10 cm Ao Asc diam:  3.20 cm MITRAL VALVE                TRICUSPID VALVE MV Area (PHT): 5.66 cm     TR Peak grad:   25.4 mmHg MV Area VTI:   1.13 cm     TR Vmax:        252.00 cm/s MV Peak grad:  5.2 mmHg MV Mean grad:  2.0 mmHg     SHUNTS MV Vmax:       1.14 m/s     Systemic VTI:  0.13 m MV Vmean:      58.2 cm/s    Systemic Diam: 2.00 cm MV Decel Time: 134 msec MR Peak grad: 50.4 mmHg MR Vmax:      355.00 cm/s MV E velocity: 120.00 cm/s MV A velocity: 54.40 cm/s MV E/A ratio:  2.21 Morene Brownie Electronically signed by Morene Brownie Signature Date/Time: 04/13/2024/2:26:44 PM    Final     Cardiac Studies   Patient Profile   68 y.o. male with a hx of ischemic cardiomyopathy with reduced EF, s/p [employer] ICD post VT arrest in 2022, paroxsymal atrial fibrillation on Xarelto , CAD s/p DES to LAD, D1 (04/2021 at [facility]), hypertension, type 2 diabetes, MR who is being seen 04/13/2024 for the evaluation of NSTEMI at the request of Dr. Franky.   Assessment & Plan  1.CAD - history of prior DES to LAD, D1 in 04/2021 at [facility] - elevated trop on admission, from notes thought to be secondary to heart failure  and not ACS - with recent cath 08/2023 with no PCI targets, no angina, stable  LVEF there has not been plans for repeat cath this admission - 08/2023 cath high grade mid LCX that is small vessel, high grade diag disease. No PCI targets.  - continue medical therapy ASA 81, atorva 80, losartan  25, toprol  25  2. Acute on chronic HFrEF - 08/2023 echo: LVEF 30-35% - 04/13/24 echo: LVEF 25-30%, grade II dd, low normal RV function - CXR + edema, BNP 647 - exacerbation due to medication noncompliance - he is on IV lasix  40mg  bid, I/Os incomplete. Weight is pending - appears euvolemic, denies any ongoing symptoms. Change to oral lasix  40mg  bid  - medical therapy with farxiga  10, losartan  25, toprol  25.  - previously had hyperkalemia on aldactone , now off.  - had been on entresto , unclear from notes so far when this was changed to losartan . Review records further   3. PAF - continue toprol , xarelto   Ambulate patient, if does well would be ok for discharge today. Already has f/u July 17  For questions or updates, please contact [facility] Please consult [email or web address] for contact info under     Signed, Alvan Carrier, MD  04/15/2024, 7:11 AM

## 2024-04-15 NOTE — Discharge Summary (Addendum)
 Physician Discharge Summary   Patient: Pedro Kemp MRN: [id number] DOB: Feb 05, 1956  Admit date:     04/13/2024  Discharge date: 04/15/24  Discharge Physician: Owen DELENA Lore   PCP: Celestia Rosaline SQUIBB, NP   Recommendations at discharge:   Needs follow up with cardiology on 7/18. Needs B-met to follow renal function Resume Metformin  if renal function allows it  Discharge Diagnoses: Active Problems:   Acute on chronic HFrEF (heart failure with reduced ejection fraction) (HCC)   Paroxysmal atrial fibrillation (HCC)   Type 2 diabetes mellitus with hyperglycemia, without long-term current use of insulin  (HCC)   history of VT arrest   History of cardiac arrest   ICD (implantable cardioverter-defibrillator) in place   Ischemic cardiomyopathy  Resolved Problems:   * No resolved hospital problems. *  Hospital Course: 68 year old past medical history of non-STEMI status post PCI to LAD at [facility], chronic heart failure reduced ejection fraction, history of VT arrest status post ICD placement, diabetes type 2, CKD stage II, A-fib presented to the ED complaining of shortness of breath.   Evaluation in the ED chest x-ray with effusion concerning for heart failure, received IV Lasix , BNP 647, troponin 3200.  EKG sinus rhythm.  Cardiology has been consulted.  Patient was started on heparin  drip.      Assessment and Plan: 1-CAD elevation troponin, demand ischemia in setting HF exacerbation.  - Patient presented with dyspnea found to have troponin 3000 range -Treated with  heparin  drip--discontinue by cardiology  -Cardiology  consulted, recommend medical management.  -Continue with metoprolol , aspirin  and statins.    Acute on chronic heart failure reduced ejection fraction, history of VT arrest status post AICD: - Patient presented with shortness of breath, chest x-ray with pulmonary edema.  Received a dose of IV Lasix . -Started on Farxiga , Cozaar .  -Negative 1.1 L -feeling  better.  Transition to oral lasix  40 mg BID.  Stable for discharge per cardiology    CKD stage II -last GFR was at 60 -monitor renal function.  -Cr baseline 1.3   Diabetes type 2: -hold metformin .  -SSI.  -he will need medications refill   Paroxysmal A-fib: -Continue with metoprolol .  Xarelto  resume today    Leukocytosis;  Trending down.    Estimated body mass index is 26.4 kg/m as calculated from the following:   Height as of this encounter: 5' 3 (1.6 m).   Weight as of this encounter: 67.6 kg.          Consultants: Cardiology  Disposition: Home Diet recommendation:  Discharge Diet Orders (From admission, onward)     Start     Ordered   04/15/24 0000  Diet - low sodium heart healthy        04/15/24 0850   04/15/24 0000  Diet Carb Modified        04/15/24 0850           Carb modified diet DISCHARGE MEDICATION: Allergies as of 04/15/2024   No Known Allergies      Medication List     STOP taking these medications    metFORMIN  500 MG 24 hr tablet Commonly known as: GLUCOPHAGE -XR       TAKE these medications    aspirin  EC 81 MG tablet Take 1 tablet (81 mg total) by mouth daily. Swallow whole.   atorvastatin  80 MG tablet Commonly known as: LIPITOR  Tome 1 tableta (80 mg en total) por va oral diariamente. (Take 1 tablet (80 mg total) by mouth daily.)  Farxiga  10 MG Tabs tablet Generic drug: dapagliflozin  propanediol Tome 1 tableta (10 mg en total) por va oral diariamente antes de desayunar. (Take 1 tablet (10 mg total) by mouth daily before breakfast.)   furosemide  40 MG tablet Commonly known as: LASIX  Take 1 tablet (40 mg total) by mouth 2 (two) times daily.   losartan  25 MG tablet Commonly known as: Cozaar  Tome 1 tableta (25 mg en total) por va oral diariamente. (Take 1 tablet (25 mg total) by mouth daily.)   metoprolol  succinate 25 MG 24 hr tablet Commonly known as: TOPROL -XL Tome 1 tableta (25 mg en total) por va oral  diariamente. (Take 1 tablet (25 mg total) by mouth daily.)   polyethylene glycol powder 17 GM/SCOOP powder Commonly known as: GLYCOLAX /MIRALAX  Take 17 g by mouth daily.   Senna-S 8.6-50 MG tablet Generic drug: senna-docusate Take 1 tablet by mouth 2 (two) times daily for 5 days.   Xarelto  20 MG Tabs tablet Generic drug: rivaroxaban  Tome 1 tableta (20 mg en total) por va oral diariamente con alimentos. (Take 1 tablet (20 mg total) by mouth daily wtih food.)        Follow-up Information     Croitoru, Mihai, MD. Go on 05/05/2024.   Specialty: Cardiology Why: Vaya a la cita con cardiologo Julio 18 a las dos de laterde. Contact information: [address] [place] [phone]                Discharge Exam: Fredricka Weights   04/13/24 1735  Weight: 67.6 kg   General; NAD  Condition at discharge: stable  The results of significant diagnostics from this hospitalization (including imaging, microbiology, ancillary and laboratory) are listed below for reference.   Imaging Studies: ECHOCARDIOGRAM COMPLETE Result Date: 04/13/2024    ECHOCARDIOGRAM REPORT   Patient Name:   Pedro Kemp Date of Exam: 04/13/2024 Medical Rec #:  [id number]         Height:       64.0 in Accession #:    [id number]        Weight:       156.6 lb Date of Birth:  13-Nov-1955         BSA:          1.763 m Patient Age:    68 years          BP:           127/72 mmHg Patient Gender: M                 HR:           65 bpm. Exam Location:  Inpatient Procedure: 2D Echo, Color Doppler and Cardiac Doppler (Both Spectral and Color            Flow Doppler were utilized during procedure). Indications:    Elevated troponin  History:        Patient has prior history of Echocardiogram examinations, most                 recent 10/24/2022.  Sonographer:    Benard Stallion Referring Phys: [id number] TAYLOR A PARCELLS IMPRESSIONS  1. Left ventricular ejection fraction, by estimation, is 25 to 30%. Left  ventricular ejection fraction by 2D MOD biplane is 25.1 %. The left ventricle has severely decreased function. The left ventricle demonstrates regional wall motion abnormalities (see scoring diagram/findings for description). The left ventricular internal cavity size was mildly dilated. Left ventricular diastolic parameters are consistent with Grade  II diastolic dysfunction (pseudonormalization). Elevated left atrial pressure.  2. Right ventricular systolic function is low normal. The right ventricular size is normal. There is normal pulmonary artery systolic pressure.  3. Left atrial size was severely dilated.  4. The mitral valve is normal in structure. Mild mitral valve regurgitation. No evidence of mitral stenosis.  5. The aortic valve is tricuspid. Aortic valve regurgitation is not visualized. No aortic stenosis is present.  6. The inferior vena cava is normal in size with greater than 50% respiratory variability, suggesting right atrial pressure of 3 mmHg. FINDINGS  Left Ventricle: Left ventricular ejection fraction, by estimation, is 25 to 30%. Left ventricular ejection fraction by 2D MOD biplane is 25.1 %. The left ventricle has severely decreased function. The left ventricle demonstrates regional wall motion abnormalities. The left ventricular internal cavity size was mildly dilated. There is no left ventricular hypertrophy. Left ventricular diastolic parameters are consistent with Grade II diastolic dysfunction (pseudonormalization). Elevated left atrial pressure.  LV Wall Scoring: Akinetic anterior, anteroseptal, and anterolateral walls. Right Ventricle: The right ventricular size is normal. No increase in right ventricular wall thickness. Right ventricular systolic function is low normal. There is normal pulmonary artery systolic pressure. The tricuspid regurgitant velocity is 2.52 m/s,  and with an assumed right atrial pressure of 3 mmHg, the estimated right ventricular systolic pressure is 28.4 mmHg.  Left Atrium: Left atrial size was severely dilated. Right Atrium: Right atrial size was normal in size. Pericardium: There is no evidence of pericardial effusion. Mitral Valve: The mitral valve is normal in structure. Mild mitral valve regurgitation. No evidence of mitral valve stenosis. MV peak gradient, 5.2 mmHg. The mean mitral valve gradient is 2.0 mmHg. Tricuspid Valve: The tricuspid valve is normal in structure. Tricuspid valve regurgitation is mild . No evidence of tricuspid stenosis. Aortic Valve: The aortic valve is tricuspid. Aortic valve regurgitation is not visualized. No aortic stenosis is present. Aortic valve mean gradient measures 2.0 mmHg. Aortic valve peak gradient measures 4.1 mmHg. Aortic valve area, by VTI measures 1.92 cm. Pulmonic Valve: The pulmonic valve was normal in structure. Pulmonic valve regurgitation is not visualized. No evidence of pulmonic stenosis. Aorta: The aortic root is normal in size and structure. Venous: The inferior vena cava is normal in size with greater than 50% respiratory variability, suggesting right atrial pressure of 3 mmHg. IAS/Shunts: No atrial level shunt detected by color flow Doppler. Additional Comments: A device lead is visualized.  LEFT VENTRICLE PLAX 2D                        Biplane EF (MOD) LVIDd:         6.10 cm         LV Biplane EF:   Left LVIDs:         5.30 cm                          ventricular LV PW:         1.00 cm                          ejection LV IVS:        1.00 cm                          fraction by LVOT diam:     2.00 cm  2D MOD LV SV:         42                               biplane is LV SV Index:   24                               25.1 %. LVOT Area:     3.14 cm                                Diastology                                LV e' medial:    5.44 cm/s LV Volumes (MOD)               LV E/e' medial:  22.1 LV vol d, MOD    137.0 ml      LV e' lateral:   6.53 cm/s A2C:                           LV E/e'  lateral: 18.4 LV vol d, MOD    150.0 ml A4C: LV vol s, MOD    100.0 ml A2C: LV vol s, MOD    113.0 ml A4C: LV SV MOD A2C:   37.0 ml LV SV MOD A4C:   150.0 ml LV SV MOD BP:    37.4 ml RIGHT VENTRICLE RV Basal diam:  3.30 cm RV Mid diam:    2.50 cm RV S prime:     10.10 cm/s TAPSE (M-mode): 2.0 cm LEFT ATRIUM              Index        RIGHT ATRIUM           Index LA diam:        4.80 cm  2.72 cm/m   RA Area:     15.30 cm LA Vol (A2C):   113.0 ml 64.09 ml/m  RA Volume:   39.80 ml  22.57 ml/m LA Vol (A4C):   100.0 ml 56.72 ml/m LA Biplane Vol: 109.0 ml 61.82 ml/m  AORTIC VALVE AV Area (Vmax):    2.06 cm AV Area (Vmean):   1.90 cm AV Area (VTI):     1.92 cm AV Vmax:           101.00 cm/s AV Vmean:          67.900 cm/s AV VTI:            0.219 m AV Peak Grad:      4.1 mmHg AV Mean Grad:      2.0 mmHg LVOT Vmax:         66.10 cm/s LVOT Vmean:        41.100 cm/s LVOT VTI:          0.134 m LVOT/AV VTI ratio: 0.61  AORTA Ao Root diam: 3.10 cm Ao Asc diam:  3.20 cm MITRAL VALVE                TRICUSPID VALVE MV Area (PHT): 5.66 cm     TR Peak grad:   25.4 mmHg MV Area VTI:   1.13 cm  TR Vmax:        252.00 cm/s MV Peak grad:  5.2 mmHg MV Mean grad:  2.0 mmHg     SHUNTS MV Vmax:       1.14 m/s     Systemic VTI:  0.13 m MV Vmean:      58.2 cm/s    Systemic Diam: 2.00 cm MV Decel Time: 134 msec MR Peak grad: 50.4 mmHg MR Vmax:      355.00 cm/s MV E velocity: 120.00 cm/s MV A velocity: 54.40 cm/s MV E/A ratio:  2.21 Morene Brownie Electronically signed by Morene Brownie Signature Date/Time: 04/13/2024/2:26:44 PM    Final    DG Chest Portable 1 View Result Date: 04/13/2024 CLINICAL DATA:  Shortness of breath EXAM: PORTABLE CHEST 1 VIEW COMPARISON:  01/07/2024 FINDINGS: Cardiac shadow is stable. Defibrillator is again seen. Increased central vascular congestion is noted with mild interstitial edema. No effusions are noted. No bony is seen. IMPRESSION: Changes consistent with CHF with interstitial edema.  Electronically Signed   By: Oneil Devonshire M.D.   On: 04/13/2024 03:07    Microbiology: Results for orders placed or performed during the hospital encounter of 04/13/24  MRSA Next Gen by PCR, Nasal     Status: None   Collection Time: 04/13/24  6:03 PM   Specimen: Nasal Mucosa; Nasal Swab  Result Value Ref Range Status   MRSA by PCR Next Gen NOT DETECTED NOT DETECTED Final    Comment: (NOTE) The GeneXpert MRSA Assay (FDA approved for NASAL specimens only), is one component of a comprehensive MRSA colonization surveillance program. It is not intended to diagnose MRSA infection nor to guide or monitor treatment for MRSA infections. Test performance is not FDA approved in patients less than 37 years old. Performed at [facility], [address] [place], [place] [address]     Labs: CBC: Recent Labs  Lab 04/13/24 0222 04/13/24 1027 04/14/24 0912 04/15/24 0227  WBC 14.8* 11.2* 10.4 8.8  NEUTROABS 7.7 8.2*  --   --   HGB 13.3 12.7* 12.8* 12.8*  HCT 41.0 38.5* 37.7* 38.2*  MCV 93.6 91.2 90.0 89.5  PLT 127* 124* 121* 132*   Basic Metabolic Panel: Recent Labs  Lab 04/13/24 0222 04/13/24 1027 04/13/24 2351 04/14/24 0912 04/15/24 0227  NA 139 139 137 135 138  K 4.4 4.8 4.0 4.4 3.7  CL 110 110 102 105 106  CO2 20* 20* 23 23 25   GLUCOSE 188* 122* 154* 94 122*  BUN 31* 32* 34* 32* 43*  CREATININE 1.59* 1.33* 1.37* 1.37* 1.58*  CALCIUM  8.5* 8.4* 8.2* 8.3* 8.5*  MG  --  2.4  --   --   --    Liver Function Tests: Recent Labs  Lab 04/13/24 0222 04/13/24 1027  AST 52* 45*  ALT 56* 48*  ALKPHOS 88 69  BILITOT 0.9 1.0  PROT 6.8 6.3*  ALBUMIN 3.8 3.5   CBG: Recent Labs  Lab 04/14/24 0803 04/14/24 1135 04/14/24 1619 04/14/24 2108 04/15/24 0603  GLUCAP 89 180* 128* 216* 105*    Discharge time spent: greater than 30 minutes.  Signed: Owen DELENA Lore, MD [facility] 04/15/2024

## 2024-04-15 NOTE — Progress Notes (Signed)
 Went over discharge paperwork in spanish and answered all questions. PIV and telemetry removed. All belongings at bedside.

## 2024-04-17 ENCOUNTER — Telehealth: Payer: Self-pay | Admitting: *Deleted

## 2024-04-17 NOTE — Transitions of Care (Post Inpatient/ED Visit) (Signed)
   04/17/2024  Name: Pedro Kemp MRN: [id number] DOB: 25-Oct-1955  Today's TOC FU Call Status: Today's TOC FU Call Status:: Unsuccessful Call (1st Attempt) Unsuccessful Call (1st Attempt) Date: 04/17/24  Attempted to reach the patient regarding the most recent Inpatient/ED visit.  Follow Up Plan: Additional outreach attempts will be made to reach the patient to complete the Transitions of Care (Post Inpatient/ED visit) call.   Andrea Dimes RN, BSN [place]  [facility] RN Care Manager [phone]

## 2024-04-18 ENCOUNTER — Telehealth: Payer: Self-pay | Admitting: *Deleted

## 2024-04-18 NOTE — Transitions of Care (Post Inpatient/ED Visit) (Signed)
   04/18/2024  Name: Pedro Kemp MRN: [id number] DOB: January 17, 1956  Today's TOC FU Call Status: Today's TOC FU Call Status:: Unsuccessful Call (2nd Attempt) Unsuccessful Call (2nd Attempt) Date: 04/18/24  Attempted to reach the patient regarding the most recent Inpatient/ED visit.  Follow Up Plan: Additional outreach attempts will be made to reach the patient to complete the Transitions of Care (Post Inpatient/ED visit) call.   Andrea Dimes RN, BSN [place]  [facility] RN Care Manager [phone]

## 2024-04-19 ENCOUNTER — Ambulatory Visit (INDEPENDENT_AMBULATORY_CARE_PROVIDER_SITE_OTHER): Payer: Self-pay | Admitting: Primary Care

## 2024-04-19 ENCOUNTER — Telehealth: Payer: Self-pay | Admitting: *Deleted

## 2024-04-19 NOTE — Transitions of Care (Post Inpatient/ED Visit) (Signed)
   04/19/2024  Name: Pedro Kemp MRN: [id number] DOB: [birth year]  Today's TOC FU Call Status: Today's TOC FU Call Status:: Unsuccessful Call (3rd Attempt) Unsuccessful Call (3rd Attempt) Date: 04/19/24  Attempted to reach the patient regarding the most recent Inpatient/ED visit.  Follow Up Plan: No further outreach attempts will be made at this time. We have been unable to contact the patient.  Andrea Dimes RN, BSN [place]  [facility] RN Care Manager [phone]

## 2024-04-28 NOTE — Telephone Encounter (Signed)
 Tried to call patient with interrupter and he did not answer

## 2024-05-04 ENCOUNTER — Ambulatory Visit (INDEPENDENT_AMBULATORY_CARE_PROVIDER_SITE_OTHER): Payer: Self-pay | Admitting: Primary Care

## 2024-05-04 NOTE — Progress Notes (Signed)
 Cardiology Office Note:    Date:  05/10/2024   ID:  Pedro Kemp, DOB 1956-06-19, MRN [id number]  PCP:  Celestia Rosaline SQUIBB, NP   [facility] Providers Cardiologist:  None     Referring MD: Celestia Rosaline SQUIBB, NP   No chief complaint on file.   History of Present Illness:    Pedro Kemp is a 68 y.o. 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   A Spanish translator was present.  He feels well right now and has no complaints of shortness of breath, chest pain or dizziness he has no edema.  He does have difficulty at work because of the work environment is very hot.  This makes him feel very fatigued.  He was hospitalized on 04/13/2024 due to heart failure exacerbation, after running out of his medications for about 10 days.  Labs showed evidence of small non-STEMI with a peak troponin of roughly 3500.  His BNP was elevated at 647 and his chest x-ray showed interstitial pulmonary edema.  He did not have any angina pectoris and his ECG did not show any acute changes.  His echocardiogram showed unchanged LVEF 25-30% in the same pattern of regional wall motion abnormalities, severely dilated left atrium and evidence of elevated mean left atrial pressure.  He was treated with diuretics.  Losartan  25 mg daily, furosemide  40 mg twice daily, Farxiga  10 mg daily, metoprolol  succinate 25 mg daily, Xarelto  20 mg daily and Lipitor  80 mg daily were all restarted and he was also prescribed aspirin  81 mg daily.  He has demonstrated a tendency to develop hyperkalemia so he is not on spironolactone .  In his discharge summary on 04/15/2024 his weight was documented  as 67.6 kg, but this is actually the weight that was documented at the time of his admission with heart failure.   Creatinine at the time of admission and that the time of discharge was identical at 1.58.  BNP on admission was 647 (previous baseline seems to be around 150).  We are still having issues getting him all the necessary medications.  Filled out of paperwork for assistance programs for Farxiga  and Xarelto  today.  Refilled all his prescriptions for cardiac medications.    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    Past Surgical History:  Procedure Laterality Date   CARDIAC CATHETERIZATION     ICD IMPLANT     RIGHT/LEFT HEART CATH AND CORONARY ANGIOGRAPHY N/A 08/24/2023   Procedure: RIGHT/LEFT HEART CATH AND CORONARY ANGIOGRAPHY;  Surgeon: Wendel Lurena POUR, MD;  Location: [facility] INVASIVE CV LAB;  Service: Cardiovascular;  Laterality: N/A;    Current Medications: Current Meds  Medication Sig   polyethylene glycol powder (GLYCOLAX /MIRALAX ) 17 GM/SCOOP powder Take 17 g by mouth daily.   [DISCONTINUED] aspirin  EC 81 MG tablet Take 1 tablet (81 mg total) by mouth daily. Swallow whole.   [DISCONTINUED] atorvastatin  (LIPITOR ) 80 MG tablet Take 1 tablet (80 mg total) by  mouth daily.   [DISCONTINUED] dapagliflozin  propanediol (FARXIGA ) 10 MG TABS tablet Take 1 tablet (10 mg total) by mouth daily before breakfast.   [DISCONTINUED] furosemide  (LASIX ) 40 MG tablet Take 1 tablet (40 mg total) by mouth 2 (two) times daily.   [DISCONTINUED] losartan  (COZAAR ) 25 MG tablet Take 1 tablet (25 mg total) by mouth daily.   [DISCONTINUED] metoprolol  succinate (TOPROL -XL) 25 MG 24 hr tablet Take 1 tablet (25 mg total) by mouth daily.   [DISCONTINUED] rivaroxaban  (XARELTO )  20 MG TABS tablet Take 1 tablet (20 mg total) by mouth daily wtih food.     Allergies:   Patient has no known allergies.  Family History: The patient's family history includes Diabetes in his brother and mother.   EKGs/Labs/Other Studies Reviewed:    The following studies were reviewed today: Records from hospitalization at [facility] including labs and imaging studies  Echocardiogram 04/13/2024   1. Left ventricular ejection fraction, by estimation, is 25 to 30%. Left  ventricular ejection fraction by 2D MOD biplane is 25.1 %. The left  ventricle has severely decreased function. The left ventricle demonstrates  regional wall motion abnormalities  (see scoring diagram/findings for description). The left ventricular  internal cavity size was mildly dilated. Left ventricular diastolic  parameters are consistent with Grade II diastolic dysfunction  (pseudonormalization). Elevated left atrial pressure.   2. Right ventricular systolic function is low normal. The right  ventricular size is normal. There is normal pulmonary artery systolic  pressure.   3. Left atrial size was severely dilated.   4. The mitral valve is normal in structure. Mild mitral valve  regurgitation. No evidence of mitral stenosis.   5. The aortic valve is tricuspid. Aortic valve regurgitation is not  visualized. No aortic stenosis is present.   6. The inferior vena cava is normal in size with greater than 50%  respiratory variability, suggesting right atrial pressure of 3 mmHg.   EKG:  EKG from 04/13/2024 is personally reviewed and shows normal sinus rhythm, left ventricular hypertrophy and nonspecific intraventricular conduction abnormality (QRS duration 118 ms) with poor anterior R wave progression and Q waves in the leads I and aVL, mild chronic ST segment elevation V1-V3 and ST depression and T wave inversion in I, aVL, V5-V6.   Recent Labs: 01/07/2024: TSH 1.405 04/13/2024: ALT 48; B Natriuretic Peptide 647.5;  Magnesium  2.4 04/15/2024: BUN 43; Creatinine, Ser 1.58; Hemoglobin 12.8; Platelets 132; Potassium 3.7; Sodium 138  Recent Lipid Panel    Component Value Date/Time   CHOL 99 (L) 01/07/2022 1203   TRIG 236 (H) 01/07/2022 1203   HDL 31 (L) 01/07/2022 1203   CHOLHDL 3.2 01/07/2022 1203   LDLCALC 31 01/07/2022 1203     Risk Assessment/Calculations:            Physical Exam:    VS:  BP (!) 122/54 (BP Location: Left Arm, Patient Position: Sitting, Cuff Size: Normal)   Pulse 62   Ht 5' 3 (1.6 m)   Wt 149 lb 12.8 oz (67.9 kg)   SpO2 98%   BMI 26.54 kg/m     Wt Readings from Last 3 Encounters:  05/05/24 149 lb 12.8 oz (67.9 kg)  04/13/24 149 lb 0.5 oz (67.6 kg)  01/08/24 156 lb 9.6 oz (71 kg)      General: Alert, oriented x3, no distress, healthy left subclavian defibrillator site Head: no evidence of trauma, PERRL, EOMI, no exophtalmos or lid lag, no myxedema, no xanthelasma; normal ears, nose and oropharynx Neck:  normal jugular venous pulsations and no hepatojugular reflux; brisk carotid pulses without delay and no carotid bruits Chest: clear to auscultation, no signs of consolidation by percussion or palpation, normal fremitus, symmetrical and full respiratory excursions Cardiovascular: normal position and quality of the apical impulse, regular rhythm, normal first and second heart sounds, no murmurs, rubs or gallops Abdomen: no tenderness or distention, no masses by palpation, no abnormal pulsatility or arterial bruits, normal bowel sounds, no hepatosplenomegaly Extremities: no clubbing, cyanosis or edema; 2+ radial, ulnar and brachial pulses bilaterally; 2+ right femoral, posterior tibial and dorsalis pedis pulses; 2+ left femoral, posterior tibial and dorsalis pedis pulses; no subclavian or femoral bruits Neurological: grossly nonfocal Psych: Normal mood and affect    ASSESSMENT:    1. Chronic combined systolic (congestive) and diastolic (congestive) heart failure (HCC)    2. Coronary artery disease involving native coronary artery of native heart with other form of angina pectoris (HCC)   3. ICD (implantable cardioverter-defibrillator) in place   4. Ventricular tachycardia (HCC)   5. Paroxysmal atrial fibrillation (HCC)   6. Acquired thrombophilia (HCC)   7. Stage 3a chronic kidney disease (HCC)   8. RTA (renal tubular acidosis)   9. Essential hypertension      PLAN:    In order of problems listed above:  CHF: The same pattern is repeating itself as he has had repeated hospitalizations for heart failure exacerbation due to lack of access to medications.  We had to abandon Entresto  and he is receiving losartan .  The only high cost medication still prescribed his Xarelto  and Jardiance .   We talked about the risks of rebound with interruption of beta-blockers.  Explained the purpose of all of his medications to him. CAD: He had a moderate increase in high-sensitivity troponin I due to demand ischemia in the setting of heart failure exacerbation, rather than a true acute coronary event .  He has history of extensive anterior infarction.  EKG suggests anterior aneurysm formation.  He does not have angina pectoris with usual physical activity.  I do not think he should continue aspirin  whilst he is taking an anticoagulant. ICD: Normal device function.   He is engaged in remote downloads through our device clinic. VT: No serious ventricular arrhythmia has been recorded recently.  He had cardiac arrest due to sustained monomorphic VT during his hospitalization in July 2022.  The arrhythmia was inducible at EP study. AFib: His device has recorded a low burden of paroxysmal atrial fibrillation, less than 1%, but with episodes lasting well in excess of 1 hour.  Symptomatic when he has RVR.  Although he probably needs higher dose of beta-blocker for good ventricular rate control, I do not think we can start on higher doses first but will have to titrate up gradually.   CHA2DS2-VASc score is at least 4 (CHF, CAD, HTN, DM).   CKD 3: Most recent creatinine after diuresis is just slightly higher than what we previously estimated to be his baseline creatinine  around 1.3, corresponding to a GFR of 55-60.  He will benefit from SGLT2 inhibitors and ARB to delay the progression of kidney disease in the setting of diabetes. 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  Will not restart spironolactone . HTN: Prefer to treat this with ARB, beta-blocker as they also help with heart failure.      Medication Adjustments/Labs and Tests Ordered: Current medicines are reviewed at length with the  patient today.  Concerns regarding medicines are outlined above.  No orders of the defined types were placed in this encounter.  Meds ordered this encounter  Medications   atorvastatin  (LIPITOR ) 80 MG tablet    Sig: Take 1 tablet (80 mg total) by mouth daily.    Dispense:  90 tablet    Refill:  3   dapagliflozin  propanediol (FARXIGA ) 10 MG TABS tablet    Sig: Take 1 tablet (10 mg total) by mouth daily before breakfast.    Dispense:  90 tablet    Refill:  3   furosemide  (LASIX ) 40 MG tablet    Sig: Take 1 tablet (40 mg total) by mouth 2 (two) times daily.    Dispense:  180 tablet    Refill:  3   losartan  (COZAAR ) 25 MG tablet    Sig: Take 1 tablet (25 mg total) by mouth daily.    Dispense:  90 tablet    Refill:  3   metoprolol  succinate (TOPROL -XL) 25 MG 24 hr tablet    Sig: Take 1 tablet (25 mg total) by mouth daily.    Dispense:  90 tablet    Refill:  3   rivaroxaban  (XARELTO ) 20 MG TABS tablet    Sig: Take 1 tablet (20 mg total) by mouth daily wtih food.    Dispense:  90 tablet    Refill:  3   aspirin  EC 81 MG tablet    Sig: Take 1 tablet (81 mg total) by mouth daily. Swallow whole.    Dispense:  90 tablet    Refill:  3    Patient Instructions  Medication Instructions:  No  changes *If you need a refill on your cardiac medications before your next appointment, please call your pharmacy*  Lab Work: None ordered If you have labs (blood work) drawn today and your tests are completely normal, you will receive your results only by: MyChart Message (if you have MyChart) OR A paper copy in the mail If you have any lab test that is abnormal or we need to change your treatment, we will call you to review the results.  Testing/Procedures: None ordered  Follow-Up: At [facility], you and your health needs are our priority.  As part of our continuing mission to provide you with exceptional heart care, our providers are all part of one team.  This team includes your primary Cardiologist (physician) and Advanced Practice Providers or APPs (Physician Assistants and Nurse Practitioners) who all work together to provide you with the care you need, when you need it.  Your next appointment:    3-4 months  Provider:   Dr Francyne  We recommend signing up for the patient portal called MyChart.  Sign up information is provided on this After Visit Summary.  MyChart is used to connect with patients for Virtual Visits (Telemedicine).  Patients are able to view lab/test results, encounter notes, upcoming appointments, etc.  Non-urgent messages can be sent to your provider as well.   To learn more about what you can do with MyChart, go to [email or web address].          Signed, Jerel Francyne, MD  05/10/2024 5:06 PM     [facility]

## 2024-05-05 ENCOUNTER — Encounter: Payer: Self-pay | Admitting: Cardiovascular Disease

## 2024-05-05 ENCOUNTER — Other Ambulatory Visit: Payer: Self-pay

## 2024-05-05 ENCOUNTER — Ambulatory Visit: Payer: Self-pay | Attending: Cardiovascular Disease | Admitting: Cardiovascular Disease

## 2024-05-05 VITALS — BP 122/54 | HR 62 | Ht 63.0 in | Wt 149.8 lb

## 2024-05-05 DIAGNOSIS — I25118 Atherosclerotic heart disease of native coronary artery with other forms of angina pectoris: Secondary | ICD-10-CM

## 2024-05-05 DIAGNOSIS — N2589 Other disorders resulting from impaired renal tubular function: Secondary | ICD-10-CM

## 2024-05-05 DIAGNOSIS — I5042 Chronic combined systolic (congestive) and diastolic (congestive) heart failure: Secondary | ICD-10-CM

## 2024-05-05 DIAGNOSIS — I10 Essential (primary) hypertension: Secondary | ICD-10-CM

## 2024-05-05 DIAGNOSIS — I480 Paroxysmal atrial fibrillation: Secondary | ICD-10-CM

## 2024-05-05 DIAGNOSIS — N1831 Chronic kidney disease, stage 3a: Secondary | ICD-10-CM

## 2024-05-05 DIAGNOSIS — Z95810 Presence of automatic (implantable) cardiac defibrillator: Secondary | ICD-10-CM

## 2024-05-05 DIAGNOSIS — D6869 Other thrombophilia: Secondary | ICD-10-CM

## 2024-05-05 DIAGNOSIS — I472 Ventricular tachycardia, unspecified: Secondary | ICD-10-CM

## 2024-05-05 MED ORDER — FUROSEMIDE 40 MG PO TABS
40.0000 mg | ORAL_TABLET | Freq: Two times a day (BID) | ORAL | 3 refills | Status: DC
  Filled 2024-05-05 – 2024-05-09 (×2): qty 180, 90d supply, fill #0

## 2024-05-05 MED ORDER — LOSARTAN POTASSIUM 25 MG PO TABS
25.0000 mg | ORAL_TABLET | Freq: Every day | ORAL | 3 refills | Status: DC
  Filled 2024-05-05 – 2024-05-09 (×2): qty 90, 90d supply, fill #0
  Filled 2024-09-11: qty 90, 90d supply, fill #1

## 2024-05-05 MED ORDER — ATORVASTATIN CALCIUM 80 MG PO TABS
80.0000 mg | ORAL_TABLET | Freq: Every day | ORAL | 3 refills | Status: AC
  Filled 2024-05-05 – 2024-05-09 (×2): qty 90, 90d supply, fill #0
  Filled 2024-09-11: qty 90, 90d supply, fill #1

## 2024-05-05 MED ORDER — DAPAGLIFLOZIN PROPANEDIOL 10 MG PO TABS
10.0000 mg | ORAL_TABLET | Freq: Every day | ORAL | 3 refills | Status: DC
  Filled 2024-05-05: qty 30, 30d supply, fill #0

## 2024-05-05 MED ORDER — RIVAROXABAN 20 MG PO TABS
20.0000 mg | ORAL_TABLET | Freq: Every day | ORAL | 3 refills | Status: DC
  Filled 2024-05-05: qty 90, 90d supply, fill #0

## 2024-05-05 MED ORDER — ASPIRIN 81 MG PO TBEC
81.0000 mg | DELAYED_RELEASE_TABLET | Freq: Every day | ORAL | 3 refills | Status: DC
  Filled 2024-05-05 – 2024-05-09 (×2): qty 90, 90d supply, fill #0

## 2024-05-05 MED ORDER — METOPROLOL SUCCINATE ER 25 MG PO TB24
25.0000 mg | ORAL_TABLET | Freq: Every day | ORAL | 3 refills | Status: AC
  Filled 2024-05-05 – 2024-05-09 (×2): qty 90, 90d supply, fill #0
  Filled 2024-09-11: qty 90, 90d supply, fill #1

## 2024-05-05 NOTE — Patient Instructions (Signed)
 Medication Instructions:  No changes *If you need a refill on your cardiac medications before your next appointment, please call your pharmacy*  Lab Work: None ordered If you have labs (blood work) drawn today and your tests are completely normal, you will receive your results only by: MyChart Message (if you have MyChart) OR A paper copy in the mail If you have any lab test that is abnormal or we need to change your treatment, we will call you to review the results.  Testing/Procedures: None ordered  Follow-Up: At [facility], you and your health needs are our priority.  As part of our continuing mission to provide you with exceptional heart care, our providers are all part of one team.  This team includes your primary Cardiologist (physician) and Advanced Practice Providers or APPs (Physician Assistants and Nurse Practitioners) who all work together to provide you with the care you need, when you need it.  Your next appointment:    3-4 months  Provider:   Dr Francyne  We recommend signing up for the patient portal called MyChart.  Sign up information is provided on this After Visit Summary.  MyChart is used to connect with patients for Virtual Visits (Telemedicine).  Patients are able to view lab/test results, encounter notes, upcoming appointments, etc.  Non-urgent messages can be sent to your provider as well.   To learn more about what you can do with MyChart, go to [email or web address].

## 2024-05-05 NOTE — Telephone Encounter (Signed)
 Scanned in media is the provider portion for the farxiga  under FARXIGA  BLANK PROVIDER FORM. Can someone get Dr. Francyne to sign and fax back to [phone] please? Thank you!      Patient came in [place] and filled out application. Patient portion of Application scanned in media.

## 2024-05-05 NOTE — Telephone Encounter (Signed)
 Patient came in [place] and application completed with interrupter. Patient does not have insurance nor any income. Faxed xarelto  application to [employer] assistance

## 2024-05-08 ENCOUNTER — Telehealth (INDEPENDENT_AMBULATORY_CARE_PROVIDER_SITE_OTHER): Payer: Self-pay | Admitting: Primary Care

## 2024-05-08 NOTE — Telephone Encounter (Signed)
 PAP: Patient assistance application for Xarelto  has been approved by PAP Companies: [employer] from 05/08/24 to 05/08/25. Medication should be delivered to PAP Delivery: Home. For further shipping updates, please contact Vicci ODESSIA Vicci ([employer]) at [phone]. Patient ID is: [email or web address]

## 2024-05-08 NOTE — Telephone Encounter (Signed)
 PAP: Application for [name] has been submitted to [employer] ([employer]), via fax

## 2024-05-08 NOTE — Telephone Encounter (Signed)
 Printed form and had provider sign.

## 2024-05-08 NOTE — Telephone Encounter (Signed)
 Called pt to confirm appt. Pt did not answer and LVM

## 2024-05-09 ENCOUNTER — Other Ambulatory Visit: Payer: Self-pay

## 2024-05-09 ENCOUNTER — Ambulatory Visit (INDEPENDENT_AMBULATORY_CARE_PROVIDER_SITE_OTHER): Payer: Self-pay | Admitting: Primary Care

## 2024-05-12 ENCOUNTER — Telehealth: Payer: Self-pay | Admitting: Pharmacy Technician

## 2024-05-19 ENCOUNTER — Ambulatory Visit (INDEPENDENT_AMBULATORY_CARE_PROVIDER_SITE_OTHER): Payer: Self-pay

## 2024-05-19 DIAGNOSIS — I5042 Chronic combined systolic (congestive) and diastolic (congestive) heart failure: Secondary | ICD-10-CM

## 2024-05-19 LAB — CUP PACEART REMOTE DEVICE CHECK
Battery Remaining Longevity: 106 mo
Battery Voltage: 2.99 V
Brady Statistic AP VP Percent: 0.05 %
Brady Statistic AP VS Percent: 64.56 %
Brady Statistic AS VP Percent: 0.01 %
Brady Statistic AS VS Percent: 35.38 %
Brady Statistic RA Percent Paced: 64.39 %
Brady Statistic RV Percent Paced: 0.06 %
Date Time Interrogation Session: 20250731181415
HighPow Impedance: 55 Ohm
Implantable Lead Connection Status: 753985
Implantable Lead Connection Status: 753985
Implantable Lead Implant Date: 20220220
Implantable Lead Implant Date: 20220220
Implantable Lead Location: 753859
Implantable Lead Location: 753860
Implantable Lead Model: 5076
Implantable Pulse Generator Implant Date: 20220220
Lead Channel Impedance Value: 266 Ohm
Lead Channel Impedance Value: 342 Ohm
Lead Channel Impedance Value: 418 Ohm
Lead Channel Pacing Threshold Amplitude: 0.5 V
Lead Channel Pacing Threshold Amplitude: 0.625 V
Lead Channel Pacing Threshold Pulse Width: 0.4 ms
Lead Channel Pacing Threshold Pulse Width: 0.4 ms
Lead Channel Sensing Intrinsic Amplitude: 0.8 mV
Lead Channel Sensing Intrinsic Amplitude: 11 mV
Lead Channel Setting Pacing Amplitude: 1.5 V
Lead Channel Setting Pacing Amplitude: 2 V
Lead Channel Setting Pacing Pulse Width: 0.4 ms
Lead Channel Setting Sensing Sensitivity: 0.3 mV
Zone Setting Status: 755011
Zone Setting Status: 755011
Zone Setting Status: 755011

## 2024-05-23 ENCOUNTER — Ambulatory Visit: Payer: Self-pay | Admitting: Cardiovascular Disease

## 2024-05-24 NOTE — Telephone Encounter (Signed)
-----   Message from Nurse Comer F sent at 05/23/2024  6:04 PM EDT ----- Regarding: Needs appt with device clinic or with EP APP please  ----- Message ----- From: Francyne Headland, MD Sent: 05/23/2024  11:55 AM EDT To: Comer LITTIE Ebbs, RN; Rosaline SQUIBB [address]  Remote reviewed.   Presenting rhythm is atrial sensed, ventricular sensed (normal sinus rhythm. Not pacemaker dependent. Battery status is good. Lead measurements (capture threshold, sensing and impedance) are stable. Heart rate histogram is favorable. No clinically significant episodes of high ventricular rate or atrial mode switch noted. Intermittent T wave oversensing is noted with inappropriate detection of VT on 05/18/2024.  Can we please see if we can bring him to the device clinic or to see EP APP and have the Medtronic rep adjust the settings to avoid T wave oversensing.  I do not want to wait the 4 weeks it would  take until I can see him in clinic.    ----- Message ----- From: Interface, [employer] Sent: 05/19/2024  10:59 AM EDT To: Head[place] Francyne, MD

## 2024-05-24 NOTE — Telephone Encounter (Signed)
 Attempted to contact patient. No answer, left message to call back.   Will need industry present to assure RV sensitivity is programmed to meet detection yet not over or under sense.

## 2024-05-30 NOTE — Progress Notes (Signed)
Attempted to contact patient. No answer, left message to call back

## 2024-06-01 NOTE — Telephone Encounter (Signed)
 Attempted to reach patient again.  LM on VM using an interpreter to please have him call us  to make an appointment with APP for programming changes to his device.

## 2024-06-08 NOTE — Telephone Encounter (Signed)
 Attempted to contact the patient using [employer] ID #: [id number]. No answer- left a message to please call back.    Attempted to contact the patient's sister Lela (ok per DPR) using [employer] ID #: [phone]. I was able to speak with Lela and advised we have attempted to contact the patient to schedule an in office appointment to have the his device reprogrammed to prevent inappropriate shocks, however we have been unable to reach him.  Per Lela, she will reach out to the patient and advise him that we will try to call him tomorrow.  She asked that we try to call him around 11 am. Lela offered to go ahead and obtain and appointment for the patient. Offered 06/23/24 at 11:20 am with Jodie Passey, PA. Per Lela, that appointment should fine. Advised we will still try to contact the patient tomorrow to advise him of what is going on with his device.  Lela voices understanding and is agreeable.

## 2024-06-23 ENCOUNTER — Encounter: Payer: Self-pay | Admitting: Student

## 2024-06-23 ENCOUNTER — Ambulatory Visit: Payer: Self-pay | Attending: Student | Admitting: Student

## 2024-06-23 VITALS — BP 110/60 | HR 70 | Ht 63.0 in | Wt 145.0 lb

## 2024-06-23 DIAGNOSIS — I5042 Chronic combined systolic (congestive) and diastolic (congestive) heart failure: Secondary | ICD-10-CM

## 2024-06-23 LAB — CUP PACEART INCLINIC DEVICE CHECK
Date Time Interrogation Session: 20250905115118
Implantable Lead Connection Status: 753985
Implantable Lead Connection Status: 753985
Implantable Lead Implant Date: 20220220
Implantable Lead Implant Date: 20220220
Implantable Lead Location: 753859
Implantable Lead Location: 753860
Implantable Lead Model: 5076
Implantable Pulse Generator Implant Date: 20220220

## 2024-06-23 NOTE — Progress Notes (Signed)
  Electrophysiology Office Note:   ID:  Pedro Kemp, Pedro Kemp Dec 12, 1955, MRN [id number]  Primary Cardiologist: Jerel Balding, MD Electrophysiologist: None - Dr. Balding currently     History of Present Illness:   Pedro Kemp is a 68 y.o. 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    Patient reports doing OK. He has had mildly dizziness over the past 3 days. Denies chest pain, palpitations, syncope, or edema. Taking medication as directed. BP OK. Taking spiro and losartan  in the morning.  Review of systems complete and found to be negative unless listed in HPI.   EP Information / Studies Reviewed:    EKG is not ordered today. EKG from 04/13/2024 reviewed which showed NSR at 76 bpm       ICD Interrogation-  reviewed in detail today,  See PACEART report.  Arrhythmia/Device History Medtronic Dual Chamber ICD implanted 05/20/2021 for CHF  Xarelto  ([employer]) 05/08/24-05/08/25  Farxiga  ([employer]) 05/08/24-05/08/25   Physical Exam:   VS:  There were no vitals taken for this visit.   Wt Readings from Last 3 Encounters:  05/05/24 149 lb 12.8 oz (67.9 kg)  04/13/24 149 lb 0.5 oz (67.6 kg)  01/08/24 156 lb 9.6 oz (71 kg)     GEN: No acute distress  NECK: No JVD; No carotid bruits CARDIAC: Regular rate and rhythm, no murmurs, rubs, gallops RESPIRATORY:  Clear to auscultation without rales, wheezing or rhonchi  ABDOMEN: Soft, non-tender, non-distended EXTREMITIES:  No edema; No deformity   ASSESSMENT AND PLAN:    Chronic systolic CHF  s/p Medtronic dual chamber ICD  euvolemic today Stable on an appropriate medical  regimen Normal ICD function See Pace Art report   T wave oversensing Reviewed with industry. R wave sensitivity adjusted to 0.45 mv If recurs, could adjust PVAB next.   Disposition:   Follow up with Dr. Balding as scheduled.    Signed, Ozell Prentice Passey, PA-C

## 2024-06-23 NOTE — Patient Instructions (Signed)
 Medication Instructions:  Your physician recommends that you continue on your current medications as directed. Please refer to the Current Medication list given to you today.  *If you need a refill on your cardiac medications before your next appointment, please call your pharmacy*  Lab Work: None ordered If you have labs (blood work) drawn today and your tests are completely normal, you will receive your results only by: MyChart Message (if you have MyChart) OR A paper copy in the mail If you have any lab test that is abnormal or we need to change your treatment, we will call you to review the results.  Follow-Up: At [facility], you and your health needs are our priority.  As part of our continuing mission to provide you with exceptional heart care, our providers are all part of one team.  This team includes your primary Cardiologist (physician) and Advanced Practice Providers or APPs (Physician Assistants and Nurse Practitioners) who all work together to provide you with the care you need, when you need it.  Your next appointment:   As scheduled  Provider:   Jerel Balding, MD

## 2024-06-29 ENCOUNTER — Other Ambulatory Visit: Payer: Self-pay

## 2024-06-29 ENCOUNTER — Emergency Department (HOSPITAL_COMMUNITY)
Admission: EM | Admit: 2024-06-29 | Discharge: 2024-06-29 | Disposition: A | Discharge status: Home / Self Care | Payer: Self-pay | Attending: Emergency Medicine | Admitting: Emergency Medicine

## 2024-06-29 ENCOUNTER — Emergency Department (HOSPITAL_COMMUNITY): Payer: Self-pay

## 2024-06-29 ENCOUNTER — Ambulatory Visit (INDEPENDENT_AMBULATORY_CARE_PROVIDER_SITE_OTHER): Payer: Self-pay | Admitting: Primary Care

## 2024-06-29 DIAGNOSIS — R7989 Other specified abnormal findings of blood chemistry: Secondary | ICD-10-CM

## 2024-06-29 DIAGNOSIS — E119 Type 2 diabetes mellitus without complications: Secondary | ICD-10-CM | POA: Insufficient documentation

## 2024-06-29 DIAGNOSIS — I509 Heart failure, unspecified: Secondary | ICD-10-CM | POA: Insufficient documentation

## 2024-06-29 DIAGNOSIS — D649 Anemia, unspecified: Secondary | ICD-10-CM | POA: Insufficient documentation

## 2024-06-29 DIAGNOSIS — I482 Chronic atrial fibrillation, unspecified: Secondary | ICD-10-CM | POA: Insufficient documentation

## 2024-06-29 DIAGNOSIS — R079 Chest pain, unspecified: Secondary | ICD-10-CM

## 2024-06-29 DIAGNOSIS — N289 Disorder of kidney and ureter, unspecified: Secondary | ICD-10-CM | POA: Insufficient documentation

## 2024-06-29 DIAGNOSIS — Z7901 Long term (current) use of anticoagulants: Secondary | ICD-10-CM | POA: Insufficient documentation

## 2024-06-29 DIAGNOSIS — I2510 Atherosclerotic heart disease of native coronary artery without angina pectoris: Secondary | ICD-10-CM | POA: Insufficient documentation

## 2024-06-29 DIAGNOSIS — I214 Non-ST elevation (NSTEMI) myocardial infarction: Secondary | ICD-10-CM | POA: Insufficient documentation

## 2024-06-29 DIAGNOSIS — Z7982 Long term (current) use of aspirin: Secondary | ICD-10-CM | POA: Insufficient documentation

## 2024-06-29 LAB — TROPONIN I (HIGH SENSITIVITY)
Troponin I (High Sensitivity): 1346 ng/L (ref <18)
Troponin I (High Sensitivity): 1482 ng/L (ref <18)

## 2024-06-29 LAB — CBC
HCT: 37.7 % — ABNORMAL LOW (ref 39.0–52.0)
Hemoglobin: 12.3 g/dL — ABNORMAL LOW (ref 13.0–17.0)
MCH: 30.6 pg (ref 26.0–34.0)
MCHC: 32.6 g/dL (ref 30.0–36.0)
MCV: 93.8 fL (ref 80.0–100.0)
Platelets: 186 K/uL (ref 150–400)
RBC: 4.02 MIL/uL — ABNORMAL LOW (ref 4.22–5.81)
RDW: 12.7 % (ref 11.5–15.5)
WBC: 9.2 K/uL (ref 4.0–10.5)
nRBC: 0 % (ref 0.0–0.2)

## 2024-06-29 LAB — BASIC METABOLIC PANEL WITH GFR
Anion gap: 12 (ref 5–15)
BUN: 39 mg/dL — ABNORMAL HIGH (ref 8–23)
CO2: 22 mmol/L (ref 22–32)
Calcium: 8.7 mg/dL — ABNORMAL LOW (ref 8.9–10.3)
Chloride: 104 mmol/L (ref 98–111)
Creatinine, Ser: 1.55 mg/dL — ABNORMAL HIGH (ref 0.61–1.24)
GFR, Estimated: 49 mL/min — ABNORMAL LOW (ref >60)
Glucose, Bld: 196 mg/dL — ABNORMAL HIGH (ref 70–99)
Potassium: 4.7 mmol/L (ref 3.5–5.1)
Sodium: 138 mmol/L (ref 135–145)

## 2024-06-29 LAB — APTT: aPTT: 34 s (ref 24–36)

## 2024-06-29 LAB — HEPARIN LEVEL (UNFRACTIONATED): Heparin Unfractionated: <0.1 [IU]/mL — ABNORMAL LOW (ref 0.30–0.70)

## 2024-06-29 LAB — BRAIN NATRIURETIC PEPTIDE: B Natriuretic Peptide: 608 pg/mL — ABNORMAL HIGH (ref 0.0–100.0)

## 2024-06-29 MED ORDER — HEPARIN (PORCINE) 25000 UT/250ML-% IV SOLN
800.0000 [IU]/h | INTRAVENOUS | Status: DC
  Administered 2024-06-29: 800 [IU]/h via INTRAVENOUS
  Filled 2024-06-29: qty 250

## 2024-06-29 MED ORDER — ASPIRIN 81 MG PO CHEW
324.0000 mg | CHEWABLE_TABLET | Freq: Once | ORAL | Status: AC
  Administered 2024-06-29: 324 mg via ORAL
  Filled 2024-06-29: qty 4

## 2024-06-29 NOTE — Progress Notes (Addendum)
 Progress Note  Patient Name: Pedro Kemp Date of Encounter: 06/29/2024 [facility] Cardiologist: Jerel Balding, MD   Interval Summary   No chest pain or shortness of breath.  Feels like his breathing has improved.  Resting comfortably.  Vital Signs Vitals:   06/29/24 0700 06/29/24 1000 06/29/24 1100 06/29/24 1300  BP: 113/67 110/71 106/67 111/67  Pulse: 63 (!) 59 62 (!) 59  Resp: 18 13 11 12   Temp:      TempSrc:      SpO2: 99% 100% 100% 100%  Weight:      Height:       No intake or output data in the 24 hours ending 06/29/24 1428    06/29/2024    2:02 AM 06/23/2024   11:28 AM 05/05/2024    2:29 PM  Last 3 Weights  Weight (lbs) 145 lb 1 oz 145 lb 149 lb 12.8 oz  Weight (kg) 65.8 kg 65.772 kg 67.949 kg      Telemetry/ECG  Sinus rhythm heart rates in the 70s- Personally Reviewed  Physical Exam  GEN: No acute distress.   Neck: No JVD Cardiac: RRR, no murmurs, rubs, or gallops.  Respiratory: Some crackles  GI: Soft, nontender, non-distended  MS: No edema  Patient Profile Patient with past medical history significant for  ischemic cardiomyopathy with reduced EF, CAD status post DES to LAD/Diag, VT arrest status post medtronic ICD 2022, MR, hypertension, type 2 diabetes, Spanish-speaking needs interpreter   Assessment & Plan   Elevated troponins CAD status post DES to LAD/diagonal Last cardiac catheterization in November 2024 demonstrating diffuse high-grade disease although no targets for intervention.  He has been completely asymptomatic and very good functional capacity and walks everywhere and goes to work.  EKG overall unchanged.  Troponins [id number].  No plans for any intervention as long as he remains asymptomatic. Continue with atorvastatin  80 mg, Toprol -XL 25 mg Per Dr. Balding does not want to be on aspirin  along with his DOAC.  He needs LDL checked outpatient.  Last 1 was 2 years ago.  Acute on chronic HFrEF 30-35% Ischemic  cardiomyopathy -08/2023 EF 30 to 35%, normal RV.  Moderate MR -08/2023 [facility] mildly elevated filling pressures, mildly low cardiac output.  CO 3.6, CI 2.0. Wedge 17, mean PA 26, PVR 2.3. - 03/2024 EF 25 to 30%, G2 DD.  Normal RV.  Severely dilated LA.  Mild MR.   EF has persistently been depressed despite GDMT although compliance has been questionable.  Reports he is taking medications.  Has had improvement in shortness of breath despite no diuretics.  Has mild crackles on exam.   GDMT has been limited by financial restraints: Continue Jardiance  10 mg, Toprol -XL 25 mg, losartan  25 mg.  Had hyperkalemia with spironolactone .  Continue Lasix  40 mg twice daily.  Monitor weights very closely. Instructed to take extra dose of lasix  for weight gain 2-3lbs.  Will schedule close follow-up   VT arrest status post Medtronic ICD Normal device check 06/2024.  Remote history of monomorphic VT during hospitalization July 2022 with inducible arrhythmia on EP study.   Paroxysmal atrial fibrillation Maintaining NSR.  Very low burden.  Continue with Xarelto  20 mg daily and Toprol -XL  CKD Creatinine around baseline.  1.5 today.  Continue SGLT2 inhibitor  Discussed with MD.  Plan is for discharge from the ED.  Elevated troponin likely secondary to demand ischemia in the setting of high-grade stenosis  with no targets for PCI  Very mildly volume up will schedule  follow-up.  For questions or updates, please contact [facility] Please consult [email or web address] for contact info under       Signed, Thom LITTIE Sluder, PA-C     Patient seen an examined   I agree with findings as noted by GORMAN Sluder above Pt is breathing OK  Better than on arrival to ER  On exam JVP approx 9 cm Lungs   Rales at bases  Cardiac exam   RRR  No S3   no murmurs  Abd  Supple  No hepatomegaly  Ext without edema  1  Elevated troponin   Pt has had in the past    He denies any CP   With severe CAD that he has and CHF exacerbation I do  not think rise is unexpected    Continue medical Rx   HFrEF  Acute exacerbation.   Has improved ince arrival with Lasix     Would continue PO   Recomm he weigh daily   IF weight goes up over 3 to 4 lbs then take 2 lasix  am and 1 PM    Would not give additional here given Cr    He is comfortable  OK to take orally   Hx VT  Pt s/p ICD  PAF   Currently in SR   Keep on Xarelto   OK to d/c home today    Will arrange follow up in clinic   Vina Gull MD

## 2024-06-29 NOTE — ED Triage Notes (Addendum)
 Pt BIB EMS for SOB starting an hour ago. Pt was walking when it started. Denies chest pain. Pt states that this happens often. Per ems pt is supposed to wear 2lnc at baseline. Pt states that he has never been told he needs to wear O2. Denies cough, fever chills. Pt on 2lnc with ems 100%. Pt 97% on RA

## 2024-06-29 NOTE — Progress Notes (Signed)
 PHARMACY - ANTICOAGULATION CONSULT NOTE  Pharmacy Consult for heparin  Indication: chest pain/ACS  No Known Allergies  Patient Measurements: Height: 5' 3 (160 cm) Weight: 65.8 kg (145 lb 1 oz) IBW/kg (Calculated) : 56.9 HEPARIN  DW (KG): 65.8  Vital Signs: BP: 112/72 (09/11 0207) Pulse Rate: 76 (09/11 0207)  Labs: Recent Labs    06/29/24 0255  HGB 12.3*  HCT 37.7*  PLT 186  TROPONINIHS 1,346*    CrCl cannot be calculated (Patient's most recent lab result is older than the maximum 21 days allowed.).   Medical History: Past Medical History:  Diagnosis Date   Cardiac arrest [facility])    CHF (congestive heart failure) (HCC)    Coronary artery disease    Diabetes mellitus without complication (HCC)    H/O heart artery stent    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    Assessment: 68yo male c/o SOB that causes a pressure feeling (via interpreter), durig w/u troponin found to be elevated >> to begin heparin ; of note pt is on rivaroxaban  for PAF, last dose 9/10 am.  Goal of Therapy:  Heparin  level 0.3-0.7 units/ml aPTT 66-102 seconds Monitor platelets by anticoagulation protocol: Yes   Plan:  At 8a start heparin  infusion at 800 units/hr. Monitor heparin  levels, aPTT (while DOAC affects anti-Xa assay), and CBC.  Marvetta Dauphin, PharmD, BCPS  06/29/2024,4:31 AM

## 2024-06-29 NOTE — ED Provider Notes (Signed)
 [place] EMERGENCY DEPARTMENT AT [facility] Provider Note   CSN: [id number] Arrival date & time: 06/29/24  0159     Patient presents with: Shortness of Breath   Pedro Kemp is a 68 y.o. male.   The history is provided by the patient. A language interpreter was used Lad[place] [phone]).  Shortness of Breath  He has history of diabetes, hyperlipidemia, coronary artery disease, heart failure, paroxysmal atrial fibrillation anticoagulated on rivaroxaban  and comes in because of difficulty breathing.  He was awakened at 2 AM by difficulty breathing and discomfort in the epigastric area.  He denies pain but states it just feels like when he tries to breathe there is a pressure feeling.  It is difficult to get nuances using the interpreter.  He denies nausea, vomiting, diaphoresis.  He has had similar episodes in the past and is not sure with the diagnosis was.  He states that he took 2 doses of furosemide  without any improvement.  He is a non-smoker.    Prior to Admission medications   Medication Sig Start Date End Date Taking? Authorizing Provider  aspirin  EC 81 MG tablet Take 1 tablet (81 mg total) by mouth daily. Swallow whole. 05/05/24   Croitoru, Mihai, MD  atorvastatin  (LIPITOR ) 80 MG tablet Take 1 tablet (80 mg total) by mouth daily. 05/05/24   Croitoru, Mihai, MD  dapagliflozin  propanediol (FARXIGA ) 10 MG TABS tablet Take 1 tablet (10 mg total) by mouth daily before breakfast. 05/05/24   Croitoru, Mihai, MD  furosemide  (LASIX ) 40 MG tablet Take 1 tablet (40 mg total) by mouth 2 (two) times daily. 05/05/24   Croitoru, Mihai, MD  losartan  (COZAAR ) 25 MG tablet Take 1 tablet (25 mg total) by mouth daily. 05/05/24   Croitoru, Mihai, MD  metoprolol  succinate (TOPROL -XL) 25 MG 24 hr tablet Take 1 tablet (25 mg total) by mouth daily. 05/05/24   Croitoru, Mihai, MD  polyethylene glycol powder (GLYCOLAX /MIRALAX ) 17 GM/SCOOP powder Take 17 g by mouth daily. 04/15/24   Regalado, Belkys A,  MD  rivaroxaban  (XARELTO ) 20 MG TABS tablet Take 1 tablet (20 mg total) by mouth daily wtih food. 05/05/24   Croitoru, Jerel, MD    Allergies: Patient has no known allergies.    Review of Systems  Respiratory:  Positive for shortness of breath.   All other systems reviewed and are negative.   Updated Vital Signs BP 112/72 (BP Location: Right Arm)   Pulse 76   Resp 20   Ht 5' 3 (1.6 m)   Wt 65.8 kg   SpO2 99%   BMI 25.70 kg/m   Physical Exam Vitals and nursing note reviewed.   68 year old male, resting comfortably and in no acute distress. Vital signs are normal. Oxygen saturation is 99%, which is normal. Head is normocephalic and atraumatic. PERRLA, EOMI.  Neck is nontender and supple without adenopathy or JVD. Back is nontender and there is no CVA tenderness. Lungs have few bibasilar rales without wheezes or rhonchi. Chest is nontender. Heart has regular rate and rhythm without murmur. Abdomen is soft, flat, nontender. Extremities have no cyanosis or edema, full range of motion is present. Skin is warm and dry without rash. Neurologic: Mental status is normal, cranial nerves are intact, moves all extremities equally.  (all labs ordered are listed, but only abnormal results are displayed) Labs Reviewed  CBC - Abnormal; Notable for the following components:      Result Value   RBC 4.02 (*)  Hemoglobin 12.3 (*)    HCT 37.7 (*)    All other components within normal limits  BRAIN NATRIURETIC PEPTIDE - Abnormal; Notable for the following components:   B Natriuretic Peptide 608.0 (*)    All other components within normal limits  TROPONIN I (HIGH SENSITIVITY) - Abnormal; Notable for the following components:   Troponin I (High Sensitivity) 1,346 (*)    All other components within normal limits  BASIC METABOLIC PANEL WITH GFR  TROPONIN I (HIGH SENSITIVITY)    EKG: EKG Interpretation Date/Time:  Thursday June 29 2024 02:12:14 EDT Ventricular Rate:  74 PR  Interval:  164 QRS Duration:  128 QT Interval:  396 QTC Calculation: 439 R Axis:   61  Text Interpretation: Normal sinus rhythm with sinus arrhythmia Left ventricular hypertrophy with QRS widening and repolarization abnormality ( Cornell product ) Abnormal ECG When compared with ECG of 13-Apr-2024 03:48, T wave inversion is now present Inferior leads Confirmed by Raford Lenis ([id number]) on 06/29/2024 2:42:52 AM  ED ECG REPORT   Date: 06/29/2024  Rate: 67  Rhythm: normal sinus rhythm  QRS Axis: normal  Intervals: normal  ST/T Wave abnormalities: nonspecific T wave changes  Conduction Disutrbances:nonspecific intraventricular conduction delay  Narrative Interpretation: Nonspecific intraventricular conduction delay with T wave inversions in the inferior leads unchanged from ECG from earlier tonight.  Old EKG Reviewed: unchanged  I have personally reviewed the EKG tracing and agree with the computerized printout as noted.  Radiology: DG Chest 2 View Result Date: 06/29/2024 CLINICAL DATA:  Initial evaluation for acute shortness of breath. EXAM: CHEST - 2 VIEW COMPARISON:  Prior radiograph from 04/13/2024. FINDINGS: Dual lead left-sided pacemaker/AICD in place with electrodes overlying the right atrium right ventricle. Transverse heart size is stable. Mediastinal silhouette within normal limits. Aortic atherosclerosis. Lungs mildly hypoinflated. Prominent perihilar vascular congestion with mild interstitial prominence, but no overt pulmonary edema. No significant pleural effusion. No consolidative airspace disease. No pneumothorax. Visualized soft tissues and osseous structures demonstrate no acute finding. IMPRESSION: 1. Perihilar vascular congestion with mild diffuse interstitial prominence, suggesting mild pulmonary interstitial congestion/edema. 2.  Aortic Atherosclerosis (ICD10-I70.0). Electronically Signed   By: Morene Hoard M.D.   On: 06/29/2024 02:39     Procedures   Medications  Ordered in the ED - No data to display                                  Medical Decision Making Amount and/or Complexity of Data Reviewed Labs: ordered. Radiology: ordered.  Risk OTC drugs. Prescription drug management. Decision regarding hospitalization.   Shortness of breath with possible chest discomfort.  This is a presentation with a wide range of treatment options and carries with it a high risk of morbidity and complications.  Differential diagnosis includes, but is not limited to, heart failure exacerbation, ACS, pulmonary embolism, pneumonia, pneumothorax.  I have reviewed his electrocardiogram, and my impression is new T wave inversions in the inferior leads concerning for non-STEMI.  Chest x-ray shows perihilar vascular congestion suggesting mild pulmonary edema.  I have independently viewed the images, and agree with the radiologist's interpretation.  I have reviewed his laboratory tests, and my interpretation is stable anemia, stable renal insufficiency, elevated BNP consistent with heart failure, moderately elevated troponin concerning for ACS.  I reviewed his past records, and note hospitalization on 04/13/2024 for non-STEMI.  Cardiac catheterization on 08/24/2023 showed severe three-vessel disease without an obvious culprit  lesion, patent stents in the mid LAD and diagonal branch.  Echocardiogram on 04/13/2024 showed left ventricular ejection fraction 25-30% with grade 2 diastolic dysfunction.  I have ordered aspirin  and an IV heparin  infusion.  I have reviewed a repeat electrocardiogram, and my interpretation is unchanged from initial ECG.  I have discussed the case with Dr. Orlando of cardiology service who agrees to see the patient in consultation.    CRITICAL CARE Performed by: Alm Lias Total critical care time: 50 minutes Critical care time was exclusive of separately billable procedures and treating other patients. Critical care was necessary to treat or prevent imminent  or life-threatening deterioration. Critical care was time spent personally by me on the following activities: development of treatment plan with patient and/or surrogate as well as nursing, discussions with consultants, evaluation of patient's response to treatment, examination of patient, obtaining history from patient or surrogate, ordering and performing treatments and interventions, ordering and review of laboratory studies, ordering and review of radiographic studies, pulse oximetry and re-evaluation of patient's condition.     Final diagnoses:  Non-STEMI (non-ST elevated myocardial infarction) (HCC)  Renal insufficiency  Normochromic normocytic anemia    ED Discharge Orders     None          Lias Alm, MD 06/29/24 [phone]

## 2024-06-29 NOTE — ED Provider Triage Note (Signed)
 Emergency Medicine Provider Triage Evaluation Note  Pedro Kemp , a 68 y.o. male  was evaluated in triage.  Pt complains of shortness of breath that began 1 hour prior to arrival.  Patient was walking when the shortness of breath started.  He denied associated chest pain and endorses frequent episodes of the same.  EMS stated that the patient was supposed to be wearing oxygen at baseline, patient denies the same.  On room air patient is currently satting 97%..  Review of Systems  Positive:  Negative:   Physical Exam  BP 112/72 (BP Location: Right Arm)   Pulse 76   Resp 20   Ht 5' 3 (1.6 m)   Wt 65.8 kg   SpO2 99%   BMI 25.70 kg/m  Gen:   Awake, no distress   Resp:  Normal effort  MSK:   Moves extremities without difficulty  Other:    Medical Decision Making  Medically screening exam initiated at 2:43 AM.  Appropriate orders placed.  Pedro Kemp was informed that the remainder of the evaluation will be completed by another provider, this initial triage assessment does not replace that evaluation, and the importance of remaining in the ED until their evaluation is complete.     Logan Ubaldo NOVAK, PA-C 06/29/24 [phone]

## 2024-06-29 NOTE — ED Provider Notes (Signed)
 Patient is pending admission for NSTEMI Seen by overnight cardiology fellow who needed to confer with day team regarding LHC and admission service  Pt's troponins elevated but stable on repeat Aspirin  and heparin  ordered per Dr Raford Vitals stable - on 2L [place]  7:30 am -  I notified cardiology fellow to update me about plan for admission  10 am - cardsmaster Trish informed me that morning team and cardiologist were aware of patient and still planning to round - no further recommendations offered.  1:30 pm - cardiology team re-notified of need for consultation. PA provider Darryle to see patient.  2:30 pm -the patient's case was seen and staffed by the cardiology services.  They note that he was asymptomatic and felt that his troponin levels are chronically elevated and consistent with his underlying medical co-morbidities.  They felt that no further inpatient intervention was needed, and that he could be discontinued off of the heparin  and discharged home with outpatient follow-up.  They will arrange for clinic follow-up.  Patient to be discharged   Cottie Donnice PARAS, MD 06/29/24 1453

## 2024-06-29 NOTE — Progress Notes (Signed)
 Responded to page ,  Provided support. Chaplain available as needed.  Rayleen Dade, [place], [facility], Pager [phone]

## 2024-06-29 NOTE — Consult Note (Signed)
 Cardiology Consultation   Patient ID: Pedro Kemp MRN: [id number]; DOB: 1955/10/28  Admit date: 06/29/2024 Date of Consult: 06/29/2024  PCP:  Celestia Rosaline SQUIBB, NP   [facility] Providers Cardiologist:  Jerel Balding, MD      Patient Profile: Pedro Kemp is a 68 y.o. male with a hx of  ischemic cardiomyopathy with reduced EF, s/p MedTronic ICD post VT arrest in 2022, paroxsymal atrial fibrillation on Xarelto , CAD s/p DES to LAD, D1 (04/2021 at [facility]), hypertension, type 2 diabetes, MR who is being seen 06/29/2024 for the evaluation of chest pain  at the request of Dr. Raford.  History of Present Illness: Pedro Kemp was sleeping this morning around 0200 when he developed an acute onset of shortness of breath and chest pain. This woke him up from sleep and only improved once he received oxygen from EMS. Upon arrival to the ED, he states he is chest pain free and does not have any symptoms.  The patient had a similar presentation in June of 2025. At that time, he had run out of his medications as he was trying to make them last longer. He had an elevated troponin at that time, thought to be 2/2 HF. The patient had a coronary angiogram back in November of 2024 and there were no PCI targets at that time (high grade Lcx lesion and high grade diag lesion). He was continued on medical therapy with aspirin , atorvastatin  80, losartan  25 and metoprolol  25. He has a history of afib and takes Xarelto .  Today, his initial troponin is 1340-> 1480. He was aspirin  loaded and started on a heparin  gtt. ECG did not show STEMI.   Past Medical History:  Diagnosis Date   Cardiac arrest [facility])    CHF (congestive heart failure) (HCC)    Coronary artery disease    Diabetes mellitus without complication (HCC)    H/O heart artery stent    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    Past Surgical History:  Procedure Laterality Date   CARDIAC  CATHETERIZATION     ICD IMPLANT     RIGHT/LEFT HEART CATH AND CORONARY ANGIOGRAPHY N/A 08/24/2023   Procedure: RIGHT/LEFT HEART CATH AND CORONARY ANGIOGRAPHY;  Surgeon: Wendel Lurena POUR, MD;  Location: [facility] INVASIVE CV LAB;  Service: Cardiovascular;  Laterality: N/A;     Home Medications:  Prior to Admission medications   Medication Sig Start Date End Date Taking? Authorizing Provider  aspirin  EC 81 MG tablet Take 1 tablet (81 mg total) by mouth daily. Swallow whole. 05/05/24  Yes Croitoru, Mihai, MD  atorvastatin  (LIPITOR ) 80 MG tablet Take 1 tablet (80 mg total) by mouth daily. 05/05/24  Yes Croitoru, Mihai, MD  dapagliflozin  propanediol (FARXIGA ) 10 MG TABS tablet Take 1 tablet (10 mg total) by mouth daily before breakfast. 05/05/24  Yes Croitoru, Mihai, MD  furosemide  (LASIX ) 40 MG tablet Take 1 tablet (40 mg total) by mouth 2 (two) times daily. 05/05/24  Yes Croitoru, Mihai, MD  losartan  (COZAAR ) 25 MG tablet Take 1 tablet (25 mg total) by mouth daily. 05/05/24  Yes Croitoru, Mihai, MD  metoprolol  succinate (TOPROL -XL) 25 MG 24 hr tablet Take 1 tablet (25 mg total) by mouth daily. 05/05/24  Yes Croitoru, Mihai, MD  polyethylene glycol powder (GLYCOLAX /MIRALAX ) 17 GM/SCOOP powder Take 17 g by mouth daily. Patient taking differently: Take 17 g by mouth daily as needed for mild constipation. 04/15/24  Yes Regalado, Owen LABOR, MD  rivaroxaban  (  XARELTO ) 20 MG TABS tablet Take 1 tablet (20 mg total) by mouth daily wtih food. 05/05/24  Yes Croitoru, Mihai, MD    Scheduled Meds:  Continuous Infusions:  heparin      PRN Meds:   Allergies:   No Known Allergies  Social History:   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  Tobacco Use   Smoking status: Never    Passive exposure: Never   Smokeless  tobacco: Never  Vaping Use   Vaping status: Never Used  Substance and Sexual Activity   Alcohol use: Never   Drug use: Never   Sexual activity: Not Currently    Partners: Female  Other Topics Concern   Not on file  Social History Narrative   Not on file   Social Drivers of Health   Financial Resource Strain: High Risk (08/25/2023)   Overall Financial Resource Strain (CARDIA)    Difficulty of Paying Living Expenses: Hard  Food Insecurity: Food Insecurity Present (04/13/2024)   Hunger Vital Sign    Worried About Running Out of Food in the Last Year: Sometimes true    Ran Out of Food in the Last Year: Sometimes true  Transportation Needs: Unmet Transportation Needs (04/13/2024)   PRAPARE - Administrator, Civil Service (Medical): Yes    Lack of Transportation (Non-Medical): Yes  Physical Activity: Not on file  Stress: Not on file  Social Connections: Socially Isolated (04/13/2024)   Social Connection and Isolation Panel    Frequency of Communication with Friends and Family: Once a week    Frequency of Social Gatherings with Friends and Family: Never    Attends Religious Services: Never    Database administrator or Organizations: No    Attends Banker Meetings: Never    Marital Status: Widowed  Intimate Partner Violence: Not At Risk (04/13/2024)   Humiliation, Afraid, Rape, and Kick questionnaire    Fear of Current or Ex-Partner: No    Emotionally Abused: No    Physically Abused: No    Sexually Abused: No    Family History:   Family History  Problem Relation Age of Onset   Diabetes Mother    Diabetes Brother      ROS:  Please see the history of present illness.  All other ROS reviewed and negative.     Physical Exam/Data: Vitals:   06/29/24 0202 06/29/24 0207  BP:  112/72  Pulse:  76  Resp:  20  SpO2:  99%  Weight: 65.8 kg   Height: 5' 3 (1.6 m)    No intake or output data in the 24 hours ending 06/29/24 0457    06/29/2024    2:02 AM  06/23/2024   11:28 AM 05/05/2024    2:29 PM  Last 3 Weights  Weight (lbs) 145 lb 1 oz 145 lb 149 lb 12.8 oz  Weight (kg) 65.8 kg 65.772 kg 67.949 kg     Body mass index is 25.7 kg/m.  General:  Well nourished, well developed, in no acute distress HEENT: normal Neck: no JVD Vascular: No carotid bruits; Distal pulses 2+ bilaterally Cardiac:  normal S1, S2; RRR; no murmur  Lungs:  clear to auscultation bilaterally, no wheezing, rhonchi or rales  Abd: soft, nontender, no  hepatomegaly  Ext: no edema Musculoskeletal:  No deformities, BUE and BLE strength normal and equal Skin: warm and dry  Neuro:  no focal abnormalities noted Psych:  Normal affect   EKG:  The EKG was personally reviewed and demonstrates:  SR with LBBB morphology, nonspecific STT wave changes, no STEMI Telemetry:  Telemetry was personally reviewed and demonstrates:  SR  Relevant CV Studies: Coronary Angiogram  08/24/2023   Prox RCA lesion is 60% stenosed.   Mid RCA lesion is 50% stenosed.   Dist RCA lesion is 80% stenosed.   Mid LM to Dist LM lesion is 20% stenosed.   Prox Cx to Mid Cx lesion is 80% stenosed.   2nd Mrg lesion is 99% stenosed.   1st Diag-2 lesion is 5% stenosed.   1st Diag-1 lesion is 80% stenosed.   RPDA lesion is 50% stenosed.   Previously placed Mid LAD stent of unknown type is  widely patent.   1.  Patent mid LAD and diagonal stents. 2.  High-grade ostial diagonal disease. 3.  High-grade mid left circumflex disease; this is a very small vessel and feeds a subtotally occluded obtuse marginal. 4.  Moderate diffuse disease of the right coronary artery with an 80% lesion of the distal RCA. 5.  Fick cardiac output of 3.6 L/min and Fick cardiac index of 2.0 L/min/m with the following hemodynamics:             RA pressure mean 8 mmHg             RV pressure 37/-2 with an end-diastolic pressure of 11 mmHg             Wedge pressure mean of 17 mmHg             PA pressure 36/15 with a mean of 26  mmHg             PVR of 2.3 Woods units             PA pulsatility index of 2.6 6.  LVEDP of 27 to 30 mmHg   Recommendation: The images were reviewed with Dr. Elmira.  There does not seem to be a clear culprit lesion.  At this point in time we will treat medically.  TTE 04/13/2024 IMPRESSIONS   1. Left ventricular ejection fraction, by estimation, is 25 to 30%. Left  ventricular ejection fraction by 2D MOD biplane is 25.1 %. The left  ventricle has severely decreased function. The left ventricle demonstrates  regional wall motion abnormalities  (see scoring diagram/findings for description). The left ventricular  internal cavity size was mildly dilated. Left ventricular diastolic  parameters are consistent with Grade II diastolic dysfunction  (pseudonormalization). Elevated left atrial pressure.   2. Right ventricular systolic function is low normal. The right  ventricular size is normal. There is normal pulmonary artery systolic  pressure.   3. Left atrial size was severely dilated.   4. The mitral valve is normal in structure. Mild mitral valve  regurgitation. No evidence of mitral stenosis.   5. The aortic valve is tricuspid. Aortic valve regurgitation is not  visualized. No aortic stenosis is present.   6. The inferior vena cava is normal in size with greater than 50%  respiratory variability, suggesting right atrial pressure of 3 mmHg.   Laboratory Data: High Sensitivity Troponin:   Recent Labs  Lab 06/29/24 0255  TROPONINIHS 1,346*     Chemistry Recent Labs  Lab 06/29/24 0420  NA 138  K  4.7  CL 104  CO2 22  GLUCOSE 196*  BUN 39*  CREATININE 1.55*  CALCIUM  8.7*  GFRNONAA 49*  ANIONGAP 12    Hematology Recent Labs  Lab 06/29/24 0255  WBC 9.2  RBC 4.02*  HGB 12.3*  HCT 37.7*  MCV 93.8  MCH 30.6  MCHC 32.6  RDW 12.7  PLT 186    BNP Recent Labs  Lab 06/29/24 0255  BNP 608.0*      Radiology/Studies:  DG Chest 2 View Result Date:  06/29/2024 CLINICAL DATA:  Initial evaluation for acute shortness of breath. EXAM: CHEST - 2 VIEW COMPARISON:  Prior radiograph from 04/13/2024. FINDINGS: Dual lead left-sided pacemaker/AICD in place with electrodes overlying the right atrium right ventricle. Transverse heart size is stable. Mediastinal silhouette within normal limits. Aortic atherosclerosis. Lungs mildly hypoinflated. Prominent perihilar vascular congestion with mild interstitial prominence, but no overt pulmonary edema. No significant pleural effusion. No consolidative airspace disease. No pneumothorax. Visualized soft tissues and osseous structures demonstrate no acute finding. IMPRESSION: 1. Perihilar vascular congestion with mild diffuse interstitial prominence, suggesting mild pulmonary interstitial congestion/edema. 2.  Aortic Atherosclerosis (ICD10-I70.0). Electronically Signed   By: Morene Hoard M.D.   On: 06/29/2024 02:39    Assessment and Plan: Elevated troponin Chest pain  Shortness of breath  CAD s/p DES LAD, D1 Hld Htn The patient presents with a very similar story to prior hospitalization back in June of 2025. Troponin was 3k at that time, and he was not taken to the cath lab. It has been nearly a year since his last coronary angiogram, and he is likely having a combination of HF exacerbations and angina. While he didn't have any targets for PCI at that time, there may be utility to bring him back to the lab to define his coronary anatomy. We will discuss with the day team the benefit of LHC. Also may benefit from RHC to evaluate filling pressures and cardiac index. - NPO for now - Heparin  gtt - Aspirin  daily - Atorvastatin  80   Acute on chronic systolic and diastolic heart failure with reduced EF HFrEF ICM  VT arrest s/p MDT ICD HF likely contributing to current presentation. Now asymptomatic. JVD not overtly elevated. - Can restart home Po Lasix  regimen  Paroxysmal afib  - On heparin  gtt - Okay to  hold Xarelto  for now - Metoprolol   CKD  Risk Assessment/Risk Scores:       New York  Heart Association (NYHA) Functional Class NYHA Class II  CHA2DS2-VASc Score = 5   This indicates a 7.2% annual risk of stroke. The patient's score is based upon: CHF History: 1 HTN History: 1 Diabetes History: 1 Stroke History: 0 Vascular Disease History: 1 Age Score: 1 Gender Score: 0      For questions or updates, please contact [facility] Please consult [email or web address] for contact info under    Signed, Jerrell DELENA Orchard, MD  06/29/2024 4:57 AM

## 2024-06-29 NOTE — Discharge Instructions (Addendum)
 You will need to follow-up with a cardiologist in the office next week.  If your pain gets worse, or you have difficulty breathing, or lightheadedness, please return to the emergency department

## 2024-07-02 ENCOUNTER — Ambulatory Visit: Payer: Self-pay | Admitting: Cardiovascular Disease

## 2024-07-05 ENCOUNTER — Encounter: Payer: Self-pay | Admitting: Physician Assistant

## 2024-07-05 ENCOUNTER — Ambulatory Visit: Payer: Self-pay | Attending: Physician Assistant | Admitting: Cardiology

## 2024-07-05 VITALS — BP 118/60 | HR 70 | Ht 63.0 in | Wt 143.6 lb

## 2024-07-05 DIAGNOSIS — I480 Paroxysmal atrial fibrillation: Secondary | ICD-10-CM

## 2024-07-05 DIAGNOSIS — I10 Essential (primary) hypertension: Secondary | ICD-10-CM

## 2024-07-05 DIAGNOSIS — N1831 Chronic kidney disease, stage 3a: Secondary | ICD-10-CM

## 2024-07-05 DIAGNOSIS — I5022 Chronic systolic (congestive) heart failure: Secondary | ICD-10-CM

## 2024-07-05 DIAGNOSIS — I25118 Atherosclerotic heart disease of native coronary artery with other forms of angina pectoris: Secondary | ICD-10-CM

## 2024-07-05 DIAGNOSIS — Z95810 Presence of automatic (implantable) cardiac defibrillator: Secondary | ICD-10-CM

## 2024-07-05 DIAGNOSIS — I472 Ventricular tachycardia, unspecified: Secondary | ICD-10-CM

## 2024-07-05 NOTE — Progress Notes (Signed)
 Cardiology Office Note:  .   Date:  07/05/2024  ID:  Pedro Kemp, DOB September 14, 1956, MRN [id number] PCP: Pedro Rosaline SQUIBB, NP  [facility] Providers Cardiologist:  Jerel Balding, MD {  History of Present Illness: .   Pedro Kemp is a 68 y.o. male with history of ischemic cardiomyopathy with reduced EF, CAD status post DES to LAD/Diag, NSTEMI with VT arrest status post medtronic ICD 05/2021, PAF with low prevalence MR, hypertension, type 2 diabetes, Spanish-speaking needs interpreter.     CAD 04/2021 NSTEMI, newly reduced EF.  LHC 100% occluded mid LAD with DES to LAD and diagonal.  2 days later had VT arrest.  In the Cath Lab had patent stents.  Underwent EP study and had inducible sustained monomorphic VT.  Discharged on ICD and amiodarone 08/2023 hospitalized for CHF/elevated troponins.  LHC with patent mid LAD/diagonal stents.  Had high-grade ostial diagonal disease/high-grade mid left circumflex disease/diffuse RCA disease but no targets for PCI.  Medical management recommended.  Had low CO. ED visit 06/2024 for elevated troponins 1400+.  Asymptomatic, no plans for intervention with no options for PCI previously.  Likely demand from CHF.  ICM Multiple hospitalizations in the past in the setting of running out of his medications and not getting them refilled. 11/2021 EF 30 to 35% 08/2023 EF 30 to 35%.  Fick 3.6, CI 2.0.  RVSP 37.  Wedge 17.  PA pressure 36/15 mean 26.  PVR 2.3.  LVEDP 27 CHF admission 03/2024 EF 25 to 30%.  Ran out of this medications.  PAF Low burden less than 1%.  Social history  Works at a [employer].  Walks to work which is about 45 minutes away. Lives with his nephews Does not smoke, no drugs or alcohol     Patient with known history of CAD with previous stent placement to LAD and diagonal with inducible monomorphic VT now with ICD.  He has known high-grade ostial diagonal disease/high-grade mid LCx disease and diffuse RCA disease that is not  amenable to PCI but has been asymptomatic.  Also with ICM with persistently reduced EF complicated by running out of meds at home.  He was seen in the ED 9/5 for elevated troponins but was asymptomatic.  Troponins almost 1500.  Felt to be more related to mild CHF exacerbation.  He was discharged home same day after given dose of IV Lasix  with close outpatient follow-up.  Today patient is here for follow-up post ED visit.  Interpretation services were used.  Doing very well overall.  He reports no acute symptoms and has not had any issues with shortness of breath, peripheral edema, orthopnea.  Reports 100% compliancy with all of his medications.  Still physically active and walking to work without any complaints.  Continues to exhibit no exertional symptoms.  Denies any chest pain or decrease in functional capacity.  ROS: Denies: Chest pain, shortness of breath, orthopnea, peripheral edema, palpitations, decreased exercise intolerance, fatigue, lightheadedness.   Studies Reviewed: .         Risk Assessment/Calculations:    CHA2DS2-VASc Score = 5   This indicates a 7.2% annual risk of stroke. The patient's score is based upon: CHF History: 1 HTN History: 1 Diabetes History: 1 Stroke History: 0 Vascular Disease History: 1 Age Score: 1 Gender Score: 0      Physical Exam:   VS:  BP 118/60 (BP Location: Left Arm, Patient Position: Sitting, Cuff Size: Normal)   Pulse 70   Ht 5' 3 (1.6  m)   Wt 143 lb 9.6 oz (65.1 kg)   SpO2 98%   BMI 25.44 kg/m    Wt Readings from Last 3 Encounters:  07/05/24 143 lb 9.6 oz (65.1 kg)  06/29/24 145 lb 1 oz (65.8 kg)  06/23/24 145 lb (65.8 kg)    GEN: Well nourished, well developed in no acute distress NECK: No JVD; No carotid bruits CARDIAC: RRR, no murmurs, rubs, gallops RESPIRATORY:  Clear to auscultation without rales, wheezing or rhonchi  ABDOMEN: Soft, non-tender, non-distended EXTREMITIES:  No edema; No deformity   ASSESSMENT AND PLAN: .     ICM with reduced EF - Noted 11/2021 EF 30 to 35% through 08/2023 -08/2023 RHC mildly elevated filling pressures, mildly low cardiac output.  CO 3.6, CI 2.0. Wedge 17, mean PA 26, PVR 2.3. - 03/2024 EF 25 to 30%  EF has persistently been depressed despite GDMT although compliance in the past has been questionable and he frequently has run out of medications leading to hospitalizations.  Doing well overall though with stable weights and euvolemic.  NYHA class I/II  GDMT has been limited by financial restraints and CKD: Continue Jardiance  10 mg, Toprol -XL 25 mg, losartan  25 mg.  Continue Lasix  40 mg twice daily.  Had hyperkalemia with spironolactone /Entresto .    Discussed need for close monitoring of weights and signs of congestion If he continues to exhibit persistent reduction in EF despite compliance, wonder if he would be a candidate for CRT.  Already sees EP. Weight 143 to 145 pounds   CAD status post DES to LAD/diagonal Last cardiac catheterization in November 2024 demonstrating diffuse high-grade disease although no targets for intervention.  He has been completely asymptomatic and with very good functional capacity and walks everywhere and to work.  No plans for any intervention as long as he remains asymptomatic. Continue with atorvastatin  80 mg, Toprol -XL 25 mg Stopping aspirin  per Dr. Tyrone previous outpatient note.   Repeat lipid panel today, overdue.  Goal less than 55.  VT arrest status post Medtronic ICD Normal device check 06/2024.  Remote history of monomorphic VT during hospitalization July 2022 with inducible arrhythmia on EP study.   Paroxysmal atrial fibrillation Low burden.  Maintaining NSR.   Continue with Eliquis  5 mg twice daily and Toprol -XL   MR Mild on echo 03/2024.  Suspected to be functional. Repeat echocardiogram 1 to 2 years.  Dispo: 29-month follow-up with Dr. Francyne.  Need to ensure that he is still compliant with all of his medications.  Ensured  today that he has plenty of refills.   Signed, Thom LITTIE Sluder, PA-C

## 2024-07-05 NOTE — Patient Instructions (Addendum)
 Medication Instructions:  STOP Aspirin  *If you need a refill on your cardiac medications before your next appointment, please call your pharmacy*  Instrucciones para la medicacin: STOP a la aspirina *Si necesita renovar su medicacin para el corazn antes de su prxima cita, llame a su farmacia*   Lab Work: TODAY-LIPIDS If you have labs (blood work) drawn today and your tests are completely normal, you will receive your results only by: [employer] (if you have MyChart) OR A paper copy in the mail If you have any lab test that is abnormal or we need to change your treatment, we will call you to review the results.  Anlisis de laboratorio: HOY - LPIDOS Si hoy se realiza un anlisis de sangre y sus resultados son completamente normales, recibir los [place] solo por:  Mensaje de MyChart (si tiene MyChart) O  Una copia impresa por correo Si algn resultado de su anlisis de laboratorio es anormal o necesitamos cambiar su tratamiento, le llamaremos para revisar los [place].  Testing/Procedures: NONE ORDERED  Pruebas/Procedimientos: NO SE ORDENA  Follow-Up: At [facility], you and your health needs are our priority.  As part of our continuing mission to provide you with exceptional heart care, our providers are all part of one team.  This team includes your primary Cardiologist (physician) and Advanced Practice Providers or APPs (Physician Assistants and Nurse Practitioners) who all work together to provide you with the care you need, when you need it.  Seguimiento: En [employer], usted y sus necesidades de salud son landry prioridad. Como parte de nuestra misin continua de brindarle una atencin cardaca excepcional, nuestros proveedores forman parte de un mismo equipo. Este equipo incluye a su cardilogo de cabecera (mdico) y a los profesionales de la salud (APP, por sus siglas en ingls) que trabajan juntos para brindarle la atencin que necesita,  cuando la necesita.  Your next appointment:   4 month(s)  Su prxima cita:  4 meses  Provider:   Jerel Balding, MD    Proveedor:  Dr. Jerel Balding  We recommend signing up for the patient portal called MyChart.  Sign up information is provided on this After Visit Summary.  MyChart is used to connect with patients for Virtual Visits (Telemedicine).  Patients are able to view lab/test results, encounter notes, upcoming appointments, etc.  Non-urgent messages can be sent to your provider as well.   To learn more about what you can do with MyChart, go to [email or web address].   Le recomendamos registrarse en el portal para Investment banker, corporate. La informacin de Materials engineer en este Resumen Posterior a la Visita. MyChart se utiliza para conectar con pacientes para Consultas Virtuales (Telemedicina). Los [employer] de anlisis de laboratorio, notas de la [place], prximas citas, etc. Tambin pueden enviar mensajes no urgentes a su proveedor.  Para obtener ms informacin sobre las funciones de MyChart, visite [email or web address].   Other Instructions

## 2024-07-12 ENCOUNTER — Telehealth: Payer: Self-pay | Admitting: Pharmacy Technician

## 2024-07-12 NOTE — Progress Notes (Signed)
Remote ICD Transmission.

## 2024-07-13 NOTE — Progress Notes (Signed)
 Maybe. But QRS is only 128 ms and not typical LBBB. May not benefit much from CRT

## 2024-08-04 ENCOUNTER — Encounter (HOSPITAL_COMMUNITY): Payer: Self-pay

## 2024-08-04 ENCOUNTER — Emergency Department (HOSPITAL_COMMUNITY): Payer: Self-pay

## 2024-08-04 ENCOUNTER — Ambulatory Visit: Payer: Self-pay | Attending: Cardiovascular Disease | Admitting: Cardiovascular Disease

## 2024-08-04 ENCOUNTER — Inpatient Hospital Stay (HOSPITAL_COMMUNITY)
Admission: EM | Admit: 2024-08-04 | Discharge: 2024-08-06 | DRG: 291 | Disposition: A | Payer: Self-pay | Attending: Hospitalist | Admitting: Hospitalist

## 2024-08-04 ENCOUNTER — Other Ambulatory Visit: Payer: Self-pay

## 2024-08-04 DIAGNOSIS — N179 Acute kidney failure, unspecified: Secondary | ICD-10-CM | POA: Diagnosis present

## 2024-08-04 DIAGNOSIS — I509 Heart failure, unspecified: Principal | ICD-10-CM

## 2024-08-04 DIAGNOSIS — Z8674 Personal history of sudden cardiac arrest: Secondary | ICD-10-CM

## 2024-08-04 DIAGNOSIS — E7849 Other hyperlipidemia: Secondary | ICD-10-CM | POA: Diagnosis present

## 2024-08-04 DIAGNOSIS — N1831 Chronic kidney disease, stage 3a: Secondary | ICD-10-CM | POA: Diagnosis present

## 2024-08-04 DIAGNOSIS — E875 Hyperkalemia: Secondary | ICD-10-CM | POA: Diagnosis present

## 2024-08-04 DIAGNOSIS — Z79899 Other long term (current) drug therapy: Secondary | ICD-10-CM

## 2024-08-04 DIAGNOSIS — I10 Essential (primary) hypertension: Secondary | ICD-10-CM | POA: Diagnosis present

## 2024-08-04 DIAGNOSIS — E1122 Type 2 diabetes mellitus with diabetic chronic kidney disease: Secondary | ICD-10-CM | POA: Diagnosis present

## 2024-08-04 DIAGNOSIS — I340 Nonrheumatic mitral (valve) insufficiency: Secondary | ICD-10-CM | POA: Diagnosis present

## 2024-08-04 DIAGNOSIS — Z95810 Presence of automatic (implantable) cardiac defibrillator: Secondary | ICD-10-CM

## 2024-08-04 DIAGNOSIS — Z23 Encounter for immunization: Secondary | ICD-10-CM

## 2024-08-04 DIAGNOSIS — E1169 Type 2 diabetes mellitus with other specified complication: Secondary | ICD-10-CM | POA: Diagnosis present

## 2024-08-04 DIAGNOSIS — Z955 Presence of coronary angioplasty implant and graft: Secondary | ICD-10-CM

## 2024-08-04 DIAGNOSIS — I5023 Acute on chronic systolic (congestive) heart failure: Secondary | ICD-10-CM

## 2024-08-04 DIAGNOSIS — Z7901 Long term (current) use of anticoagulants: Secondary | ICD-10-CM

## 2024-08-04 DIAGNOSIS — I447 Left bundle-branch block, unspecified: Secondary | ICD-10-CM | POA: Diagnosis present

## 2024-08-04 DIAGNOSIS — I2510 Atherosclerotic heart disease of native coronary artery without angina pectoris: Secondary | ICD-10-CM | POA: Diagnosis present

## 2024-08-04 DIAGNOSIS — Z833 Family history of diabetes mellitus: Secondary | ICD-10-CM

## 2024-08-04 DIAGNOSIS — I480 Paroxysmal atrial fibrillation: Secondary | ICD-10-CM | POA: Diagnosis present

## 2024-08-04 DIAGNOSIS — I130 Hypertensive heart and chronic kidney disease with heart failure and stage 1 through stage 4 chronic kidney disease, or unspecified chronic kidney disease: Principal | ICD-10-CM | POA: Diagnosis present

## 2024-08-04 LAB — BASIC METABOLIC PANEL WITH GFR
Anion gap: 12 (ref 5–15)
BUN: 46 mg/dL — ABNORMAL HIGH (ref 8–23)
CO2: 22 mmol/L (ref 22–32)
Calcium: 9 mg/dL (ref 8.9–10.3)
Chloride: 103 mmol/L (ref 98–111)
Creatinine, Ser: 1.89 mg/dL — ABNORMAL HIGH (ref 0.61–1.24)
GFR, Estimated: 38 mL/min — ABNORMAL LOW
Glucose, Bld: 180 mg/dL — ABNORMAL HIGH (ref 70–99)
Potassium: 4.3 mmol/L (ref 3.5–5.1)
Sodium: 137 mmol/L (ref 135–145)

## 2024-08-04 LAB — TROPONIN I (HIGH SENSITIVITY)
Troponin I (High Sensitivity): 108 ng/L
Troponin I (High Sensitivity): 97 ng/L — ABNORMAL HIGH (ref <18)

## 2024-08-04 LAB — CBC
HCT: 43.2 % (ref 39.0–52.0)
Hemoglobin: 13.9 g/dL (ref 13.0–17.0)
MCH: 30 pg (ref 26.0–34.0)
MCHC: 32.2 g/dL (ref 30.0–36.0)
MCV: 93.3 fL (ref 80.0–100.0)
Platelets: 137 K/uL — ABNORMAL LOW (ref 150–400)
RBC: 4.63 MIL/uL (ref 4.22–5.81)
RDW: 13.2 % (ref 11.5–15.5)
WBC: 12.2 K/uL — ABNORMAL HIGH (ref 4.0–10.5)
nRBC: 0 % (ref 0.0–0.2)

## 2024-08-04 LAB — PROTIME-INR
INR: 1 (ref 0.8–1.2)
Prothrombin Time: 13.7 s (ref 11.4–15.2)

## 2024-08-04 LAB — BRAIN NATRIURETIC PEPTIDE: B Natriuretic Peptide: 945.2 pg/mL — ABNORMAL HIGH (ref 0.0–100.0)

## 2024-08-04 MED ORDER — ATORVASTATIN CALCIUM 80 MG PO TABS
80.0000 mg | ORAL_TABLET | Freq: Every day | ORAL | Status: DC
  Administered 2024-08-04 – 2024-08-06 (×3): 80 mg via ORAL
  Filled 2024-08-04 (×2): qty 1
  Filled 2024-08-04: qty 8

## 2024-08-04 MED ORDER — DAPAGLIFLOZIN PROPANEDIOL 10 MG PO TABS: 10.0000 mg | ORAL_TABLET | Freq: Every day | ORAL | Status: DC

## 2024-08-04 MED ORDER — POLYETHYLENE GLYCOL 3350 17 GM/SCOOP PO POWD
17.0000 g | Freq: Every day | ORAL | Status: DC
  Filled 2024-08-04: qty 119

## 2024-08-04 MED ORDER — INFLUENZA VAC SPLIT HIGH-DOSE 0.5 ML IM SUSY
0.5000 mL | PREFILLED_SYRINGE | INTRAMUSCULAR | Status: AC
  Administered 2024-08-05: 0.5 mL via INTRAMUSCULAR
  Filled 2024-08-04: qty 0.5

## 2024-08-04 MED ORDER — ASPIRIN 81 MG PO TBEC
81.0000 mg | DELAYED_RELEASE_TABLET | Freq: Every day | ORAL | Status: DC
  Administered 2024-08-04 – 2024-08-06 (×3): 81 mg via ORAL
  Filled 2024-08-04 (×3): qty 1

## 2024-08-04 MED ORDER — RIVAROXABAN 10 MG PO TABS: 20.0000 mg | ORAL_TABLET | Freq: Every day | ORAL | Status: DC

## 2024-08-04 MED ORDER — FUROSEMIDE 10 MG/ML IJ SOLN
40.0000 mg | Freq: Once | INTRAMUSCULAR | Status: AC
  Administered 2024-08-04: 40 mg via INTRAVENOUS
  Filled 2024-08-04: qty 4

## 2024-08-04 MED ORDER — LOSARTAN POTASSIUM 50 MG PO TABS: 25.0000 mg | ORAL_TABLET | Freq: Every day | ORAL | Status: DC

## 2024-08-04 MED ORDER — RIVAROXABAN 15 MG PO TABS
15.0000 mg | ORAL_TABLET | Freq: Every day | ORAL | Status: DC
  Administered 2024-08-05 – 2024-08-06 (×2): 15 mg via ORAL
  Filled 2024-08-04 (×2): qty 1

## 2024-08-04 MED ORDER — ENOXAPARIN SODIUM 40 MG/0.4ML IJ SOSY: 40.0000 mg | PREFILLED_SYRINGE | INTRAMUSCULAR | Status: DC

## 2024-08-04 MED ORDER — METOPROLOL SUCCINATE ER 25 MG PO TB24
25.0000 mg | ORAL_TABLET | Freq: Every day | ORAL | Status: DC
  Administered 2024-08-04 – 2024-08-06 (×3): 25 mg via ORAL
  Filled 2024-08-04 (×3): qty 1

## 2024-08-04 MED ORDER — FUROSEMIDE 10 MG/ML IJ SOLN
40.0000 mg | Freq: Two times a day (BID) | INTRAMUSCULAR | Status: DC
  Administered 2024-08-04 – 2024-08-05 (×3): 40 mg via INTRAVENOUS
  Filled 2024-08-04 (×4): qty 4

## 2024-08-04 NOTE — ED Notes (Signed)
 CCMD called.

## 2024-08-04 NOTE — ED Provider Notes (Signed)
  Physical Exam  BP 115/69   Pulse (!) 59   Temp 97.6 F (36.4 C) (Oral)   Resp 20   SpO2 98%   Physical Exam  Procedures  Procedures  ED Course / MDM   Clinical Course as of 08/04/24 0655  Fri Aug 04, 2024  [id number] Dr Charlton accepts admission for CHF. [JB]  [id number] Sign out pending admission for CHF. SOB, CP, elevated BNP with some CHF on x-ray. [JB]    Clinical Course User Index [JB] Marializ Ferrebee, Warren SAILOR, PA-C   Medical Decision Making Amount and/or Complexity of Data Reviewed Labs: ordered. Radiology: ordered.  Risk Prescription drug management. Decision regarding hospitalization.   Recevied in sign out from Taylor PA-s at shift change.  Last echo showing EG of 23-30% this was in June, hx of CHF, cardiac arrest, CAD.  Patient admitted to hospital team.        Shermon Warren SAILOR, PA-C 08/04/24 [id number]    Theadore Ozell HERO, MD 08/04/24 [phone]

## 2024-08-04 NOTE — ED Triage Notes (Addendum)
 Patient reports central chest pain with SOB and occasional dry cough this evening , no emesis or diaphoresis . History of CAD/CHF/ ICD implant.

## 2024-08-04 NOTE — ED Notes (Signed)
 Patient transported to X-ray

## 2024-08-04 NOTE — H&P (Addendum)
 History and Physical    Patient: Pedro Kemp DOB: 1955-11-06 DOA: 08/04/2024 DOS: the patient was seen and examined on 08/04/2024 PCP: Celestia Rosaline SQUIBB, NP  Patient coming from: Home  Chief Complaint:  Chief Complaint  Patient presents with   Chest Pain    Hx: CAD/CHF   Shortness of Breath   HPI: Pedro Kemp is a 68 y.o. male with medical history significant of HTN, T2DM, CKD 3A, CAD, HFrEF (EF 25-30% in 03/2024), PAF on Eliquis  and hx of VT arrest now with ICD who presents with chest pain and SOB and found to have HFrEF exacerbation.  The patient reported experiencing shortness of breath that began while sleeping at home. The sensation felt as if something was stuck, obstructing the patient's breathing. The patient stood up to alleviate the symptoms but did not get any relief; of note, he did not experience any lightheadedness or dizziness, and did not fall. The shortness of breath did not improve over time, leading the patient to decide to seek medical attention. The patient contacted a nephew who brought them to the hospital.   In the ED, pt bradycardic and normotensive. Labs notable for Cr 1.89 (baseline 1.3-1.5), WBC 12.2, trop 108-->97, and BNP 945. CXR shows vascular congestion with interstitial edema and small left pleural effusion. EDP administered IV lasix  40mg  x1 and requested medicine admission for ADHF.      Review of Systems: As mentioned in the history of present illness. All other systems reviewed and are negative. Past Medical History:  Diagnosis Date   Cardiac arrest 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   Past Surgical History:  Procedure Laterality Date   CARDIAC CATHETERIZATION     ICD IMPLANT     RIGHT/LEFT HEART CATH AND CORONARY ANGIOGRAPHY N/A  08/24/2023   Procedure: RIGHT/LEFT HEART CATH AND CORONARY ANGIOGRAPHY;  Surgeon: Wendel Lurena POUR, MD;  Location: [facility]VASIVE [facility];  Service: Cardiovascular;  Laterality: N/A;   Social History:  reports that he has never smoked. He has never been exposed to tobacco smoke. He has never used smokeless tobacco. He reports that he does not drink alcohol and does not use drugs.  No Known Allergies  Family History  Problem Relation Age of Onset   Diabetes Mother    Diabetes Brother     Prior to Admission medications   Medication Sig Start Date End Date Taking? Authorizing Provider  atorvastatin  (LIPITOR ) 80 MG tablet Take 1 tablet (80 mg total) by mouth daily. 05/05/24   Croitoru, Mihai, MD  dapagliflozin  propanediol (FARXIGA ) 10 MG TABS tablet Take 1 tablet (10 mg total) by mouth daily before breakfast. 05/05/24   Croitoru, Mihai, MD  furosemide  (LASIX ) 40 MG tablet Take 1 tablet (40 mg total) by mouth 2 (two) times daily. 05/05/24   Croitoru, Mihai, MD  losartan  (COZAAR ) 25 MG tablet Take 1 tablet (25 mg total) by mouth daily. 05/05/24   Croitoru, Mihai, MD  metoprolol  succinate (TOPROL -XL) 25 MG 24 hr tablet Take 1 tablet (25 mg total) by mouth daily. 05/05/24   Croitoru, Mihai, MD  polyethylene glycol powder (GLYCOLAX /MIRALAX ) 17 GM/SCOOP powder Take 17 g by mouth daily. 04/15/24   Regalado, Belkys A, MD  rivaroxaban  (XARELTO ) 20 MG TABS tablet Take 1 tablet (20  mg total) by mouth daily wtih food. 05/05/24   Croitoru, Jerel, MD    Physical Exam: Vitals:   08/04/24 0545 08/04/24 0600 08/04/24 0615 08/04/24 0740  BP: 115/64 113/63 115/69 117/71  Pulse: (!) 58 69 (!) 59 60  Resp: 20 (!) 31 20 18   Temp:    97.8 F (36.6 C)  TempSrc:    Oral  SpO2: 97% 98% 98%    General: Alert, oriented x3, resting comfortably in no acute distress Respiratory: Bibasilar rhonchi; no wheezing Cardiovascular: Regular rate and rhythm w/o m/r/g   Data Reviewed:  Lab Results  Component Value Date   WBC  12.2 (H) 08/04/2024   HGB 13.9 08/04/2024   HCT 43.2 08/04/2024   MCV 93.3 08/04/2024   PLT 137 (L) 08/04/2024   Lab Results  Component Value Date   GLUCOSE 180 (H) 08/04/2024   CALCIUM  9.0 08/04/2024   NA 137 08/04/2024   K 4.3 08/04/2024   CO2 22 08/04/2024   CL 103 08/04/2024   BUN 46 (H) 08/04/2024   CREATININE 1.89 (H) 08/04/2024   Lab Results  Component Value Date   ALT 48 (H) 04/13/2024   AST 45 (H) 04/13/2024   ALKPHOS 69 04/13/2024   BILITOT 1.0 04/13/2024   Lab Results  Component Value Date   INR 1.0 08/04/2024   INR 1.1 01/07/2024   Radiology: DG Chest 2 View Result Date: 08/04/2024 CLINICAL DATA:  Chest pain EXAM: CHEST - 2 VIEW COMPARISON:  06/29/2024 FINDINGS: The cardiopericardial silhouette is within normal limits for size. Vascular congestion diffuse interstitial opacity suggest underlying edema. Small left pleural effusion. Left-sided permanent pacemaker/AICD remains in place. No acute bony abnormality. IMPRESSION: 1. Vascular congestion with diffuse interstitial opacity suggesting edema. 2. Small left pleural effusion. Electronically Signed   By: Camellia Candle M.D.   On: 08/04/2024 04:59    Assessment and Plan: 70M h/o HTN, T2DM, CKD 3A, CAD, HFrEF (EF 25-30% in 03/2024), PAF on Eliquis  and hx of VT arrest now with ICD who presents with chest pain and SOB and found to have HFrEF exacerbation.  AKI Presumed renovascular congestion from HFrEF exacerbation -Diuresis per below -HOLD pta losartan  -Strict I&Os and daily weights (standing preferred) -F/u BMP daily -Renally dose medications for CrCl -Avoid lovenox , NSAIDs, morphine , Fleet's phosphate enema, regular insulin , contrast; no gadolinium for MRI to avoid nephrogenic systemic fibrosis -Consider renal US  and nephrology consult if worsening AKI  HFrEF exacerbation Elevated BNP -Heart failure team consulted; apprec eval/recs -RD consulted for low salt diet; apprec eval/recs -IV lasix  40mg  BID for  now; goal net neg 1-2L/d; strict I/Os; daily standing weights; K>4/Mg>2 -PTA metoprolol  XL 25mg  daily, and atorvastatin  80mg  daily -Low sodium diet  RLE swelling -F/u RLE US  to eval for DVT  PAF H/o VT arrest s/p ICD -PTA metoprolol  and Xarelto   HTN -Losartan  on hold per AKI above   Advance Care Planning:   Code Status: Full Code   Consults: N/A  Family Communication: N/A  Severity of Illness: The appropriate patient status for this patient is INPATIENT. Inpatient status is judged to be reasonable and necessary in order to provide the required intensity of service to ensure the patient's safety. The patient's presenting symptoms, physical exam findings, and initial radiographic and laboratory data in the context of their chronic comorbidities is felt to place them at high risk for further clinical deterioration. Furthermore, it is not anticipated that the patient will be medically stable for discharge from the hospital within 2 midnights  of admission.   * I certify that at the point of admission it is my clinical judgment that the patient will require inpatient hospital care spanning beyond 2 midnights from the point of admission due to high intensity of service, high risk for further deterioration and high frequency of surveillance required.*   ------- I spent 61 minutes reviewing previous notes, at the bedside counseling/discussing the treatment plan, and performing clinical documentation.   Author: Marsha Ada, MD 08/04/2024 9:07 AM  For on call review [email or web address].

## 2024-08-04 NOTE — Progress Notes (Signed)
 Patient stated that last month when he was discharged from ER he had a headache and got confused he didn't know where he was.

## 2024-08-04 NOTE — Plan of Care (Signed)

## 2024-08-04 NOTE — ED Provider Notes (Signed)
 [place] EMERGENCY DEPARTMENT AT [facility] Provider Note   CSN: [id number] Arrival date & time: 08/04/24  9661     Patient presents with: Chest Pain (Hx: CAD/CHF) and Shortness of Breath   Pedro Kemp is a 68 y.o. male.  Past medical history significant for diabetes, CHF, NSTEMI, and A-fib presents today for shortness of breath and dry cough that began earlier this evening.  Patient denies chest pain, nausea, vomiting, fever, chills, any other complaints at this time.  HPI gathered using certified language interpreter.    Chest Pain Associated symptoms: cough and shortness of breath   Shortness of Breath Associated symptoms: chest pain and cough        Prior to Admission medications   Medication Sig Start Date End Date Taking? Authorizing Provider  atorvastatin  (LIPITOR ) 80 MG tablet Take 1 tablet (80 mg total) by mouth daily. 05/05/24   Croitoru, Mihai, MD  dapagliflozin  propanediol (FARXIGA ) 10 MG TABS tablet Take 1 tablet (10 mg total) by mouth daily before breakfast. 05/05/24   Croitoru, Mihai, MD  furosemide  (LASIX ) 40 MG tablet Take 1 tablet (40 mg total) by mouth 2 (two) times daily. 05/05/24   Croitoru, Mihai, MD  losartan  (COZAAR ) 25 MG tablet Take 1 tablet (25 mg total) by mouth daily. 05/05/24   Croitoru, Mihai, MD  metoprolol  succinate (TOPROL -XL) 25 MG 24 hr tablet Take 1 tablet (25 mg total) by mouth daily. 05/05/24   Croitoru, Mihai, MD  polyethylene glycol powder (GLYCOLAX /MIRALAX ) 17 GM/SCOOP powder Take 17 g by mouth daily. 04/15/24   Regalado, Belkys A, MD  rivaroxaban  (XARELTO ) 20 MG TABS tablet Take 1 tablet (20 mg total) by mouth daily wtih food. 05/05/24   Croitoru, Jerel, MD    Allergies: Patient has no known allergies.    Review of Systems  Respiratory:  Positive for cough and shortness of breath.   Cardiovascular:  Positive for chest pain.    Updated Vital Signs BP 115/69   Pulse (!) 59   Temp 97.6 F (36.4 C) (Oral)   Resp 20    SpO2 98%   Physical Exam Vitals and nursing note reviewed.  Constitutional:      General: He is not in acute distress.    Appearance: He is well-developed.  HENT:     Head: Normocephalic and atraumatic.  Eyes:     Extraocular Movements: Extraocular movements intact.     Conjunctiva/sclera: Conjunctivae normal.  Cardiovascular:     Rate and Rhythm: Normal rate and regular rhythm.     Heart sounds: Normal heart sounds. No murmur heard. Pulmonary:     Effort: Pulmonary effort is normal. No respiratory distress.     Breath sounds: Normal breath sounds. No wheezing.  Abdominal:     Palpations: Abdomen is soft.     Tenderness: There is no abdominal tenderness.  Musculoskeletal:        General: No swelling.     Cervical back: Neck supple.     Right lower leg: No edema.     Left lower leg: No edema.  Skin:    General: Skin is warm and dry.     Capillary Refill: Capillary refill takes less than 2 seconds.  Neurological:     Mental Status: He is alert.  Psychiatric:        Mood and Affect: Mood normal.     (all labs ordered are listed, but only abnormal results are displayed) Labs Reviewed  BASIC METABOLIC PANEL WITH GFR - Abnormal;  Notable for the following components:      Result Value   Glucose, Bld 180 (*)    BUN 46 (*)    Creatinine, Ser 1.89 (*)    GFR, Estimated 38 (*)    All other components within normal limits  CBC - Abnormal; Notable for the following components:   WBC 12.2 (*)    Platelets 137 (*)    All other components within normal limits  BRAIN NATRIURETIC PEPTIDE - Abnormal; Notable for the following components:   B Natriuretic Peptide 945.2 (*)    All other components within normal limits  TROPONIN I (HIGH SENSITIVITY) - Abnormal; Notable for the following components:   Troponin I (High Sensitivity) 108 (*)    All other components within normal limits  TROPONIN I (HIGH SENSITIVITY) - Abnormal; Notable for the following components:   Troponin I (High  Sensitivity) 97 (*)    All other components within normal limits  PROTIME-INR    EKG: None  Radiology: DG Chest 2 View Result Date: 08/04/2024 CLINICAL DATA:  Chest pain EXAM: CHEST - 2 VIEW COMPARISON:  06/29/2024 FINDINGS: The cardiopericardial silhouette is within normal limits for size. Vascular congestion diffuse interstitial opacity suggest underlying edema. Small left pleural effusion. Left-sided permanent pacemaker/AICD remains in place. No acute bony abnormality. IMPRESSION: 1. Vascular congestion with diffuse interstitial opacity suggesting edema. 2. Small left pleural effusion. Electronically Signed   By: Camellia Candle M.D.   On: 08/04/2024 04:59     Procedures   Medications Ordered in the ED  furosemide  (LASIX ) injection 40 mg (40 mg Intravenous Given 08/04/24 9378)                                    Medical Decision Making Amount and/or Complexity of Data Reviewed Labs: ordered. Radiology: ordered.   This patient presents to the ED for concern of shortness of breath, this involves an extensive number of treatment options, and is a complaint that carries with it a high risk of complications and morbidity.  The differential diagnosis includes STEMI, NSTEMI, arrhythmia, anemia, electrolyte abnormality, CHF exacerbation   Co morbidities / Chronic conditions that complicate the patient evaluation  Diabetes, A-fib, CHF, NSTEMI   Additional history obtained:  Additional history obtained from EMR External records from outside source obtained and reviewed including review of admissions   Lab Tests:  I Ordered, and personally interpreted labs.  The pertinent results include: Elevated glucose at 180, elevated bun at 46, elevated creatinine at 1.89 from a baseline of approximately 1.40, elevated BNP 945.2, troponin 108, 97, leukocytosis at 12.2, thrombocytopenia 137, pro time and INR WNL   Imaging Studies ordered:  I ordered imaging studies including x-ray I  independently visualized and interpreted imaging which showed vascular congestion with diffuse interstitial opacities suggesting edema.  Small left pleural effusion. I agree with the radiologist interpretation   Cardiac Monitoring: / EKG:  The patient was maintained on a cardiac monitor.  I personally viewed and interpreted the cardiac monitored which showed an underlying rhythm of: Sinus rhythm with PVC   Problem List / ED Course / Critical interventions / Medication management  I ordered medication including Lasix    I have reviewed the patients home medicines and have made adjustments as needed  Patient signed out to Warren Shad, PA-C pending admission.     Final diagnoses:  Acute on chronic congestive heart failure, unspecified heart failure type (HCC)  AKI (acute kidney injury)    ED Discharge Orders     None          Francis Ileana SAILOR, PA-C 08/04/24 9370    Theadore Ozell HERO, MD 08/04/24 [phone]

## 2024-08-05 ENCOUNTER — Inpatient Hospital Stay (HOSPITAL_COMMUNITY): Payer: Self-pay

## 2024-08-05 DIAGNOSIS — E785 Hyperlipidemia, unspecified: Secondary | ICD-10-CM

## 2024-08-05 DIAGNOSIS — E1169 Type 2 diabetes mellitus with other specified complication: Secondary | ICD-10-CM

## 2024-08-05 DIAGNOSIS — I10 Essential (primary) hypertension: Secondary | ICD-10-CM

## 2024-08-05 DIAGNOSIS — I2510 Atherosclerotic heart disease of native coronary artery without angina pectoris: Secondary | ICD-10-CM

## 2024-08-05 DIAGNOSIS — N1831 Chronic kidney disease, stage 3a: Secondary | ICD-10-CM

## 2024-08-05 DIAGNOSIS — I480 Paroxysmal atrial fibrillation: Secondary | ICD-10-CM

## 2024-08-05 LAB — BASIC METABOLIC PANEL WITH GFR
Anion gap: 12 (ref 5–15)
BUN: 40 mg/dL — ABNORMAL HIGH (ref 8–23)
CO2: 24 mmol/L (ref 22–32)
Calcium: 8.9 mg/dL (ref 8.9–10.3)
Chloride: 101 mmol/L (ref 98–111)
Creatinine, Ser: 1.42 mg/dL — ABNORMAL HIGH (ref 0.61–1.24)
GFR, Estimated: 54 mL/min — ABNORMAL LOW (ref >60)
Glucose, Bld: 221 mg/dL — ABNORMAL HIGH (ref 70–99)
Potassium: 3.9 mmol/L (ref 3.5–5.1)
Sodium: 137 mmol/L (ref 135–145)

## 2024-08-05 MED ORDER — LOSARTAN POTASSIUM 25 MG PO TABS
25.0000 mg | ORAL_TABLET | Freq: Every day | ORAL | Status: DC
  Administered 2024-08-06: 25 mg via ORAL
  Filled 2024-08-05 (×2): qty 1

## 2024-08-05 MED ORDER — DAPAGLIFLOZIN PROPANEDIOL 10 MG PO TABS
10.0000 mg | ORAL_TABLET | Freq: Every day | ORAL | Status: DC
  Administered 2024-08-05 – 2024-08-06 (×2): 10 mg via ORAL
  Filled 2024-08-05 (×2): qty 1

## 2024-08-05 NOTE — Assessment & Plan Note (Signed)
No chest pain, no acute coronary syndrome.  

## 2024-08-05 NOTE — Plan of Care (Signed)
  Problem: Clinical Measurements: Goal: Respiratory complications will improve Outcome: Progressing Goal: Cardiovascular complication will be avoided Outcome: Progressing   Problem: Activity: Goal: Risk for activity intolerance will decrease Outcome: Progressing   Problem: Elimination: Goal: Will not experience complications related to bowel motility Outcome: Progressing Goal: Will not experience complications related to urinary retention Outcome: Progressing   Problem: Safety: Goal: Ability to remain free from injury will improve Outcome: Progressing

## 2024-08-05 NOTE — Assessment & Plan Note (Addendum)
 AKI   Volume status has improved, at the time of discharge his renal function has a serum cr of 1,37 with K at 3,7 and serum bicarbonate at 26 Na 138 and Mg 2,5  Plan to continue diuresis with furosemide  and follow up renal function and electrolytes as outpatient.

## 2024-08-05 NOTE — Progress Notes (Incomplete)
  Progress Note   Patient: Pedro Kemp FMW:[id number] DOB: [birth year] DOA: 08/04/2024     1 DOS: the patient was seen and examined on 08/05/2024   Brief hospital course: [name] was admitted to the hospital with the working diagnosis of heart failure exacerbation   68 yo male with the past medical history of hypertension, ckd, heart failure, paroxysmal atrial fibrillation and coronary artery disease, who presented with dyspnea. Reported a rapid and progressive dyspnea, started with a severe episode of PND, because persistent symptoms he came to the ED for further evaluation.  On her initial physical examination his blood pressure was 115/64, HR 58, RR 31 and 02 saturation 97%, lungs with bilateral rhonchi with no wheezing, heart with S1 and S2 present and regular with no gallops or rubs, abdomen with no distention and no lower extremity edema.   Na 137, K 4.3 Cl 103 bicarbonate 22, glucose 180 bun 46 and cr 1,89  BNP 945  High sensitive troponin 108 and 97  Wbc 12.2 hgb 13,9 plt 137   Chest radiograph with bilateral hilar vascular congestion, cephalization of the vasculature, with bilateral central interstitial infiltrates, bilateral small bilateral pleural effusions. 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   EKG 61 bpm, normal axis, qtc 430, left bundle branch block, intermittent atrial pacing with ventricular sensing, bilateral atrial enlargement, J point elevation V1 to V4, no significant ST segment or T wave changes.   Assessment and Plan: No notes have been filed under this hospital service. Service: Hospitalist     {Tip this will not be part of the note when signed Body mass index is 23.49 kg/m. , ,  (Optional):26781}  Subjective: ***  Physical Exam: Vitals:   08/05/24 0524 08/05/24 0745 08/05/24 0852 08/05/24 1250  BP:  107/61 107/61   Pulse:  (!) 57 70 (!) 57  Resp:  18 18   Temp:  99.7 F (37.6 C) 99.7 F (37.6 C)    TempSrc:  Oral Oral Oral  SpO2:  95% 98%   Weight: 62.4 kg     Height:       *** Data Reviewed: {Tip this will not be part of the note when signed- Document your independent interpretation of telemetry tracing, EKG, lab, Radiology test or any other diagnostic tests. Add any new diagnostic test ordered today. (Optional):26781} {Results:26384}  Family Communication: ***  Disposition: Status is: Inpatient {Inpatient:23812}  Planned Discharge Destination: {DISCHARGE DESTINATION_TRH:27031} {Tip this will not be part of the note when signed  DVT Prophylaxis  .Rivaroxaban  (xarelto ) tablet 15 mg ,  (Optional):26781}   Time spent: *** minutes  Author: Elidia Toribio Furnace, MD 08/05/2024 4:34 PM  For on call review [email or web address].

## 2024-08-05 NOTE — Assessment & Plan Note (Signed)
 Continue rate control with metoprolol  and anticoagulation with rivaroxaban ,

## 2024-08-05 NOTE — Progress Notes (Addendum)
 Progress Note   Patient: Pedro Kemp FMW:[id number] DOB: [birth year] DOA: 08/04/2024     1 DOS: the patient was seen and examined on 08/05/2024   Brief hospital course: [name] was admitted to the hospital with the working diagnosis of heart failure exacerbation   68 yo male with the past medical history of hypertension, ckd, heart failure, paroxysmal atrial fibrillation and coronary artery disease, who presented with dyspnea. Reported a rapid and progressive dyspnea, started with a severe episode of PND, because persistent symptoms he came to the ED for further evaluation.  On her initial physical examination his blood pressure was 115/64, HR 58, RR 31 and 02 saturation 97%, lungs with bilateral rhonchi with no wheezing, heart with S1 and S2 present and regular with no gallops or rubs, abdomen with no distention and no lower extremity edema.   Na 137, K 4.3 Cl 103 bicarbonate 22, glucose 180 bun 46 and cr 1,89  BNP 945  High sensitive troponin 108 and 97  Wbc 12.2 hgb 13,9 plt 137   Chest radiograph with bilateral hilar vascular congestion, cephalization of the vasculature, with bilateral central interstitial infiltrates, bilateral small bilateral pleural effusions. 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   EKG 61 bpm, normal axis, qtc 430, left bundle branch block, intermittent atrial pacing with ventricular sensing, bilateral atrial enlargement, J point elevation V1 to V4, no significant ST segment or T wave changes.   Assessment and Plan: * Acute on chronic systolic congestive heart failure (HCC) Echocardiogram with reduced LV systolic function with EF 25 to 30%, mild LV cavity dilatation, RV systolic function low normal, LA with severe dilatation, mild mitral valve regurgitation.  Akinetic LV anterior, anteroseptal and anterloateral walls.   Urine output is 3,500 ml Systolic blood pressure 100 mmHg.   Plan to continue metoprolol   succinate, resume SGLT 2 inh and losartan  Had hyperkalemia with spironolactone  and entresto .  Possible addition of digoxin  Check limited echocardiogram.   Essential hypertension Continue blood pressure control with losartan  and metoprolol .   Paroxysmal atrial fibrillation (HCC) Continue rate control with metoprolol  and anticoagulation with rivaroxaban ,   Coronary artery disease No chest pain, no acute coronary syndrome   Stage 3a chronic kidney disease (HCC) AKI   Renal function with serum cr at 1.89 with K at 4,3 and serum bicarbonate at 22  Na 137  Follow up renal function in am Hold on loop diuretic for pm dose.   Type 2 diabetes mellitus with hyperlipidemia (HCC) Continue glucose cover and monitoring with insulin  sliding scale.  Patient is tolerating po well  Fasting glucose 180 mg/dl  Continue statin        Subjective: Patient is feeling better, dyspnea and edema have improved, no nausea or vomiting   Physical Exam: Vitals:   08/05/24 0745 08/05/24 0852 08/05/24 1250 08/05/24 1555  BP: 107/61 107/61  (!) 100/55  Pulse: (!) 57 70 (!) 57 (!) 118  Resp: 18 18  18   Temp: 99.7 F (37.6 C) 99.7 F (37.6 C)  97.6 F (36.4 C)  TempSrc: Oral Oral Oral Oral  SpO2: 95% 98%  96%  Weight:      Height:       Neurology awake and alert ENT with no pallor  Cardiovascular with S1 and S2 present and regular with no gallops, rubs or murmurs No JVD Respiratory with no rales or wheezing Abdomen with no distention No lower extremity edema 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

## 2024-08-05 NOTE — Hospital Course (Signed)
 [name] was admitted to the hospital with the working diagnosis of heart failure exacerbation   68 yo male with the past medical history of hypertension, ckd, heart failure, paroxysmal atrial fibrillation and coronary artery disease, who presented with dyspnea. Reported a rapid and progressive dyspnea, started with a severe episode of PND, because persistent symptoms he came to the ED for further evaluation.  On her initial physical examination his blood pressure was 115/64, HR 58, RR 31 and 02 saturation 97%, lungs with bilateral rhonchi with no wheezing, heart with S1 and S2 present and regular with no gallops or rubs, abdomen with no distention and no lower extremity edema.   Na 137, K 4.3 Cl 103 bicarbonate 22, glucose 180 bun 46 and cr 1,89  BNP 945  High sensitive troponin 108 and 97  Wbc 12.2 hgb 13,9 plt 137   Chest radiograph with bilateral hilar vascular congestion, cephalization of the vasculature, with bilateral central interstitial infiltrates, bilateral small bilateral pleural effusions. 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   EKG 61 bpm, normal axis, qtc 430, left bundle branch block, intermittent atrial pacing with ventricular sensing, bilateral atrial enlargement, J point elevation V1 to V4, no significant ST segment or T wave changes.   Patient was placed on IV furosemide  with improvement in volume status.  Limited echocardiogram with stable reduced LV systolic function.  10/19 plan for discharge home and follow up as outpatient.

## 2024-08-05 NOTE — Assessment & Plan Note (Addendum)
 Echocardiogram with reduced LV systolic function with EF 25 to 30%, mild LV cavity dilatation, RV systolic function low normal, LA with severe dilatation, mild mitral valve regurgitation.  Akinetic LV anterior, anteroseptal and anterloateral walls.   Urine output is 3,500 ml Systolic blood pressure 100 mmHg.   Plan to continue metoprolol  succinate, resume SGLT 2 inh and losartan  Had hyperkalemia with spironolactone  and entresto .  Possible addition of digoxin  Check limited echocardiogram.

## 2024-08-05 NOTE — Assessment & Plan Note (Signed)
 Patient was placed on insulin  sliding scale for glucose cover and monitoring Glucose remained stable.  Fasting glucose 106 mg/dl  Continue statin

## 2024-08-05 NOTE — Assessment & Plan Note (Signed)
Continue blood pressure control with losartan and metoprolol.  ?

## 2024-08-06 ENCOUNTER — Other Ambulatory Visit (HOSPITAL_COMMUNITY): Payer: Self-pay

## 2024-08-06 ENCOUNTER — Inpatient Hospital Stay (HOSPITAL_COMMUNITY): Payer: Self-pay

## 2024-08-06 DIAGNOSIS — R0609 Other forms of dyspnea: Secondary | ICD-10-CM

## 2024-08-06 LAB — ECHOCARDIOGRAM LIMITED
Area-P 1/2: 3.19 cm2
Calc EF: 29.7 %
Height: 64.173 in
S' Lateral: 5 cm
Single Plane A2C EF: 27.8 %
Single Plane A4C EF: 26.2 %
Weight: 2186.96 [oz_av]

## 2024-08-06 LAB — BASIC METABOLIC PANEL WITH GFR
Anion gap: 12 (ref 5–15)
BUN: 37 mg/dL — ABNORMAL HIGH (ref 8–23)
CO2: 26 mmol/L (ref 22–32)
Calcium: 8.9 mg/dL (ref 8.9–10.3)
Chloride: 100 mmol/L (ref 98–111)
Creatinine, Ser: 1.37 mg/dL — ABNORMAL HIGH (ref 0.61–1.24)
GFR, Estimated: 57 mL/min — ABNORMAL LOW (ref >60)
Glucose, Bld: 106 mg/dL — ABNORMAL HIGH (ref 70–99)
Potassium: 3.7 mmol/L (ref 3.5–5.1)
Sodium: 138 mmol/L (ref 135–145)

## 2024-08-06 LAB — MAGNESIUM: Magnesium: 2.5 mg/dL — ABNORMAL HIGH (ref 1.7–2.4)

## 2024-08-06 MED ORDER — PERFLUTREN LIPID MICROSPHERE
1.0000 mL | INTRAVENOUS | Status: AC | PRN
  Administered 2024-08-06: 2 mL via INTRAVENOUS

## 2024-08-06 MED ORDER — FUROSEMIDE 40 MG PO TABS
40.0000 mg | ORAL_TABLET | Freq: Two times a day (BID) | ORAL | 0 refills | Status: DC
  Filled 2024-08-06: qty 90, 45d supply, fill #0

## 2024-08-06 MED ORDER — ADULT MULTIVITAMIN W/MINERALS CH: 1.0000 | ORAL_TABLET | Freq: Every day | ORAL | Status: DC

## 2024-08-06 MED ORDER — FUROSEMIDE 40 MG PO TABS: 40.0000 mg | ORAL_TABLET | Freq: Two times a day (BID) | ORAL | Status: DC

## 2024-08-06 MED ORDER — POTASSIUM CHLORIDE CRYS ER 20 MEQ PO TBCR
40.0000 meq | EXTENDED_RELEASE_TABLET | Freq: Once | ORAL | Status: AC
  Administered 2024-08-06: 40 meq via ORAL
  Filled 2024-08-06: qty 2

## 2024-08-06 NOTE — Discharge Summary (Addendum)
 Physician Discharge Summary   Patient: Pedro Kemp MRN: [id number] DOB: [birth year]  Admit date:     08/04/2024  Discharge date: 08/06/24  Discharge Physician: Elidia Sieving [name]   PCP: Celestia Rosaline SQUIBB, NP   Recommendations at discharge:    Plan to continue losartan , metoprolol  and SGLT 2 inh, no entresto  or spironolactone  due to history of hyperkalemia.  To consider adding digoxin as outpatient.  Continue diuresis with furosemide  40 mg po bid, and increase to 60 mg bid in case of volume overload, weight gain 2 lbs in 24 hrs or 5 lbs in 7 days.  Discontinue aspirin  to decrease risk of bleeding  Follow up renal function and electrolytes as outpatient in 7 days Follow up with Rosaline Celestia P NP in 7 to 10 days Follow up with Cardiology as scheduled.   Discharge Diagnoses: Principal Problem:   Acute on chronic systolic congestive heart failure (HCC) Active Problems:   Essential hypertension   Paroxysmal atrial fibrillation (HCC)   Coronary artery disease   Stage 3a chronic kidney disease (HCC)   Type 2 diabetes mellitus with hyperlipidemia (HCC)  Resolved Problems:   * No resolved hospital problems. [facility] Course: [name] was admitted to the hospital with the working diagnosis of heart failure exacerbation   68 yo male with the past medical history of hypertension, ckd, heart failure, paroxysmal atrial fibrillation and coronary artery disease, who presented with dyspnea. Reported a rapid and progressive dyspnea, started with a severe episode of PND, because persistent symptoms he came to the ED for further evaluation.  On her initial physical examination his blood pressure was 115/64, HR 58, RR 31 and 02 saturation 97%, lungs with bilateral rhonchi with no wheezing, heart with S1 and S2 present and regular with no gallops or rubs, abdomen with no distention and no lower extremity edema.   Na 137, K 4.3 Cl 103 bicarbonate 22, glucose 180 bun 46 and cr 1,89   BNP 945  High sensitive troponin 108 and 97  Wbc 12.2 hgb 13,9 plt 137   Chest radiograph with bilateral hilar vascular congestion, cephalization of the vasculature, with bilateral central interstitial infiltrates, bilateral small bilateral pleural effusions. 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   EKG 61 bpm, normal axis, qtc 430, left bundle branch block, intermittent atrial pacing with ventricular sensing, bilateral atrial enlargement, J point elevation V1 to V4, no significant ST segment or T wave changes.   Patient was placed on IV furosemide  with improvement in volume status.  Limited echocardiogram with stable reduced LV systolic function.  10/19 plan for discharge home and follow up as outpatient.   Assessment and Plan: * Acute on chronic systolic congestive heart failure (HCC) Echocardiogram with reduced LV systolic function with EF 25 to 30%, mild LV cavity dilatation, RV systolic function low normal, LA with severe dilatation, mild mitral valve regurgitation.  Akinetic LV anterior, anteroseptal and anterloateral walls.   Follow up limited echocardiogram with stable reduced LV systolic function 25 to 30%, global hypokinesis, grade II diastolic dysfunction, RV systolic function low normal.   Patient was placed on IV furosemide  for diuresis, negative fluid balance was achieved, - 5,320 ml, with significant improvement in his symptoms.   Plan to continue metoprolol  succinate, SGLT 2 inh and losartan  Had hyperkalemia with spironolactone  and entresto .  Resume furosemide  40 mg po bid, and increase to 60 mg bid in case of volume overload.  Patient may benefit from starting digoxin.  Essential hypertension Continue blood pressure control with losartan  and metoprolol .   Paroxysmal atrial fibrillation (HCC) Continue rate control with metoprolol  and anticoagulation with rivaroxaban ,   Coronary artery disease No chest pain, no acute  coronary syndrome  Discontinue aspirin   High sensitive troponin elevation due to heart failure exacerbation.   Stage 3a chronic kidney disease (HCC) AKI   Volume status has improved, at the time of discharge his renal function has a serum cr of 1,37 with K at 3,7 and serum bicarbonate at 26 Na 138 and Mg 2,5  Plan to continue diuresis with furosemide  and follow up renal function and electrolytes as outpatient.   Type 2 diabetes mellitus with hyperlipidemia (HCC) Patient was placed on insulin  sliding scale for glucose cover and monitoring Glucose remained stable.  Fasting glucose 106 mg/dl  Continue statin       Consultants: none  Procedures performed: none   Disposition: Home Diet recommendation:  Cardiac diet DISCHARGE MEDICATION: Allergies as of 08/06/2024   No Known Allergies      Medication List     STOP taking these medications    aspirin  EC 81 MG tablet       TAKE these medications    atorvastatin  80 MG tablet Commonly known as: LIPITOR  Tome 1 tableta (80 mg en total) por va oral diariamente. (Take 1 tablet (80 mg total) by mouth daily.)   dapagliflozin  propanediol 10 MG Tabs tablet Commonly known as: Farxiga  Tome 1 tableta (10 mg en total) por va oral diariamente antes de desayunar. (Take 1 tablet (10 mg total) by mouth daily before breakfast.)   furosemide  40 MG tablet Commonly known as: LASIX  Take 1 tablet (40 mg total) by mouth 2 (two) times daily. Tomar una tableta (40 mg) dos veces al dia. En caso de aumento the [place] 2 libras en un dia, o 5 libras en [employer], tomar ona tableta y media, dos veces al dia, hasta que el peso vuelva a la normalidad. What changed: additional instructions   losartan  25 MG tablet Commonly known as: Cozaar  Tome 1 tableta (25 mg en total) por va oral diariamente. (Take 1 tablet (25 mg total) by mouth daily.)   metoprolol  succinate 25 MG 24 hr tablet Commonly known as: TOPROL -XL Tome 1 tableta (25 mg en total)  por va oral diariamente. (Take 1 tablet (25 mg total) by mouth daily.)   polyethylene glycol powder 17 GM/SCOOP powder Commonly known as: GLYCOLAX /MIRALAX  Take 17 g by mouth daily.   rivaroxaban  20 MG Tabs tablet Commonly known as: XARELTO  Tome 1 tableta (20 mg en total) por va oral diariamente con alimentos. (Take 1 tablet (20 mg total) by mouth daily wtih food.)        Discharge Exam: Filed Weights   08/04/24 1653 08/05/24 0524 08/06/24 0500  Weight: 65.5 kg 62.4 kg 62 kg   BP 129/77 (BP Location: Left Arm)   Pulse 60   Temp 97.6 F (36.4 C) (Oral)   Resp (!) 22   Ht 5' 4.17 (1.63 m)   Wt 62 kg   SpO2 94%   BMI 23.34 kg/m   Patient is feeling better, has no dyspnea, edema, PND or orthopnea, no chest pain   Neurology awake and alert ENT with no pallor Cardiovascular with S1 and S2 present and regular with no gallops, rubs or murmurs No JVD Respiratory with no rales or wheezing, no rhonchi  Abdomen with no distention  No lower extremity edema   Condition at discharge: stable  The results of significant diagnostics from this hospitalization (including imaging, microbiology, ancillary and laboratory) are listed below for reference.   Imaging Studies: ECHOCARDIOGRAM LIMITED Result Date: 08/06/2024    ECHOCARDIOGRAM LIMITED REPORT   Patient Name:   Nevaeh Casillas Date of Exam: 08/06/2024 Medical Rec #:  [id number]         Height:       64.2 in Accession #:    [id number]        Weight:       136.7 lb Date of Birth:  Jan 01, 1956         BSA:          1.667 m Patient Age:    68 years          BP:           113/71 mmHg Patient Gender: M                 HR:           60 bpm. Exam Location:  Inpatient Procedure: Limited Echo, Color Doppler, Limited Color Doppler and Intracardiac            Opacification Agent (Both Spectral and Color Flow Doppler were            utilized during procedure). Indications:    Dyspnea  History:        Patient has prior history of Echocardiogram  examinations. CHF;                 Pacemaker.  Sonographer:    Norleen Amour Referring Phys: Virgie Chery DANIEL Anjelo Pullman IMPRESSIONS  1. Left ventricular ejection fraction, by estimation, is 25 to 30%. The left ventricle has severely decreased function. The left ventricle demonstrates global hypokinesis. The left ventricular internal cavity size was mildly dilated. Left ventricular diastolic parameters are consistent with Grade II diastolic dysfunction (pseudonormalization). Elevated left atrial pressure.  2. Right ventricular systolic function is low normal. The right ventricular size is normal.  3. The inferior vena cava is normal in size with greater than 50% respiratory variability, suggesting right atrial pressure of 3 mmHg.  4. Limited echo evaluate LV function FINDINGS  Left Ventricle: Left ventricular ejection fraction, by estimation, is 25 to 30%. The left ventricle has severely decreased function. The left ventricle demonstrates global hypokinesis. The left ventricular internal cavity size was mildly dilated. There is no left ventricular hypertrophy. Left ventricular diastolic parameters are consistent with Grade II diastolic dysfunction (pseudonormalization). Elevated left atrial pressure. Right Ventricle: The right ventricular size is normal. Right ventricular systolic function is low normal. Venous: The inferior vena cava is normal in size with greater than 50% respiratory variability, suggesting right atrial pressure of 3 mmHg. Additional Comments: A device lead is visualized in the right atrium and right ventricle.  LEFT VENTRICLE PLAX 2D LVIDd:         6.00 cm      Diastology LVIDs:         5.00 cm      LV e' medial:    4.90 cm/s LV PW:         0.60 cm      LV E/e' medial:  20.0 LV IVS:        0.70 cm      LV e' lateral:   8.92 cm/s LVOT diam:     1.90 cm      LV E/e' lateral: 11.0 LV SV:         41  LV SV Index:   25 LVOT Area:     2.84 cm  LV Volumes (MOD) LV vol d, MOD A2C: 223.0 ml LV vol d, MOD A4C:  202.0 ml LV vol s, MOD A2C: 161.0 ml LV vol s, MOD A4C: 149.0 ml LV SV MOD A2C:     62.0 ml LV SV MOD A4C:     202.0 ml LV SV MOD BP:      66.4 ml RIGHT VENTRICLE            IVC RV Basal diam:  3.80 cm    IVC diam: 1.30 cm RV S prime:     9.25 cm/s TAPSE (M-mode): 2.1 cm LEFT ATRIUM             Index        RIGHT ATRIUM           Index LA diam:        4.30 cm 2.58 cm/m   RA Area:     15.20 cm LA Vol (A2C):   91.4 ml 54.82 ml/m  RA Volume:   38.80 ml  23.27 ml/m LA Vol (A4C):   88.8 ml 53.27 ml/m LA Biplane Vol: 90.0 ml 53.99 ml/m  AORTIC VALVE             PULMONIC VALVE LVOT Vmax:   65.90 cm/s  PV Vmax:       0.74 m/s LVOT Vmean:  47.350 cm/s PV Peak grad:  2.2 mmHg LVOT VTI:    0.145 m  AORTA Ao Root diam: 2.70 cm Ao Asc diam:  2.40 cm MITRAL VALVE MV Area (PHT): 3.19 cm    SHUNTS MV Decel Time: 238 msec    Systemic VTI:  0.14 m MV E velocity: 98.00 cm/s  Systemic Diam: 1.90 cm MV A velocity: 39.80 cm/s MV E/A ratio:  2.46 Dorn Ross MD Electronically signed by Dorn Ross MD Signature Date/Time: 08/06/2024/11:52:17 AM    Final    DG Chest 2 View Result Date: 08/04/2024 CLINICAL DATA:  Chest pain EXAM: CHEST - 2 VIEW COMPARISON:  06/29/2024 FINDINGS: The cardiopericardial silhouette is within normal limits for size. Vascular congestion diffuse interstitial opacity suggest underlying edema. Small left pleural effusion. Left-sided permanent pacemaker/AICD remains in place. No acute bony abnormality. IMPRESSION: 1. Vascular congestion with diffuse interstitial opacity suggesting edema. 2. Small left pleural effusion. Electronically Signed   By: Camellia Candle M.D.   On: 08/04/2024 04:59    Microbiology: Results for orders placed or performed during the hospital encounter of 04/13/24  MRSA Next Gen by PCR, Nasal     Status: None   Collection Time: 04/13/24  6:03 PM   Specimen: Nasal Mucosa; Nasal Swab  Result Value Ref Range Status   MRSA by PCR Next Gen NOT DETECTED NOT DETECTED Final     Comment: (NOTE) The GeneXpert MRSA Assay (FDA approved for NASAL specimens only), is one component of a comprehensive MRSA colonization surveillance program. It is not intended to diagnose MRSA infection nor to guide or monitor treatment for MRSA infections. Test performance is not FDA approved in patients less than 41 years old. Performed at [facility], [address], [place], [place] [address]     Labs: CBC: Recent Labs  Lab 08/04/24 0401  WBC 12.2*  HGB 13.9  HCT 43.2  MCV 93.3  PLT 137*   Basic Metabolic Panel: Recent Labs  Lab 08/04/24 0401 08/05/24 1840 08/06/24 0214  NA 137 137 138  K 4.3 3.9 3.7  CL 103 101 100  CO2 22 24 26   GLUCOSE 180* 221* 106*  BUN 46* 40* 37*  CREATININE 1.89* 1.42* 1.37*  CALCIUM  9.0 8.9 8.9  MG  --   --  2.5*   Liver Function Tests: No results for input(s): AST, ALT, ALKPHOS, BILITOT, PROT, ALBUMIN in the last 168 hours. CBG: No results for input(s): GLUCAP in the last 168 hours.  Discharge time spent: greater than 30 minutes.  Signed: Elidia Toribio Furnace, MD [facility] 08/06/2024

## 2024-08-06 NOTE — Plan of Care (Signed)
  Problem: Clinical Measurements: Goal: Will remain free from infection Outcome: Progressing Goal: Respiratory complications will improve Outcome: Progressing Goal: Cardiovascular complication will be avoided Outcome: Progressing   Problem: Elimination: Goal: Will not experience complications related to urinary retention Outcome: Progressing   Problem: Safety: Goal: Ability to remain free from injury will improve Outcome: Progressing   

## 2024-08-06 NOTE — Progress Notes (Signed)
 Discharge Nurse Summary: DC order noted per MD. DC RN at bedside with patient. Patient agreeable with discharge plan, states family will arrive soon for pickup. AVS printed/reviewed with english/spanish version provided in discharge packet. PIV removed, skin intact. No DME needs. No home meds. TOC meds delivered to the patient. CP/Edu resolved. Telemonitor returned to charging station. All belongings accounted for. Patient wheeled downstairs for discharge by private auto.   Rosario EMERSON Lund, RN

## 2024-08-06 NOTE — Progress Notes (Signed)
 Initial Nutrition Assessment  DOCUMENTATION CODES:  Not applicable  INTERVENTION:  Liberalize fluid restriction to 1.5L to allow for greater food options. May warrant further liberalizations once nutrition hx is able to be obtained.  MVI with minerals daily Attached Spanish CHF and DM handouts to AVS  NUTRITION DIAGNOSIS:  Increased nutrient needs related to chronic illness as evidenced by estimated needs.  GOAL:  Patient will meet greater than or equal to 90% of their needs  MONITOR:  PO intake, Supplement acceptance, I & O's, Labs  REASON FOR ASSESSMENT:  Consult Diet education  ASSESSMENT:  Pt with hx of CHF (EF 25-30%), DM type 2, CKD3, and hx of NSTEMI presented to ED with SOB and dry cough. Found to have CHF exacerbation.  RD working remotely   Consult for diet education. Unable to reach pt on room phone however, do note that pt's weight trending down over the last year which is concerning in the context of his multiple chronic illnesses. Will follow-up as able to obtain physical exam and nutrition hx.   Noted pt with several concerning SDOH results which will greatly impact his ability to afford, obtain, and prepare foods that will contribute to the management of these chronic conditions. These factors should be considered when medical and diet advice and recommendations are being provided.   Admit weight: 65.5 kg Current weight: 62 kg   12.7% weight loss in the last 8 months which is severe for timeframe. However, unsure of dry weight due to probable fluid changes. Pt's weight does appear to be trending down over the last year though. Suspect inadequate intake outpatient is contributing to weight loss.   Average Meal Intake: 10/18: 100% intake x 1 recorded meals  Nutritionally Relevant Medications: Scheduled Meds:  atorvastatin   80 mg Oral Daily   polyethylene glycol powder  17 g Oral Daily   rivaroxaban   15 mg Oral Daily   Labs Reviewed: BUN 37, creatinine  1.37 Magnesium  2.5 HgbA1c 7.5% (3/21)  NUTRITION - FOCUSED PHYSICAL EXAM: Defer to in-person assessment  Diet Order:   Diet Order             Diet heart healthy/carb modified Fluid consistency: Thin; Fluid restriction: 1200 mL Fluid  Diet effective now                   EDUCATION NEEDS:  Education needs have been addressed  Skin:  Skin Assessment: Reviewed RN Assessment  Last BM:  10/17  Height:  Ht Readings from Last 1 Encounters:  08/04/24 5' 4.17 (1.63 m)    Weight:  Wt Readings from Last 1 Encounters:  08/06/24 62 kg    Ideal Body Weight:  59.1 kg  BMI:  Body mass index is 23.34 kg/m.  Estimated Nutritional Needs:  Kcal:  1600-1800 kcal/d Protein:  85-100 g/d Fluid:  1.8L/d    Vernell Lukes, RD, LDN, CNSC Registered Dietitian II Please reach out via secure chat

## 2024-08-07 ENCOUNTER — Telehealth: Payer: Self-pay | Admitting: *Deleted

## 2024-08-07 ENCOUNTER — Encounter: Payer: Self-pay | Admitting: Cardiovascular Disease

## 2024-08-07 NOTE — Transitions of Care (Post Inpatient/ED Visit) (Signed)
   08/07/2024  Name: Pedro Kemp MRN: [id number] DOB: 02/23/1956  Today's TOC FU Call Status: Today's TOC FU Call Status:: Unsuccessful Call (1st Attempt) Unsuccessful Call (1st Attempt) Date: 08/07/24  Attempted to reach the patient regarding the most recent Inpatient/ED visit.  Follow Up Plan: Additional outreach attempts will be made to reach the patient to complete the Transitions of Care (Post Inpatient/ED visit) call.   Andrea Dimes RN, BSN [place]  [facility] RN Care Manager [phone]

## 2024-08-10 ENCOUNTER — Telehealth: Payer: Self-pay

## 2024-08-10 NOTE — Transitions of Care (Post Inpatient/ED Visit) (Signed)
   08/10/2024  Name: Pedro Kemp MRN: [id number] DOB: 09-27-1956  Today's TOC FU Call Status: Today's TOC FU Call Status:: Unsuccessful Call (2nd Attempt) Unsuccessful Call (2nd Attempt) Date: 08/10/24  Attempted to reach the patient regarding the most recent Inpatient/ED visit.  Follow Up Plan: Additional outreach attempts will be made to reach the patient to complete the Transitions of Care (Post Inpatient/ED visit) call.   Alan Ee, RN, BSN, CEN [employer]- Transition of Care Team.  [facility] [phone]

## 2024-08-11 ENCOUNTER — Telehealth: Payer: Self-pay

## 2024-08-11 NOTE — Transitions of Care (Post Inpatient/ED Visit) (Signed)
   08/11/2024  Name: Pedro Kemp MRN: [id number] DOB: July 23, 1956  Today's TOC FU Call Status: Today's TOC FU Call Status:: Unsuccessful Call (3rd Attempt) Unsuccessful Call (3rd Attempt) Date: 08/11/24  Attempted to reach the patient regarding the most recent Inpatient/ED visit.  Follow Up Plan: No further outreach attempts will be made at this time. We have been unable to contact the patient.  Alan Ee, RN, BSN, CEN [employer]- Transition of Care Team.  [facility] [phone]

## 2024-08-18 ENCOUNTER — Ambulatory Visit: Payer: Self-pay

## 2024-08-18 DIAGNOSIS — I472 Ventricular tachycardia, unspecified: Secondary | ICD-10-CM

## 2024-08-19 LAB — CUP PACEART REMOTE DEVICE CHECK
Battery Remaining Longevity: 103 mo
Battery Voltage: 2.99 V
Brady Statistic RV Percent Paced: 0.09 %
Date Time Interrogation Session: 20251030201029
HighPow Impedance: 59 Ohm
Implantable Lead Connection Status: 753985
Implantable Lead Connection Status: 753985
Implantable Lead Implant Date: 20220220
Implantable Lead Implant Date: 20220220
Implantable Lead Location: 753859
Implantable Lead Location: 753860
Implantable Lead Model: 5076
Implantable Pulse Generator Implant Date: 20220220
Lead Channel Impedance Value: 266 Ohm
Lead Channel Impedance Value: 361 Ohm
Lead Channel Impedance Value: 418 Ohm
Lead Channel Pacing Threshold Amplitude: 0.5 V
Lead Channel Pacing Threshold Amplitude: 0.625 V
Lead Channel Pacing Threshold Pulse Width: 0.4 ms
Lead Channel Pacing Threshold Pulse Width: 0.4 ms
Lead Channel Sensing Intrinsic Amplitude: 0.5 mV
Lead Channel Sensing Intrinsic Amplitude: 18.4 mV
Lead Channel Setting Pacing Amplitude: 1.5 V
Lead Channel Setting Pacing Amplitude: 2 V
Lead Channel Setting Pacing Pulse Width: 0.4 ms
Lead Channel Setting Sensing Sensitivity: 0.45 mV
Zone Setting Status: 755011
Zone Setting Status: 755011
Zone Setting Status: 755011

## 2024-08-23 NOTE — Progress Notes (Signed)
 Remote ICD Transmission

## 2024-08-26 ENCOUNTER — Ambulatory Visit: Payer: Self-pay | Admitting: Cardiovascular Disease

## 2024-09-11 ENCOUNTER — Other Ambulatory Visit: Payer: Self-pay

## 2024-09-18 ENCOUNTER — Other Ambulatory Visit: Payer: Self-pay

## 2024-09-19 ENCOUNTER — Observation Stay (HOSPITAL_COMMUNITY)
Admission: EM | Admit: 2024-09-19 | Discharge: 2024-09-25 | DRG: 682 | Disposition: A | Payer: Self-pay | Attending: Internal Medicine | Admitting: Internal Medicine

## 2024-09-19 ENCOUNTER — Encounter (HOSPITAL_COMMUNITY): Payer: Self-pay

## 2024-09-19 ENCOUNTER — Other Ambulatory Visit: Payer: Self-pay

## 2024-09-19 DIAGNOSIS — E43 Unspecified severe protein-calorie malnutrition: Secondary | ICD-10-CM | POA: Insufficient documentation

## 2024-09-19 DIAGNOSIS — N179 Acute kidney failure, unspecified: Principal | ICD-10-CM | POA: Diagnosis present

## 2024-09-19 DIAGNOSIS — I5023 Acute on chronic systolic (congestive) heart failure: Secondary | ICD-10-CM

## 2024-09-19 LAB — URINALYSIS, ROUTINE W REFLEX MICROSCOPIC
Bacteria, UA: NONE SEEN
Bilirubin Urine: NEGATIVE
Glucose, UA: >=500 mg/dL — AB
Ketones, ur: NEGATIVE mg/dL
Leukocytes,Ua: NEGATIVE
Nitrite: NEGATIVE
Protein, ur: 30 mg/dL — AB
Specific Gravity, Urine: 1.012 (ref 1.005–1.030)
pH: 5 (ref 5.0–8.0)

## 2024-09-19 LAB — CBC
HCT: 43 % (ref 39.0–52.0)
Hemoglobin: 13.9 g/dL (ref 13.0–17.0)
MCH: 29 pg (ref 26.0–34.0)
MCHC: 32.3 g/dL (ref 30.0–36.0)
MCV: 89.8 fL (ref 80.0–100.0)
Platelets: 140 K/uL — ABNORMAL LOW (ref 150–400)
RBC: 4.79 MIL/uL (ref 4.22–5.81)
RDW: 13.8 % (ref 11.5–15.5)
WBC: 11 K/uL — ABNORMAL HIGH (ref 4.0–10.5)
nRBC: 0 % (ref 0.0–0.2)

## 2024-09-19 NOTE — ED Notes (Signed)
 Called twice for vitals check... no response.Pedro Kemp

## 2024-09-19 NOTE — ED Triage Notes (Signed)
 Spanish interpreter#[id number] used for triage:   Pt c.o right sided flank pain since this morning around 1 am, no radiation to back or abd. Denies n/v. C.o pain with urination.  Denies hx of kidney stones.

## 2024-09-20 ENCOUNTER — Emergency Department (HOSPITAL_COMMUNITY): Payer: Self-pay

## 2024-09-20 DIAGNOSIS — N179 Acute kidney failure, unspecified: Principal | ICD-10-CM | POA: Diagnosis present

## 2024-09-20 LAB — COMPREHENSIVE METABOLIC PANEL WITH GFR
ALT: 77 U/L — ABNORMAL HIGH (ref 0–44)
ALT: 62 U/L — ABNORMAL HIGH (ref 0–44)
AST: 83 U/L — ABNORMAL HIGH (ref 15–41)
AST: 61 U/L — ABNORMAL HIGH (ref 15–41)
Albumin: 3.9 g/dL (ref 3.5–5.0)
Albumin: 3.4 g/dL — ABNORMAL LOW (ref 3.5–5.0)
Alkaline Phosphatase: 76 U/L (ref 38–126)
Alkaline Phosphatase: 67 U/L (ref 38–126)
Anion gap: 10 (ref 5–15)
Anion gap: 13 (ref 5–15)
BUN: 50 mg/dL — ABNORMAL HIGH (ref 8–23)
BUN: 48 mg/dL — ABNORMAL HIGH (ref 8–23)
CO2: 27 mmol/L (ref 22–32)
CO2: 24 mmol/L (ref 22–32)
Calcium: 9.3 mg/dL (ref 8.9–10.3)
Calcium: 8.5 mg/dL — ABNORMAL LOW (ref 8.9–10.3)
Chloride: 100 mmol/L (ref 98–111)
Chloride: 101 mmol/L (ref 98–111)
Creatinine, Ser: 2.28 mg/dL — ABNORMAL HIGH (ref 0.61–1.24)
Creatinine, Ser: 2.28 mg/dL — ABNORMAL HIGH (ref 0.61–1.24)
GFR, Estimated: 31 mL/min — ABNORMAL LOW (ref >60)
GFR, Estimated: 31 mL/min — ABNORMAL LOW (ref >60)
Glucose, Bld: 182 mg/dL — ABNORMAL HIGH (ref 70–99)
Glucose, Bld: 141 mg/dL — ABNORMAL HIGH (ref 70–99)
Potassium: 4.1 mmol/L (ref 3.5–5.1)
Potassium: 4.6 mmol/L (ref 3.5–5.1)
Sodium: 137 mmol/L (ref 135–145)
Sodium: 138 mmol/L (ref 135–145)
Total Bilirubin: 1 mg/dL (ref 0.0–1.2)
Total Bilirubin: 1.4 mg/dL — ABNORMAL HIGH (ref 0.0–1.2)
Total Protein: 7.2 g/dL (ref 6.5–8.1)
Total Protein: 6.4 g/dL — ABNORMAL LOW (ref 6.5–8.1)

## 2024-09-20 LAB — CBC WITH DIFFERENTIAL/PLATELET
Abs Immature Granulocytes: 0.04 K/uL (ref 0.00–0.07)
Basophils Absolute: 0 K/uL (ref 0.0–0.1)
Basophils Relative: 0 %
Eosinophils Absolute: 0.2 K/uL (ref 0.0–0.5)
Eosinophils Relative: 1 %
HCT: 41.2 % (ref 39.0–52.0)
Hemoglobin: 13.2 g/dL (ref 13.0–17.0)
Immature Granulocytes: 0 %
Lymphocytes Relative: 7 %
Lymphs Abs: 0.9 K/uL (ref 0.7–4.0)
MCH: 28.9 pg (ref 26.0–34.0)
MCHC: 32 g/dL (ref 30.0–36.0)
MCV: 90.2 fL (ref 80.0–100.0)
Monocytes Absolute: 0.8 K/uL (ref 0.1–1.0)
Monocytes Relative: 6 %
Neutro Abs: 11.1 K/uL — ABNORMAL HIGH (ref 1.7–7.7)
Neutrophils Relative %: 86 %
Platelets: 106 K/uL — ABNORMAL LOW (ref 150–400)
RBC: 4.57 MIL/uL (ref 4.22–5.81)
RDW: 14.2 % (ref 11.5–15.5)
WBC: 13.1 K/uL — ABNORMAL HIGH (ref 4.0–10.5)
nRBC: 0 % (ref 0.0–0.2)

## 2024-09-20 LAB — MAGNESIUM: Magnesium: 2.3 mg/dL (ref 1.7–2.4)

## 2024-09-20 LAB — LIPASE, BLOOD: Lipase: 24 U/L (ref 11–51)

## 2024-09-20 LAB — BRAIN NATRIURETIC PEPTIDE: B Natriuretic Peptide: 1284 pg/mL — ABNORMAL HIGH (ref 0.0–100.0)

## 2024-09-20 MED ORDER — FUROSEMIDE 10 MG/ML IJ SOLN
40.0000 mg | Freq: Two times a day (BID) | INTRAMUSCULAR | Status: DC
  Administered 2024-09-20 – 2024-09-21 (×2): 40 mg via INTRAVENOUS
  Filled 2024-09-20 (×2): qty 4

## 2024-09-20 MED ORDER — METOPROLOL SUCCINATE ER 25 MG PO TB24
25.0000 mg | ORAL_TABLET | Freq: Every day | ORAL | Status: DC
  Administered 2024-09-20 – 2024-09-25 (×6): 25 mg via ORAL
  Filled 2024-09-20 (×7): qty 1

## 2024-09-20 MED ORDER — HYDROCODONE-ACETAMINOPHEN 5-325 MG PO TABS
1.0000 | ORAL_TABLET | Freq: Four times a day (QID) | ORAL | Status: DC | PRN
  Administered 2024-09-20: 1 via ORAL
  Filled 2024-09-20: qty 1

## 2024-09-20 MED ORDER — RIVAROXABAN 15 MG PO TABS
15.0000 mg | ORAL_TABLET | Freq: Every day | ORAL | Status: DC
  Administered 2024-09-21 – 2024-09-24 (×4): 15 mg via ORAL
  Filled 2024-09-20 (×4): qty 1

## 2024-09-20 MED ORDER — RIVAROXABAN 20 MG PO TABS
20.0000 mg | ORAL_TABLET | Freq: Every day | ORAL | Status: DC
  Administered 2024-09-20: 20 mg via ORAL
  Filled 2024-09-20: qty 1

## 2024-09-20 MED ORDER — FENTANYL CITRATE (PF) 50 MCG/ML IJ SOSY: 25.0000 ug | PREFILLED_SYRINGE | INTRAMUSCULAR | Status: DC | PRN

## 2024-09-20 MED ORDER — ACETAMINOPHEN 325 MG PO TABS: 650.0000 mg | ORAL_TABLET | Freq: Four times a day (QID) | ORAL | Status: DC | PRN

## 2024-09-20 MED ORDER — DAPAGLIFLOZIN PROPANEDIOL 10 MG PO TABS: 10.0000 mg | ORAL_TABLET | Freq: Every day | ORAL | Status: DC

## 2024-09-20 MED ORDER — LOSARTAN POTASSIUM 50 MG PO TABS: 25.0000 mg | ORAL_TABLET | Freq: Every day | ORAL | Status: DC

## 2024-09-20 MED ORDER — MELATONIN 3 MG PO TABS: 3.0000 mg | ORAL_TABLET | Freq: Every evening | ORAL | Status: DC | PRN

## 2024-09-20 MED ORDER — ONDANSETRON HCL 4 MG/2ML IJ SOLN: 4.0000 mg | Freq: Four times a day (QID) | INTRAMUSCULAR | Status: DC | PRN

## 2024-09-20 MED ORDER — ACETAMINOPHEN 500 MG PO TABS
1000.0000 mg | ORAL_TABLET | Freq: Once | ORAL | Status: AC
  Administered 2024-09-20: 1000 mg via ORAL
  Filled 2024-09-20: qty 2

## 2024-09-20 MED ORDER — SODIUM CHLORIDE 0.9 % IV SOLN: INTRAVENOUS | Status: DC

## 2024-09-20 MED ORDER — ACETAMINOPHEN 650 MG RE SUPP: 650.0000 mg | Freq: Four times a day (QID) | RECTAL | Status: DC | PRN

## 2024-09-20 MED ORDER — ATORVASTATIN CALCIUM 80 MG PO TABS
80.0000 mg | ORAL_TABLET | Freq: Every day | ORAL | Status: DC
  Administered 2024-09-20 – 2024-09-25 (×6): 80 mg via ORAL
  Filled 2024-09-20 (×6): qty 1

## 2024-09-20 MED ORDER — OXYCODONE HCL 5 MG PO TABS
5.0000 mg | ORAL_TABLET | Freq: Once | ORAL | Status: AC
  Administered 2024-09-20: 5 mg via ORAL
  Filled 2024-09-20: qty 1

## 2024-09-20 MED ORDER — ONDANSETRON 4 MG PO TBDP
4.0000 mg | ORAL_TABLET | Freq: Once | ORAL | Status: AC
  Administered 2024-09-20: 4 mg via ORAL
  Filled 2024-09-20: qty 1

## 2024-09-20 NOTE — Discharge Instructions (Addendum)
 [place]  MINISTERIO ROMUALDO DE Edna Direccin: [address] [place], [place] [address] Pedro Kemp telfono:  [phone] Horario de atencin:  Los residentes del condado de [place] pueden venir a buscar alimentos de lunes a viernes de 8:30 a. m. a 3:30 p. m. Se requiere identificacin con fotografa y tarjetas de [employer] para todos los residentes de [place]. Pueden venir seis veces al ao.  LA MESA BENDITA Direccin:  [address] [place], [place] [address] Nmero de telfono:  [phone] Horario de atencin:  funciona de martes a viernes de 10:00 a. m. a 1:00 p. m. Requisitos: se necesita una recomendacin del DSS. Puede venir 6 veces al ao, con [phone] de [address]. Se requiere identificacin con fotografa y nmero de seguro social para todos los residentes del hogar.  Pedro Kemp DE Pedro Kemp Direccin:  [address] [place], [place] [address] Nmero de telfono:  [phone] Horario de atencin:  La despensa de alimentos est abierta el ltimo sbado de cada mes de 10:00 a. m. a 12:00 p. m. No se necesita cita previa. No se requieren requisitos.  Pedro PRESBITERIANA DE [place] Direccin:  [address] [place], [place] [address] Nmero de telfono: [phone] EXT. 21 Horario de atencin:  Es oceanographer para education officer, museum comida los sbados. Las inscripciones para recoger comida los sbados comienzan a las 8:30 a. m. del lunes por la maana.  Pedro CATLICA SAN PABLO APSTOL Direccin:  [address] [place], [place] [address] Nmero de telfono:  [phone] Horario de atencin:  Si necesita alimentos, traiga una identificacin adecuada, como una licencia de conducir, para recibir physiological scientist bolsa de alimentos una vez al mes. Requisitos: Puede venir una vez cada [address] con una derivacin del DSS, el Ejrcito [employer] Salv[employer], salud mental, etc. Cada derivacin es vlida para seis visitas. Se requiere identificacin con fotografa. *No se  requiere derivacin para la primera visita.  Pedro Kemp Direccin:  [address] [place], [place] [address] Nmero de telfono:  [phone]  Pedro Kemp Direccin:  [address] [place], [place] [address] Nmero de telfono:  [phone] Horario de atencin:   Puede registrarse en [email or web address] para obtener alimentos gratis  DESPENSA DE IRVING KELLS DE INDEED Direccin:  [address] [place], [place] [address] Nmero de telfono:  [phone] Horario de atencin:  Thea en el auto, por orden de llegada. Cada tercer sbado de 11 a. m. a 1 p. m.  [address] Direccin:  [address] [place], [place] [address] Nmero de telfono:  [phone]  [place]  DESPENSA DE ALIMENTOS MANO A MANO Direccin:  [address] [place], [place] [address] Nmero de telfono:  [phone] Horario de atencin:  una vez al mes, cada tercer sbado  Pedro DEL RENACIMIENTO Direccin:  [address] [place], [place] [address] Nmero de telfono:  [phone] Horario de atencin:  La distribucin se realiza de 9:00 a 10:00 a. m. todos los sbados.     MANOS AYUDANTES Direccin:  [address] [place], [place] [address] Nmero de telfono:  [phone] Horario de atencin:  UNA VEZ por semana, la distribucin de alimentos a la comunidad se lleva a cabo todos los [place], mircoles y [place] de 11 a. m. a 2 p. m. Los alimentos estn disponibles por orden de llegada y varan de ignacia semana a otra. No es necesario hacer cita para recogerlos en el auto.  Pedro [facility] Direccin:  [address] [place], [place] [address] Nmero de telfono:  [phone] Horario de atencin:  Abierto cada tercer jueves de 9:30 a. m. a 11:00 a. m.  CENTRO DE EXTENSIN DE LA Pedro HOPE Direccin:  [address] [place], [place] [address] Nmero de telfono:  [phone] Horario de atencin:  llame para consultar  horarios, direcciones y hacer preguntas.  Pedro Kemp [place] Direccin: [address] [place], [place] [address] Pedro Kemp telfono: [phone] Sitio web: [email or web address] Aplicacin Buscador de alimentos: https://findfood.ghpfa.org  [employer] Direccin:  [address] [place], [place] [address] Nmero de telfono:  [phone] Horario de atencin:  comunquese con [employer]. Solo clientes registrados por abuso de sustancias  Pedro METODISTA UNIDA DE [facility] Direccin:  [address] [place], [address]  Nmero de telfono: [phone] Horario de atencin:  comunquese con Bartley Irving. La despensa de alimentos abre el tercer sbado de cada mes de 9 a. m. a 12 p. m. nicamente.  CENTRO CRISTIANO DE [place] Direccin: [address] [place], [place] [address] Nmero de telfono: [phone] Horario de atencin:  comunquese con Pedro Kemp. El banco de alimentos de emergencia abre los sbados solo con cita previa.  CENTRO DE RECURSOS FAMILIARES DE [facility]ON[facility] Direccin: [address] [place], [place] [address] Nmero de telfono: [phone] Horario de atencin:  No hay una persona de research scientist (life sciences); cualquiera puede ayudar  [facility]ISTER[facility] Direccin: [address] [place], [place] [address] Nmero de telfono: [phone] Horario de atencin:  comunquese con Pedro Kemp. La agencia entrega una bolsa de comida todos los jueves de 2 a 4 p. m. nicamente, y tambin ofrece una comida comunitaria todos los jueves de 5 a 6 p. m. Otros servicios que brinda incluyen asistencia con el alquiler/hipoteca y los servicios pblicos, refugio de invierno para [place], tienda de segunda mano y [place] para artist.  MINISTERIO DE PUERTAS ABIERTAS DE [place] Direccin: [address] [place], [place] [address] Nmero de telfono: [phone] Horario de atencin:  El Programa de asistencia alimentaria de  emergencia proporciona a las personas y familias un generoso suministro de alimentos que incluye carne, verduras frescas y productos no perecederos. La caja de alimentos contiene alimentos para [address] y cada familia o individuo puede recibir una caja una vez al mes. Lunes, mircoles, jueves y viernes de 11 a. m. a 2 p. m., se aceptan personas sin cita previa.  [facility]OS DE [facility] Direccin: [address] [place], [place] [address]  Nmero de telfono: [phone] Horario de atencin:  Comunquese con Pedro Kemp. Martes y [place] de 11 a. m. a 3 p. m., solo con cita previa.  Transporte para financial planner se ofrece en todo el condado de [place] para asistir a programas para copy, comidas y cuidado diurno para adultos. TAMS brinda alivio a los cuidadores ofreciendo transporte para las personas que asisten a programas de cuidado diurno para adultos. Entendemos lo difcil que puede ser manejar un trabajo y un hogar mientras cuida a un ser querido. Hacemos todo lo posible por cumplir con las necesidades de los cuidadores en cuanto a horarios de recogida y runner, broadcasting/film/video, al mismo tiempo que equilibramos los programas diarios ofrecidos por los centros. El transporte se ofrece entre las 6 a. m. y las 5 p. m., de lunes a viernes. Debe completar un formulario de elegibilidad y ser aprobado antes de recibir estos servicios. Llame al  [phone] para saber si es elegible. No hay tarifa, sin embargo, se aceptan donaciones.

## 2024-09-20 NOTE — ED Notes (Signed)
MD Moore at bedside.

## 2024-09-20 NOTE — Progress Notes (Signed)
  Carryover admission to the Day Admitter.  I discussed this case with the EDP, Dr. Jerral.  Per these discussions:   This is a 68 year old male with history of chronic systolic heart failure in setting of ischemic cardiomyopathy, who is being admitted with acute kidney injury after presenting with 1 day of new onset bilateral flank discomfort, right greater than left.   In October 2025, the patient was hospitalized at [facility] for acutely decompensated heart failure.  His Lasix  was increased at that time to 60 mg p.o. daily, with final creatinine noted to be 1.37 on 08/06/2024.  Today, he denies any shortness of breath, nor any chest pain or subjective fever, chills.   His presenting labs were notable for CMP which showed creatinine 2.28, with potassium 4.1, bicarbonate 27, anion gap 10.  Urinalysis showed no white blood cells, leukocyte esterase/nitrate negative, no bacteria, and demonstrate 30 protein.  CT renal stone study was reported to show evidence of bilateral perinephric stranding, without evidence of ureteral stone.   I have placed an order for observation for further evaluation management of the above.  I have placed some additional preliminary admit orders via the adult multi-morbid admission order set. I have also ordered gentle IV fluids in the form of normal saline at 50 cc/h x 8 hours.  I have added on random urine sodium as well as random urine creatinine in addition to random urine protein to creatinine ratio.  Regarding his bilateral flank discomfort without evidence of ureteral stone, have ordered prn Norco, as well as as needed IV fentanyl for pain not relieved by prn Norco.  Refraining from NSAIDs.  Given his history of chronic systolic heart failure have also added on a BNP and ordered morning labs in the form of CMP, CBC, and magnesium  level.    Eva Pore, DO Hospitalist

## 2024-09-20 NOTE — ED Provider Notes (Signed)
 [place] [facility] Provider Note   CSN: [id number] Arrival date & time: 09/19/24  1728     History Chief Complaint  Patient presents with   Flank Pain    HPI Pedro Kemp is a 68 y.o. male presenting for chief complaint flank pain.  He is a 67 year old male with a substantial medical history.  History of heart failure, VT arrest, ischemic cardiomyopathy.  Presenting with right-sided flank pain and possibly decreased urination though not being tracked at home.  States started about 2 days ago.  Relatively acute in onset.  Very tender to palpation in his right flank.  Intermittent in nature at first but now persistent.  Otherwise denies fevers chills nausea vomiting syncope shortness of breath..   Patient's recorded medical, surgical, social, medication list and allergies were reviewed in the Snapshot window as part of the initial history.   Review of Systems   Review of Systems  Constitutional:  Negative for chills and fever.  HENT:  Negative for ear pain and sore throat.   Eyes:  Negative for pain and visual disturbance.  Respiratory:  Negative for cough and shortness of breath.   Cardiovascular:  Negative for chest pain and palpitations.  Gastrointestinal:  Negative for abdominal pain and vomiting.  Genitourinary:  Positive for flank pain. Negative for dysuria and hematuria.  Musculoskeletal:  Negative for arthralgias and back pain.  Skin:  Negative for color change and rash.  Neurological:  Negative for seizures and syncope.  All other systems reviewed and are negative.   Physical Exam Updated Vital Signs BP 134/65 (BP Location: Left Arm)   Pulse 73   Temp 98.2 F (36.8 C)   Resp 20   Ht 5' 4 (1.626 m)   Wt 59 kg   SpO2 99%   BMI 22.31 kg/m  Physical Exam Vitals and nursing note reviewed.  Constitutional:      General: He is not in acute distress.    Appearance: He is well-developed.  HENT:     Head: Normocephalic and  atraumatic.  Eyes:     Conjunctiva/sclera: Conjunctivae normal.  Cardiovascular:     Rate and Rhythm: Normal rate and regular rhythm.  Pulmonary:     Effort: Pulmonary effort is normal. No respiratory distress.  Abdominal:     General: Abdomen is flat. There is no distension.     Tenderness: There is right CVA tenderness.  Musculoskeletal:        General: No swelling or deformity.  Skin:    General: Skin is warm and dry.     Capillary Refill: Capillary refill takes less than 2 seconds.  Neurological:     Mental Status: He is alert and oriented to person, place, and time. Mental status is at baseline.      ED Course/ Medical Decision Making/ A&P    Procedures Procedures   Medications Ordered in ED Medications  acetaminophen  (TYLENOL ) tablet 1,000 mg (1,000 mg Oral Given 09/20/24 0210)  oxyCODONE  (Oxy IR/ROXICODONE ) immediate release tablet 5 mg (5 mg Oral Given 09/20/24 0210)  ondansetron  (ZOFRAN -ODT) disintegrating tablet 4 mg (4 mg Oral Given 09/20/24 0210)   Medical Decision Making:   Pedro Kemp is a 67 y.o. male who presented to the ED today with right sided flank pain, detailed above.    Patient placed on continuous vitals and telemetry monitoring while in ED which was reviewed periodically.  Complete initial physical exam performed, notably the patient  was Hds in NAD.  Reviewed and confirmed nursing documentation for past medical history, family history, social history.    Initial Assessment:   With the patient's presentation of flank pain, most likely diagnosis is Urologic pathology (including UL vs UTI vs pyelo) vs MSK etiology. Other diagnoses were considered including (but not limited to) gastroenteritis, colitis, small bowel obstruction, appendicitis, cholecystitis, pancreatitis,  . These are considered less likely due to history of present illness and physical exam findings.   This is most consistent with an acute life/limb threatening illness complicated  by underlying chronic conditions.   Initial Plan:  CBC/ Metabolic Panel  to evaluate for underlying infectious/metabolic etiology for patient's abdominal pain  Lipase to evaluate for pancreatitis  EKG to evaluate for cardiac source of pain  CT Ab/pelvis without contrast due to favored urologic over GI etiology for patient's abdominal pain  Urinalysis and repeat physical assessment to evaluate for UTI/Pyelonpehritis  Empiric management of symptoms with escalating pain control and antiemetics as needed.   Initial Study Results:   Laboratory  Worsening AKI over CKD.  EKG EKG was reviewed independently. Rate, rhythm, axis, intervals all examined and without medically relevant abnormality. ST segments without concerns for elevations.    Radiology All images reviewed independently. Agree with radiology report at this time.   CT Renal Stone Study Result Date: 09/20/2024 EXAM: CT UROGRAM 09/20/2024 02:01:23 AM TECHNIQUE: CT of the abdomen and pelvis was performed without the administration of intravenous contrast as per CT urogram protocol. Multiplanar reformatted images as well as MIP urogram images are provided for review. Automated exposure control, iterative reconstruction, and/or weight based adjustment of the mA/kV was utilized to reduce the radiation dose to as low as reasonably achievable. COMPARISON: None available. CLINICAL HISTORY: Abdominal/flank pain, stone suspected. FINDINGS: LOWER CHEST: Pacemaker leads noted within the right ventricle. Global cardiac size within normal limits. Trace bilateral pleural effusions and interstitial edema are noted within the visualized lung bases, possibly reflecting changes of cardiogenic failure. LIVER: The liver is unremarkable. GALLBLADDER AND BILE DUCTS: Status post cholecystectomy. No biliary ductal dilatation. SPLEEN: No acute abnormality. PANCREAS: No acute abnormality. ADRENAL GLANDS: No acute abnormality. KIDNEYS, URETERS AND BLADDER: Moderate  bilateral perinephric stranding, nonspecific. This may be inflammatory in nature, as can be seen with pyelonephritis or glomerulonephritis. No perinephric fluid collections are seen. No hydronephrosis. No intrarenal or ureteral calculi are seen. The bladder is unremarkable. GI AND BOWEL: Stomach demonstrates no acute abnormality. There is no bowel obstruction. PERITONEUM AND RETROPERITONEUM: No ascites. No free air. VASCULATURE: Extensive aortoiliac atherosclerotic calcifications. No aortic aneurysm. LYMPH NODES: No lymphadenopathy. REPRODUCTIVE ORGANS: Tiny bilateral fat-containing inguinal hernias. BONES AND SOFT TISSUES: Osseous structures are age-appropriate. No acute bone abnormality. No lytic or blastic bone lesion. No focal soft tissue abnormality. IMPRESSION: 1. No nephrolithiasis or obstructive uropathy. 2. Moderate bilateral perinephric stranding, nonspecific, possibly inflammatory (e.g., pyelonephritis or glomerulonephritis). Correlation with urinalysis and urine culture as well as renal function tests may be helpful for further management. 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  Clinical picture does not appear to be consistent with infectious etiology as likely source.  Given AKI and findings on CT scan, will arrange for admission for further diagnostic care and management.  Disposition:   Based on the above findings, I believe this patient is stable for admission.    Patient/family educated about specific findings on our evaluation and explained exact reasons  for admission.  Patient/family educated about clinical situation and time was allowed to answer questions.   Admission team communicated with and agreed with need for admission. Patient admitted. Patient ready to move at this time.     Emergency Department Medication Summary:   Medications   acetaminophen  (TYLENOL ) tablet 1,000 mg (1,000 mg Oral Given 09/20/24 0210)  oxyCODONE  (Oxy IR/ROXICODONE ) immediate release tablet 5 mg (5 mg Oral Given 09/20/24 0210)  ondansetron  (ZOFRAN -ODT) disintegrating tablet 4 mg (4 mg Oral Given 09/20/24 0210)             Clinical Impression:  1. AKI (acute kidney injury)      Data Unavailable   Final Clinical Impression(s) / ED Diagnoses Final diagnoses:  AKI (acute kidney injury)    Rx / DC Orders ED Discharge Orders     None         Jerral Meth, MD 09/20/24 0430

## 2024-09-20 NOTE — H&P (Addendum)
 History and Physical    Patient: Pedro Kemp FMW:[id number] DOB: 09-29-1956 DOA: 09/19/2024 DOS: the patient was seen and examined on 09/20/2024 PCP: Celestia Rosaline SQUIBB, NP  Patient coming from: Home  Chief Complaint:  Chief Complaint  Patient presents with   Flank Pain   HPI: Pedro Kemp is a 68 y.o. male with medical history significant of NSTEMI status post PCI to LAD at [facility], chronic heart failure reduced ejection fraction, history of VT arrest status post ICD placement, diabetes type 2, CKD stage II, A-fib, and recent admission from 10/17-19 where pt was treated for acute on chronic heart failure with diuresis and d/c on lasix  60mg  BID *(up from 40mg  BID) who p/w R flank pain x1 day and found to have AKI.  Pt presented with R flank pain that began the previous afternoon and worsened around 0100, which prompted the ED visit. Pt denies any recent or past injury to this area. He did not take any medications for pain prior to arrival, and presented when his pain acutely worsened this morning.  In ED, pt AFVSS. Labs notable for Cr 2.28 (baseline 1.3-1.4 as of 08/05/2024), WBC 13.1, and BNP 1284. EDP initially started IVF given AKI prior to BNP and requested admission for ongoing w/u. IVF have since been discontiued, and pt administered IV lasix  40mg  BID for now.   Review of Systems: As mentioned in the history of present illness. All other systems reviewed and are negative. 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   Past Surgical History:  Procedure Laterality Date   CARDIAC CATHETERIZATION     ICD IMPLANT     RIGHT/LEFT HEART CATH AND CORONARY ANGIOGRAPHY N/A 08/24/2023   Procedure: RIGHT/LEFT HEART CATH AND CORONARY ANGIOGRAPHY;   Surgeon: Wendel Lurena POUR, MD;  Location: [facility] INVASIVE CV LAB;  Service: Cardiovascular;  Laterality: N/A;   Social History:  reports that he has never smoked. He has never been exposed to tobacco smoke. He has never used smokeless tobacco. He reports that he does not drink alcohol and does not use drugs.  No Known Allergies  Family History  Problem Relation Age of Onset   Diabetes Mother    Diabetes Brother     Prior to Admission medications   Medication Sig Start Date End Date Taking? Authorizing Provider  atorvastatin  (LIPITOR ) 80 MG tablet Take 1 tablet (80 mg total) by mouth daily. 05/05/24   Croitoru, Mihai, MD  dapagliflozin  propanediol (FARXIGA ) 10 MG TABS tablet Take 1 tablet (10 mg total) by mouth daily before breakfast. 05/05/24   Croitoru, Mihai, MD  furosemide  (LASIX ) 40 MG tablet Take 1 tablet (40 mg total) by mouth 2 (two) times daily. Tomar una tableta (40 mg) dos veces al dia. En caso de aumento the [place] 2 libras en un dia, o 5 libras en [facility], tomar ona tableta y media, dos veces al dia, hasta que el peso vuelva a la normalidad. 08/06/24   Arrien, Mauricio Daniel, MD  losartan  (COZAAR ) 25 MG tablet Take 1 tablet (25 mg total) by mouth daily. 05/05/24   Croitoru, Mihai, MD  metoprolol  succinate (TOPROL -XL) 25 MG 24 hr tablet Take 1 tablet (25 mg total) by mouth daily. 05/05/24  Croitoru, Mihai, MD  polyethylene glycol powder (GLYCOLAX /MIRALAX ) 17 GM/SCOOP powder Take 17 g by mouth daily. 04/15/24   Regalado, Belkys A, MD  rivaroxaban  (XARELTO ) 20 MG TABS tablet Take 1 tablet (20 mg total) by mouth daily wtih food. 05/05/24   Croitoru, Jerel, MD    Physical Exam: Vitals:   09/20/24 0457 09/20/24 0458 09/20/24 0720 09/20/24 0722  BP:   (!) 105/59   Pulse:   71   Resp:   17   Temp: 98.3 F (36.8 C) 98.3 F (36.8 C)  97.9 F (36.6 C)  TempSrc: Oral   Oral  SpO2:   100%   Weight:      Height:       General: Alert, oriented x3, resting comfortably in no acute  distress Respiratory: Bibasilar rales; no wheezing Cardiovascular: Regular rate and rhythm w/o m/r/g   Data Reviewed:  Lab Results  Component Value Date   WBC 13.1 (H) 09/20/2024   HGB 13.2 09/20/2024   HCT 41.2 09/20/2024   MCV 90.2 09/20/2024   PLT 106 (L) 09/20/2024   Lab Results  Component Value Date   GLUCOSE 141 (H) 09/20/2024   CALCIUM  8.5 (L) 09/20/2024   NA 138 09/20/2024   K 4.6 09/20/2024   CO2 24 09/20/2024   CL 101 09/20/2024   BUN 48 (H) 09/20/2024   CREATININE 2.28 (H) 09/20/2024   Lab Results  Component Value Date   ALT 62 (H) 09/20/2024   AST 61 (H) 09/20/2024   ALKPHOS 67 09/20/2024   BILITOT 1.4 (H) 09/20/2024   Lab Results  Component Value Date   INR 1.0 08/04/2024   INR 1.1 01/07/2024   Radiology: CT Renal Stone Study Result Date: 09/20/2024 EXAM: CT UROGRAM 09/20/2024 02:01:23 AM TECHNIQUE: CT of the abdomen and pelvis was performed without the administration of intravenous contrast as per CT urogram protocol. Multiplanar reformatted images as well as MIP urogram images are provided for review. Automated exposure control, iterative reconstruction, and/or weight based adjustment of the mA/kV was utilized to reduce the radiation dose to as low as reasonably achievable. COMPARISON: None available. CLINICAL HISTORY: Abdominal/flank pain, stone suspected. FINDINGS: LOWER CHEST: Pacemaker leads noted within the right ventricle. Global cardiac size within normal limits. Trace bilateral pleural effusions and interstitial edema are noted within the visualized lung bases, possibly reflecting changes of cardiogenic failure. LIVER: The liver is unremarkable. GALLBLADDER AND BILE DUCTS: Status post cholecystectomy. No biliary ductal dilatation. SPLEEN: No acute abnormality. PANCREAS: No acute abnormality. ADRENAL GLANDS: No acute abnormality. KIDNEYS, URETERS AND BLADDER: Moderate bilateral perinephric stranding, nonspecific. This may be inflammatory in nature, as can  be seen with pyelonephritis or glomerulonephritis. No perinephric fluid collections are seen. No hydronephrosis. No intrarenal or ureteral calculi are seen. The bladder is unremarkable. GI AND BOWEL: Stomach demonstrates no acute abnormality. There is no bowel obstruction. PERITONEUM AND RETROPERITONEUM: No ascites. No free air. VASCULATURE: Extensive aortoiliac atherosclerotic calcifications. No aortic aneurysm. LYMPH NODES: No lymphadenopathy. REPRODUCTIVE ORGANS: Tiny bilateral fat-containing inguinal hernias. BONES AND SOFT TISSUES: Osseous structures are age-appropriate. No acute bone abnormality. No lytic or blastic bone lesion. No focal soft tissue abnormality. IMPRESSION: 1. No nephrolithiasis or obstructive uropathy. 2. Moderate bilateral perinephric stranding, nonspecific, possibly inflammatory (e.g., pyelonephritis or glomerulonephritis). Correlation with urinalysis and urine culture as well as renal function tests may be helpful for further management. Electronically signed by: Dorethia Molt MD 09/20/2024 02:30 AM EST RP Workstation: [id number]    Assessment and Plan: 44M h/o NSTEMI w/p  PCI to LAD at [facility], [place] arrest s/p AICD, Afib, chronic HFrEF (EF 25-30% in 07/2024),DM2, CKD2, diabetes type 2, CKD stage II, A-fib, and recent admission from 10/17-19 where pt was treated for acute on chronic heart failure with diuresis and d/c on lasix  60mg  BID *(up from 40mg  BID) who p/w R flank pain x1 day and found to have AKI.  AKI Presumed hypervolemic iso elevated BNP -Diuresis per below -Strict I&Os and daily weights (standing preferred) -F/u BMP daily -Renally dose medications for CrCl -Avoid lovenox , NSAIDs, morphine , Fleet's phosphate enema, regular insulin , contrast; no gadolinium for MRI to avoid nephrogenic systemic fibrosis -Consider renal US  and nephrology consult if worsening AKI  Acute on chronic systolic heart failure Elevated BNP -RD consulted for low sodium diet teaching -IV lasix   40mg  BID for now; goal net neg 1-2L/d; strict I/Os; daily standing weights; K>4/Mg>2 -PTA atorvastatin , and metoprolol  -HOLD pta losartan  and Farxiga  given AKI; will need to resume at d/c if able -Will need close OP Cards f/u at d/c  Afib -PTA Xarelto  20mg  daily and metoprolol  XL 25mg  daily   Advance Care Planning:   Code Status: Full Code   Consults: N/A  Family Communication: Sister  Severity of Illness: The appropriate patient status for this patient is INPATIENT. Inpatient status is judged to be reasonable and necessary in order to provide the required intensity of service to ensure the patient's safety. The patient's presenting symptoms, physical exam findings, and initial radiographic and laboratory data in the context of their chronic comorbidities is felt to place them at high risk for further clinical deterioration. Furthermore, it is not anticipated that the patient will be medically stable for discharge from the hospital within 2 midnights of admission.   * I certify that at the point of admission it is my clinical judgment that the patient will require inpatient hospital care spanning beyond 2 midnights from the point of admission due to high intensity of service, high risk for further deterioration and high frequency of surveillance required.*   ------- I spent 65 minutes reviewing previous notes, at the bedside counseling/discussing the treatment plan, and performing clinical documentation.  Author: Marsha Ada, MD 09/20/2024 7:28 AM  For on call review [email or web address].

## 2024-09-21 DIAGNOSIS — E43 Unspecified severe protein-calorie malnutrition: Secondary | ICD-10-CM | POA: Insufficient documentation

## 2024-09-21 DIAGNOSIS — N179 Acute kidney failure, unspecified: Secondary | ICD-10-CM

## 2024-09-21 LAB — PROCALCITONIN: Procalcitonin: 0.21 ng/mL

## 2024-09-21 MED ORDER — ENSURE PLUS HIGH PROTEIN PO LIQD
237.0000 mL | Freq: Two times a day (BID) | ORAL | Status: DC
  Administered 2024-09-21 – 2024-09-25 (×7): 237 mL via ORAL

## 2024-09-21 MED ORDER — SODIUM CHLORIDE 0.9 % IV SOLN
2.0000 g | INTRAVENOUS | Status: DC
  Administered 2024-09-21 – 2024-09-25 (×5): 2 g via INTRAVENOUS
  Filled 2024-09-21 (×5): qty 20

## 2024-09-21 MED ORDER — ADULT MULTIVITAMIN W/MINERALS CH
1.0000 | ORAL_TABLET | Freq: Every day | ORAL | Status: DC
  Administered 2024-09-21 – 2024-09-25 (×5): 1 via ORAL
  Filled 2024-09-21 (×5): qty 1

## 2024-09-21 NOTE — Progress Notes (Signed)
 Initial Nutrition Assessment  DOCUMENTATION CODES:  Severe malnutrition in context of social or environmental circumstances  INTERVENTION:  Continue regular diet as ordered Ensure Plus High Protein po BID, each supplement provides 350 kcal and 20 grams of protein. MVI with minerals daily Community food and transportation resources provided via TOC in AVS  NUTRITION DIAGNOSIS:  Severe Malnutrition related to social / environmental circumstances as evidenced by severe fat depletion, severe muscle depletion.  GOAL:  Patient will meet greater than or equal to 90% of their needs  MONITOR:  PO intake, Supplement acceptance, Labs, Weight trends  REASON FOR ASSESSMENT:  Consult Diet education  ASSESSMENT:  Pt admitted with c/o lateral abdominal and flake pain x1 day prior to admission. PMH significant for CAD, ischemic cardimyopathy, chronic systolic CHF with EF 25-30%, VT arrest, ICD, afib, T2DM, recently admitted in October with CHF.  Spoke with patient at bedside utilizing interpreter services via iPad [place] [phone]   He is a limited historian.  He states that he has had a poor appetite for about 3 days PTA d/t flank pain. Prior to that he reports that he eats eggs for breakfast and makes a vegetable soup for lunch and dinner.  He reports poor access to food and transportation. He has nephews that assist with this but they work therefore limiting his ability to get around.   Meal completions: 12/4: 50% breakfast  Pt reports that his weight is now about 130 lbs. He endorses having a lot of weight loss but cannot recall how much or in what time frame.   Reviewed weight history, on 3/19, pt's weight documented to be 70.8 kg and current weight 61.9 kg. This reflects a weight loss of 12.6% which is concerning but not clinically significant for time frame. Also of note, pt discharged in October on lasix  d/t CHF.   UOP: x12 hours + x6 hours I/O's: -1.4L since  admission  Medications: IV abx  Labs:  last updated 12/3 BUN 48 Cr 2.28 GFR 31  NUTRITION - FOCUSED PHYSICAL EXAM: Flowsheet Row Most Recent Value  Orbital Region Severe depletion  Upper Arm Region Severe depletion  Thoracic and Lumbar Region Moderate depletion  Buccal Region Severe depletion  Temple Region Severe depletion  Clavicle Bone Region Mild depletion  Clavicle and Acromion Bone Region Moderate depletion  Scapular Bone Region Unable to assess  Dorsal Hand Moderate depletion  Patellar Region Severe depletion  Anterior Thigh Region Severe depletion  Posterior Calf Region Moderate depletion  Edema (RD Assessment) None  Hair Reviewed  Eyes Reviewed  Mouth Reviewed  Skin Reviewed  Nails Reviewed   Diet Order:   Diet Order             Diet regular Room service appropriate? Yes; Fluid consistency: Thin  Diet effective now                   EDUCATION NEEDS:   Education needs have been addressed  Skin:  Skin Assessment: Reviewed RN Assessment  Last BM:  12/2  Height:   Ht Readings from Last 1 Encounters:  09/19/24 5' 4 (1.626 m)    Weight:   Wt Readings from Last 1 Encounters:  09/21/24 61.9 kg   BMI:  Body mass index is 23.41 kg/m.  Estimated Nutritional Needs:   Kcal:  1700-1900  Protein:  85-100g  Fluid:  1.7-1.9L  Royce Maris, RDN, LDN Clinical Nutrition See AMiON for contact information.

## 2024-09-21 NOTE — Progress Notes (Signed)
   Heart Failure Stewardship Pharmacist Progress Note   PCP: Celestia Rosaline SQUIBB, NP PCP-Cardiologist: Jerel Balding, MD    HPI:  68 yo M with PMH of CHF, VT arrest, CAD, ICM, T2DM, CKD II, and afib.  Admitted 10/17-10/19 with acute on chronic CHF. Discharged on increased lasix  dose 60 mg BID.   Presented to the ED on 12/2 with right sided flank pain. Found to have AKI. BNP elevated.   Met with patient at bedside. Utilized Estate Manager/land Agent [phone]. Denies shortness of breath, chest pain, palpitations, lightheadedness, or dizziness. No LE edema. States that he hasn't had issues with shortness of breath or swelling in about a month. Does not use a pill box at home. Discussed with patient about changes to GDMT given AKI and holding therapies to allow for renal recovery.   Current HF Medications: Beta Blocker: metoprolol  XL 25 mg daily  Prior to admission HF Medications: Diuretic: furosemide  40 mg BID - patient confirms taking twice daily, not PRN Beta blocker: metoprolol  XL 25 mg daily ACE/ARB/ARNI: losartan  25 mg daily SGLT2i: Farxiga  10 mg daily  Pertinent Lab Values: Serum creatinine 2.28, BUN 48, Potassium 4.6, Sodium 138, BNP 1284, Magnesium  2.3   Vital Signs: Weight: 136 lbs (admission weight: 130 lbs) Blood pressure: 120/60s  Heart rate: 70s  I/O: net -0.8L since admission  Medication Assistance / Insurance Benefits Check: Does the patient have prescription insurance?  No  Does the patient qualify for medication assistance through manufacturers or grants?   Yes Eligible grants and/or patient assistance programs: Xarelto , Farxiga  Medication assistance applications in progress: non  Medication assistance applications approved: Xarelto , Farxiga  Approved medication assistance renewals will be completed by: [facility]  Outpatient Pharmacy:  Prior to admission outpatient pharmacy:  [facility] Is the patient willing to use [facility] TOC pharmacy at discharge? Yes     Assessment: 1. Chronic systolic CHF (LVEF 25-30%), due to ICM. NYHA class II symptoms. - Was receiving IV lasix  BID - last dose given this AM. Volume status looks ok. Strict I/Os and daily weights. Keep K>4 and Mg>2. - Continue metoprolol  XL 25 mg daily - Holding losartan  and Farxiga  with AKI   Plan: 1) Medication changes recommended at this time: - None, awaiting resolution of AKI  2) Patient assistance: - No insurance - has been approved for Farxiga  and Xarelto  patient assistance already  3)  Education  - Patient has been educated on current HF medications and potential additions to HF medication regimen - Patient verbalizes understanding that over the next few months, these medication doses may change and more medications may be added to optimize HF regimen - Patient has been educated on basic disease state pathophysiology and goals of therapy   Duwaine Plant, PharmD, BCPS Heart Failure Stewardship Pharmacist Phone [phone]

## 2024-09-21 NOTE — Progress Notes (Signed)
 CSW addressed SDOH needs and placed resources on patients AVS in spanish.   Kathyjo Briere, MSW, LCSWA Transitions of Care [phone]

## 2024-09-21 NOTE — Progress Notes (Incomplete)
 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  Tobacco Use   Smoking status: Never    Passive exposure: Never   Smokeless tobacco: Never  Vaping Use   Vaping status: Never Used  Substance and Sexual Activity   Alcohol use: Never   Drug use: Never   Sexual activity: Not Currently    Partners: Female  Other Topics Concern   Not on file  Social History Narrative   Not on file   Social Drivers of Health   Financial Resource Strain: High Risk (08/25/2023)   Overall Financial Resource Strain (CARDIA)    Difficulty of Paying Living Expenses: Hard  Food Insecurity: Food Insecurity Present (09/20/2024)   Hunger Vital Sign    Worried About Running Out of Food in the Last Year: Sometimes true    Ran Out of Food in the Last Year: Sometimes true  Transportation Needs: Unmet Transportation Needs (09/20/2024)   PRAPARE - Administrator, Civil Service (Medical): Yes    Lack of Transportation (Non-Medical): Yes  Physical Activity: Not on file  Stress: Not on file  Social Connections: Moderately Isolated (09/20/2024)   Social Connection and Isolation Panel    Frequency of Communication with Friends and Family: More than three times a week    Frequency of Social Gatherings  with Friends and Family: More than three times a week    Attends Religious Services: 1 to 4 times per year    Active Member of [employer] or Organizations: No    Attends Banker Meetings: Never    Marital Status: Widowed   Education Assessment and Provision:  Detailed education and instructions provided on heart failure disease management including the following:  Signs and symptoms of Heart Failure When to call the physician Importance of daily weights Low sodium diet Fluid restriction Medication management Anticipated future follow-up appointments  Patient education given on each of the above topics.  Patient acknowledges understanding via teach back method and acceptance of all instructions.  Education Materials:  Living Better With Heart Failure Booklet, HF zone tool, & Daily Weight Tracker Tool.  Patient has scale at home: *** Patient has pill box at home: ***    High Risk Criteria for Readmission and/or Poor Patient Outcomes: Heart failure hospital admissions (last 6 months): ***  No Show rate: *** Difficult social situation: *** Demonstrates medication adherence: *** Primary Language: *** Literacy level: ***  Barriers of Care:   ***  Considerations/Referrals:   Referral made to Heart Failure Pharmacist Stewardship: *** Referral made to Heart Failure CSW/NCM TOC: *** Referral made to Heart & Vascular TOC clinic: ***  Items for Follow-up on DC/TOC: ***   ***

## 2024-09-21 NOTE — Consult Note (Signed)
 [facility] Renal Consultation Note  Requesting MD: Dr. Sigurd Pac  Indication for Consultation:  AKI and CT with moderate bilateral perinephric stranding.   Chief complaint: Abdominal/flank pain   HPI:  Pedro Kemp is a 68 y.o. male with PMH of CAD, Ischemic cardiomyopathy, HFrEF, h/o VT arrest with ICD, PAF on eliquis , T2DM, who presented to the ED with abdominal pain and flank pain for a few days. He did not have any urinary symptoms, fevers, or chills. In the ED noted to have an AKI with Cr of 2.2 from baseline of about 1.3. BNP elevated at 1284.  CT renal study was notable for Moderate bilateral perinephric stranding, nonspecific. This may be inflammatory in nature, as can be seen with pyelonephritis or glomerulonephritis. No perinephric fluid collections are seen. No hydronephrosis. No intrarenal or ureteral calculi are seen.  UA with small hgb (0-5 RBC), protein 30.   Patient is s/p cholecystectomy.  Creatinine, Ser  Date/Time Value Ref Range Status  09/20/2024 07:36 AM 2.28 (H) 0.61 - 1.24 mg/dL Final  87/96/7974 87:71 AM 2.28 (H) 0.61 - 1.24 mg/dL Final  89/80/7974 97:85 AM 1.37 (H) 0.61 - 1.24 mg/dL Final  89/81/7974 93:59 PM 1.42 (H) 0.61 - 1.24 mg/dL Final  89/82/7974 95:98 AM 1.89 (H) 0.61 - 1.24 mg/dL Final  90/88/7974 95:79 AM 1.55 (H) 0.61 - 1.24 mg/dL Final  93/71/7974 97:72 AM 1.58 (H) 0.61 - 1.24 mg/dL Final  93/72/7974 90:87 AM 1.37 (H) 0.61 - 1.24 mg/dL Final  93/73/7974 88:48 PM 1.37 (H) 0.61 - 1.24 mg/dL Final  93/73/7974 89:72 AM 1.33 (H) 0.61 - 1.24 mg/dL Final  93/73/7974 97:77 AM 1.59 (H) 0.61 - 1.24 mg/dL Final  96/77/7974 97:51 AM 1.25 (H) 0.61 - 1.24 mg/dL Final  96/78/7974 98:48 AM 1.30 (H) 0.61 - 1.24 mg/dL Final  87/97/7975 96:53 PM 1.29 (H) 0.61 - 1.24 mg/dL Final  88/81/7975 90:76 AM 1.50 (H) 0.61 - 1.24 mg/dL Final  88/92/7975 90:69 AM 1.31 (H) 0.61 - 1.24 mg/dL Final  88/93/7975 90:70 AM 1.23 0.61 - 1.24 mg/dL Final   88/94/7975 96:83 AM 1.40 (H) 0.61 - 1.24 mg/dL Final  88/94/7975 96:89 AM 1.48 (H) 0.61 - 1.24 mg/dL Final  95/74/7975 95:60 AM 1.27 (H) 0.61 - 1.24 mg/dL Final  96/74/7975 91:85 AM 1.34 (H) 0.76 - 1.27 mg/dL Final  97/97/7975 88:83 AM 1.25 (H) 0.61 - 1.24 mg/dL Final  98/75/7975 96:62 PM 1.66 (H) 0.76 - 1.27 mg/dL Final  98/77/7975 88:78 AM 1.43 (H) 0.76 - 1.27 mg/dL Final  98/80/7975 94:61 PM 1.64 (H) 0.61 - 1.24 mg/dL Final  98/80/7975 89:53 AM 1.30 (H) 0.61 - 1.24 mg/dL Final  98/91/7975 93:61 AM 1.33 (H) 0.61 - 1.24 mg/dL Final  98/92/7975 98:72 AM 1.32 (H) 0.61 - 1.24 mg/dL Final  98/94/7975 89:80 PM 1.25 (H) 0.61 - 1.24 mg/dL Final  90/94/7976 90:93 AM 1.39 (H) 0.61 - 1.24 mg/dL Final  91/74/7976 96:49 PM 1.32 (H) 0.76 - 1.27 mg/dL Final  95/75/7976 96:83 PM 1.78 (H) 0.76 - 1.27 mg/dL Final  95/84/7976 96:67 AM 1.23 0.61 - 1.24 mg/dL Final  95/85/7976 97:54 AM 1.05 0.61 - 1.24 mg/dL Final  95/86/7976 92:65 AM 1.26 (H) 0.61 - 1.24 mg/dL Final  96/77/7976 87:96 PM 1.38 (H) 0.76 - 1.27 mg/dL Final  89/95/7977 97:45 PM 1.54 (H) 0.76 - 1.27 mg/dL Final     PMHx:   Past Medical History:  Diagnosis Date   Cardiac arrest (HCC)    CHF (congestive heart failure) (HCC)  Coronary artery disease    Diabetes mellitus without complication (HCC)    H/O heart artery stent    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    Past Surgical History:  Procedure Laterality Date   CARDIAC CATHETERIZATION     ICD IMPLANT     RIGHT/LEFT HEART CATH AND CORONARY ANGIOGRAPHY N/A 08/24/2023   Procedure: RIGHT/LEFT HEART CATH AND CORONARY ANGIOGRAPHY;  Surgeon: Wendel Lurena POUR, MD;  Location: [facility];  Service: Cardiovascular;  Laterality: N/A;    Family Hx:  Family History  Problem Relation Age of Onset   Diabetes Mother    Diabetes Brother     Social History:  reports that he has never smoked. He has never been exposed to tobacco smoke. He has never  used smokeless tobacco. He reports that he does not drink alcohol and does not use drugs.  Allergies: No Known Allergies  Medications: Prior to Admission medications   Medication Sig Start Date End Date Taking? Authorizing Provider  atorvastatin  (LIPITOR ) 80 MG tablet Take 1 tablet (80 mg total) by mouth daily. 05/05/24  Yes Croitoru, Mihai, MD  dapagliflozin  propanediol (FARXIGA ) 10 MG TABS tablet Take 1 tablet (10 mg total) by mouth daily before breakfast. 05/05/24  Yes Croitoru, Mihai, MD  furosemide  (LASIX ) 40 MG tablet Take 1 tablet (40 mg total) by mouth 2 (two) times daily. Tomar una tableta (40 mg) dos veces al dia. En caso de aumento the [place] 2 libras en un dia, o 5 libras en [place], tomar ona tableta y media, dos veces al dia, hasta que el peso vuelva a la normalidad. Patient taking differently: Take 40 mg by mouth 2 (two) times daily as needed for fluid. 08/06/24  Yes Arrien, Elidia Sieving, MD  losartan  (COZAAR ) 25 MG tablet Take 1 tablet (25 mg total) by mouth daily. 05/05/24  Yes Croitoru, Mihai, MD  metoprolol  succinate (TOPROL -XL) 25 MG 24 hr tablet Take 1 tablet (25 mg total) by mouth daily. 05/05/24  Yes Croitoru, Mihai, MD  rivaroxaban  (XARELTO ) 20 MG TABS tablet Take 1 tablet (20 mg total) by mouth daily wtih food. 05/05/24  Yes Croitoru, Mihai, MD    I have reviewed the patient's current medications.  Labs:     Latest Ref Rng & Units 09/20/2024    7:36 AM 09/20/2024   12:28 AM 08/06/2024    2:14 AM  BMP  Glucose 70 - 99 mg/dL 858  817  893   BUN 8 - 23 mg/dL 48  50  37   Creatinine 0.61 - 1.24 mg/dL 7.71  7.71  8.62   Sodium 135 - 145 mmol/L 138  137  138   Potassium 3.5 - 5.1 mmol/L 4.6  4.1  3.7   Chloride 98 - 111 mmol/L 101  100  100   CO2 22 - 32 mmol/L 24  27  26    Calcium  8.9 - 10.3 mg/dL 8.5  9.3  8.9     Urinalysis    Component Value Date/Time   COLORURINE STRAW (A) 09/19/2024 1836   APPEARANCEUR CLEAR 09/19/2024 1836   LABSPEC 1.012 09/19/2024 1836    PHURINE 5.0 09/19/2024 1836   GLUCOSEU >=500 (A) 09/19/2024 1836   HGBUR SMALL (A) 09/19/2024 1836   BILIRUBINUR NEGATIVE 09/19/2024 1836   KETONESUR NEGATIVE 09/19/2024 1836   PROTEINUR 30 (A) 09/19/2024 1836   NITRITE NEGATIVE 09/19/2024 1836   LEUKOCYTESUR NEGATIVE 09/19/2024 1836     ROS:  Shortness of breath (worse on laying flat and with exertion), RLQ abdominal pain.   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  Neuro: Alert and oriented x 4   Assessment/Plan: AKI  Cr 2.28 on admission from 1.37 one month prior. This could be due to cardiorenal syndrome given elevated BNP and dyspnea on presentation vs possibility of glomerulonephritis as discussed below.  - BMP tomorrow AM  - Continue lasix  per primary team discretion    Perinephric Stranding  CT renal stone study significant for moderate bilateral perinephric stranding, nonspecific. This may be inflammatory in nature, as can be seen with pyelonephritis or glomerulonephritis. However, no stones were seen on study and no clinical signs of infection. Patient denied other symptoms consistent with ANCA vasculitis such as respiratory or sinus issues.  - ANCA, ANA, Anti- GBM.   Abdominal Pain  Pain more abdominal and RLQ than flank pain. He has no CVA tenderness even with trying to elicit the pain. Less likely renal, though possible given above findings.  - Continue workup as above and per primary team discretion.   Dyspnea  Most likely related to his HFrEF. Currently being managed by  primary team and on BID IV lasix .   Areta Saliva 09/21/2024, 3:06 PM

## 2024-09-21 NOTE — Progress Notes (Addendum)
 PROGRESS NOTE    Pedro Kemp  FMW:[id number] DOB: Jul 31, 1956 DOA: 09/19/2024 PCP: Celestia Rosaline SQUIBB, NP  Chronically ill 68/M with history of CAD, ischemic cardiomyopathy, chronic systolic CHF with EF of 25-30%, history of VT arrest, ICD, paroxysmal A-fib on Eliquis , type 2 diabetes mellitus recent admission for CHF in October, discharged on Lasix  40 mg twice daily, Farxiga , losartan , presented to the ED with lateral abdominal pain and flank pain started 1 day prior, denied any NSAID use, denied fevers or chills, denied urinary symptoms. - In the ED he was noted to have AKI with creatinine of 2.2, WBC of 13, BNP 1284, CT abdomen pelvis noted moderate bilateral perinephric stranding, possibly inflammatory. -   Subjective: -Feels a little better, spoke to pt using IPAD interpreter  Assessment and Plan:  Acute kidney injury on CKD 3 Bilateral perinephric stranding -, Baseline creatinine around 1.3-1.4, admitted with creatinine of 2.2, renal CT yesterday notes bilateral perinephric stranding, no symptoms of UTI, UA not suggestive of infection or GN -start Empiric Ceftriaxone , FU Urine Cx -Farxiga  and losartan  on hold -Will request nephrology input -Check urine protein to creatinine ratio -Check procalcitonin  Chronic systolic CHF - last echo send 89/74 noted EF 25-30%, grade 2 DD, normal RV -BNP elevated, clinically does not appear volume overloaded - Hold off on further diuretics, holding GDMT with AKI - Continue metoprolol   History of CAD Multiple PCI and stents -Stable continue atorvastatin  and metoprolol  - Not on antiplatelet agents while on Xarelto   Paroxysmal A-fib Continue Xarelto  and metoprolol   DVT prophylaxis: Xarelto  Code Status: Full code Family Communication: None present Disposition Plan: Home likely 48 hours  Consultants:    Procedures:   Antimicrobials:    Objective: Vitals:   09/21/24 0341 09/21/24 0500 09/21/24 0738 09/21/24 0924  BP: 107/63   121/66 121/66  Pulse: 69  75 75  Resp: 20  17   Temp: 100 F (37.8 C)  99.6 F (37.6 C)   TempSrc: Oral  Oral   SpO2: 98%  100%   Weight:  61.9 kg    Height:        Intake/Output Summary (Last 24 hours) at 09/21/2024 0950 Last data filed at 09/21/2024 0837 Gross per 24 hour  Intake 340 ml  Output 1125 ml  Net -785 ml   Filed Weights   09/19/24 1836 09/21/24 0500  Weight: 59 kg 61.9 kg    Examination:  General exam: Appears calm and comfortable  Respiratory system: Clear to auscultation Cardiovascular system: S1 & S2 heard, RRR.  Abd: nondistended, soft and nontender.Normal bowel sounds heard. Central nervous system: Alert and oriented. No focal neurological deficits. Extremities: no edema Skin: No rashes Psychiatry:  Mood & affect appropriate.     Data Reviewed:   CBC: Recent Labs  Lab 09/19/24 1847 09/20/24 0736  WBC 11.0* 13.1*  NEUTROABS  --  11.1*  HGB 13.9 13.2  HCT 43.0 41.2  MCV 89.8 90.2  PLT 140* 106*   Basic Metabolic Panel: Recent Labs  Lab 09/20/24 0028 09/20/24 0325 09/20/24 0736  NA 137  --  138  K 4.1  --  4.6  CL 100  --  101  CO2 27  --  24  GLUCOSE 182*  --  141*  BUN 50*  --  48*  CREATININE 2.28*  --  2.28*  CALCIUM  9.3  --  8.5*  MG  --  2.3  --    GFR: Estimated Creatinine Clearance: 26.3 mL/min (A) (by C-G formula  based on SCr of 2.28 mg/dL (H)). Liver Function Tests: Recent Labs  Lab 09/20/24 0028 09/20/24 0736  AST 83* 61*  ALT 77* 62*  ALKPHOS 76 67  BILITOT 1.0 1.4*  PROT 7.2 6.4*  ALBUMIN 3.9 3.4*   Recent Labs  Lab 09/20/24 0028  LIPASE 24   No results for input(s): AMMONIA in the last 168 hours. Coagulation Profile: No results for input(s): INR, PROTIME in the last 168 hours. Cardiac Enzymes: No results for input(s): CKTOTAL, CKMB, CKMBINDEX, TROPONINI in the last 168 hours. BNP (last 3 results) No results for input(s): PROBNP in the last 8760 hours. HbA1C: No results for  input(s): HGBA1C in the last 72 hours. CBG: No results for input(s): GLUCAP in the last 168 hours. Lipid Profile: No results for input(s): CHOL, HDL, LDLCALC, TRIG, CHOLHDL, LDLDIRECT in the last 72 hours. Thyroid  Function Tests: No results for input(s): TSH, T4TOTAL, FREET4, T3FREE, THYROIDAB in the last 72 hours. Anemia Panel: No results for input(s): VITAMINB12, FOLATE, FERRITIN, TIBC, IRON, RETICCTPCT in the last 72 hours. Urine analysis:    Component Value Date/Time   COLORURINE STRAW (A) 09/19/2024 1836   APPEARANCEUR CLEAR 09/19/2024 1836   LABSPEC 1.012 09/19/2024 1836   PHURINE 5.0 09/19/2024 1836   GLUCOSEU >=500 (A) 09/19/2024 1836   HGBUR SMALL (A) 09/19/2024 1836   BILIRUBINUR NEGATIVE 09/19/2024 1836   KETONESUR NEGATIVE 09/19/2024 1836   PROTEINUR 30 (A) 09/19/2024 1836   NITRITE NEGATIVE 09/19/2024 1836   LEUKOCYTESUR NEGATIVE 09/19/2024 1836   Sepsis Labs: @LABRCNTIP (procalcitonin:4,lacticidven:4)  )No results found for this or any previous visit (from the past 240 hours).   Radiology Studies: CT Renal Stone Study Result Date: 09/20/2024 EXAM: CT UROGRAM 09/20/2024 02:01:23 AM TECHNIQUE: CT of the abdomen and pelvis was performed without the administration of intravenous contrast as per CT urogram protocol. Multiplanar reformatted images as well as MIP urogram images are provided for review. Automated exposure control, iterative reconstruction, and/or weight based adjustment of the mA/kV was utilized to reduce the radiation dose to as low as reasonably achievable. COMPARISON: None available. CLINICAL HISTORY: Abdominal/flank pain, stone suspected. FINDINGS: LOWER CHEST: Pacemaker leads noted within the right ventricle. Global cardiac size within normal limits. Trace bilateral pleural effusions and interstitial edema are noted within the visualized lung bases, possibly reflecting changes of cardiogenic failure. LIVER: The liver is  unremarkable. GALLBLADDER AND BILE DUCTS: Status post cholecystectomy. No biliary ductal dilatation. SPLEEN: No acute abnormality. PANCREAS: No acute abnormality. ADRENAL GLANDS: No acute abnormality. KIDNEYS, URETERS AND BLADDER: Moderate bilateral perinephric stranding, nonspecific. This may be inflammatory in nature, as can be seen with pyelonephritis or glomerulonephritis. No perinephric fluid collections are seen. No hydronephrosis. No intrarenal or ureteral calculi are seen. The bladder is unremarkable. GI AND BOWEL: Stomach demonstrates no acute abnormality. There is no bowel obstruction. PERITONEUM AND RETROPERITONEUM: No ascites. No free air. VASCULATURE: Extensive aortoiliac atherosclerotic calcifications. No aortic aneurysm. LYMPH NODES: No lymphadenopathy. REPRODUCTIVE ORGANS: Tiny bilateral fat-containing inguinal hernias. BONES AND SOFT TISSUES: Osseous structures are age-appropriate. No acute bone abnormality. No lytic or blastic bone lesion. No focal soft tissue abnormality. IMPRESSION: 1. No nephrolithiasis or obstructive uropathy. 2. Moderate bilateral perinephric stranding, nonspecific, possibly inflammatory (e.g., pyelonephritis or glomerulonephritis). Correlation with urinalysis and urine culture as well as renal function tests may be helpful for further management. Electronically signed by: Dorethia Molt MD 09/20/2024 02:30 AM EST RP Workstation: [id number]     Scheduled Meds:  atorvastatin   80 mg Oral Daily   metoprolol   succinate  25 mg Oral Daily   rivaroxaban   15 mg Oral Q supper   Continuous Infusions:  cefTRIAXone  (ROCEPHIN )  IV 2 g (09/21/24 0939)     LOS: 0 days    Time spent:    Sigurd Pac, MD [facility]   09/21/2024, 9:50 AM

## 2024-09-21 NOTE — TOC CM/SW Note (Signed)
 Transition of Care [facility]) - Inpatient Brief Assessment   Patient Details  Name: Trevious Rampey MRN: [id number] Date of Birth: 1955-11-08  [facility] CM/SW Contact:    Waddell Barnie Rama, RN Phone Number: 09/21/2024, 2:49 PM   Clinical Narrative: Using Video intrepreter [phone] Maribel- From home with nephews, has PCP and no  insurance on file, states has no HH services in place at this time or DME at home.  States will need a cab for transport  home at dc and family is support system, states gets medications from [facility].  Pta self ambulatory.   He does have at least 20.00 with him to pay for medications, will use Match Card if needed.  He states he will have transportation to get his follow up apt.      Transition of Care Asessment: Insurance and Status: Selfpay Patient has primary care physician: Yes Home environment has been reviewed: home with nephews Prior level of function:: indep Prior/Current Home Services: No current home services Social Drivers of Health Review: SDOH reviewed no interventions necessary Readmission risk has been reviewed: Yes Transition of care needs: transition of care needs identified, TOC will continue to follow

## 2024-09-21 NOTE — Care Management Obs Status (Signed)
 MEDICARE OBSERVATION STATUS NOTIFICATION   Patient Details  Name: Pedro Kemp MRN: [id number] Date of Birth: 24-Jan-1956   Medicare Observation Status Notification Given:  Yes    Vonzell Arrie Sharps 09/21/2024, 9:10 AM

## 2024-09-22 LAB — BASIC METABOLIC PANEL WITH GFR
Anion gap: 10 (ref 5–15)
BUN: 57 mg/dL — ABNORMAL HIGH (ref 8–23)
CO2: 26 mmol/L (ref 22–32)
Calcium: 8.3 mg/dL — ABNORMAL LOW (ref 8.9–10.3)
Chloride: 100 mmol/L (ref 98–111)
Creatinine, Ser: 2.41 mg/dL — ABNORMAL HIGH (ref 0.61–1.24)
GFR, Estimated: 29 mL/min — ABNORMAL LOW (ref >60)
Glucose, Bld: 185 mg/dL — ABNORMAL HIGH (ref 70–99)
Potassium: 4 mmol/L (ref 3.5–5.1)
Sodium: 136 mmol/L (ref 135–145)

## 2024-09-22 LAB — ANA W/REFLEX IF POSITIVE: Anti Nuclear Antibody (ANA): NEGATIVE

## 2024-09-22 LAB — URINE CULTURE: Culture: NO GROWTH

## 2024-09-22 LAB — URINALYSIS, ROUTINE W REFLEX MICROSCOPIC
Bilirubin Urine: NEGATIVE
Glucose, UA: >=500 mg/dL — AB
Hgb urine dipstick: NEGATIVE
Ketones, ur: NEGATIVE mg/dL
Leukocytes,Ua: NEGATIVE
Nitrite: NEGATIVE
Protein, ur: 30 mg/dL — AB
Specific Gravity, Urine: 1.01 (ref 1.005–1.030)
pH: 6 (ref 5.0–8.0)

## 2024-09-22 LAB — URINALYSIS, MICROSCOPIC (REFLEX)

## 2024-09-22 LAB — ANCA PROFILE
Anti-MPO Antibodies: <0.2 U (ref 0.0–0.9)
Anti-PR3 Antibodies: <0.2 U (ref 0.0–0.9)
Atypical P-ANCA titer: <1:20 {titer}
C-ANCA: <1:20 {titer}
P-ANCA: <1:20 {titer}

## 2024-09-22 LAB — GLOMERULAR BASEMENT MEMBRANE ANTIBODIES: GBM Ab: <0.2 U (ref 0.0–0.9)

## 2024-09-22 LAB — SEDIMENTATION RATE: Sed Rate: 31 mm/h — ABNORMAL HIGH (ref 0–16)

## 2024-09-22 MED ORDER — SODIUM CHLORIDE 0.9 % IV SOLN: INTRAVENOUS | Status: AC

## 2024-09-22 NOTE — Plan of Care (Signed)

## 2024-09-22 NOTE — Progress Notes (Signed)
 Heart Failure Navigator Progress Note  Assessed for Heart & Vascular TOC clinic readiness.  Patient does not meet criteria due to Per Dr. Fairy patient will follow up with [facility]. No HF TOC. .   Navigator will sign off at this time.   Stephane Haddock, BSN, Scientist, Clinical (histocompatibility And Immunogenetics) Only

## 2024-09-22 NOTE — Progress Notes (Signed)
 [facility] Progress Note  Name: Josyah Achor MRN: [id number] DOB: [birth year]  Chief Complaint:  Abdominal and flank pain  Subjective:  Feels ok. he had 2.4 liters UOP over 12/4.  Doesn't feel like he has extra fluid on.   Review of systems:  Denies shortness of breath or chest pain  Denies n/v  ---------------- Background on consult:  Ignacio Lowder is a 68 y.o. male with PMH of CAD, Ischemic cardiomyopathy, HFrEF, h/o VT arrest with ICD, PAF on eliquis , T2DM, who presented to the ED with abdominal pain and flank pain for a few days. He did not have any urinary symptoms, fevers, or chills. In the ED noted to have an AKI with Cr of 2.2 from baseline of about 1.3. BNP elevated at 1284.  CT renal study was notable for Moderate bilateral perinephric stranding, nonspecific. This may be inflammatory in nature, as can be seen with pyelonephritis or glomerulonephritis. No perinephric fluid collections are seen. No hydronephrosis. No intrarenal or ureteral calculi are seen.  UA with small hgb (0-5 RBC), protein 30.     Intake/Output Summary (Last 24 hours) at 09/22/2024 1937 Last data filed at 09/22/2024 1800 Gross per 24 hour  Intake 440 ml  Output 1300 ml  Net -860 ml    Vitals:  Vitals:   09/22/24 0109 09/22/24 0434 09/22/24 0534 09/22/24 0909  BP: 108/67 112/71  112/71  Pulse: 68 64  64  Resp: (!) 22 20    Temp: 98.9 F (37.2 C) 98.6 F (37 C)    TempSrc: Oral Oral    SpO2: 97% 97%    Weight:   61 kg   Height:         Physical Exam:  General adult male in bed in no acute distress HEENT normocephalic atraumatic extraocular movements intact sclera anicteric Neck supple trachea midline Lungs clear to auscultation bilaterally normal work of breathing at rest on room air Heart S1S2 no rub Abdomen soft nontender nondistended Extremities no edema  Psych normal mood and affect Neuro conversant and follows commands, answers questions  Medications  reviewed   Labs:     Latest Ref Rng & Units 09/22/2024    8:45 AM 09/20/2024    7:36 AM 09/20/2024   12:28 AM  BMP  Glucose 70 - 99 mg/dL 814  858  817   BUN 8 - 23 mg/dL 57  48  50   Creatinine 0.61 - 1.24 mg/dL 7.58  7.71  7.71   Sodium 135 - 145 mmol/L 136  138  137   Potassium 3.5 - 5.1 mmol/L 4.0  4.6  4.1   Chloride 98 - 111 mmol/L 100  101  100   CO2 22 - 32 mmol/L 26  24  27    Calcium  8.9 - 10.3 mg/dL 8.3  8.5  9.3      Assessment/Plan:   AKI on CKD 3a -pyelo vs GN vs cardiorenal.  UA with 30 mg/dL and 0-5 RBC.  BL Cr charted as 1.3  - agree with holding lasix  for now - NS at 75 ml/hr x 12 hours   - serologies sent and have returned - ANCA negative, anti GBM negative, ANA negative  - urine protein/cr ratio ordered but never sent -continue to hold ARB, SGTL2i for now  Acute on chronic systolic CHF -lasix  on hold, gentle fluids as above   Elevated LFT's, elevated bilirubin -congestive hepatopathy? Work up per primary, mildly elevated LFTs improved with diuresis   Perinephric stranding -on  rocephin , serologies negative as above   Thrombocytopenia - mild - per primary team  - CBC in AM  Disposition - continue inpatient monitoring    Katheryn JAYSON Saba, MD 09/22/2024 7:50 PM

## 2024-09-22 NOTE — Plan of Care (Signed)
 ?  Problem: Clinical Measurements: ?Goal: Will remain free from infection ?Outcome: Progressing ?  ?

## 2024-09-22 NOTE — Progress Notes (Signed)
 PROGRESS NOTE    Pedro Kemp  FMW:[id number] DOB: January 15, 1956 DOA: 09/19/2024 PCP: Celestia Rosaline SQUIBB, NP  Chronically ill 68/M with history of CAD, ischemic cardiomyopathy, chronic systolic CHF with EF of 25-30%, history of VT arrest, ICD, paroxysmal A-fib on Eliquis , type 2 diabetes mellitus recent admission for CHF in October, discharged on Lasix  40 mg twice daily, Farxiga , losartan , presented to the ED with lateral abdominal pain and flank pain started 1 day prior, denied any NSAID use, denied fevers or chills, denied urinary symptoms. - In the ED he was noted to have AKI with creatinine of 2.2, WBC of 13, BNP 1284, CT abdomen pelvis noted moderate bilateral perinephric stranding, possibly inflammatory.  Subjective: - Still with mild flank discomfort bilaterally, overall a little better, denies dyspnea, discussed using iPad interpreter  Assessment and Plan:  Acute kidney injury on CKD 3 Bilateral perinephric stranding -, Baseline creatinine around 1.3-1.4, admitted with creatinine of 2.2, renal CT 12/3 notes bilateral perinephric stranding, no symptoms of UTI, UA not suggestive of infection or GN - Urine cultures are negative, on empiric ceftriaxone  day 2, will continue for 4 to 5 days -Farxiga  and losartan  on hold - Appreciate nephrology consult, repeat urinalysis, follow-up connective tissue workup - Procalcitonin not largely suggestive of infection  Chronic systolic CHF - last echo send 89/74 noted EF 25-30%, grade 2 DD, normal RV -BNP elevated, clinically appears euvolemic - Hold off on further diuretics, holding GDMT with AKI - Continue metoprolol   History of CAD Multiple PCI and stents -Stable continue atorvastatin  and metoprolol  - Not on antiplatelet agents while on Xarelto   Paroxysmal A-fib Continue Xarelto  and metoprolol   DVT prophylaxis: Xarelto  Code Status: Full code Family Communication: None present Disposition Plan: Home likely 48 hours  Consultants:     Procedures:   Antimicrobials:    Objective: Vitals:   09/22/24 0109 09/22/24 0434 09/22/24 0534 09/22/24 0909  BP: 108/67 112/71  112/71  Pulse: 68 64  64  Resp: (!) 22 20    Temp: 98.9 F (37.2 C) 98.6 F (37 C)    TempSrc: Oral Oral    SpO2: 97% 97%    Weight:   61 kg   Height:        Intake/Output Summary (Last 24 hours) at 09/22/2024 1132 Last data filed at 09/22/2024 0700 Gross per 24 hour  Intake 820 ml  Output 1400 ml  Net -580 ml   Filed Weights   09/19/24 1836 09/21/24 0500 09/22/24 0534  Weight: 59 kg 61.9 kg 61 kg    Examination:  General exam: Appears calm and comfortable  Respiratory system: Clear to auscultation Cardiovascular system: S1 & S2 heard, RRR.  Abd: nondistended, soft and nontender.Normal bowel sounds heard. Central nervous system: Alert and oriented. No focal neurological deficits. Extremities: no edema Skin: No rashes Psychiatry:  Mood & affect appropriate.     Data Reviewed:   CBC: Recent Labs  Lab 09/19/24 1847 09/20/24 0736  WBC 11.0* 13.1*  NEUTROABS  --  11.1*  HGB 13.9 13.2  HCT 43.0 41.2  MCV 89.8 90.2  PLT 140* 106*   Basic Metabolic Panel: Recent Labs  Lab 09/20/24 0028 09/20/24 0325 09/20/24 0736 09/22/24 0845  NA 137  --  138 136  K 4.1  --  4.6 4.0  CL 100  --  101 100  CO2 27  --  24 26  GLUCOSE 182*  --  141* 185*  BUN 50*  --  48* 57*  CREATININE 2.28*  --  2.28* 2.41*  CALCIUM  9.3  --  8.5* 8.3*  MG  --  2.3  --   --    GFR: Estimated Creatinine Clearance: 24.9 mL/min (A) (by C-G formula based on SCr of 2.41 mg/dL (H)). Liver Function Tests: Recent Labs  Lab 09/20/24 0028 09/20/24 0736  AST 83* 61*  ALT 77* 62*  ALKPHOS 76 67  BILITOT 1.0 1.4*  PROT 7.2 6.4*  ALBUMIN 3.9 3.4*   Recent Labs  Lab 09/20/24 0028  LIPASE 24   No results for input(s): AMMONIA in the last 168 hours. Coagulation Profile: No results for input(s): INR, PROTIME in the last 168 hours. Cardiac  Enzymes: No results for input(s): CKTOTAL, CKMB, CKMBINDEX, TROPONINI in the last 168 hours. BNP (last 3 results) No results for input(s): PROBNP in the last 8760 hours. HbA1C: No results for input(s): HGBA1C in the last 72 hours. CBG: No results for input(s): GLUCAP in the last 168 hours. Lipid Profile: No results for input(s): CHOL, HDL, LDLCALC, TRIG, CHOLHDL, LDLDIRECT in the last 72 hours. Thyroid  Function Tests: No results for input(s): TSH, T4TOTAL, FREET4, T3FREE, THYROIDAB in the last 72 hours. Anemia Panel: No results for input(s): VITAMINB12, FOLATE, FERRITIN, TIBC, IRON, RETICCTPCT in the last 72 hours. Urine analysis:    Component Value Date/Time   COLORURINE STRAW (A) 09/19/2024 1836   APPEARANCEUR CLEAR 09/19/2024 1836   LABSPEC 1.012 09/19/2024 1836   PHURINE 5.0 09/19/2024 1836   GLUCOSEU >=500 (A) 09/19/2024 1836   HGBUR SMALL (A) 09/19/2024 1836   BILIRUBINUR NEGATIVE 09/19/2024 1836   KETONESUR NEGATIVE 09/19/2024 1836   PROTEINUR 30 (A) 09/19/2024 1836   NITRITE NEGATIVE 09/19/2024 1836   LEUKOCYTESUR NEGATIVE 09/19/2024 1836   Sepsis Labs: @LABRCNTIP (procalcitonin:4,lacticidven:4)  ) Recent Results (from the past 240 hours)  Urine Culture (for pregnant, neutropenic or urologic patients or patients with an indwelling urinary catheter)     Status: None   Collection Time: 09/21/24  8:44 AM   Specimen: Urine, Clean Catch  Result Value Ref Range Status   Specimen Description URINE, CLEAN CATCH  Final   Special Requests NONE  Final   Culture   Final    NO GROWTH Performed at [facility], [address] [place], [place] [address]    Report Status 09/22/2024 FINAL  Final     Radiology Studies: No results found.    Scheduled Meds:  atorvastatin   80 mg Oral Daily   feeding supplement  237 mL Oral BID BM   metoprolol  succinate  25 mg Oral Daily   multivitamin with minerals  1 tablet Oral Daily    rivaroxaban   15 mg Oral Q supper   Continuous Infusions:  cefTRIAXone  (ROCEPHIN )  IV 2 g (09/22/24 0914)     LOS: 1 day    Time spent:    Sigurd Pac, MD [facility]   09/22/2024, 11:32 AM

## 2024-09-23 ENCOUNTER — Inpatient Hospital Stay (HOSPITAL_COMMUNITY): Payer: Self-pay

## 2024-09-23 LAB — CBC
HCT: 37.1 % — ABNORMAL LOW (ref 39.0–52.0)
Hemoglobin: 12.3 g/dL — ABNORMAL LOW (ref 13.0–17.0)
MCH: 29.1 pg (ref 26.0–34.0)
MCHC: 33.2 g/dL (ref 30.0–36.0)
MCV: 87.9 fL (ref 80.0–100.0)
Platelets: 115 K/uL — ABNORMAL LOW (ref 150–400)
RBC: 4.22 MIL/uL (ref 4.22–5.81)
RDW: 13.7 % (ref 11.5–15.5)
WBC: 8.9 K/uL (ref 4.0–10.5)
nRBC: 0 % (ref 0.0–0.2)

## 2024-09-23 LAB — GLUCOSE, CAPILLARY
Glucose-Capillary: 317 mg/dL — ABNORMAL HIGH (ref 70–99)
Glucose-Capillary: 241 mg/dL — ABNORMAL HIGH (ref 70–99)
Glucose-Capillary: 200 mg/dL — ABNORMAL HIGH (ref 70–99)

## 2024-09-23 LAB — BASIC METABOLIC PANEL WITH GFR
Anion gap: 7 (ref 5–15)
BUN: 56 mg/dL — ABNORMAL HIGH (ref 8–23)
CO2: 27 mmol/L (ref 22–32)
Calcium: 8.4 mg/dL — ABNORMAL LOW (ref 8.9–10.3)
Chloride: 102 mmol/L (ref 98–111)
Creatinine, Ser: 2.14 mg/dL — ABNORMAL HIGH (ref 0.61–1.24)
GFR, Estimated: 33 mL/min — ABNORMAL LOW (ref >60)
Glucose, Bld: 176 mg/dL — ABNORMAL HIGH (ref 70–99)
Potassium: 3.6 mmol/L (ref 3.5–5.1)
Sodium: 136 mmol/L (ref 135–145)

## 2024-09-23 LAB — PROTEIN / CREATININE RATIO, URINE
Creatinine, Urine: 71 mg/dL
Protein Creatinine Ratio: 0.3 mg/mg{creat} — ABNORMAL HIGH (ref 0.00–0.15)
Total Protein, Urine: 21 mg/dL

## 2024-09-23 LAB — HIGH SENSITIVITY CRP: CRP, High Sensitivity: 205.74 mg/L — ABNORMAL HIGH (ref 0.00–3.00)

## 2024-09-23 MED ORDER — INSULIN ASPART 100 UNIT/ML IJ SOLN: 0.0000 [IU] | Freq: Three times a day (TID) | INTRAMUSCULAR | Status: DC

## 2024-09-23 MED ORDER — INSULIN ASPART 100 UNIT/ML IJ SOLN
0.0000 [IU] | Freq: Three times a day (TID) | INTRAMUSCULAR | Status: DC
  Administered 2024-09-23: 7 [IU] via SUBCUTANEOUS
  Administered 2024-09-23: 3 [IU] via SUBCUTANEOUS
  Administered 2024-09-24: 1 [IU] via SUBCUTANEOUS
  Administered 2024-09-24: 3 [IU] via SUBCUTANEOUS
  Administered 2024-09-24 – 2024-09-25 (×2): 1 [IU] via SUBCUTANEOUS
  Filled 2024-09-23 (×6): qty 1

## 2024-09-23 NOTE — Progress Notes (Signed)
 [facility] Progress Note  Name: Koehn Salehi MRN: [id number] DOB: [birth year]  Chief Complaint:  Abdominal and flank pain  Subjective:  He had 2.2 liters UOP over 12/5.  He feels ok today.  Lying down in bed without an issue.    Review of systems:    Denies shortness of breath or chest pain.  Doesn't have any swelling Denies n/v No difficulty with urinating  ---------------- Background on consult:  Ziv Welchel is a 68 y.o. male with PMH of CAD, Ischemic cardiomyopathy, HFrEF, h/o VT arrest with ICD, PAF on eliquis , T2DM, who presented to the ED with abdominal pain and flank pain for a few days. He did not have any urinary symptoms, fevers, or chills. In the ED noted to have an AKI with Cr of 2.2 from baseline of about 1.3. BNP elevated at 1284.  CT renal study was notable for Moderate bilateral perinephric stranding, nonspecific. This may be inflammatory in nature, as can be seen with pyelonephritis or glomerulonephritis. No perinephric fluid collections are seen. No hydronephrosis. No intrarenal or ureteral calculi are seen.  UA with small hgb (0-5 RBC), protein 30.     Intake/Output Summary (Last 24 hours) at 09/23/2024 1449 Last data filed at 09/23/2024 1358 Gross per 24 hour  Intake 1102.5 ml  Output 2000 ml  Net -897.5 ml    Vitals:  Vitals:   09/23/24 0400 09/23/24 0455 09/23/24 0737 09/23/24 1134  BP: 118/70  (!) 98/48 122/71  Pulse: 62   68  Resp: 16     Temp: 98.7 F (37.1 C)  98.6 F (37 C) 98.5 F (36.9 C)  TempSrc: Oral  Oral Oral  SpO2: 96%  94% 96%  Weight:  62 kg    Height:         Physical Exam:    General adult male in bed in no acute distress HEENT normocephalic atraumatic extraocular movements intact sclera anicteric Neck supple trachea midline Lungs right basilar crackles otherwise clear to auscultation bilaterally normal work of breathing at rest on room air Heart S1S2 no rub Abdomen soft nontender  nondistended Extremities no edema  Psych normal mood and affect Neuro conversant and follows commands, answers questions  Medications reviewed   Labs:     Latest Ref Rng & Units 09/23/2024    2:39 AM 09/22/2024    8:45 AM 09/20/2024    7:36 AM  BMP  Glucose 70 - 99 mg/dL 823  814  858   BUN 8 - 23 mg/dL 56  57  48   Creatinine 0.61 - 1.24 mg/dL 7.85  7.58  7.71   Sodium 135 - 145 mmol/L 136  136  138   Potassium 3.5 - 5.1 mmol/L 3.6  4.0  4.6   Chloride 98 - 111 mmol/L 102  100  101   CO2 22 - 32 mmol/L 27  26  24    Calcium  8.9 - 10.3 mg/dL 8.4  8.3  8.5      Assessment/Plan:   # AKI on CKD 3a - May be secondary to cardiorenal with his EF of 25-30%.  Per imaging, concern for possible pyelonephritis though GN considered and work-up unremarkable thus far.  UA with 30 mg/dL and 0-5 RBC.  BL Cr 1.3-1.6.  ANCA negative, anti GBM negative, ANA negative  - agree with holding lasix  for now - defer additional fluids today  - urine protein/cr ratio ordered but never sent - requested this be done  -continue to hold ARB, SGTL2i  for now  # Acute on chronic systolic CHF - note 07/2024 TTE with EF 25-30% -lasix  on hold; s/p gentle fluids as above     # Elevated LFT's, elevated bilirubin   -congestive hepatopathy?  - Work up per primary, mildly elevated LFTs improved with diuresis  # CKD stage 3a - baseline Cr 1.3 - 1.6   # Perinephric stranding -on rocephin , serologies negative as above   # Thrombocytopenia - mild - per primary team   Disposition - continue inpatient monitoring     Katheryn JAYSON Saba, MD 09/23/2024 3:02 PM

## 2024-09-23 NOTE — Progress Notes (Signed)
 PROGRESS NOTE    Pedro Kemp  FMW:[id number] DOB: 03-21-1956 DOA: 09/19/2024 PCP: Celestia Rosaline SQUIBB, NP  Chronically ill 68/M with history of CAD, ischemic cardiomyopathy, chronic systolic CHF with EF of 25-30%, history of VT arrest, ICD, paroxysmal A-fib on Eliquis , type 2 diabetes mellitus recent admission for CHF in October, discharged on Lasix  40 mg twice daily, Farxiga , losartan , presented to the ED with lateral abdominal pain and flank pain started 1 day prior, denied any NSAID use, denied fevers or chills, denied urinary symptoms. - In the ED he was noted to have AKI with creatinine of 2.2, WBC of 13, BNP 1284, CT abdomen pelvis noted moderate bilateral perinephric stranding, possibly inflammatory.  Subjective: - Still with mild flank discomfort bilaterally, overall a little better, denies dyspnea, discussed using iPad interpreter  Assessment and Plan:  Acute kidney injury on CKD 3 Bilateral perinephric stranding -, Baseline creatinine around 1.3-1.4, admitted with creatinine of 2.2, renal CT 12/3 notes bilateral perinephric stranding, no symptoms of UTI, UA not suggestive of infection or GN - Urine cultures are negative, on empiric ceftriaxone  day d3, continue for 5 days total -Farxiga  and losartan  on hold - Appreciate nephrology consult,, creatinine improving with fluids, repeat urinalysis unchanged, CRP significantly elevated, suggestive of underlying inflammation, etiology remains unclear, follow-up connective tissue workup - Procalcitonin largely unremarkable  Chronic systolic CHF - last echo send 89/74 noted EF 25-30%, grade 2 DD, normal RV -BNP elevated, clinically appears euvolemic - Hold off on further diuretics, holding GDMT with AKI - Continue metoprolol   History of CAD Multiple PCI and stents -Stable continue atorvastatin  and metoprolol  - Not on antiplatelet agents while on Xarelto   Paroxysmal A-fib Continue Xarelto  and metoprolol   DVT prophylaxis:  Xarelto  Code Status: Full code Family Communication: None present Disposition Plan: Home likely  1-2days  Consultants:    Procedures:   Antimicrobials:    Objective: Vitals:   09/23/24 0000 09/23/24 0400 09/23/24 0455 09/23/24 0737  BP: (!) 104/55 118/70  (!) 98/48  Pulse: (!) 50 62    Resp:  16    Temp:  98.7 F (37.1 C)  98.6 F (37 C)  TempSrc:  Oral  Oral  SpO2: 91% 96%  94%  Weight:   62 kg   Height:        Intake/Output Summary (Last 24 hours) at 09/23/2024 1041 Last data filed at 09/23/2024 0700 Gross per 24 hour  Intake 1102.5 ml  Output 2200 ml  Net -1097.5 ml   Filed Weights   09/21/24 0500 09/22/24 0534 09/23/24 0455  Weight: 61.9 kg 61 kg 62 kg    Examination:  General exam: Appears calm and comfortable  Respiratory system: Clear to auscultation Cardiovascular system: S1 & S2 heard, RRR.  Abd: nondistended, soft and nontender.Normal bowel sounds heard.  Mild flank tenderness Central nervous system: Alert and oriented. No focal neurological deficits. Extremities: no edema Skin: No rashes Psychiatry:  Mood & affect appropriate.     Data Reviewed:   CBC: Recent Labs  Lab 09/19/24 1847 09/20/24 0736 09/23/24 0239  WBC 11.0* 13.1* 8.9  NEUTROABS  --  11.1*  --   HGB 13.9 13.2 12.3*  HCT 43.0 41.2 37.1*  MCV 89.8 90.2 87.9  PLT 140* 106* 115*   Basic Metabolic Panel: Recent Labs  Lab 09/20/24 0028 09/20/24 0325 09/20/24 0736 09/22/24 0845 09/23/24 0239  NA 137  --  138 136 136  K 4.1  --  4.6 4.0 3.6  CL 100  --  101 100 102  CO2 27  --  24 26 27   GLUCOSE 182*  --  141* 185* 176*  BUN 50*  --  48* 57* 56*  CREATININE 2.28*  --  2.28* 2.41* 2.14*  CALCIUM  9.3  --  8.5* 8.3* 8.4*  MG  --  2.3  --   --   --    GFR: Estimated Creatinine Clearance: 28 mL/min (A) (by C-G formula based on SCr of 2.14 mg/dL (H)). Liver Function Tests: Recent Labs  Lab 09/20/24 0028 09/20/24 0736  AST 83* 61*  ALT 77* 62*  ALKPHOS 76 67   BILITOT 1.0 1.4*  PROT 7.2 6.4*  ALBUMIN 3.9 3.4*   Recent Labs  Lab 09/20/24 0028  LIPASE 24   No results for input(s): AMMONIA in the last 168 hours. Coagulation Profile: No results for input(s): INR, PROTIME in the last 168 hours. Cardiac Enzymes: No results for input(s): CKTOTAL, CKMB, CKMBINDEX, TROPONINI in the last 168 hours. BNP (last 3 results) No results for input(s): PROBNP in the last 8760 hours. HbA1C: No results for input(s): HGBA1C in the last 72 hours. CBG: No results for input(s): GLUCAP in the last 168 hours. Lipid Profile: No results for input(s): CHOL, HDL, LDLCALC, TRIG, CHOLHDL, LDLDIRECT in the last 72 hours. Thyroid  Function Tests: No results for input(s): TSH, T4TOTAL, FREET4, T3FREE, THYROIDAB in the last 72 hours. Anemia Panel: No results for input(s): VITAMINB12, FOLATE, FERRITIN, TIBC, IRON, RETICCTPCT in the last 72 hours. Urine analysis:    Component Value Date/Time   COLORURINE YELLOW 09/22/2024 1326   APPEARANCEUR CLEAR 09/22/2024 1326   LABSPEC 1.010 09/22/2024 1326   PHURINE 6.0 09/22/2024 1326   GLUCOSEU >=500 (A) 09/22/2024 1326   HGBUR NEGATIVE 09/22/2024 1326   BILIRUBINUR NEGATIVE 09/22/2024 1326   KETONESUR NEGATIVE 09/22/2024 1326   PROTEINUR 30 (A) 09/22/2024 1326   NITRITE NEGATIVE 09/22/2024 1326   LEUKOCYTESUR NEGATIVE 09/22/2024 1326   Sepsis Labs: @LABRCNTIP (procalcitonin:4,lacticidven:4)  ) Recent Results (from the past 240 hours)  Urine Culture (for pregnant, neutropenic or urologic patients or patients with an indwelling urinary catheter)     Status: None   Collection Time: 09/21/24  8:44 AM   Specimen: Urine, Clean Catch  Result Value Ref Range Status   Specimen Description URINE, CLEAN CATCH  Final   Special Requests NONE  Final   Culture   Final    NO GROWTH Performed at [facility], [address], [place], [place] [address]    Report Status  09/22/2024 FINAL  Final     Radiology Studies: No results found.    Scheduled Meds:  atorvastatin   80 mg Oral Daily   feeding supplement  237 mL Oral BID BM   metoprolol  succinate  25 mg Oral Daily   multivitamin with minerals  1 tablet Oral Daily   rivaroxaban   15 mg Oral Q supper   Continuous Infusions:  cefTRIAXone  (ROCEPHIN )  IV 2 g (09/23/24 0942)     LOS: 2 days    Time spent:    Sigurd Pac, MD [facility]   09/23/2024, 10:41 AM

## 2024-09-24 LAB — HEPATIC FUNCTION PANEL
ALT: 39 U/L (ref 0–44)
AST: 30 U/L (ref 15–41)
Albumin: 2.7 g/dL — ABNORMAL LOW (ref 3.5–5.0)
Alkaline Phosphatase: 61 U/L (ref 38–126)
Bilirubin, Direct: <0.1 mg/dL (ref 0.0–0.2)
Total Bilirubin: 0.8 mg/dL (ref 0.0–1.2)
Total Protein: 6.1 g/dL — ABNORMAL LOW (ref 6.5–8.1)

## 2024-09-24 LAB — BASIC METABOLIC PANEL WITH GFR
Anion gap: 10 (ref 5–15)
BUN: 45 mg/dL — ABNORMAL HIGH (ref 8–23)
CO2: 24 mmol/L (ref 22–32)
Calcium: 8.2 mg/dL — ABNORMAL LOW (ref 8.9–10.3)
Chloride: 107 mmol/L (ref 98–111)
Creatinine, Ser: 1.86 mg/dL — ABNORMAL HIGH (ref 0.61–1.24)
GFR, Estimated: 39 mL/min — ABNORMAL LOW (ref >60)
Glucose, Bld: 162 mg/dL — ABNORMAL HIGH (ref 70–99)
Potassium: 4 mmol/L (ref 3.5–5.1)
Sodium: 141 mmol/L (ref 135–145)

## 2024-09-24 LAB — CBC
HCT: 36.2 % — ABNORMAL LOW (ref 39.0–52.0)
Hemoglobin: 12.1 g/dL — ABNORMAL LOW (ref 13.0–17.0)
MCH: 29.3 pg (ref 26.0–34.0)
MCHC: 33.4 g/dL (ref 30.0–36.0)
MCV: 87.7 fL (ref 80.0–100.0)
Platelets: 127 K/uL — ABNORMAL LOW (ref 150–400)
RBC: 4.13 MIL/uL — ABNORMAL LOW (ref 4.22–5.81)
RDW: 13.5 % (ref 11.5–15.5)
WBC: 7.8 K/uL (ref 4.0–10.5)
nRBC: 0 % (ref 0.0–0.2)

## 2024-09-24 LAB — GLUCOSE, CAPILLARY
Glucose-Capillary: 145 mg/dL — ABNORMAL HIGH (ref 70–99)
Glucose-Capillary: 149 mg/dL — ABNORMAL HIGH (ref 70–99)
Glucose-Capillary: 248 mg/dL — ABNORMAL HIGH (ref 70–99)
Glucose-Capillary: 139 mg/dL — ABNORMAL HIGH (ref 70–99)
Glucose-Capillary: 248 mg/dL — ABNORMAL HIGH (ref 70–99)

## 2024-09-24 LAB — C-REACTIVE PROTEIN: CRP: 6.8 mg/dL — ABNORMAL HIGH (ref <1.0)

## 2024-09-24 MED ORDER — POTASSIUM CHLORIDE CRYS ER 20 MEQ PO TBCR
20.0000 meq | EXTENDED_RELEASE_TABLET | Freq: Once | ORAL | Status: AC
  Administered 2024-09-24: 20 meq via ORAL
  Filled 2024-09-24: qty 1

## 2024-09-24 MED ORDER — FUROSEMIDE 40 MG PO TABS: 40.0000 mg | ORAL_TABLET | Freq: Two times a day (BID) | ORAL | Status: DC

## 2024-09-24 MED ORDER — FUROSEMIDE 10 MG/ML IJ SOLN
40.0000 mg | Freq: Once | INTRAMUSCULAR | Status: AC
  Administered 2024-09-24: 40 mg via INTRAVENOUS
  Filled 2024-09-24: qty 4

## 2024-09-24 NOTE — Progress Notes (Signed)
 PROGRESS NOTE    Pedro Kemp  FMW:[id number] DOB: 1956-04-24 DOA: 09/19/2024 PCP: Celestia Rosaline SQUIBB, NP  Chronically ill 68/M with history of CAD, ischemic cardiomyopathy, chronic systolic CHF with EF of 25-30%, history of VT arrest, ICD, paroxysmal A-fib on Eliquis , type 2 diabetes mellitus recent admission for CHF in October, discharged on Lasix  40 mg twice daily, Farxiga , losartan , presented to the ED with lateral abdominal pain and flank pain started 1 day prior, denied any NSAID use, denied fevers or chills, denied urinary symptoms. - In the ED he was noted to have AKI with creatinine of 2.2, WBC of 13, BNP 1284, CT abdomen pelvis noted moderate bilateral perinephric stranding, possibly inflammatory.  Subjective: - Feels better overall, flank pain and discomfort has resolved, mild dyspnea overnight  Assessment and Plan:  Acute kidney injury on CKD 3 Bilateral perinephric stranding -, Baseline creatinine around 1.3-1.4, admitted with creatinine of 2.2, renal CT 12/3 notes bilateral perinephric stranding, no symptoms of UTI, UA not suggestive of infection or GN - Urine cultures are negative, on empiric ceftriaxone  day d4, continue for 5 days total -Farxiga  and losartan  on hold - Appreciate nephrology consult,, creatinine improving, suspect resolving ATN, repeat UA unchanged  - At this point it appears to be cardiorenal, Lasix  X1 today - BMP in a.m., home soon, restart Farxiga  at discharge  Chronic systolic CHF - last echo send 89/74 noted EF 25-30%, grade 2 DD, normal RV -BNP elevated, clinically appears euvolemic - Hold off on further diuretics, holding GDMT with AKI - Continue metoprolol   History of CAD Multiple PCI and stents -Stable continue atorvastatin  and metoprolol  - Not on antiplatelet agents while on Xarelto   Paroxysmal A-fib Continue Xarelto  and metoprolol   Type 2 diabetes mellitus - A1c 7.5 earlier this year, resume Farxiga  at discharge =-SSI for  now  DVT prophylaxis: Xarelto  Code Status: Full code Family Communication: None present Disposition Plan: Home tomorrow  Consultants:    Procedures:   Antimicrobials:    Objective: Vitals:   09/24/24 0028 09/24/24 0500 09/24/24 0557 09/24/24 0724  BP: 112/64  110/69 114/65  Pulse: (!) 59  63 60  Resp:   20 17  Temp:   98 F (36.7 C) 98.2 F (36.8 C)  TempSrc:   Oral Oral  SpO2: 94%  99% 98%  Weight:  62.6 kg    Height:        Intake/Output Summary (Last 24 hours) at 09/24/2024 1020 Last data filed at 09/24/2024 0742 Gross per 24 hour  Intake --  Output 1000 ml  Net -1000 ml   Filed Weights   09/22/24 0534 09/23/24 0455 09/24/24 0500  Weight: 61 kg 62 kg 62.6 kg    Examination:  General exam: Appears calm and comfortable  Respiratory system: Decreased at the bases Cardiovascular system: S1 & S2 heard, RRR.  Abd: nondistended, soft and nontender.Normal bowel sounds heard.  Pain and tenderness has resolved Central nervous system: Alert and oriented. No focal neurological deficits. Extremities: no edema Skin: No rashes Psychiatry:  Mood & affect appropriate.     Data Reviewed:   CBC: Recent Labs  Lab 09/19/24 1847 09/20/24 0736 09/23/24 0239 09/24/24 0237  WBC 11.0* 13.1* 8.9 7.8  NEUTROABS  --  11.1*  --   --   HGB 13.9 13.2 12.3* 12.1*  HCT 43.0 41.2 37.1* 36.2*  MCV 89.8 90.2 87.9 87.7  PLT 140* 106* 115* 127*   Basic Metabolic Panel: Recent Labs  Lab 09/20/24 0028 09/20/24 0325 09/20/24 0736  09/22/24 0845 09/23/24 0239 09/24/24 0237  NA 137  --  138 136 136 141  K 4.1  --  4.6 4.0 3.6 4.0  CL 100  --  101 100 102 107  CO2 27  --  24 26 27 24   GLUCOSE 182*  --  141* 185* 176* 162*  BUN 50*  --  48* 57* 56* 45*  CREATININE 2.28*  --  2.28* 2.41* 2.14* 1.86*  CALCIUM  9.3  --  8.5* 8.3* 8.4* 8.2*  MG  --  2.3  --   --   --   --    GFR: Estimated Creatinine Clearance: 32.3 mL/min (A) (by C-G formula based on SCr of 1.86 mg/dL  (H)). Liver Function Tests: Recent Labs  Lab 09/20/24 0028 09/20/24 0736 09/24/24 0237  AST 83* 61* 30  ALT 77* 62* 39  ALKPHOS 76 67 61  BILITOT 1.0 1.4* 0.8  PROT 7.2 6.4* 6.1*  ALBUMIN 3.9 3.4* 2.7*   Recent Labs  Lab 09/20/24 0028  LIPASE 24   No results for input(s): AMMONIA in the last 168 hours. Coagulation Profile: No results for input(s): INR, PROTIME in the last 168 hours. Cardiac Enzymes: No results for input(s): CKTOTAL, CKMB, CKMBINDEX, TROPONINI in the last 168 hours. BNP (last 3 results) No results for input(s): PROBNP in the last 8760 hours. HbA1C: No results for input(s): HGBA1C in the last 72 hours. CBG: Recent Labs  Lab 09/23/24 1130 09/23/24 1627 09/23/24 2227 09/24/24 0549 09/24/24 0602  GLUCAP 317* 241* 200* 145* 149*   Lipid Profile: No results for input(s): CHOL, HDL, LDLCALC, TRIG, CHOLHDL, LDLDIRECT in the last 72 hours. Thyroid  Function Tests: No results for input(s): TSH, T4TOTAL, FREET4, T3FREE, THYROIDAB in the last 72 hours. Anemia Panel: No results for input(s): VITAMINB12, FOLATE, FERRITIN, TIBC, IRON, RETICCTPCT in the last 72 hours. Urine analysis:    Component Value Date/Time   COLORURINE YELLOW 09/22/2024 1326   APPEARANCEUR CLEAR 09/22/2024 1326   LABSPEC 1.010 09/22/2024 1326   PHURINE 6.0 09/22/2024 1326   GLUCOSEU >=500 (A) 09/22/2024 1326   HGBUR NEGATIVE 09/22/2024 1326   BILIRUBINUR NEGATIVE 09/22/2024 1326   KETONESUR NEGATIVE 09/22/2024 1326   PROTEINUR 30 (A) 09/22/2024 1326   NITRITE NEGATIVE 09/22/2024 1326   LEUKOCYTESUR NEGATIVE 09/22/2024 1326   Sepsis Labs: @LABRCNTIP (procalcitonin:4,lacticidven:4)  ) Recent Results (from the past 240 hours)  Urine Culture (for pregnant, neutropenic or urologic patients or patients with an indwelling urinary catheter)     Status: None   Collection Time: 09/21/24  8:44 AM   Specimen: Urine, Clean Catch  Result  Value Ref Range Status   Specimen Description URINE, CLEAN CATCH  Final   Special Requests NONE  Final   Culture   Final    NO GROWTH Performed at [facility], [address] [place], [place] [address]    Report Status 09/22/2024 FINAL  Final     Radiology Studies: DG CHEST PORT 1 VIEW Result Date: 09/23/2024 EXAM: 1 VIEW(S) XRAY OF THE CHEST 09/23/2024 11:58:00 AM COMPARISON: 08/04/2024 CLINICAL HISTORY: Dyspnea FINDINGS: LINES, TUBES AND DEVICES: Left chest AICD in place. LUNGS AND PLEURA: Decreased mild pulmonary edema. Previous interstitial edema has essentially resolved. Mild residual pulmonary venous hypertension. Previous left pleural effusion has resolved. No pneumothorax. HEART AND MEDIASTINUM: Cardiomegaly. Aortic atherosclerosis. No acute abnormality of the mediastinal silhouette. BONES AND SOFT TISSUES: No acute osseous abnormality. IMPRESSION: 1. Decreased mild pulmonary edema with mild residual pulmonary venous hypertension. 2. Previous  interstitial pulmonary edema and left pleural effusion have resolved. 3. Cardiomegaly. 4. Aortic atherosclerosis. Electronically signed by: Ryan Salvage MD 09/23/2024 04:38 PM EST RP Workstation: [id number]      Scheduled Meds:  atorvastatin   80 mg Oral Daily   feeding supplement  237 mL Oral BID BM   insulin  aspart  0-9 Units Subcutaneous TID WC   metoprolol  succinate  25 mg Oral Daily   multivitamin with minerals  1 tablet Oral Daily   rivaroxaban   15 mg Oral Q supper   Continuous Infusions:  cefTRIAXone  (ROCEPHIN )  IV 2 g (09/24/24 1009)     LOS: 3 days    Time spent:    Sigurd Pac, MD [facility]   09/24/2024, 10:20 AM

## 2024-09-24 NOTE — Progress Notes (Signed)
 [facility] Progress Note  Name: Pedro Kemp MRN: [id number] DOB: [birth year]  Chief Complaint:  Abdominal and flank pain  Subjective:  He had 500 mL UOP over 12/6 and has had 1.9 liters UOP thus far on 12/7.  He got lasix  40 mg IV once earlier today.  Usually takes lasix  twice a day.      Review of systems:      States was a little short of breath earlier; no chest pain. Denies n/v No difficulty with urinating  ---------------- Background on consult:  Pedro Kemp is a 68 y.o. male with PMH of CAD, Ischemic cardiomyopathy, HFrEF, h/o VT arrest with ICD, PAF on eliquis , T2DM, who presented to the ED with abdominal pain and flank pain for a few days. He did not have any urinary symptoms, fevers, or chills. In the ED noted to have an AKI with Cr of 2.2 from baseline of about 1.3. BNP elevated at 1284.  CT renal study was notable for Moderate bilateral perinephric stranding, nonspecific. This may be inflammatory in nature, as can be seen with pyelonephritis or glomerulonephritis. No perinephric fluid collections are seen. No hydronephrosis. No intrarenal or ureteral calculi are seen.  UA with small hgb (0-5 RBC), protein 30.     Intake/Output Summary (Last 24 hours) at 09/24/2024 1630 Last data filed at 09/24/2024 1300 Gross per 24 hour  Intake 120 ml  Output 1900 ml  Net -1780 ml    Vitals:  Vitals:   09/24/24 0500 09/24/24 0557 09/24/24 0724 09/24/24 1154  BP:  110/69 114/65 123/66  Pulse:  63 60 61  Resp:  20 17 16   Temp:  98 F (36.7 C) 98.2 F (36.8 C) 98.3 F (36.8 C)  TempSrc:  Oral Oral Oral  SpO2:  99% 98% 91%  Weight: 62.6 kg     Height:         Physical Exam:     General adult male in bed in no acute distress HEENT normocephalic atraumatic extraocular movements intact sclera anicteric Neck supple trachea midline Lungs clear and unlabored at rest and on room air Heart S1S2 no rub Abdomen soft nontender nondistended Extremities no  edema  Psych normal mood and affect Neuro conversant and follows commands, answers questions  Medications reviewed   Labs:     Latest Ref Rng & Units 09/24/2024    2:37 AM 09/23/2024    2:39 AM 09/22/2024    8:45 AM  BMP  Glucose 70 - 99 mg/dL 837  823  814   BUN 8 - 23 mg/dL 45  56  57   Creatinine 0.61 - 1.24 mg/dL 8.13  7.85  7.58   Sodium 135 - 145 mmol/L 141  136  136   Potassium 3.5 - 5.1 mmol/L 4.0  3.6  4.0   Chloride 98 - 111 mmol/L 107  102  100   CO2 22 - 32 mmol/L 24  27  26    Calcium  8.9 - 10.3 mg/dL 8.2  8.4  8.3      Assessment/Plan:   # AKI on CKD 3a - Most likely secondary to cardiorenal with his EF of 25-30%.  Per imaging, concern for possible pyelonephritis; however, GN was considered and work-up unremarkable thus far.  UA with 30 mg/dL and 0-5 RBC.  BL Cr 1.3-1.6.  ANCA negative, anti GBM negative, ANA negative.  Up/cr ratio 300 mg/g  - Diuretics as needed to optimize cardiopulmonary status  -continue to hold ARB, SGTL2i for now  #  Acute on chronic systolic CHF - note 07/2024 TTE with EF 25-30% - lasix  give once today and would resume oral diuretic regimen tomorrow.  Home dose reported as lasix  40 mg BID     # Elevated LFT's, elevated bilirubin   -congestive hepatopathy?  - Work up per primary, mildly elevated LFTs improved with diuresis  # CKD stage 3a - baseline Cr 1.3 - 1.6   # Perinephric stranding - on rocephin , serologies negative as above   Disposition - per primary team.  Nephrology will set up a follow-up appointment in 2-3 weeks.  Will notify our scheduling team that he is Spanish speaking.  Nephrology will sign off.  Thank you for the consult.  Please do not hesitate to contact me with any questions regarding the patient   Katheryn JAYSON Saba, MD 09/24/2024 4:46 PM

## 2024-09-24 NOTE — Plan of Care (Signed)

## 2024-09-25 LAB — BASIC METABOLIC PANEL WITH GFR
Anion gap: 10 (ref 5–15)
BUN: 38 mg/dL — ABNORMAL HIGH (ref 8–23)
CO2: 25 mmol/L (ref 22–32)
Calcium: 8.3 mg/dL — ABNORMAL LOW (ref 8.9–10.3)
Chloride: 105 mmol/L (ref 98–111)
Creatinine, Ser: 1.62 mg/dL — ABNORMAL HIGH (ref 0.61–1.24)
GFR, Estimated: 46 mL/min — ABNORMAL LOW (ref >60)
Glucose, Bld: 155 mg/dL — ABNORMAL HIGH (ref 70–99)
Potassium: 4.1 mmol/L (ref 3.5–5.1)
Sodium: 140 mmol/L (ref 135–145)

## 2024-09-25 LAB — CBC
HCT: 39.5 % (ref 39.0–52.0)
Hemoglobin: 13.1 g/dL (ref 13.0–17.0)
MCH: 29 pg (ref 26.0–34.0)
MCHC: 33.2 g/dL (ref 30.0–36.0)
MCV: 87.6 fL (ref 80.0–100.0)
Platelets: 155 K/uL (ref 150–400)
RBC: 4.51 MIL/uL (ref 4.22–5.81)
RDW: 13.4 % (ref 11.5–15.5)
WBC: 8.7 K/uL (ref 4.0–10.5)
nRBC: 0 % (ref 0.0–0.2)

## 2024-09-25 LAB — GLUCOSE, CAPILLARY
Glucose-Capillary: 122 mg/dL — ABNORMAL HIGH (ref 70–99)
Glucose-Capillary: 238 mg/dL — ABNORMAL HIGH (ref 70–99)

## 2024-09-25 MED ORDER — FUROSEMIDE 40 MG PO TABS: 40.0000 mg | ORAL_TABLET | Freq: Two times a day (BID) | ORAL | Status: DC

## 2024-09-25 MED ORDER — FUROSEMIDE 10 MG/ML IJ SOLN
40.0000 mg | Freq: Once | INTRAMUSCULAR | Status: AC
  Administered 2024-09-25: 40 mg via INTRAVENOUS
  Filled 2024-09-25: qty 4

## 2024-09-25 NOTE — Progress Notes (Signed)
   Heart Failure Stewardship Pharmacist Progress Note   PCP: Celestia Rosaline SQUIBB, NP PCP-Cardiologist: Jerel Balding, MD    HPI:  68 yo M with PMH of CHF, VT arrest, CAD, ICM, T2DM, CKD II, and afib.  Admitted 10/17-10/19 with acute on chronic CHF. Discharged on increased lasix  dose 60 mg BID.   Presented to the ED on 12/2 with right sided flank pain. Found to have AKI. BNP elevated.   Met with patient at bedside. Utilized Estate Manager/land Agent [phone]. Denies shortness of breath, chest pain, palpitations, lightheadedness, or dizziness. No LE edema. States that he hasn't had issues with shortness of breath or swelling in about a month. Does not use a pill box at home. Discussed with patient about changes to GDMT given AKI and holding therapies to allow for renal recovery. Creatinine is improving. Anticipated discharge today  Current HF Medications: Diuretic: furosemide  40 mg PO BID Beta Blocker: metoprolol  XL 25 mg daily  Prior to admission HF Medications: Diuretic: furosemide  40 mg BID - patient confirms taking twice daily, not PRN Beta blocker: metoprolol  XL 25 mg daily ACE/ARB/ARNI: losartan  25 mg daily SGLT2i: Farxiga  10 mg daily  Pertinent Lab Values: Serum creatinine 1.62, BUN 38, Potassium 4.1, Sodium 140, BNP 1284, Magnesium  2.3   Vital Signs: Weight: 137 lbs (admission weight: 130 lbs) Blood pressure: 100-110/50s  Heart rate: 70s  I/O: net -6.1L since admission  Medication Assistance / Insurance Benefits Check: Does the patient have prescription insurance?  No  Does the patient qualify for medication assistance through manufacturers or grants?   Yes Eligible grants and/or patient assistance programs: Xarelto , Farxiga  Medication assistance applications in progress: none Medication assistance applications approved: Xarelto , Farxiga  Approved medication assistance renewals will be completed by: [facility]  Outpatient Pharmacy:  Prior to admission outpatient pharmacy: [facility] Is the patient willing to use [facility] TOC pharmacy at discharge? Yes    Assessment: 1. Chronic systolic CHF (LVEF 25-30%), due to ICM. NYHA class II symptoms. - Volume status looks ok. Continue PTA lasix  40 mg BID. Strict I/Os and daily weights. Keep K>4 and Mg>2. - Continue metoprolol  XL 25 mg daily - Holding losartan  and Farxiga  with AKI   Plan: 1) Medication changes recommended at this time: - May be able to restart Farxiga  at discharge vs at follow up  2) Patient assistance: - No insurance - has been approved for Farxiga  and Xarelto  patient assistance already  3)  Education  - Patient has been educated on current HF medications and potential additions to HF medication regimen - Patient verbalizes understanding that over the next few months, these medication doses may change and more medications may be added to optimize HF regimen - Patient has been educated on basic disease state pathophysiology and goals of therapy   Duwaine Plant, PharmD, BCPS Heart Failure Stewardship Pharmacist Phone [phone]

## 2024-09-25 NOTE — Progress Notes (Signed)
 Heart Failure Nurse Navigator Progress Note  PCP: Celestia Rosaline SQUIBB, NP PCP-Cardiologist: Cro[place]u Admission Diagnosis: AKI Admitted from: Home   Presentation:   Pedro Kemp presented with right sided flank pain, pain with urination for 2 days. BP 314/65, HR 73, BNP 1,284, Creatine 2.28, potassium 4.1.  CT scan shows possible glomerulonephritis. Urinalysis showed no white blood cells, leukocyte esterase/nitrate negative, no bacteria, and demonstrate 30 protein. Started on Rocephin .   Patient re-educated on the sign and symptoms of heart failure, Daily weights, when to call his doctor or go to the ED. Diet/ fluid restrictions, taking all medications as prescribed and attending all medical appointments. Patient verbalized his understanding , a Spanish version of Living Better with Heart Failure book was given to patient at bedside. A HF TOC follow up appointment per Dr. Fairy was scheduled for 10/02/2024 @ 8:15 am.   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  Tobacco Use   Smoking status: Never    Passive exposure: Never   Smokeless tobacco: Never  Vaping Use   Vaping status: Never Used  Substance and Sexual Activity   Alcohol use: Never   Drug use: Never   Sexual activity: Not Currently    Partners: Female  Other Topics Concern   Not on file  Social History Narrative   Not on file   Social Drivers of Health   Financial Resource Strain: High Risk  (08/25/2023)   Overall Financial Resource Strain (CARDIA)    Difficulty of Paying Living Expenses: Hard  Food Insecurity: Food Insecurity Present (09/20/2024)   Hunger Vital Sign    Worried About Running Out of Food in the Last Year: Sometimes true    Ran Out of Food in the Last Year: Sometimes true  Transportation Needs: Unmet Transportation Needs (09/20/2024)   PRAPARE - Administrator, Civil Service (Medical): Yes    Lack of Transportation (Non-Medical): Yes  Physical Activity: Not on file  Stress: Not on file  Social Connections: Moderately Isolated (09/20/2024)   Social Connection and Isolation Panel    Frequency of Communication with Friends and Family: More than three times a week    Frequency of Social Gatherings with Friends and Family: More than three times a week    Attends Religious Services: 1 to 4 times per year    Active Member of [employer] or Organizations: No    Attends Banker Meetings: Never    Marital Status: Widowed   Education Assessment and Provision:  Detailed education and instructions provided on heart failure disease management including the following:  Signs and symptoms of Heart Failure When to call the physician Importance of daily weights Low sodium diet Fluid restriction Medication management Anticipated future follow-up appointments  Patient education given on each of the above topics.  Patient acknowledges understanding via teach back method and acceptance of all instructions.  Education Materials:  Living Better With Heart Failure Booklet, HF zone tool, & Daily Weight Tracker Tool.  Patient has scale at home: Yes Patient has pill box at home: NA    High Risk Criteria for Readmission and/or Poor Patient Outcomes: Heart failure hospital admissions (last 6 months): 2  No Show rate: 20% Difficult social situation: Lives with his nephew Demonstrates medication adherence: yes Primary Language: Spanish  Literacy level:  Reading, writing in spanish   Barriers of Care:   Diet/ fluid restrictions ( salt/ water over 64 oz)  Daily weights  Considerations/Referrals:   Referral made to Heart Failure Pharmacist Stewardship: yes Referral made to Heart Failure CSW/NCM TOC: NA, reports he has a ride to appointment ( his nephew)  Referral made to Heart & Vascular TOC clinic: Yes, per Dr. Fairy, HF TOC F/U 10/02/2024 @ 8:15 am.   Items for Follow-up on DC/TOC: Continued HF education Diet/ fluid restrictions/ daily weights   Stephane Haddock, BSN, RN Heart Failure Teacher, Adult Education Only

## 2024-09-25 NOTE — TOC Transition Note (Signed)
 Transition of Care [facility]) - Discharge Note   Patient Details  Name: Pedro Kemp MRN: [id number] Date of Birth: 21-Nov-1955  [facility] CM/SW Contact:  Waddell Barnie Rama, RN Phone Number: 09/25/2024, 10:38 AM   Clinical Narrative:    For dc today, he has 20.00 on him to pay for medications,  he will need a cab in dc lounge for transportation.         Patient Goals and CMS Choice            Discharge Placement                       Discharge Plan and Services Additional resources added to the After Visit Summary for                                       Social Drivers of Health (SDOH) Interventions SDOH Screenings   Food Insecurity: Food Insecurity Present (09/20/2024)  Housing: High Risk (09/20/2024)  Transportation Needs: Unmet Transportation Needs (09/20/2024)  Utilities: Not At Risk (09/20/2024)  Alcohol Screen: Low Risk  (08/25/2023)  Depression (PHQ2-9): Low Risk  (11/11/2022)  Financial Resource Strain: High Risk (08/25/2023)  Social Connections: Moderately Isolated (09/20/2024)  Tobacco Use: Low Risk  (09/19/2024)     Readmission Risk Interventions    09/21/2024    2:48 PM 08/26/2023   12:29 PM  Readmission Risk Prevention Plan  Transportation Screening Complete Complete  PCP or Specialist Appt within 5-7 Days Complete Not Complete  Not Complete comments  follow up apt 11/18  Home Care Screening Complete Complete  Medication Review (RN CM) Complete Complete

## 2024-09-25 NOTE — Discharge Summary (Signed)
 Physician Discharge Summary  Pedro Kemp FMW:[id number] DOB: July 28, 1956 DOA: 09/19/2024  PCP: Celestia Rosaline SQUIBB, NP  Admit date: 09/19/2024 Discharge date: 09/25/2024  Time spent: 45 minutes  Recommendations for Outpatient Follow-up:  PCP, 12/1 CHF TOC clinic in 10 days  Discharge Diagnoses:  Principal Problem:   AKI (acute kidney injury) Acute on chronic systolic CHF   Protein-calorie malnutrition, severe History of CAD Paroxysmal A-fib Type 2 diabetes mellitus  Discharge Condition: Improved, diabetic  Diet recommendation: Low-sodium, heart healthy  Filed Weights   09/23/24 0455 09/24/24 0500 09/25/24 0458  Weight: 62 kg 62.6 kg 62.2 kg    History of present illness:  Chronically ill 68/M with history of CAD, ischemic cardiomyopathy, chronic systolic CHF with EF of 25-30%, history of VT arrest, ICD, paroxysmal A-fib on Eliquis , type 2 diabetes mellitus recent admission for CHF in October, discharged on Lasix  40 mg twice daily, Farxiga , losartan , presented to the ED with lateral abdominal pain and flank pain started 1 day prior, denied any NSAID use, denied fevers or chills, denied urinary symptoms. - In the ED he was noted to have AKI with creatinine of 2.2, WBC of 13, BNP 1284, CT abdomen pelvis noted moderate bilateral perinephric stranding, possibly inflammatory.  Hospital Course:   Acute kidney injury on CKD 3 Bilateral perinephric stranding - Presented to the ED with bilateral abdominal and flank pain, noted to have AKI,, Baseline creatinine around 1.3-1.4, admitted with creatinine of 2.2, renal CT 12/3 notes bilateral perinephric stranding, no symptoms of UTI, UA not suggestive of infection or GN, initially there was suspicion for pyelonephritis or acute glomerulonephritis, this was ruled out -Seen by nephrology in consultation, cultures negative, antibiotics discontinued -Farxiga  and losartan  held on admission - Appreciate nephrology consult,, creatinine  improving, suspect resolving ATN,, cardiorenal syndrome - Given Lasix  IV yesterday, creatinine continues to improve -Transition back to home regimen of Lasix  40 mg twice daily, resume Farxiga , hold ARB at discharge   Chronic systolic CHF - last echo send 89/74 noted EF 25-30%, grade 2 DD, normal RV -BNP elevated, initially appeared euvolemic then was slightly volume overloaded after he was given fluids per nephrology -Now improving with diuresis and resolving ATN -See discussion above, Lasix  and Farxiga  resumed, losartan  discontinued - Continue metoprolol  -CAD TOC clinic follow-up   History of CAD Multiple PCI and stents -Stable continue atorvastatin  and metoprolol  - Not on antiplatelet agents while on Xarelto    Paroxysmal A-fib Continue Xarelto  and metoprolol    Type 2 diabetes mellitus - A1c 7.5 earlier this year, resume Farxiga  at discharge  Consultations: Nephrology  Discharge Exam: Vitals:   09/25/24 0458 09/25/24 0710  BP: (!) 103/54 (!) 116/54  Pulse: 62 69  Resp: (!) 22 20  Temp: 98.8 F (37.1 C) 98.8 F (37.1 C)  SpO2: 96% 98%   Gen: Awake, Alert, Oriented X 3,  HEENT: no JVD Lungs: Good air movement bilaterally, CTAB CVS: S1S2/RRR Abd: soft, Non tender, non distended, BS present Extremities: No edema Skin: no new rashes on exposed skin   Discharge Instructions   Discharge Instructions     Diet - low sodium heart healthy   Complete by: As directed    Diet Carb Modified   Complete by: As directed    Increase activity slowly   Complete by: As directed       Allergies as of 09/25/2024   No Known Allergies      Medication List     STOP taking these medications    losartan  25  MG tablet Commonly known as: Cozaar        TAKE these medications    atorvastatin  80 MG tablet Commonly known as: LIPITOR  Tome 1 tableta (80 mg en total) por va oral diariamente. (Take 1 tablet (80 mg total) by mouth daily.)   dapagliflozin  propanediol 10 MG  Tabs tablet Commonly known as: Farxiga  Tome 1 tableta (10 mg en total) por va oral diariamente antes de desayunar. (Take 1 tablet (10 mg total) by mouth daily before breakfast.)   furosemide  40 MG tablet Commonly known as: LASIX  Take 1 tablet (40 mg total) by mouth 2 (two) times daily. Tomar una tableta (40 mg) dos veces al dia. What changed: additional instructions   metoprolol  succinate 25 MG 24 hr tablet Commonly known as: TOPROL -XL Tome 1 tableta (25 mg en total) por va oral diariamente. (Take 1 tablet (25 mg total) by mouth daily.)   rivaroxaban  20 MG Tabs tablet Commonly known as: XARELTO  Tome 1 tableta (20 mg en total) por va oral diariamente con alimentos. (Take 1 tablet (20 mg total) by mouth daily wtih food.)       No Known Allergies  Follow-up Information     Jaycee Greig PARAS, NP Follow up on 09/28/2024.   Specialty: Nurse Practitioner Why: 10:00 am for hospital follow up Contact information: [address] [place] [address] [phone]                  The results of significant diagnostics from this hospitalization (including imaging, microbiology, ancillary and laboratory) are listed below for reference.    Significant Diagnostic Studies: DG CHEST PORT 1 VIEW Result Date: 09/23/2024 EXAM: 1 VIEW(S) XRAY OF THE CHEST 09/23/2024 11:58:00 AM COMPARISON: 08/04/2024 CLINICAL HISTORY: Dyspnea FINDINGS: LINES, TUBES AND DEVICES: Left chest AICD in place. LUNGS AND PLEURA: Decreased mild pulmonary edema. Previous interstitial edema has essentially resolved. Mild residual pulmonary venous hypertension. Previous left pleural effusion has resolved. No pneumothorax. HEART AND MEDIASTINUM: Cardiomegaly. Aortic atherosclerosis. No acute abnormality of the mediastinal silhouette. BONES AND SOFT TISSUES: No acute osseous abnormality. IMPRESSION: 1. Decreased mild pulmonary edema with mild residual pulmonary venous hypertension. 2. Previous interstitial  pulmonary edema and left pleural effusion have resolved. 3. Cardiomegaly. 4. Aortic atherosclerosis. Electronically signed by: Ryan Salvage MD 09/23/2024 04:38 PM EST RP Workstation: [id number]   CT Renal Stone Study Result Date: 09/20/2024 EXAM: CT UROGRAM 09/20/2024 02:01:23 AM TECHNIQUE: CT of the abdomen and pelvis was performed without the administration of intravenous contrast as per CT urogram protocol. Multiplanar reformatted images as well as MIP urogram images are provided for review. Automated exposure control, iterative reconstruction, and/or weight based adjustment of the mA/kV was utilized to reduce the radiation dose to as low as reasonably achievable. COMPARISON: None available. CLINICAL HISTORY: Abdominal/flank pain, stone suspected. FINDINGS: LOWER CHEST: Pacemaker leads noted within the right ventricle. Global cardiac size within normal limits. Trace bilateral pleural effusions and interstitial edema are noted within the visualized lung bases, possibly reflecting changes of cardiogenic failure. LIVER: The liver is unremarkable. GALLBLADDER AND BILE DUCTS: Status post cholecystectomy. No biliary ductal dilatation. SPLEEN: No acute abnormality. PANCREAS: No acute abnormality. ADRENAL GLANDS: No acute abnormality. KIDNEYS, URETERS AND BLADDER: Moderate bilateral perinephric stranding, nonspecific. This may be inflammatory in nature, as can be seen with pyelonephritis or glomerulonephritis. No perinephric fluid collections are seen. No hydronephrosis. No intrarenal or ureteral calculi are seen. The bladder is unremarkable. GI AND BOWEL: Stomach demonstrates no acute abnormality. There is no bowel obstruction.  PERITONEUM AND RETROPERITONEUM: No ascites. No free air. VASCULATURE: Extensive aortoiliac atherosclerotic calcifications. No aortic aneurysm. LYMPH NODES: No lymphadenopathy. REPRODUCTIVE ORGANS: Tiny bilateral fat-containing inguinal hernias. BONES AND SOFT TISSUES: Osseous structures  are age-appropriate. No acute bone abnormality. No lytic or blastic bone lesion. No focal soft tissue abnormality. IMPRESSION: 1. No nephrolithiasis or obstructive uropathy. 2. Moderate bilateral perinephric stranding, nonspecific, possibly inflammatory (e.g., pyelonephritis or glomerulonephritis). Correlation with urinalysis and urine culture as well as renal function tests may be helpful for further management. Electronically signed by: Dorethia Molt MD 09/20/2024 02:30 AM EST RP Workstation: [id number]    Microbiology: Recent Results (from the past 240 hours)  Urine Culture (for pregnant, neutropenic or urologic patients or patients with an indwelling urinary catheter)     Status: None   Collection Time: 09/21/24  8:44 AM   Specimen: Urine, Clean Catch  Result Value Ref Range Status   Specimen Description URINE, CLEAN CATCH  Final   Special Requests NONE  Final   Culture   Final    NO GROWTH Performed at [facility], [address] [place], [place] [address]    Report Status 09/22/2024 FINAL  Final     Labs: Basic Metabolic Panel: Recent Labs  Lab 09/20/24 0325 09/20/24 0736 09/22/24 0845 09/23/24 0239 09/24/24 0237 09/25/24 0250  NA  --  138 136 136 141 140  K  --  4.6 4.0 3.6 4.0 4.1  CL  --  101 100 102 107 105  CO2  --  24 26 27 24 25   GLUCOSE  --  141* 185* 176* 162* 155*  BUN  --  48* 57* 56* 45* 38*  CREATININE  --  2.28* 2.41* 2.14* 1.86* 1.62*  CALCIUM   --  8.5* 8.3* 8.4* 8.2* 8.3*  MG 2.3  --   --   --   --   --    Liver Function Tests: Recent Labs  Lab 09/20/24 0028 09/20/24 0736 09/24/24 0237  AST 83* 61* 30  ALT 77* 62* 39  ALKPHOS 76 67 61  BILITOT 1.0 1.4* 0.8  PROT 7.2 6.4* 6.1*  ALBUMIN 3.9 3.4* 2.7*   Recent Labs  Lab 09/20/24 0028  LIPASE 24   No results for input(s): AMMONIA in the last 168 hours. CBC: Recent Labs  Lab 09/19/24 1847 09/20/24 0736 09/23/24 0239 09/24/24 0237  WBC 11.0* 13.1* 8.9 7.8  NEUTROABS  --  11.1*   --   --   HGB 13.9 13.2 12.3* 12.1*  HCT 43.0 41.2 37.1* 36.2*  MCV 89.8 90.2 87.9 87.7  PLT 140* 106* 115* 127*   Cardiac Enzymes: No results for input(s): CKTOTAL, CKMB, CKMBINDEX, TROPONINI in the last 168 hours. BNP: BNP (last 3 results) Recent Labs    06/29/24 0255 08/04/24 0401 09/20/24 0736  BNP 608.0* 945.2* 1,284.0*    ProBNP (last 3 results) No results for input(s): PROBNP in the last 8760 hours.  CBG: Recent Labs  Lab 09/24/24 0602 09/24/24 1152 09/24/24 1651 09/24/24 2122 09/25/24 0619  GLUCAP 149* 248* 139* 248* 122*       Signed:  Sigurd Pac MD.  [facility] 09/25/2024, 11:03 AM

## 2024-09-25 NOTE — Plan of Care (Signed)
  Problem: Clinical Measurements: Goal: Will remain free from infection Outcome: Progressing Goal: Respiratory complications will improve Outcome: Progressing Goal: Cardiovascular complication will be avoided Outcome: Progressing   

## 2024-09-25 NOTE — Progress Notes (Signed)
 AVS completed for discharge packet and placed with chart.

## 2024-09-26 ENCOUNTER — Telehealth: Payer: Self-pay | Admitting: *Deleted

## 2024-09-26 NOTE — Transitions of Care (Post Inpatient/ED Visit) (Signed)
   09/26/2024  Name: Joash Tony MRN: [id number] DOB: [birth year]  Today's TOC FU Call Status: Today's TOC FU Call Status:: Unsuccessful Call (1st Attempt) Unsuccessful Call (1st Attempt) Date: 09/26/24  Attempted to reach the patient regarding the most recent Inpatient/ED visit.  Follow Up Plan: Additional outreach attempts will be made to reach the patient to complete the Transitions of Care (Post Inpatient/ED visit) call.   Andrea Dimes RN, BSN [place]  [facility] RN Care Manager [phone]

## 2024-09-26 NOTE — Progress Notes (Incomplete)
 HEART & VASCULAR TRANSITION OF CARE CONSULT NOTE   Referring Physician: PCP: Celestia Rosaline SQUIBB, NP  Cardiologist: Jerel Balding, MD  HPI: Referred to clinic by *** for heart failure consultation.   Pedro Kemp is a 68 y.o. spanish speaking male with a history of CAD s/p mid LAD shockwave and PCI, diagonal PCI July 2022, VT arrest s/p ICD, HFrEF (EF 30-35%), PAF, and poorly controlled T2DM (Hgb A1c 9). Received prior cardiac care at [facility] in [place]. Dr. Balding now follows his ICD.   Previously seen in [facility] 12/24. After 2 prior admissions for acute decompensated heart failure in 2024. LHC at the time showed diffuse disease with patent LAD stent, high-grade disease in diag, LCX, and moderate disease in RCA. RHC with elevated L sided filling pressures with low cardiac output by fick 3.6/2.0.  Now admitted 4 times in the past year for acute decompensated heart failure and NSTEMI requiring IV diuresis. Most recently admitted 12/25 with pre-renal AKI (Cr 2.2), he was diuresed with IV Lasix  with improvement of AKI. He was discharged on lasix  and farxiga . Losartan  was held.   Most recent echo showed EF 25-30%, G2DD, nl RV  Today {he/she/they:23295} presents for transition of care visit. Overall feeling ***. NYHA ***. Reports {Symptoms; cardiac:12860::dyspnea,fatigue}. Denies {Symptoms; cardiac:12860::chest pain,dyspnea,fatigue,near-syncope,orthopnea,palpitations,dizziness,abnormal bleeding}. Able to perform ADLs. Appetite okay. Weight at home ***. BP at home***. Compliant with all medications. Denies ETOH, tobacco, or drug use.    Cardiac Testing:      Past Medical History:  Diagnosis Date   Cardiac arrest [facility])    CHF (congestive heart failure) (HCC)    Coronary artery disease    Diabetes mellitus without complication (HCC)    H/O heart artery stent    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    Current Outpatient  Medications  Medication Sig Dispense Refill   atorvastatin  (LIPITOR ) 80 MG tablet Take 1 tablet (80 mg total) by mouth daily. 90 tablet 3   dapagliflozin  propanediol (FARXIGA ) 10 MG TABS tablet Take 1 tablet (10 mg total) by mouth daily before breakfast. 90 tablet 3   furosemide  (LASIX ) 40 MG tablet Take 1 tablet (40 mg total) by mouth 2 (two) times daily. Tomar una tableta (40 mg) dos veces al dia.     metoprolol  succinate (TOPROL -XL) 25 MG 24 hr tablet Take 1 tablet (25 mg total) by mouth daily. 90 tablet 3   rivaroxaban  (XARELTO ) 20 MG TABS tablet Take 1 tablet (20 mg total) by mouth daily wtih food. 90 tablet 3   No current facility-administered medications for this visit.    No Known Allergies  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  Tobacco Use   Smoking status: Never    Passive exposure: Never   Smokeless tobacco: Never  Vaping Use   Vaping status: Never Used  Substance and Sexual Activity   Alcohol use: Never   Drug use: Never   Sexual activity: Not Currently    Partners: Female  Other Topics Concern   Not on file  Social History Narrative   Not on file   Social Drivers of Health   Financial Resource Strain: High Risk (08/25/2023)   Overall Physicist, Medical Strain (  CARDIA)    Difficulty of Paying Living Expenses: Hard  Food Insecurity: Food Insecurity Present (09/20/2024)   Hunger Vital Sign    Worried About Running Out of Food in the Last Year: Sometimes true    Ran Out of Food in the Last Year: Sometimes true  Transportation Needs: Unmet Transportation Needs (09/20/2024)   PRAPARE - Administrator, Civil Service (Medical): Yes    Lack of Transportation (Non-Medical): Yes  Physical Activity: Not on file  Stress: Not on file  Social Connections:  Moderately Isolated (09/20/2024)   Social Connection and Isolation Panel    Frequency of Communication with Friends and Family: More than three times a week    Frequency of Social Gatherings with Friends and Family: More than three times a week    Attends Religious Services: 1 to 4 times per year    Active Member of [employer] or Organizations: No    Attends Banker Meetings: Never    Marital Status: Widowed  Intimate Partner Violence: Not At Risk (09/20/2024)   Humiliation, Afraid, Rape, and Kick questionnaire    Fear of Current or Ex-Partner: No    Emotionally Abused: No    Physically Abused: No    Sexually Abused: No    Family History  Problem Relation Age of Onset   Diabetes Mother    Diabetes Brother     There were no vitals filed for this visit.  There were no vitals filed for this visit.  PHYSICAL EXAM: General: *** appearing. No distress  Cardiac: JVP ~*** cm. No murmurs  Resp: Lung sounds clear and equal B/L Abdomen: Soft, non-distended.  Extremities: Warm and dry.  *** edema.  Neuro: A&O x3. Affect pleasant.   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 Cath in 11/24 with diffuse disease with patent LAD stent, high-grade disease in diag, LCX, and moderate disease in RCA. Treated medically. Low cardiac output by fick 3.6/2.0 with LVEDP 27-30. - Most recent echo 10/25: EF 25-30%, G2DD, RV ok - NYHA ***. *** - continue *** - GDMT: ? blocker: continue toprol  XL 25 mg daily ARB/ARNI:  MRA:  SGLT2i: continue farxiga  10 mg daily  Paroxysmal Afib - NSR on ECG *** - continue xarelto  20 mg daily  CAD - s/p PCI to LAD in 2022. Cath 11/24 with patient stent and diffuse disease as above. Treated medically. - continue atorvastatin  - on xarelto  20 mg daily    Referred to HFSW (PCP, Medications, Transportation, ETOH Abuse, Drug Abuse, Insurance, Financial ): {yes/no:20286} Refer to Pharmacy:  {yes/no:20286} Refer to Home Health: {yes/no:20286} Refer to Advanced Heart Failure Clinic: {yes/no:20286}  Refer to General Cardiology: {yes/no:20286}  Follow up with ***  Wilkes Potvin, NP 09/26/24

## 2024-09-27 ENCOUNTER — Telehealth: Payer: Self-pay | Admitting: *Deleted

## 2024-09-27 NOTE — Transitions of Care (Post Inpatient/ED Visit) (Signed)
 09/27/2024  Name: Jesse Nosbisch MRN: [id number] DOB: 1956/04/16  Today's TOC FU Call Status: Today's TOC FU Call Status:: Successful TOC FU Call Completed TOC FU Call Complete Date: 09/27/24   Call made while utilizing Spanish Interpreter # [phone], via [employer].  Patient's Name and Date of Birth confirmed. Name, DOB  Transition Care Management Follow-up Telephone Call Date of Discharge: 09/25/24 Discharge Facility: [facility]) Type of Discharge: Inpatient Admission Primary Inpatient Discharge Diagnosis:: AKI (acute kidney injury) How have you been since you were released from the hospital?: Better Any questions or concerns?: No  Items Reviewed: Did you receive and understand the discharge instructions provided?: Yes Medications obtained,verified, and reconciled?: Yes (Medications Reviewed) Any new allergies since your discharge?: No Dietary orders reviewed?: Yes Type of Diet Ordered:: low sodium heart healthy,  Carb Modified Do you have support at home?: Yes People in Home [RPT]: other relative(s) Name of Support/Comfort Primary Source: Niece  Medications Reviewed Today: Medications Reviewed Today     Reviewed by Lucky Andrea LABOR, RN (Registered Nurse) on 09/27/24 at 1631  Med List Status: <None>   Medication Order Taking? Sig Documenting Provider Last Dose Status Informant  atorvastatin  (LIPITOR ) 80 MG tablet [id number] Yes Take 1 tablet (80 mg total) by mouth daily. Croitoru, Mihai, MD  Active Self, Pharmacy Records, Spouse/Significant Other  dapagliflozin  propanediol (FARXIGA ) 10 MG TABS tablet [id number] Yes Take 1 tablet (10 mg total) by mouth daily before breakfast. Croitoru, Mihai, MD  Active Self, Pharmacy Records, Spouse/Significant Other           Med Note (COFFELL, JON HERO   Wed Sep 20, 2024 12:30 PM) Patient confirmed he is still receiving samples from MD.  furosemide  (LASIX ) 40 MG tablet [id number] Yes Take 1 tablet (40 mg total) by mouth 2  (two) times daily. Tomar una tableta (40 mg) dos veces al dia. Fairy Frames, MD  Active   metoprolol  succinate (TOPROL -XL) 25 MG 24 hr tablet [id number] Yes Take 1 tablet (25 mg total) by mouth daily. Croitoru, Mihai, MD  Active Self, Pharmacy Records, Spouse/Significant Other  rivaroxaban  (XARELTO ) 20 MG TABS tablet [id number] Yes Take 1 tablet (20 mg total) by mouth daily wtih food. Croitoru, Mihai, MD  Active Self, Pharmacy Records, Spouse/Significant Other           Med Note (COFFELL, JON HERO   Wed Sep 20, 2024 12:30 PM) Patient confirmed he is still receiving samples from MD.            Home Care and Equipment/Supplies: Were Home Health Services Ordered?: No Any new equipment or medical supplies ordered?: No  Functional Questionnaire: Do you need assistance with bathing/showering or dressing?: No Do you need assistance with meal preparation?: No Do you need assistance with eating?: No Do you have difficulty maintaining continence: No Do you need assistance with getting out of bed/getting out of a chair/moving?: No Do you have difficulty managing or taking your medications?: No  Follow up appointments reviewed: PCP Follow-up appointment confirmed?: Yes Date of PCP follow-up appointment?: 09/28/24 Follow-up Provider: Greig Chute, NP at [facility] office [facility] Follow-up appointment confirmed?: Yes Date of Specialist follow-up appointment?: 10/02/24 Follow-Up Specialty Provider:: HF [facility] Do you need transportation to your follow-up appointment?: No Do you understand care options if your condition(s) worsen?: Yes-patient verbalized understanding  SDOH Interventions Today    Flowsheet Row Most Recent Value  SDOH Interventions   Food Insecurity Interventions Other (Comment)  [patient received resources on discharge  paperwork and will review and follow up]  Housing Interventions Other (Comment)  [Patient currently lives with his family]   Transportation Interventions Other (Comment)  [Patient will ask his family to assist with transportation]  Utilities Interventions Intervention Not Indicated    Goals Addressed             This Visit's Progress    VBCI Transitions of Care (TOC) Care Plan       Problems:  Recent Hospitalization for treatment of CHF Knowledge Deficit Related to CHF management  Goal:  Over the next 30 days, the patient will not experience hospital readmission  Interventions:  Transitions of Care: Doctor Visits  - discussed the importance of doctor visits SDOH assessment-discussed resources included on discharge instructions, advised patient to follow up on provided resources Medication review-advised patient to take all medications to PCP appointment on 09/28/24 and HF appointment on 10/02/24 Utilized Spanish interpreter via [employer] Reviewed diet  Patient Self Care Activities:  Attend all scheduled provider appointments Call pharmacy for medication refills 3-7 days in advance of running out of medications Call provider office for new concerns or questions  Notify RN Care Manager of TOC call rescheduling needs Participate in Transition of Care Program/Attend TOC scheduled calls Take medications as prescribed   use salt in moderation watch for swelling in feet, ankles and legs every day  Plan:  Telephone follow up appointment with care management team member scheduled for:  10/02/24 at 3:15pm       Discussed and offered 30 day TOC program.  Patient        enrolled.  The patient has been provided with contact information for the care management team and has been advised to call with any health -related questions or concerns.  The patient verbalized understanding with current plan of care.  The patient is directed to their insurance card regarding availability of benefits coverage.   Andrea Dimes RN, BSN [place]  [facility] RN Care  Manager [phone]

## 2024-09-28 ENCOUNTER — Ambulatory Visit (INDEPENDENT_AMBULATORY_CARE_PROVIDER_SITE_OTHER): Payer: Self-pay | Admitting: Family

## 2024-09-28 ENCOUNTER — Encounter: Payer: Self-pay | Admitting: Family

## 2024-09-28 VITALS — BP 121/70 | HR 61 | Temp 97.6°F | Resp 18 | Ht 64.0 in | Wt 139.8 lb

## 2024-09-28 DIAGNOSIS — E1165 Type 2 diabetes mellitus with hyperglycemia: Secondary | ICD-10-CM

## 2024-09-28 DIAGNOSIS — I480 Paroxysmal atrial fibrillation: Secondary | ICD-10-CM

## 2024-09-28 DIAGNOSIS — Z8679 Personal history of other diseases of the circulatory system: Secondary | ICD-10-CM

## 2024-09-28 DIAGNOSIS — N179 Acute kidney failure, unspecified: Secondary | ICD-10-CM

## 2024-09-28 DIAGNOSIS — E119 Type 2 diabetes mellitus without complications: Secondary | ICD-10-CM

## 2024-09-28 DIAGNOSIS — Z603 Acculturation difficulty: Secondary | ICD-10-CM

## 2024-09-28 DIAGNOSIS — Z09 Encounter for follow-up examination after completed treatment for conditions other than malignant neoplasm: Secondary | ICD-10-CM

## 2024-09-28 DIAGNOSIS — I5022 Chronic systolic (congestive) heart failure: Secondary | ICD-10-CM

## 2024-09-28 DIAGNOSIS — Z0189 Encounter for other specified special examinations: Secondary | ICD-10-CM

## 2024-09-28 DIAGNOSIS — N183 Chronic kidney disease, stage 3 unspecified: Secondary | ICD-10-CM

## 2024-09-28 LAB — POCT GLYCOSYLATED HEMOGLOBIN (HGB A1C): Hemoglobin A1C: 8.4 % — AB (ref 4.0–5.6)

## 2024-09-28 NOTE — Progress Notes (Signed)
 Patient ID: Abbie Jablon, male    DOB: [birth year]  MRN: [id number]  CC: Hospital Discharge Follow-Up  Subjective: Pedro Kemp is a 68 y.o. male who presents for hospital discharge follow-up.   His concerns today include:  Patient seen on 09/19/2024 - 09/25/2024 (6 days) at [facility] for acute kidney injury and additional diagnoses. Today patient reports feeling improved and denies red flag symptoms. He is doing well on medication regimen, no issues/concerns. He is aware of upcoming appointment with Cardiology. He does not complain of red flag symptoms such as but not limited to chest pain, shortness of breath, worst headache of life, nausea/vomiting.   Patient Active Problem List   Diagnosis Date Noted   Protein-calorie malnutrition, severe 09/21/2024   AKI (acute kidney injury) 09/20/2024   Acute on chronic systolic congestive heart failure (HCC) 08/04/2024   Non-STEMI (non-ST elevated myocardial infarction) (HCC) 04/13/2024   Ischemic cardiomyopathy 01/08/2024   Long term (current) use of anticoagulants 01/08/2024   Acute on chronic systolic CHF (congestive heart failure) (HCC) 01/07/2024   history of VT arrest 01/07/2024   Multifocal pneumonia 08/24/2023   Acute respiratory failure with hypoxia (HCC) 08/24/2023   Inability to urinate 08/24/2023   NSTEMI (non-ST elevated myocardial infarction) (HCC) 08/24/2023   Paroxysmal atrial fibrillation (HCC) 11/12/2022   NSVT (nonsustained ventricular tachycardia) (HCC) 11/12/2022   ICD (implantable cardioverter-defibrillator) in place 11/12/2022   Stage 3a chronic kidney disease (HCC) 11/12/2022   RTA (renal tubular acidosis) 11/12/2022   Acute on chronic systolic (congestive) heart failure (HCC) 10/24/2022   Acute on chronic HFrEF (heart failure with reduced ejection fraction) (HCC) 01/29/2022   Coronary artery disease 01/29/2022   History of cardiac arrest 01/29/2022   Leukocytosis 01/29/2022   Type 2 diabetes  mellitus with hyperlipidemia (HCC) 07/22/2021   Chronic combined systolic (congestive) and diastolic (congestive) heart failure (HCC) 07/22/2021   Hyperlipidemia associated with type 2 diabetes mellitus (HCC) 07/22/2021   Essential hypertension 07/22/2021     Medications Ordered Prior to Encounter[1]  Allergies[2]  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  Tobacco Use   Smoking status: Never    Passive exposure: Never   Smokeless tobacco: Never  Vaping Use   Vaping status: Never Used  Substance and Sexual Activity   Alcohol use: Never   Drug use: Never   Sexual activity: Not Currently    Partners: Female  Other Topics Concern   Not on file  Social History Narrative   Not on file   Social Drivers of Health   Tobacco Use: Low Risk (09/28/2024)   Patient History    Smoking Tobacco Use: Never    Smokeless Tobacco Use: Never    Passive Exposure: Never  Financial Resource Strain: High Risk (08/25/2023)   Overall Financial Resource Strain (CARDIA)    Difficulty of Paying Living Expenses: Hard  Food Insecurity: Food Insecurity Present (09/27/2024)   Epic    Worried About Programme Researcher, Broadcasting/film/video in the Last Year: Sometimes true    [employer] of Food in the Last Year: Sometimes true  Transportation Needs: Unmet Transportation Needs (09/27/2024)   Epic    Lack of Transportation (Medical): Yes    Lack of Transportation (Non-Medical): Yes  Physical Activity: Not on  file  Stress: No Stress Concern Present (09/28/2024)   [employer] of Occupational Health - Occupational Stress Questionnaire    Feeling of Stress: Only a little  Social Connections: Moderately Isolated (09/20/2024)   Social Connection and Isolation Panel    Frequency of Communication with Friends and Family: More than  three times a week    Frequency of Social Gatherings with Friends and Family: More than three times a week    Attends Religious Services: 1 to 4 times per year    Active Member of [employer] or Organizations: No    Attends Banker Meetings: Never    Marital Status: Widowed  Intimate Partner Violence: Not At Risk (09/27/2024)   Epic    Fear of Current or Ex-Partner: No    Emotionally Abused: No    Physically Abused: No    Sexually Abused: No  Depression (PHQ2-9): Low Risk (09/28/2024)   Depression (PHQ2-9)    PHQ-2 Score: 1  Alcohol Screen: Low Risk (08/25/2023)   Alcohol Screen    Last Alcohol Screening Score (AUDIT): 0  Housing: High Risk (09/27/2024)   Epic    Unable to Pay for Housing in the Last Year: Yes    Number of Times Moved in the Last Year: Not on file    Homeless in the Last Year: No  Utilities: Not At Risk (09/27/2024)   Epic    Threatened with loss of utilities: No  Health Literacy: Not on file    Family History  Problem Relation Age of Onset   Diabetes Mother    Diabetes Brother     Past Surgical History:  Procedure Laterality Date   CARDIAC CATHETERIZATION     ICD IMPLANT     RIGHT/LEFT HEART CATH AND CORONARY ANGIOGRAPHY N/A 08/24/2023   Procedure: RIGHT/LEFT HEART CATH AND CORONARY ANGIOGRAPHY;  Surgeon: Wendel Lurena POUR, MD;  Location: [facility] INVASIVE CV LAB;  Service: Cardiovascular;  Laterality: N/A;    ROS: Review of Systems Negative except as stated above  PHYSICAL EXAM: BP 121/70   Pulse 61   Temp 97.6 F (36.4 C) (Oral)   Resp 18   Ht 5' 4 (1.626 m)   Wt 139 lb 12.8 oz (63.4 kg)   SpO2 98%   BMI 24.00 kg/m   Physical Exam HENT:     Head: Normocephalic and atraumatic.     Nose: Nose normal.     Mouth/Throat:     Mouth: Mucous membranes are moist.     Pharynx: Oropharynx is clear.  Eyes:     Extraocular Movements: Extraocular movements intact.     Conjunctiva/sclera: Conjunctivae normal.     Pupils: Pupils are equal,  round, and reactive to light.  Cardiovascular:     Rate and Rhythm: Normal rate and regular rhythm.     Pulses: Normal pulses.     Heart sounds: Normal heart sounds.  Pulmonary:     Effort: Pulmonary effort is normal.     Breath sounds: Normal breath sounds.  Musculoskeletal:        General: Normal range of motion.     Cervical back: Normal range of motion and neck supple.  Neurological:     General: No focal deficit present.     Mental Status: He is alert and oriented to person, place, and time.  Psychiatric:        Mood and Affect: Mood normal.        Behavior: Behavior normal.     ASSESSMENT AND PLAN:  1. Hospital discharge follow-up (Primary) - Reviewed hospital course, current medications, ensured proper follow-up in place, and addressed concerns.   2. AKI (acute kidney injury) 3. Stage 3 chronic kidney disease, unspecified whether stage 3a or 3b CKD (HCC) - Continue present management.  - Referral to Nephrology for evaluation/management. - Ambulatory referral to Nephrology  4. Chronic systolic heart failure (HCC) 5. History of CAD (coronary artery disease) 6. Paroxysmal atrial fibrillation (HCC) - Continue present management.  - Keep all scheduled appointments with established Cardiology.   7. Type 2 diabetes mellitus with hyperglycemia, without long-term current use of insulin  (HCC) - Hemoglobin A1c result pending.  - Continue present management.  - Routine screening.  - Follow-up with primary provider as scheduled.  - Microalbumin / creatinine urine ratio - POCT glycosylated hemoglobin (Hb A1C); Future  8. Diabetic eye exam [facility]) - Referral to Ophthalmology for evaluation/management.  - Ambulatory referral to Ophthalmology  9. Encounter for diabetic foot exam [facility]) - Referral to Podiatry for evaluation/management.  - Ambulatory referral to Podiatry  10. Language barrier - [place] in-person interpreter, Jackolyn   Patient was given the opportunity to  ask questions.  Patient verbalized understanding of the plan and was able to repeat key elements of the plan. Patient was given clear instructions to go to Emergency Department or return to medical center if symptoms don't improve, worsen, or new problems develop.The patient verbalized understanding.   Orders Placed This Encounter  Procedures   Microalbumin / creatinine urine ratio   Ambulatory referral to Ophthalmology   Ambulatory referral to Podiatry   Ambulatory referral to Nephrology   POCT glycosylated hemoglobin (Hb A1C)    Return for Follow-Up or next available with Rosaline Bohr, NP.  Greig JINNY Chute, NP      [1]  Current Outpatient Medications on File Prior to Visit  Medication Sig Dispense Refill   atorvastatin  (LIPITOR ) 80 MG tablet Take 1 tablet (80 mg total) by mouth daily. 90 tablet 3   dapagliflozin  propanediol (FARXIGA ) 10 MG TABS tablet Take 1 tablet (10 mg total) by mouth daily before breakfast. 90 tablet 3   furosemide  (LASIX ) 40 MG tablet Take 1 tablet (40 mg total) by mouth 2 (two) times daily. Tomar una tableta (40 mg) dos veces al dia.     metoprolol  succinate (TOPROL -XL) 25 MG 24 hr tablet Take 1 tablet (25 mg total) by mouth daily. 90 tablet 3   rivaroxaban  (XARELTO ) 20 MG TABS tablet Take 1 tablet (20 mg total) by mouth daily wtih food. 90 tablet 3   No current facility-administered medications on file prior to visit.  [2] No Known Allergies

## 2024-09-28 NOTE — Progress Notes (Signed)
Emergency room follow up

## 2024-09-29 ENCOUNTER — Ambulatory Visit: Payer: Self-pay | Admitting: Family

## 2024-10-02 ENCOUNTER — Telehealth: Payer: Self-pay | Admitting: Pharmacy Technician

## 2024-10-02 ENCOUNTER — Ambulatory Visit (HOSPITAL_COMMUNITY): Payer: Self-pay

## 2024-10-02 ENCOUNTER — Encounter: Payer: Self-pay | Admitting: *Deleted

## 2024-10-02 ENCOUNTER — Telehealth: Payer: Self-pay | Admitting: *Deleted

## 2024-10-02 LAB — MICROALBUMIN / CREATININE URINE RATIO
Creatinine, Urine: 49.3 mg/dL
Microalb/Creat Ratio: 100 mg/g{creat} — ABNORMAL HIGH (ref 0–29)
Microalbumin, Urine: 49.3 ug/mL

## 2024-10-02 NOTE — Progress Notes (Signed)
 I left a message for the patient to return my call.  [id number]

## 2024-10-03 ENCOUNTER — Encounter: Payer: Self-pay | Admitting: *Deleted

## 2024-10-03 ENCOUNTER — Emergency Department (HOSPITAL_COMMUNITY): Payer: Self-pay

## 2024-10-03 ENCOUNTER — Emergency Department (HOSPITAL_COMMUNITY)
Admission: EM | Admit: 2024-10-03 | Discharge: 2024-10-04 | Disposition: A | Discharge status: Home / Self Care | Payer: Self-pay | Attending: Emergency Medicine | Admitting: Emergency Medicine

## 2024-10-03 ENCOUNTER — Ambulatory Visit: Payer: Self-pay

## 2024-10-03 ENCOUNTER — Encounter (HOSPITAL_COMMUNITY): Payer: Self-pay

## 2024-10-03 DIAGNOSIS — E119 Type 2 diabetes mellitus without complications: Secondary | ICD-10-CM | POA: Insufficient documentation

## 2024-10-03 DIAGNOSIS — Z7901 Long term (current) use of anticoagulants: Secondary | ICD-10-CM | POA: Insufficient documentation

## 2024-10-03 DIAGNOSIS — Z79899 Other long term (current) drug therapy: Secondary | ICD-10-CM | POA: Insufficient documentation

## 2024-10-03 DIAGNOSIS — R790 Abnormal level of blood mineral: Secondary | ICD-10-CM | POA: Insufficient documentation

## 2024-10-03 DIAGNOSIS — R600 Localized edema: Secondary | ICD-10-CM | POA: Insufficient documentation

## 2024-10-03 DIAGNOSIS — I2510 Atherosclerotic heart disease of native coronary artery without angina pectoris: Secondary | ICD-10-CM | POA: Insufficient documentation

## 2024-10-03 DIAGNOSIS — I5023 Acute on chronic systolic (congestive) heart failure: Secondary | ICD-10-CM | POA: Insufficient documentation

## 2024-10-03 DIAGNOSIS — I480 Paroxysmal atrial fibrillation: Secondary | ICD-10-CM | POA: Insufficient documentation

## 2024-10-03 DIAGNOSIS — R7401 Elevation of levels of liver transaminase levels: Secondary | ICD-10-CM | POA: Insufficient documentation

## 2024-10-03 LAB — CBC
HCT: 37.1 % — ABNORMAL LOW (ref 39.0–52.0)
Hemoglobin: 11.7 g/dL — ABNORMAL LOW (ref 13.0–17.0)
MCH: 28.7 pg (ref 26.0–34.0)
MCHC: 31.5 g/dL (ref 30.0–36.0)
MCV: 90.9 fL (ref 80.0–100.0)
Platelets: 271 K/uL (ref 150–400)
RBC: 4.08 MIL/uL — ABNORMAL LOW (ref 4.22–5.81)
RDW: 13.9 % (ref 11.5–15.5)
WBC: 10 K/uL (ref 4.0–10.5)
nRBC: 0 % (ref 0.0–0.2)

## 2024-10-03 LAB — BASIC METABOLIC PANEL WITH GFR
Anion gap: 11 (ref 5–15)
BUN: 42 mg/dL — ABNORMAL HIGH (ref 8–23)
CO2: 25 mmol/L (ref 22–32)
Calcium: 9.3 mg/dL (ref 8.9–10.3)
Chloride: 102 mmol/L (ref 98–111)
Creatinine, Ser: 1.86 mg/dL — ABNORMAL HIGH (ref 0.61–1.24)
GFR, Estimated: 39 mL/min — ABNORMAL LOW (ref >60)
Glucose, Bld: 132 mg/dL — ABNORMAL HIGH (ref 70–99)
Potassium: 4.4 mmol/L (ref 3.5–5.1)
Sodium: 139 mmol/L (ref 135–145)

## 2024-10-03 LAB — PRO BRAIN NATRIURETIC PEPTIDE: Pro Brain Natriuretic Peptide: 8285 pg/mL — ABNORMAL HIGH (ref <300.0)

## 2024-10-03 NOTE — Telephone Encounter (Signed)
 FYI Only or Action Required?: Action required by provider: update on patient condition and refusing ER at this time, will go if symptoms recur.SABRAFYI lab follow up appointment scheduled 10/31/23  Patient was last seen in primary care on 09/28/2024 by Jaycee Greig PARAS, NP.  Called Nurse Triage reporting Appointment and Breathing Problem.  Symptoms began a week ago.  Interventions attempted: Rest, hydration, or home remedies.  Symptoms are: unchanged.  Triage Disposition: Go to ED Now (Notify PCP)  Patient/caregiver understands and will follow disposition?:    Copied from CRM #[id number]. Topic: Clinical - Red Word Triage >> Oct 03, 2024  2:45 PM Tobias L wrote: Reason for CRM: Relayed results to patient. Was going to schedule follow up appointment for patient as requested by provider. Patient mentioned having burning sensation in chest, difficulty breathing  Interpreter ID: Jorie [phone]  Call dropped. Called patient back - left message to call back.   Please reach out to patient with interpreter. Reason for Disposition  [1] Chest pain (or angina) comes and goes AND [2] is happening more often (increasing in frequency) or getting worse (increasing in severity)  (Exception: Chest pains that last only a few seconds.)  Answer Assessment - Initial Assessment Questions Additional info: Called to receive lab results and schedule follow up, mentioned symptoms, this writer triaged patient advised ED for acute symptoms, patient declines ER at this time, he plans on seeing if his symptoms recur tonight and if so he will call ambulance.  Lab follow up scheduled on 10/31/23   1. LOCATION: Where does it hurt?       At night develops chest pain 2. RADIATION: Does the pain go anywhere else? (e.g., into neck, jaw, arms, back)     Associated shortness of breath 3. ONSET: When did the chest pain begin? (Minutes, hours or days)      When in bed for one week 4. PATTERN: Does the pain come and go, or  has it been constant since it started?  Does it get worse with exertion?      Intermittent  5. DURATION: How long does it last (e.g., seconds, minutes, hours)     Doesn't last long 6. SEVERITY: How bad is the pain?  (e.g., Scale 1-10; mild, moderate, or severe)     burning 7. CARDIAC RISK FACTORS: Do you have any history of heart problems or risk factors for heart disease? (e.g., angina, prior heart attack; diabetes, high blood pressure, high cholesterol, smoker, or strong family history of heart disease)     multiple 8. PULMONARY RISK FACTORS: Do you have any history of lung disease?  (e.g., blood clots in lung, asthma, emphysema, birth control pills)      9. CAUSE: What do you think is causing the chest pain?     Not identified 10. OTHER SYMPTOMS: Do you have any other symptoms? (e.g., dizziness, nausea, vomiting, sweating, fever, difficulty breathing, cough)       Burning in chest, shortness of breath 11. PREGNANCY: Is there any chance you are pregnant? When was your last menstrual period?  Protocols used: Chest Pain-A-AH

## 2024-10-03 NOTE — ED Provider Triage Note (Signed)
 Emergency Medicine Provider Triage Evaluation Note  Pedro Kemp , a 68 y.o. male  was evaluated in triage.  Pt complains of shortness of breath.  Reports that symptoms been ongoing for several days and are worse on exertion, denies cough or fever.  No chest pain to report.  No other complaints at this time.  Review of Systems  Positive: As above Negative: As above  Physical Exam  BP 115/61 (BP Location: Left Arm)   Pulse 64   Temp 97.7 F (36.5 C)   Resp 16   SpO2 97%  Gen:   Awake, no distress   Resp:  Normal effort  MSK:   Moves extremities without difficulty  Other:    Medical Decision Making  Medically screening exam initiated at 9:42 PM.  Appropriate orders placed.  Kelvis [name] was informed that the remainder of the evaluation will be completed by another provider, this initial triage assessment does not replace that evaluation, and the importance of remaining in the ED until their evaluation is complete.     Glendia Rocky SAILOR, [place] 10/03/24 2143

## 2024-10-03 NOTE — ED Triage Notes (Addendum)
 [place] spanish interpretor used to assist with triage. Pt reports SHOB that started yesterday, reports worse with exertion. Pt denies pain, hx chf

## 2024-10-03 NOTE — Telephone Encounter (Signed)
 noted

## 2024-10-04 ENCOUNTER — Other Ambulatory Visit: Payer: Self-pay

## 2024-10-04 LAB — CBC WITH DIFFERENTIAL/PLATELET
Abs Immature Granulocytes: 0.03 K/uL (ref 0.00–0.07)
Basophils Absolute: 0.1 K/uL (ref 0.0–0.1)
Basophils Relative: 1 %
Eosinophils Absolute: 0.5 K/uL (ref 0.0–0.5)
Eosinophils Relative: 5 %
HCT: 39.7 % (ref 39.0–52.0)
Hemoglobin: 12.6 g/dL — ABNORMAL LOW (ref 13.0–17.0)
Immature Granulocytes: 0 %
Lymphocytes Relative: 16 %
Lymphs Abs: 1.5 K/uL (ref 0.7–4.0)
MCH: 28.7 pg (ref 26.0–34.0)
MCHC: 31.7 g/dL (ref 30.0–36.0)
MCV: 90.4 fL (ref 80.0–100.0)
Monocytes Absolute: 0.5 K/uL (ref 0.1–1.0)
Monocytes Relative: 5 %
Neutro Abs: 6.4 K/uL (ref 1.7–7.7)
Neutrophils Relative %: 73 %
Platelets: 275 K/uL (ref 150–400)
RBC: 4.39 MIL/uL (ref 4.22–5.81)
RDW: 14.2 % (ref 11.5–15.5)
WBC: 8.9 K/uL (ref 4.0–10.5)
nRBC: 0 % (ref 0.0–0.2)

## 2024-10-04 LAB — COMPREHENSIVE METABOLIC PANEL WITH GFR
ALT: 65 U/L — ABNORMAL HIGH (ref 0–44)
AST: 53 U/L — ABNORMAL HIGH (ref 15–41)
Albumin: 4 g/dL (ref 3.5–5.0)
Alkaline Phosphatase: 133 U/L — ABNORMAL HIGH (ref 38–126)
Anion gap: 15 (ref 5–15)
BUN: 39 mg/dL — ABNORMAL HIGH (ref 8–23)
CO2: 21 mmol/L — ABNORMAL LOW (ref 22–32)
Calcium: 9.6 mg/dL (ref 8.9–10.3)
Chloride: 104 mmol/L (ref 98–111)
Creatinine, Ser: 1.57 mg/dL — ABNORMAL HIGH (ref 0.61–1.24)
GFR, Estimated: 48 mL/min — ABNORMAL LOW (ref >60)
Glucose, Bld: 80 mg/dL (ref 70–99)
Potassium: 4.5 mmol/L (ref 3.5–5.1)
Sodium: 141 mmol/L (ref 135–145)
Total Bilirubin: 0.8 mg/dL (ref 0.0–1.2)
Total Protein: 7.1 g/dL (ref 6.5–8.1)

## 2024-10-04 LAB — RESP PANEL BY RT-PCR (RSV, FLU A&B, COVID)  RVPGX2
Influenza A by PCR: NEGATIVE
Influenza B by PCR: NEGATIVE
Resp Syncytial Virus by PCR: NEGATIVE
SARS Coronavirus 2 by RT PCR: NEGATIVE

## 2024-10-04 LAB — URINALYSIS, ROUTINE W REFLEX MICROSCOPIC
Bacteria, UA: NONE SEEN
Bilirubin Urine: NEGATIVE
Glucose, UA: >=500 mg/dL — AB
Hgb urine dipstick: NEGATIVE
Ketones, ur: NEGATIVE mg/dL
Leukocytes,Ua: NEGATIVE
Nitrite: NEGATIVE
Protein, ur: NEGATIVE mg/dL
Specific Gravity, Urine: 1.009 (ref 1.005–1.030)
pH: 5 (ref 5.0–8.0)

## 2024-10-04 MED ORDER — FUROSEMIDE 20 MG PO TABS
40.0000 mg | ORAL_TABLET | Freq: Once | ORAL | Status: AC
  Administered 2024-10-04: 13:00:00 40 mg via ORAL
  Filled 2024-10-04: qty 2

## 2024-10-04 NOTE — Telephone Encounter (Signed)
 Pt is currently in the hospital.

## 2024-10-04 NOTE — Discharge Instructions (Signed)
 Gracias por venir al [facility] [place]. Fue atendido por dificultad para respirar que se [place]. Le realizamos un examen, anlisis de laboratorio e imgenes, que probablemente mostraron una exacerbacin leve de insuficiencia cardaca. Contine tomando Lasix  40 mg dos veces al da segn lo que fue prescrito originalmente. Debe suspender el Losartn segn las indicaciones. Sin embargo, segn las instrucciones de su hospitalizacin ms reciente, le indicaron que continuara tomando Lasix  40 mg consolidated edison. Consulte con su mdico de cabecera dentro de una semana para ignacia reevaluacin y un control de los niveles renales.  No dude en regresar al [facility] o llamar al 911 si experimenta: - Empeoramiento de los sntomas - Empeoramiento de la dificultad para respirar al hacer ejercicio o al [employer] - Dolor en el pecho - Mareos, desmayos - Fiebre/escalofros - Cualquier otra cosa que le preocupe   Thank you for coming to [facility]. You were seen for shortness of breath that resolved. We did an exam, labs, and imaging, and these showed likely a mild heart failure exacerbation. Please continue to take your lasix  40 mg twice per day as originally prescribed. You should discontinue the losartan  as instructed. But based on the discharge instructions from your most recent hospitalization, they wanted you to continue taking the lasix  40 mg twice per day. Please follow up with your primary care provider within 1 week for reevaluation and kidney level check.   Do not hesitate to return to the ED or call 911 if you experience: -Worsening symptoms -Worsening shortness of breath with exertion or with lying down -Chest pain -Lightheadedness, passing out -Fevers/chills -Anything else that concerns you

## 2024-10-04 NOTE — ED Provider Notes (Incomplete)
 [facility] Provider Note   CSN: [id number] Arrival date & time: 10/03/24  2113     History {Add pertinent medical, surgical, social history, OB history to HPI:1} Chief Complaint  Patient presents with   Shortness of Breath    Pedro Kemp is a 68 y.o. male with PMH as listed below who presents with  shortness of breath.  Reports that symptoms been ongoing for several days and are worse on exertion, denies cough or fever.  No chest pain to report.  No other complaints at this time.     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       Home Medications Prior to Admission medications  Medication Sig Start Date End Date Taking? Authorizing Provider  atorvastatin  (LIPITOR ) 80 MG tablet Take 1 tablet (80 mg total) by mouth daily. 05/05/24   Croitoru, Mihai, MD  dapagliflozin  propanediol (FARXIGA ) 10 MG TABS tablet Take 1 tablet (10 mg total) by mouth daily before breakfast. 05/05/24   Croitoru, Mihai, MD  furosemide  (LASIX ) 40 MG tablet Take 1 tablet (40 mg total) by mouth 2 (two) times daily. Tomar una tableta (40 mg) dos veces al dia. 09/25/24   Fairy Frames, MD  metoprolol  succinate (TOPROL -XL) 25 MG 24 hr tablet Take 1 tablet (25 mg total) by mouth daily. 05/05/24   Croitoru, Mihai, MD  rivaroxaban  (XARELTO ) 20 MG TABS tablet Take 1 tablet (20 mg total) by mouth daily wtih food. 05/05/24   Croitoru, Jerel, MD      Allergies    Patient has no known allergies.    Review of Systems   Review of Systems A 10 point review of systems was performed and is negative unless otherwise reported in HPI.  Physical Exam Updated Vital Signs BP (!) 119/52 (BP Location: Left Arm)   Pulse 60   Temp 97.8 F  (36.6 C)   Resp 19   SpO2 99%  Physical Exam General: Normal appearing {Desc; male/male:11659}, lying in bed.  HEENT: PERRLA, Sclera anicteric, MMM, trachea midline.  Cardiology: RRR, no murmurs/rubs/gallops. BL radial and DP pulses equal bilaterally.  Resp: Normal respiratory rate and effort. CTAB, no wheezes, rhonchi, crackles.  Abd: Soft, non-tender, non-distended. No rebound tenderness or guarding.  GU: Deferred. MSK: No peripheral edema or signs of trauma. Extremities without deformity or TTP. No cyanosis or clubbing. Skin: warm, dry. No rashes or lesions. Back: No CVA tenderness Neuro: A&Ox4, CNs II-XII grossly intact. MAEs. Sensation grossly intact.  Psych: Normal mood and affect.   ED Results / Procedures / Treatments   Labs (all labs ordered are listed, but only abnormal results are displayed) Labs Reviewed  BASIC METABOLIC PANEL WITH GFR - Abnormal; Notable for the following components:      Result Value   Glucose, Bld 132 (*)    BUN 42 (*)    Creatinine, Ser 1.86 (*)    GFR, Estimated 39 (*)    All other components within normal limits  CBC - Abnormal; Notable for the following components:   RBC 4.08 (*)    Hemoglobin 11.7 (*)    HCT 37.1 (*)    All other components within normal limits  PRO BRAIN  NATRIURETIC PEPTIDE - Abnormal; Notable for the following components:   Pro Brain Natriuretic Peptide 8,285.0 (*)    All other components within normal limits  RESP PANEL BY RT-PCR (RSV, FLU A&B, COVID)  RVPGX2    EKG EKG Interpretation Date/Time:  Tuesday October 03 2024 21:43:42 EST Ventricular Rate:  64 PR Interval:  182 QRS Duration:  132 QT Interval:  426 QTC Calculation: 439 R Axis:   26  Text Interpretation: Sinus rhythm Left bundle branch block Negative sgarbossa's criteria Confirmed by Jerrol Agent ([id number]) on 10/03/2024 9:46:00 PM  Radiology DG Chest 2 View Result Date: 10/03/2024 EXAM: 2 VIEW(S) XRAY OF THE CHEST 10/03/2024 09:55:00 PM  COMPARISON: 09/23/2024 CLINICAL HISTORY: shortness of breath FINDINGS: LINES, TUBES AND DEVICES: Stable left chest dual lead cardiac AICD (Automatic Implantable Cardioverter-Defibrillator). LUNGS AND PLEURA: Diffuse increased interstitial markings. Trace pleural effusions. No pneumothorax. HEART AND MEDIASTINUM: Stable cardiomediastinal silhouette. Aortic arch calcifications. BONES AND SOFT TISSUES: Mild thoracic spondylosis. IMPRESSION: 1. Diffuse increased interstitial markings and trace pleural effusions. Electronically signed by: Morgane Naveau MD 10/03/2024 10:02 PM EST RP Workstation: [id number]    Procedures Procedures  {Document cardiac monitor, telemetry assessment procedure when appropriate:1}  Medications Ordered in ED Medications - No data to display  ED Course/ Medical Decision Making/ A&P                          Medical Decision Making   This patient presents to the ED for concern of ***, this involves an extensive number of treatment options, and is a complaint that carries with it a high risk of complications and morbidity.  I considered the following differential and admission for this acute, potentially life threatening condition.   MDM:    ***     Labs: I Ordered, and personally interpreted labs.  The pertinent results include:  ***  Imaging Studies ordered: I ordered imaging studies including *** I independently visualized and interpreted imaging. I agree with the radiologist interpretation  Additional history obtained from ***.  External records from outside source obtained and reviewed including ***  Cardiac Monitoring: The patient was maintained on a cardiac monitor.  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     Medicines No orders of the defined types were placed in this encounter.   I have reviewed the patients home medicines and have made adjustments as needed  Problem List / ED Course: Problem List Items Addressed This Visit   None        {Document critical care time when appropriate:1} {Document review of labs and clinical decision tools ie heart score, Chads2Vasc2 etc:1}  {Document your independent review of radiology images, and any outside records:1} {Document your discussion with family members, caretakers, and with consultants:1} {Document social determinants of health affecting pt's care:1} {Document your decision making why or why not admission, treatments were needed:1}  This note  was created using dictation software, which may contain spelling or grammatical errors.

## 2024-10-04 NOTE — ED Notes (Signed)
 Patient ambulated with pulse ox. He walked about 73ft, O2 sats stayed 100% the entire time and his pulse went from 75 bmp to 85 bmp.

## 2024-10-04 NOTE — ED Notes (Signed)
 Patient discharged by RN. Patient in wheelchair to lobby to be picked up by sister. Spanish interpreter used for dc.

## 2024-10-06 ENCOUNTER — Other Ambulatory Visit: Payer: Self-pay

## 2024-10-06 ENCOUNTER — Other Ambulatory Visit (HOSPITAL_COMMUNITY): Payer: Self-pay | Admitting: Cardiovascular Disease

## 2024-10-06 ENCOUNTER — Other Ambulatory Visit: Payer: Self-pay | Admitting: Cardiovascular Disease

## 2024-10-06 ENCOUNTER — Encounter: Payer: Self-pay | Admitting: *Deleted

## 2024-10-06 ENCOUNTER — Other Ambulatory Visit (INDEPENDENT_AMBULATORY_CARE_PROVIDER_SITE_OTHER): Payer: Self-pay | Admitting: Primary Care

## 2024-10-06 DIAGNOSIS — Z760 Encounter for issue of repeat prescription: Secondary | ICD-10-CM

## 2024-10-06 DIAGNOSIS — E119 Type 2 diabetes mellitus without complications: Secondary | ICD-10-CM

## 2024-10-06 DIAGNOSIS — Z23 Encounter for immunization: Secondary | ICD-10-CM

## 2024-10-07 ENCOUNTER — Emergency Department (HOSPITAL_COMMUNITY): Payer: Self-pay

## 2024-10-07 ENCOUNTER — Inpatient Hospital Stay (HOSPITAL_COMMUNITY)
Admission: EM | Admit: 2024-10-07 | Discharge: 2024-10-09 | DRG: 291 | Disposition: A | Discharge status: Home / Self Care | Payer: MEDICAID | Attending: Internal Medicine | Admitting: Internal Medicine

## 2024-10-07 ENCOUNTER — Other Ambulatory Visit: Payer: Self-pay

## 2024-10-07 ENCOUNTER — Encounter (HOSPITAL_COMMUNITY): Payer: Self-pay | Admitting: *Deleted

## 2024-10-07 DIAGNOSIS — I255 Ischemic cardiomyopathy: Secondary | ICD-10-CM | POA: Diagnosis present

## 2024-10-07 DIAGNOSIS — D631 Anemia in chronic kidney disease: Secondary | ICD-10-CM | POA: Diagnosis present

## 2024-10-07 DIAGNOSIS — R748 Abnormal levels of other serum enzymes: Secondary | ICD-10-CM | POA: Diagnosis present

## 2024-10-07 DIAGNOSIS — I480 Paroxysmal atrial fibrillation: Secondary | ICD-10-CM | POA: Diagnosis present

## 2024-10-07 DIAGNOSIS — I2510 Atherosclerotic heart disease of native coronary artery without angina pectoris: Secondary | ICD-10-CM | POA: Diagnosis present

## 2024-10-07 DIAGNOSIS — Z603 Acculturation difficulty: Secondary | ICD-10-CM | POA: Diagnosis present

## 2024-10-07 DIAGNOSIS — I509 Heart failure, unspecified: Principal | ICD-10-CM

## 2024-10-07 DIAGNOSIS — I252 Old myocardial infarction: Secondary | ICD-10-CM

## 2024-10-07 DIAGNOSIS — Z794 Long term (current) use of insulin: Secondary | ICD-10-CM

## 2024-10-07 DIAGNOSIS — E1122 Type 2 diabetes mellitus with diabetic chronic kidney disease: Secondary | ICD-10-CM | POA: Diagnosis present

## 2024-10-07 DIAGNOSIS — R1310 Dysphagia, unspecified: Secondary | ICD-10-CM | POA: Diagnosis present

## 2024-10-07 DIAGNOSIS — R7989 Other specified abnormal findings of blood chemistry: Secondary | ICD-10-CM | POA: Diagnosis present

## 2024-10-07 DIAGNOSIS — D649 Anemia, unspecified: Secondary | ICD-10-CM | POA: Diagnosis present

## 2024-10-07 DIAGNOSIS — N189 Chronic kidney disease, unspecified: Secondary | ICD-10-CM | POA: Diagnosis present

## 2024-10-07 DIAGNOSIS — Z79899 Other long term (current) drug therapy: Secondary | ICD-10-CM

## 2024-10-07 DIAGNOSIS — Z955 Presence of coronary angioplasty implant and graft: Secondary | ICD-10-CM

## 2024-10-07 DIAGNOSIS — I5023 Acute on chronic systolic (congestive) heart failure: Secondary | ICD-10-CM | POA: Diagnosis present

## 2024-10-07 DIAGNOSIS — Z7984 Long term (current) use of oral hypoglycemic drugs: Secondary | ICD-10-CM

## 2024-10-07 DIAGNOSIS — Z833 Family history of diabetes mellitus: Secondary | ICD-10-CM

## 2024-10-07 DIAGNOSIS — I130 Hypertensive heart and chronic kidney disease with heart failure and stage 1 through stage 4 chronic kidney disease, or unspecified chronic kidney disease: Principal | ICD-10-CM | POA: Diagnosis present

## 2024-10-07 DIAGNOSIS — Z8674 Personal history of sudden cardiac arrest: Secondary | ICD-10-CM

## 2024-10-07 DIAGNOSIS — K5900 Constipation, unspecified: Secondary | ICD-10-CM | POA: Diagnosis present

## 2024-10-07 DIAGNOSIS — I5020 Unspecified systolic (congestive) heart failure: Secondary | ICD-10-CM | POA: Diagnosis present

## 2024-10-07 DIAGNOSIS — N1832 Chronic kidney disease, stage 3b: Secondary | ICD-10-CM | POA: Diagnosis present

## 2024-10-07 DIAGNOSIS — N179 Acute kidney failure, unspecified: Secondary | ICD-10-CM | POA: Diagnosis present

## 2024-10-07 DIAGNOSIS — Z95810 Presence of automatic (implantable) cardiac defibrillator: Secondary | ICD-10-CM

## 2024-10-07 DIAGNOSIS — K219 Gastro-esophageal reflux disease without esophagitis: Secondary | ICD-10-CM | POA: Diagnosis present

## 2024-10-07 DIAGNOSIS — Z7901 Long term (current) use of anticoagulants: Secondary | ICD-10-CM

## 2024-10-07 DIAGNOSIS — D72829 Elevated white blood cell count, unspecified: Secondary | ICD-10-CM | POA: Diagnosis present

## 2024-10-07 DIAGNOSIS — E1165 Type 2 diabetes mellitus with hyperglycemia: Secondary | ICD-10-CM | POA: Diagnosis present

## 2024-10-07 LAB — HEPATIC FUNCTION PANEL
ALT: 73 U/L — ABNORMAL HIGH (ref 0–44)
AST: 50 U/L — ABNORMAL HIGH (ref 15–41)
Albumin: 3.9 g/dL (ref 3.5–5.0)
Alkaline Phosphatase: 143 U/L — ABNORMAL HIGH (ref 38–126)
Bilirubin, Direct: 0.2 mg/dL (ref 0.0–0.2)
Indirect Bilirubin: 0.3 mg/dL (ref 0.3–0.9)
Total Bilirubin: 0.5 mg/dL (ref 0.0–1.2)
Total Protein: 6.7 g/dL (ref 6.5–8.1)

## 2024-10-07 LAB — BASIC METABOLIC PANEL WITH GFR
Anion gap: 13 (ref 5–15)
BUN: 50 mg/dL — ABNORMAL HIGH (ref 8–23)
CO2: 25 mmol/L (ref 22–32)
Calcium: 9.1 mg/dL (ref 8.9–10.3)
Chloride: 105 mmol/L (ref 98–111)
Creatinine, Ser: 2.09 mg/dL — ABNORMAL HIGH (ref 0.61–1.24)
GFR, Estimated: 34 mL/min — ABNORMAL LOW
Glucose, Bld: 199 mg/dL — ABNORMAL HIGH (ref 70–99)
Potassium: 3.6 mmol/L (ref 3.5–5.1)
Sodium: 143 mmol/L (ref 135–145)

## 2024-10-07 LAB — D-DIMER, QUANTITATIVE: D-Dimer, Quant: 1.04 ug{FEU}/mL — ABNORMAL HIGH (ref 0.00–0.50)

## 2024-10-07 LAB — CBC
HCT: 39.9 % (ref 39.0–52.0)
Hemoglobin: 12.7 g/dL — ABNORMAL LOW (ref 13.0–17.0)
MCH: 29.3 pg (ref 26.0–34.0)
MCHC: 31.8 g/dL (ref 30.0–36.0)
MCV: 92.1 fL (ref 80.0–100.0)
Platelets: 236 K/uL (ref 150–400)
RBC: 4.33 MIL/uL (ref 4.22–5.81)
RDW: 14.4 % (ref 11.5–15.5)
WBC: 13.7 K/uL — ABNORMAL HIGH (ref 4.0–10.5)
nRBC: 0 % (ref 0.0–0.2)

## 2024-10-07 LAB — TROPONIN T, HIGH SENSITIVITY
Troponin T High Sensitivity: 50 ng/L — ABNORMAL HIGH (ref 0–19)
Troponin T High Sensitivity: 72 ng/L — ABNORMAL HIGH (ref 0–19)

## 2024-10-07 LAB — GLUCOSE, CAPILLARY: Glucose-Capillary: 106 mg/dL — ABNORMAL HIGH (ref 70–99)

## 2024-10-07 LAB — CBG MONITORING, ED: Glucose-Capillary: 160 mg/dL — ABNORMAL HIGH (ref 70–99)

## 2024-10-07 LAB — PRO BRAIN NATRIURETIC PEPTIDE: Pro Brain Natriuretic Peptide: 8192 pg/mL — ABNORMAL HIGH

## 2024-10-07 MED ORDER — SODIUM CHLORIDE 0.9 % IV SOLN: 250.0000 mL | INTRAVENOUS | Status: AC | PRN

## 2024-10-07 MED ORDER — INSULIN ASPART 100 UNIT/ML IJ SOLN
0.0000 [IU] | Freq: Three times a day (TID) | INTRAMUSCULAR | Status: DC
  Administered 2024-10-08: 2 [IU] via SUBCUTANEOUS
  Administered 2024-10-08 (×2): 1 [IU] via SUBCUTANEOUS
  Administered 2024-10-09: 2 [IU] via SUBCUTANEOUS
  Filled 2024-10-07 (×2): qty 1
  Filled 2024-10-07: qty 2
  Filled 2024-10-07 (×2): qty 1

## 2024-10-07 MED ORDER — RIVAROXABAN 10 MG PO TABS: 20.0000 mg | ORAL_TABLET | Freq: Every day | ORAL | Status: DC

## 2024-10-07 MED ORDER — DOCUSATE SODIUM 100 MG PO CAPS
100.0000 mg | ORAL_CAPSULE | Freq: Two times a day (BID) | ORAL | Status: DC
  Administered 2024-10-07 – 2024-10-09 (×4): 100 mg via ORAL
  Filled 2024-10-07 (×4): qty 1

## 2024-10-07 MED ORDER — ONDANSETRON HCL 4 MG/2ML IJ SOLN: 4.0000 mg | Freq: Four times a day (QID) | INTRAMUSCULAR | Status: DC | PRN

## 2024-10-07 MED ORDER — SODIUM CHLORIDE 0.9% FLUSH: 3.0000 mL | INTRAVENOUS | Status: AC | PRN

## 2024-10-07 MED ORDER — ACETAMINOPHEN 325 MG PO TABS: 650.0000 mg | ORAL_TABLET | ORAL | Status: DC | PRN

## 2024-10-07 MED ORDER — INSULIN ASPART 100 UNIT/ML IJ SOLN
0.0000 [IU] | Freq: Three times a day (TID) | INTRAMUSCULAR | Status: DC
  Administered 2024-10-07: 3 [IU] via SUBCUTANEOUS
  Filled 2024-10-07: qty 3

## 2024-10-07 MED ORDER — SODIUM CHLORIDE 0.9% FLUSH
3.0000 mL | Freq: Two times a day (BID) | INTRAVENOUS | Status: DC
  Administered 2024-10-07 – 2024-10-09 (×5): 3 mL via INTRAVENOUS

## 2024-10-07 MED ORDER — RIVAROXABAN 15 MG PO TABS
15.0000 mg | ORAL_TABLET | Freq: Every day | ORAL | Status: DC
  Administered 2024-10-07 – 2024-10-09 (×3): 15 mg via ORAL
  Filled 2024-10-07 (×3): qty 1

## 2024-10-07 MED ORDER — FUROSEMIDE 40 MG PO TABS
40.0000 mg | ORAL_TABLET | Freq: Two times a day (BID) | ORAL | 0 refills | Status: DC
  Filled 2024-10-07: qty 90, 45d supply, fill #0

## 2024-10-07 MED ORDER — FUROSEMIDE 10 MG/ML IJ SOLN
40.0000 mg | Freq: Two times a day (BID) | INTRAMUSCULAR | Status: DC
  Administered 2024-10-07 – 2024-10-09 (×4): 40 mg via INTRAVENOUS
  Filled 2024-10-07 (×4): qty 4

## 2024-10-07 MED ORDER — ATORVASTATIN CALCIUM 80 MG PO TABS
80.0000 mg | ORAL_TABLET | Freq: Every day | ORAL | Status: DC
  Administered 2024-10-07 – 2024-10-09 (×3): 80 mg via ORAL
  Filled 2024-10-07 (×3): qty 1

## 2024-10-07 MED ORDER — INSULIN ASPART 100 UNIT/ML IJ SOLN: 0.0000 [IU] | Freq: Every day | INTRAMUSCULAR | Status: DC

## 2024-10-07 MED ORDER — POLYETHYLENE GLYCOL 3350 17 G PO PACK
17.0000 g | PACK | Freq: Two times a day (BID) | ORAL | Status: DC | PRN
  Administered 2024-10-07: 17 g via ORAL
  Filled 2024-10-07: qty 1

## 2024-10-07 MED ORDER — PANTOPRAZOLE SODIUM 40 MG PO TBEC
40.0000 mg | DELAYED_RELEASE_TABLET | Freq: Every day | ORAL | Status: DC
  Administered 2024-10-07 – 2024-10-09 (×3): 40 mg via ORAL
  Filled 2024-10-07 (×3): qty 1

## 2024-10-07 MED ORDER — METOPROLOL SUCCINATE ER 25 MG PO TB24
25.0000 mg | ORAL_TABLET | Freq: Every day | ORAL | Status: DC
  Administered 2024-10-07 – 2024-10-09 (×2): 25 mg via ORAL
  Filled 2024-10-07 (×3): qty 1

## 2024-10-07 MED ORDER — FUROSEMIDE 10 MG/ML IJ SOLN
40.0000 mg | INTRAMUSCULAR | Status: AC
  Administered 2024-10-07: 40 mg via INTRAVENOUS
  Filled 2024-10-07: qty 4

## 2024-10-07 NOTE — ED Triage Notes (Signed)
 The pt reports that he does not have any medical problems by  interpreter

## 2024-10-07 NOTE — H&P (Signed)
 " History and Physical    Patient: Pedro Kemp FMW:[id number] DOB: [birth year] DOA: 10/07/2024 DOS: the patient was seen and examined on 10/07/2024 PCP: Celestia Rosaline SQUIBB, NP  Patient coming from: Home  Chief Complaint:  Chief Complaint  Patient presents with   Shortness of Breath   HPI: Pedro Kemp is a 68 y.o. male with medical history significant of ischemic cardiomyopathy, CAD, heart failure with reduced ejection fraction, history of VT arrest s/p ICD, paroxysmal atrial fibrillation, and diabetes mellitus type 2 presents with shortness of breath, particularly at night. He is accompanied by his brother.  History is obtained with the use of Spanish interpreter services  The shortness of breath has been ongoing since last night and occurs intermittently, worsening when lying down. He was seen at the hospital four days ago, but no changes were made to his medications. He has access to his medications at home, which include treatments for diabetes and hypertension, and is taking Xarelto . He is almost out of his diuretic medication and has requested a refill from the pharmacy, which has not yet been provided.  No leg swelling is noted, but there is pain in the right leg.  He experiences a burning sensation when breathing or swallowing food. He does not consume alcohol or other caffeinated beverages and does not currently take any medication for acid reflux or indigestion.  In the emergency department patient was noted to be afebrile with fairly stable vital signs.  Labs significant for proBNP 8192, high-sensitivity troponin 50->72, WBC 13.7, BUN 15, creatinine 2.05, alkaline phosphatase 143, AST 50, and ALT 73.  Chest x-ray noted no acute cardiopulmonary finding with low lung volumes and chronic coarsening interstitial markings with probable trace pleural effusion and left AICD in place.  Patient was given Lasix  40 mg IV x 1 dose.  Review of Systems: As mentioned in the history of  present illness. All other systems reviewed and are negative. 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   Past Surgical History:  Procedure Laterality Date   CARDIAC CATHETERIZATION     ICD IMPLANT     RIGHT/LEFT HEART CATH AND CORONARY ANGIOGRAPHY N/A 08/24/2023   Procedure: RIGHT/LEFT HEART CATH AND CORONARY ANGIOGRAPHY;  Surgeon: Wendel Lurena POUR, MD;  Location: [facility] INVASIVE CV LAB;  Service: Cardiovascular;  Laterality: N/A;   Social History:  reports that he has never smoked. He has never been exposed to tobacco smoke. He has never used smokeless tobacco. He reports that he does not drink alcohol and does not use drugs.  Allergies[1]  Family History  Problem Relation Age of Onset   Diabetes Mother    Diabetes Brother     Prior to Admission medications  Medication Sig Start Date End Date Taking? Authorizing Provider  atorvastatin  (LIPITOR ) 80 MG tablet Take 1 tablet (80 mg total) by mouth daily. 05/05/24   Croitoru, Mihai, MD  dapagliflozin  propanediol (FARXIGA ) 10 MG TABS tablet Take 1 tablet (10 mg total) by mouth daily before breakfast. 05/05/24   Croitoru, Mihai, MD  furosemide  (LASIX ) 40 MG tablet Take 1 tablet (40 mg total) by mouth 2 (two) times daily. Tomar una tableta (40 mg) dos veces al dia. 09/25/24   Fairy Frames, MD  metoprolol  succinate (TOPROL -XL) 25 MG 24 hr tablet Take 1 tablet (25 mg total) by mouth daily. 05/05/24   Croitoru, Mihai, MD  rivaroxaban  (XARELTO ) 20 MG TABS tablet Take 1 tablet (20 mg total) by mouth daily wtih food. 05/05/24   Croitoru, Jerel, MD    Physical Exam: Vitals:   10/07/24 0722 10/07/24 0800 10/07/24 0815 10/07/24 0845  BP: 112/66 116/85 117/74 123/75  Pulse: (!) 59 79 65 66  Resp: 18 18 18 17   Temp:  98.4 F (36.9 C)     TempSrc: Oral     SpO2: 96% 99% 97% 100%  Weight:      Height:        Constitutional: Elderly male NAD, calm, comfortable Eyes: PERRL, lids and conjunctivae normal ENMT: Mucous membranes are moist.  Poor dentition  Neck: normal, supple.  JVD present. Respiratory: clear to auscultation bilaterally, no wheezing, no crackles. Normal respiratory effort. No accessory muscle use.  Cardiovascular: Regular rate and rhythm, no murmurs / rubs / gallops.  No significant lower extremity edema present. Abdomen: no tenderness, no masses palpated. Bowel sounds positive.  Musculoskeletal: no clubbing / cyanosis. No joint deformity upper and lower extremities. Good ROM, no contractures. Normal muscle tone.  Skin: no rashes, lesions, ulcers. No induration Neurologic: CN 2-12 grossly intact. Strength 5/5 in all 4.  Psychiatric: Normal judgment and insight. Alert and oriented x 3. Normal mood.   Data Reviewed:  EKG reveals sinus rhythm with fusion complexes at 77 bpm.  Reviewed labs, imaging, and pertinent records as documented.  Assessment and Plan:  Heart failure with reduced ejection fraction Acute on chronic.  Patient presents with complaints of shortness of breath.  proBNP 8192 which appears similar to when seen on.  Last echocardiogram from 07/2024 noted EF of 25 to 30% with grade 2 diastolic dysfunction pseudonormalization and right ventricular systolic function was noted to be low normal.  Patient was started on Lasix  40 mg IV. - Admit to a telemetry bed - Heart failure order set utilized - Strict I&O's and daily weights - Check D-dimer - Continue Lasix  40 mg IV twice daily.  Reassess and adjust diuresis as deemed medically appropriate - Continue metoprolol   - Consider consult to cardiology in a.m. if further assistance with further management   CAD Elevated troponin Chronic.  Patient with multiple PCI and stents.  Last cardiac cath in 2024 did not reveal a clear  culprit lesion for which medical treatment was recommended..  High-sensitivity troponin 50-> 72, but appears similar to priors.  Thought secondary to demand. - Continue to monitor  Leukocytosis Acute.  WBC elevated at 13.7.  Possibly reactive in nature.  Patient denies any significant infectious symptoms at this time. - Recheck CBC tomorrow morning.  Acute kidney injury superimposed on chronic kidney disease stage IIIb Creatinine noted to be 2.09 with BUN 50 baseline creatinine noted to be around 1.56-1.86.  Possibly related with hypoperfusion in the setting of CHF exacerbation. - Avoid possible nephrotoxic agents - Continue to monitor kidney function with diuresis  Paroxysmal atrial fibrillation Patient appears to be in a sinus rhythm at this time.  Reports compliance with Xarelto  although prescription fill dates do not appear to match up. - Continue Xarelto  and metoprolol   Uncontrolled diabetes mellitus type 2, without long-term use of insulin . On admission glucose noted to be 199.  Last hemoglobin A1c was 8.4. - Hypoglycemic protocols - CBGs before every meal with sliding scale of insulin  - Adjust insulin  regimen as deemed medically appropriate  Normocytic anemia Chronic.  Hemoglobin noted to be 12.7 which appears around patient's baseline. - Recheck CBC tomorrow morning  Elevated liver enzymes Thought possibly secondary to hepatic congestion. - Continue to monitor  GERD Patient reports having burning discomfort in the center of his chest and having difficulty swallowing certain foods. - Discussed dietary risk factors - Start Protonix  - Advised patient of the need to follow-up with gastroenterology if symptoms persist for need of further evaluation  DVT prophylaxis: Lovenox  Advance Care Planning:   Code Status: Full Code   Consults: None  Family Communication: Brother updated at bedside  Severity of Illness: The appropriate patient status for this patient is INPATIENT.  Inpatient status is judged to be reasonable and necessary in order to provide the required intensity of service to ensure the patient's safety. The patient's presenting symptoms, physical exam findings, and initial radiographic and laboratory data in the context of their chronic comorbidities is felt to place them at high risk for further clinical deterioration. Furthermore, it is not anticipated that the patient will be medically stable for discharge from the hospital within 2 midnights of admission.   * I certify that at the point of admission it is my clinical judgment that the patient will require inpatient hospital care spanning beyond 2 midnights from the point of admission due to high intensity of service, high risk for further deterioration and high frequency of surveillance required.*  Author: Maximino DELENA Sharps, MD 10/07/2024 9:22 AM  For on call review [email or web address].      [1] No Known Allergies  "

## 2024-10-07 NOTE — Progress Notes (Signed)
 Patient arrived from [facility] to 50E03. Telemetry placed. Vitals and weight obtained. Admission questionnaire and assessment completed with use of an interpreter.

## 2024-10-07 NOTE — ED Triage Notes (Signed)
 The pt reports sob and some chest pain today  he denies any  chronic breathing problem he  is using his accessory muscles to breathe he speaks only spanish

## 2024-10-07 NOTE — ED Notes (Signed)
 Pt ambulated from WR to 15 with steady gait. Placed on SpO2 pt's sats at 100%

## 2024-10-07 NOTE — ED Provider Notes (Signed)
 " [facility] Provider Note   CSN: [id number] Arrival date & time: 10/07/24  9757     Patient presents with: Shortness of Breath   Pedro Kemp is a 68 y.o. male with PMHx DM, CHF, HLD, HTN, CAD, PAF, CKD who presents ED concern for central chest pain since yesterday. Also with SOB. Denies cough, fever, nausea, vomiting, diarrhea. Patient endorses complaince on medications. Patient presented to this ED a couple of days ago with similar symptoms and eventually discharged - symptoms are worse today.   Spanish translator Izaiah [phone] assisting with interview.    Shortness of Breath      Prior to Admission medications  Medication Sig Start Date End Date Taking? Authorizing Provider  atorvastatin  (LIPITOR ) 80 MG tablet Take 1 tablet (80 mg total) by mouth daily. 05/05/24   Croitoru, Mihai, MD  dapagliflozin  propanediol (FARXIGA ) 10 MG TABS tablet Take 1 tablet (10 mg total) by mouth daily before breakfast. 05/05/24   Croitoru, Mihai, MD  furosemide  (LASIX ) 40 MG tablet Take 1 tablet (40 mg total) by mouth 2 (two) times daily. Tomar una tableta (40 mg) dos veces al dia. En caso de aumento the [place] 2 libras en un dia, o 5 libras en [place], tomar ona tableta y media, dos veces al dia, hasta que el peso vuelva a la normalidad. 10/07/24   Croitoru, Mihai, MD  metoprolol  succinate (TOPROL -XL) 25 MG 24 hr tablet Take 1 tablet (25 mg total) by mouth daily. 05/05/24   Croitoru, Mihai, MD  rivaroxaban  (XARELTO ) 20 MG TABS tablet Take 1 tablet (20 mg total) by mouth daily wtih food. 05/05/24   Croitoru, Jerel, MD    Allergies: Patient has no known allergies.    Review of Systems  Respiratory:  Positive for shortness of breath.     Updated Vital Signs BP 122/89   Pulse 61   Temp 98.4 F (36.9 C) (Oral)   Resp 19   Ht 5' 3 (1.6 m)   Wt 59 kg   SpO2 100%   BMI 23.03 kg/m   Physical Exam Vitals and nursing note reviewed.   Constitutional:      General: He is not in acute distress.    Appearance: He is not ill-appearing or toxic-appearing.  HENT:     Head: Normocephalic and atraumatic.     Mouth/Throat:     Mouth: Mucous membranes are moist.     Pharynx: No posterior oropharyngeal erythema.  Eyes:     General: No scleral icterus.       Right eye: No discharge.        Left eye: No discharge.     Conjunctiva/sclera: Conjunctivae normal.  Cardiovascular:     Rate and Rhythm: Normal rate and regular rhythm.     Pulses: Normal pulses.     Heart sounds: No murmur heard. Pulmonary:     Effort: Pulmonary effort is normal. No respiratory distress.     Breath sounds: Normal breath sounds. No wheezing, rhonchi or rales.  Abdominal:     General: Bowel sounds are normal.     Palpations: Abdomen is soft.     Tenderness: There is no abdominal tenderness.  Musculoskeletal:     Right lower leg: No edema.     Left lower leg: No edema.  Skin:    General: Skin is warm and dry.     Findings: No rash.  Neurological:     General: No focal deficit present.  Mental Status: He is alert. Mental status is at baseline.  Psychiatric:        Mood and Affect: Mood normal.     (all labs ordered are listed, but only abnormal results are displayed) Labs Reviewed  BASIC METABOLIC PANEL WITH GFR - Abnormal; Notable for the following components:      Result Value   Glucose, Bld 199 (*)    BUN 50 (*)    Creatinine, Ser 2.09 (*)    GFR, Estimated 34 (*)    All other components within normal limits  CBC - Abnormal; Notable for the following components:   WBC 13.7 (*)    Hemoglobin 12.7 (*)    All other components within normal limits  HEPATIC FUNCTION PANEL - Abnormal; Notable for the following components:   AST 50 (*)    ALT 73 (*)    Alkaline Phosphatase 143 (*)    All other components within normal limits  PRO BRAIN NATRIURETIC PEPTIDE - Abnormal; Notable for the following components:   Pro Brain Natriuretic  Peptide 8,192.0 (*)    All other components within normal limits  TROPONIN T, HIGH SENSITIVITY - Abnormal; Notable for the following components:   Troponin T High Sensitivity 50 (*)    All other components within normal limits  TROPONIN T, HIGH SENSITIVITY - Abnormal; Notable for the following components:   Troponin T High Sensitivity 72 (*)    All other components within normal limits    EKG: None  Radiology: DG Chest 2 View Result Date: 10/07/2024 EXAM: 2 VIEW(S) XRAY OF THE CHEST 10/07/2024 04:24:00 AM COMPARISON: 10/03/2024 CLINICAL HISTORY: chest pain sob today FINDINGS: LUNGS AND PLEURA: Low lung volumes. Chronic coarsened interstitial markings with increased interstitial markings. Probable trace pleural effusions. No focal pulmonary opacity. No pneumothorax. HEART AND MEDIASTINUM: Left chest AICD in place. Aortic calcification. BONES AND SOFT TISSUES: No acute osseous abnormality. IMPRESSION: 1. No acute cardiopulmonary findings. 2. Low lung volumes with chronic coarsened interstitial markings. 3. Probable trace pleural effusions. Left chest AICD in place. Aortic atherosclerotic calcification. Electronically signed by: Evalene Coho MD 10/07/2024 04:59 AM EST RP Workstation: [id number]     Procedures   Medications Ordered in the ED  furosemide  (LASIX ) injection 40 mg (40 mg Intravenous Given 10/07/24 0903)                                    Medical Decision Making Risk Prescription drug management. Decision regarding hospitalization.   This patient presents to the ED for concern of shortness of breath, this involves an extensive number of treatment options, and is a complaint that carries with it a high risk of complications and morbidity.  The differential diagnosis includes Anxiety, Anaphylaxis/Angioedema, Aspirated FB, Arrhythmia, CHF, Asthma, COPD, PNA, COVID/Flu/RSV, STEMI, Tamponade, TPNX, Sepsis   Co morbidities that complicate the patient evaluation  See HPI  above   Additional history obtained:  07/2024 ECHO: 25-30 EF   Problem List / ED Course / Critical interventions / Medication management  Patient presents ED concern for central chest pain and SOB worsening over the past 24 hours.  Physical exam reassuring.  Patient afebrile with stable vitals.  Patient was able to ambulate on pulse ox with oxygen staying near 100% on RA. I Ordered, and personally interpreted labs.  CBC with leukocytosis at 13.7.  There is also mild anemia with hemoglobin at 12.7.  BMP with AKI with BUN/creatinine  elevated at 50/2.09.  Liver enzymes are also mildly elevated with AST/ALT/ALP at 50/73/143.  Initial troponin 50 elevating to 72.  BNP elevated at 8192. I ordered imaging studies including chest xray to assess for process contributing to patient's symptoms. I independently visualized and interpreted imaging which showed trace pleural effusions. I agree with the radiologist interpretation. I most concern for CHF exacerbation.  I shared all results with patient.  Answered all questions.  Recommended inpatient treatment.  Patient agreeable with plan. I requested consultation with the hospitalist on-call Dr. Claudene,  and discussed lab and imaging findings as well as pertinent plan - they agreed to admit patient Staffed with Dr. Darra. I have reviewed the patients home medicines and have made adjustments as needed   Social Determinants of Health:  Foreign language       Final diagnoses:  Acute on chronic congestive heart failure, unspecified heart failure type [facility])    ED Discharge Orders     None          Hoy Nidia FALCON, [place] 10/07/24 0932  "

## 2024-10-07 NOTE — Progress Notes (Signed)
 [facility] night cross cover note:   I was notified by the patient's RN that the patient is reporting constipation, conveying that his most recent bowel movement occurred 1 week ago.  I subsequently ordered scheduled Colace as well as as needed MiraLAX , and requested the first dose of MiraLAX  be given now.     Eva Pore, DO Hospitalist

## 2024-10-07 NOTE — ED Provider Triage Note (Signed)
 Emergency Medicine Provider Triage Evaluation Note  Pedro Kemp , a 68 y.o. male  was evaluated in triage.  Pt complains of  SOB times today with some lower extremity swelling, history of NSTEMI's and CHF.  No one in his home is unwell.  Review of Systems  Positive: As above, epigastric pain Negative: Chest pain  Physical Exam  BP 127/85 (BP Location: Right Arm)   Pulse 80   Temp 98.1 F (36.7 C) (Oral)   Resp 14   Ht 5' 3 (1.6 m)   Wt 59 kg   SpO2 94%   BMI 23.03 kg/m  Gen:   Awake, no distress   Resp:  Normal effort  MSK:   Moves extremities without difficulty  Other:  Rales in the bases bilaterally on and off pulmonary auscultation, normal cardiac exam.  1+ lower extremity pitting edema bilaterally.  Medical Decision Making  Medically screening exam initiated at 4:09 AM.  Appropriate orders placed.  Pedro Kemp was informed that the remainder of the evaluation will be completed by another provider, this initial triage assessment does not replace that evaluation, and the importance of remaining in the ED until their evaluation is complete.  This chart was dictated using voice recognition software, Dragon. Despite the best efforts of this provider to proofread and correct errors, errors may still occur which can change documentation meaning.    Pedro Pleasant SAUNDERS, PA-C 10/07/24 0410

## 2024-10-07 NOTE — ED Notes (Signed)
 This nurse called 3E charge and gave report. Charge stated they are ready for patient to be brought up.

## 2024-10-08 ENCOUNTER — Inpatient Hospital Stay (HOSPITAL_COMMUNITY): Payer: Self-pay

## 2024-10-08 DIAGNOSIS — M79661 Pain in right lower leg: Secondary | ICD-10-CM

## 2024-10-08 LAB — GLUCOSE, CAPILLARY
Glucose-Capillary: 125 mg/dL — ABNORMAL HIGH (ref 70–99)
Glucose-Capillary: 164 mg/dL — ABNORMAL HIGH (ref 70–99)
Glucose-Capillary: 133 mg/dL — ABNORMAL HIGH (ref 70–99)
Glucose-Capillary: 181 mg/dL — ABNORMAL HIGH (ref 70–99)

## 2024-10-08 LAB — BASIC METABOLIC PANEL WITH GFR
Anion gap: 12 (ref 5–15)
BUN: 49 mg/dL — ABNORMAL HIGH (ref 8–23)
CO2: 27 mmol/L (ref 22–32)
Calcium: 9.1 mg/dL (ref 8.9–10.3)
Chloride: 103 mmol/L (ref 98–111)
Creatinine, Ser: 1.83 mg/dL — ABNORMAL HIGH (ref 0.61–1.24)
GFR, Estimated: 40 mL/min — ABNORMAL LOW
Glucose, Bld: 117 mg/dL — ABNORMAL HIGH (ref 70–99)
Potassium: 3.9 mmol/L (ref 3.5–5.1)
Sodium: 142 mmol/L (ref 135–145)

## 2024-10-08 MED ORDER — DAPAGLIFLOZIN PROPANEDIOL 10 MG PO TABS
10.0000 mg | ORAL_TABLET | Freq: Every day | ORAL | Status: DC
  Administered 2024-10-09: 10 mg via ORAL
  Filled 2024-10-08: qty 1

## 2024-10-08 MED ORDER — ORAL CARE MOUTH RINSE: 15.0000 mL | OROMUCOSAL | Status: DC | PRN

## 2024-10-08 MED ORDER — LOSARTAN POTASSIUM 25 MG PO TABS
25.0000 mg | ORAL_TABLET | Freq: Every day | ORAL | Status: DC
  Administered 2024-10-09: 25 mg via ORAL
  Filled 2024-10-08: qty 1

## 2024-10-08 MED ORDER — OXYCODONE HCL 5 MG PO TABS
5.0000 mg | ORAL_TABLET | Freq: Once | ORAL | Status: AC
  Administered 2024-10-08: 5 mg via ORAL
  Filled 2024-10-08: qty 1

## 2024-10-08 NOTE — Progress Notes (Addendum)
 " PROGRESS NOTE    Pedro Kemp  FMW:[id number] DOB: 02/12/1956 DOA: 10/07/2024 PCP: Celestia Rosaline SQUIBB, NP   Brief Narrative:   68 y.o. male with medical history significant of ischemic cardiomyopathy, CAD, heart failure with reduced ejection fraction, history of VT arrest s/p ICD, paroxysmal atrial fibrillation, and diabetes mellitus type 2 presents with shortness of breath.  Being treated for acute on chronic CHF.  Assessment & Plan:  Principal Problem:   HFrEF (heart failure with reduced ejection fraction) (HCC) Active Problems:   Elevated troponin   Leukocytosis   Acute kidney injury superimposed on chronic kidney disease   Paroxysmal atrial fibrillation (HCC)   Uncontrolled type 2 diabetes mellitus with hyperglycemia, with long-term current use of insulin  (HCC)   Normocytic anemia   Elevated liver enzymes   GERD (gastroesophageal reflux disease)   Heart failure with reduced ejection fraction, acute on chronic,POA: NYHA II  proBNP 8192 which appears similar to when seen on.   Last echocardiogram from 07/2024 noted EF of 25 to 30% with grade 2 diastolic dysfunction pseudonormalization and right ventricular systolic function was noted to be low normal.   -Continue with Lasix  40 mg IV BID. - Strict I&O's and daily weights - Continue metoprolol  succinate 25 mg daily -AICD is in place -Ordered farxiga  and losartan  to be resumed from tomorrow.   CAD status post DES to LAD/diagonal  Elevated troponin Chronic.  Patient with multiple PCI and stents.  Last cardiac cath in 2024 did not reveal a clear culprit lesion for which medical treatment was recommended..  High-sensitivity troponin 50-> 72, but appears similar to priors.  Thought secondary to demand. - Continue to monitor on tele. -continue with atorvastatin  and metoprolol . Aspirin  was stopped by his cardiologist as an outpatient.   Leukocytosis WBC elevated at 13.7.  Possibly reactive in nature.  Patient denies any  significant infectious symptoms at this time. - Ordered CBC to be checked in am.   Acute kidney injury superimposed on chronic kidney disease stage IIIb, resolved, likely cardiorenal Creatinine noted to be 2.09 with BUN 50 baseline creatinine noted to be around 1.56-1.86.  - Avoid possible nephrotoxic agents - Continue to monitor kidney function with diuresis   Paroxysmal atrial fibrillation - Continue Xarelto  and metoprolol    Uncontrolled diabetes mellitus type 2, without long-term use of insulin .  Last hemoglobin A1c was 8.4. - Hypoglycemic protocols - CBGs before every meal with sliding scale of insulin  - Adjust insulin  regimen as deemed medically appropriate    Normocytic anemia Chronic.     Elevated liver enzymes Thought possibly secondary to hepatic congestion. - Continue to monitor   GERD Patient reports having burning discomfort in the center of his chest and having difficulty swallowing certain foods. - Discussed dietary risk factors - Started Protonix  - Advised patient of the need to follow-up with gastroenterology if symptoms persist for need of further evaluation   Disposition: Home   DVT prophylaxis:  Rivaroxaban  (XARELTO ) tablet 15 mg     Code Status: Full Code Family Communication:  Family at the bedside Status is: Inpatient Remains inpatient appropriate because: Acute CHF    Subjective:  Denies shortness of breath and chest pain. We talked about continuing IV lasix  and likely transitioning to oral lasix  tomorrow. LE Venous duplex is pending.  Examination:  General exam: Appears calm and comfortable  Respiratory system: Clear to auscultation. Respiratory effort normal. Cardiovascular system: S1 & S2 heard, RRR. No JVD, murmurs, rubs, gallops or clicks. No pedal edema. Gastrointestinal system:  Abdomen is nondistended, soft and nontender. No organomegaly or masses felt. Normal bowel sounds heard. Central nervous system: Alert and oriented. No focal  neurological deficits. Extremities: Symmetric 5 x 5 power. Skin: No rashes, lesions or ulcers Psychiatry: Judgement and insight appear normal. Mood & affect appropriate.    Diet Orders (From admission, onward)     Start     Ordered   10/07/24 1002  Diet heart healthy/carb modified Room service appropriate? Yes; Fluid consistency: Thin  Diet effective now       Question Answer Comment  Diet-HS Snack? Nothing   Room service appropriate? Yes   Fluid consistency: Thin      10/07/24 1009            Objective: Vitals:   10/07/24 2346 10/08/24 0409 10/08/24 0410 10/08/24 0708  BP: (!) 105/56 (!) 109/59  (!) 100/57  Pulse: 64   62  Resp: 17 18  20   Temp: 98.6 F (37 C) 98.7 F (37.1 C)  98.7 F (37.1 C)  TempSrc: Oral Oral  Oral  SpO2: 95% 98%  96%  Weight:   61.9 kg   Height:        Intake/Output Summary (Last 24 hours) at 10/08/2024 1137 Last data filed at 10/08/2024 0411 Gross per 24 hour  Intake --  Output 1600 ml  Net -1600 ml   Filed Weights   10/07/24 0308 10/07/24 1943 10/08/24 0410  Weight: 59 kg 63.1 kg 61.9 kg    Scheduled Meds:  atorvastatin   80 mg Oral Daily   docusate sodium   100 mg Oral BID   furosemide   40 mg Intravenous BID   insulin  aspart  0-5 Units Subcutaneous QHS   insulin  aspart  0-9 Units Subcutaneous TID WC   metoprolol  succinate  25 mg Oral Daily   pantoprazole   40 mg Oral Daily   rivaroxaban   15 mg Oral Daily   sodium chloride  flush  3 mL Intravenous Q12H   Continuous Infusions:  Nutritional status     Body mass index is 24.17 kg/m.  Data Reviewed:   CBC: Recent Labs  Lab 10/03/24 2142 10/04/24 1251 10/07/24 0319  WBC 10.0 8.9 13.7*  NEUTROABS  --  6.4  --   HGB 11.7* 12.6* 12.7*  HCT 37.1* 39.7 39.9  MCV 90.9 90.4 92.1  PLT 271 275 236   Basic Metabolic Panel: Recent Labs  Lab 10/03/24 2142 10/04/24 1251 10/07/24 0319 10/08/24 0331  NA 139 141 143 142  K 4.4 4.5 3.6 3.9  CL 102 104 105 103  CO2 25 21*  25 27  GLUCOSE 132* 80 199* 117*  BUN 42* 39* 50* 49*  CREATININE 1.86* 1.57* 2.09* 1.83*  CALCIUM  9.3 9.6 9.1 9.1   GFR: Estimated Creatinine Clearance: 31.5 mL/min (A) (by C-G formula based on SCr of 1.83 mg/dL (H)). Liver Function Tests: Recent Labs  Lab 10/04/24 1251 10/07/24 0319  AST 53* 50*  ALT 65* 73*  ALKPHOS 133* 143*  BILITOT 0.8 0.5  PROT 7.1 6.7  ALBUMIN 4.0 3.9   No results for input(s): LIPASE, AMYLASE in the last 168 hours. No results for input(s): AMMONIA in the last 168 hours. Coagulation Profile: No results for input(s): INR, PROTIME in the last 168 hours. Cardiac Enzymes: No results for input(s): CKTOTAL, CKMB, CKMBINDEX, TROPONINI in the last 168 hours. BNP (last 3 results) Recent Labs    10/03/24 2142 10/07/24 0545  PROBNP 8,285.0* 8,192.0*   HbA1C: No results for input(s): HGBA1C in  the last 72 hours. CBG: Recent Labs  Lab 10/07/24 1630 10/07/24 2128 10/08/24 0623  GLUCAP 160* 106* 125*   Lipid Profile: No results for input(s): CHOL, HDL, LDLCALC, TRIG, CHOLHDL, LDLDIRECT in the last 72 hours. Thyroid  Function Tests: No results for input(s): TSH, T4TOTAL, FREET4, T3FREE, THYROIDAB in the last 72 hours. Anemia Panel: No results for input(s): VITAMINB12, FOLATE, FERRITIN, TIBC, IRON, RETICCTPCT in the last 72 hours. Sepsis Labs: No results for input(s): PROCALCITON, LATICACIDVEN in the last 168 hours.  Recent Results (from the past 240 hours)  Resp panel by RT-PCR (RSV, Flu A&B, Covid) Anterior Nasal Swab     Status: None   Collection Time: 10/04/24 12:45 PM   Specimen: Anterior Nasal Swab  Result Value Ref Range Status   SARS Coronavirus 2 by RT PCR NEGATIVE NEGATIVE Final   Influenza A by PCR NEGATIVE NEGATIVE Final   Influenza B by PCR NEGATIVE NEGATIVE Final    Comment: (NOTE) The Xpert Xpress SARS-CoV-2/FLU/RSV plus assay is intended as an aid in the diagnosis of  influenza from Nasopharyngeal swab specimens and should not be used as a sole basis for treatment. Nasal washings and aspirates are unacceptable for Xpert Xpress SARS-CoV-2/FLU/RSV testing.  Fact Sheet for Patients: bloggercourse.com  Fact Sheet for Healthcare Providers: seriousbroker.it  This test is not yet approved or cleared by the [place]  FDA and has been authorized for detection and/or diagnosis of SARS-CoV-2 by FDA under an Emergency Use Authorization (EUA). This EUA will remain in effect (meaning this test can be used) for the duration of the COVID-19 declaration under Section 564(b)(1) of the Act, 21 U.S.C. section 360bbb-3(b)(1), unless the authorization is terminated or revoked.     Resp Syncytial Virus by PCR NEGATIVE NEGATIVE Final    Comment: (NOTE) Fact Sheet for Patients: bloggercourse.com  Fact Sheet for Healthcare Providers: seriousbroker.it  This test is not yet approved or cleared by the [place]  FDA and has been authorized for detection and/or diagnosis of SARS-CoV-2 by FDA under an Emergency Use Authorization (EUA). This EUA will remain in effect (meaning this test can be used) for the duration of the COVID-19 declaration under Section 564(b)(1) of the Act, 21 U.S.C. section 360bbb-3(b)(1), unless the authorization is terminated or revoked.  Performed at [facility], [address] [place], [place] [address]          Radiology Studies: DG Chest 2 View Result Date: 10/07/2024 EXAM: 2 VIEW(S) XRAY OF THE CHEST 10/07/2024 04:24:00 AM COMPARISON: 10/03/2024 CLINICAL HISTORY: chest pain sob today FINDINGS: LUNGS AND PLEURA: Low lung volumes. Chronic coarsened interstitial markings with increased interstitial markings. Probable trace pleural effusions. No focal pulmonary opacity. No pneumothorax. HEART AND MEDIASTINUM: Left chest AICD in  place. Aortic calcification. BONES AND SOFT TISSUES: No acute osseous abnormality. IMPRESSION: 1. No acute cardiopulmonary findings. 2. Low lung volumes with chronic coarsened interstitial markings. 3. Probable trace pleural effusions. Left chest AICD in place. Aortic atherosclerotic calcification. Electronically signed by: Evalene Coho MD 10/07/2024 04:59 AM EST RP Workstation: [id number]           LOS: 1 day   Time spent= 35 mins    Deliliah Room, MD [facility]  If 7PM-7AM, please contact night-coverage  10/08/2024, 11:37 AM  "

## 2024-10-08 NOTE — Progress Notes (Signed)
 Bilateral lower extremity venous duplex has been completed.  Results can be found in chart review under CV Proc.  10/08/2024 3:58 PM  Edilia Elden Appl, RVT.

## 2024-10-09 ENCOUNTER — Other Ambulatory Visit (HOSPITAL_COMMUNITY): Payer: Self-pay

## 2024-10-09 ENCOUNTER — Telehealth: Payer: Self-pay | Admitting: Pharmacist

## 2024-10-09 ENCOUNTER — Other Ambulatory Visit: Payer: Self-pay

## 2024-10-09 LAB — BASIC METABOLIC PANEL WITH GFR
Anion gap: 12 (ref 5–15)
BUN: 49 mg/dL — ABNORMAL HIGH (ref 8–23)
CO2: 28 mmol/L (ref 22–32)
Calcium: 8.9 mg/dL (ref 8.9–10.3)
Chloride: 102 mmol/L (ref 98–111)
Creatinine, Ser: 1.73 mg/dL — ABNORMAL HIGH (ref 0.61–1.24)
GFR, Estimated: 43 mL/min — ABNORMAL LOW
Glucose, Bld: 125 mg/dL — ABNORMAL HIGH (ref 70–99)
Potassium: 3.7 mmol/L (ref 3.5–5.1)
Sodium: 141 mmol/L (ref 135–145)

## 2024-10-09 LAB — CBC WITH DIFFERENTIAL/PLATELET
Abs Immature Granulocytes: 0.03 K/uL (ref 0.00–0.07)
Basophils Absolute: 0.1 K/uL (ref 0.0–0.1)
Basophils Relative: 1 %
Eosinophils Absolute: 0.8 K/uL — ABNORMAL HIGH (ref 0.0–0.5)
Eosinophils Relative: 11 %
HCT: 38.2 % — ABNORMAL LOW (ref 39.0–52.0)
Hemoglobin: 12.8 g/dL — ABNORMAL LOW (ref 13.0–17.0)
Immature Granulocytes: 0 %
Lymphocytes Relative: 20 %
Lymphs Abs: 1.5 K/uL (ref 0.7–4.0)
MCH: 29.4 pg (ref 26.0–34.0)
MCHC: 33.5 g/dL (ref 30.0–36.0)
MCV: 87.8 fL (ref 80.0–100.0)
Monocytes Absolute: 0.4 K/uL (ref 0.1–1.0)
Monocytes Relative: 6 %
Neutro Abs: 4.9 K/uL (ref 1.7–7.7)
Neutrophils Relative %: 62 %
Platelets: 188 K/uL (ref 150–400)
RBC: 4.35 MIL/uL (ref 4.22–5.81)
RDW: 14.5 % (ref 11.5–15.5)
WBC: 7.8 K/uL (ref 4.0–10.5)
nRBC: 0 % (ref 0.0–0.2)

## 2024-10-09 LAB — GLUCOSE, CAPILLARY
Glucose-Capillary: 117 mg/dL — ABNORMAL HIGH (ref 70–99)
Glucose-Capillary: 164 mg/dL — ABNORMAL HIGH (ref 70–99)

## 2024-10-09 MED ORDER — DAPAGLIFLOZIN PROPANEDIOL 10 MG PO TABS
10.0000 mg | ORAL_TABLET | Freq: Every day | ORAL | 0 refills | Status: AC
  Filled 2024-10-09: qty 30, 30d supply, fill #0

## 2024-10-09 MED ORDER — FUROSEMIDE 40 MG PO TABS
40.0000 mg | ORAL_TABLET | Freq: Two times a day (BID) | ORAL | 1 refills | Status: DC
  Filled 2024-10-09 – 2024-11-03 (×2): qty 60, 30d supply, fill #0
  Filled 2024-11-03: qty 60, 30d supply, fill #1

## 2024-10-09 MED ORDER — PANTOPRAZOLE SODIUM 40 MG PO TBEC
40.0000 mg | DELAYED_RELEASE_TABLET | Freq: Every day | ORAL | 0 refills | Status: AC
  Filled 2024-10-09: qty 10, 10d supply, fill #0

## 2024-10-09 MED ORDER — RIVAROXABAN 15 MG PO TABS: 15.0000 mg | ORAL_TABLET | Freq: Every day | ORAL | 0 refills | Status: DC

## 2024-10-09 MED ORDER — RIVAROXABAN 15 MG PO TABS
15.0000 mg | ORAL_TABLET | Freq: Every day | ORAL | 0 refills | Status: AC
  Filled 2024-10-09: qty 30, 30d supply, fill #0

## 2024-10-09 NOTE — Discharge Summary (Signed)
 " Physician Discharge Summary   Patient: Pedro Kemp MRN: [id number] DOB: 1956/05/03  Admit date:     10/07/2024  Discharge date: 10/09/2024  Discharge Physician: Deliliah Room   PCP: Celestia Rosaline SQUIBB, NP   Recommendations at discharge:    F/u with your PCP in one week F/u with your cardiologist on one month Continue taking meds as prescribed Fluid restriction of 1.5 L per day, daily weight and heart healthy diet  Discharge Diagnoses: Principal Problem:   HFrEF (heart failure with reduced ejection fraction) (HCC) Active Problems:   Elevated troponin   Leukocytosis   Acute kidney injury superimposed on chronic kidney disease   Paroxysmal atrial fibrillation (HCC)   Uncontrolled type 2 diabetes mellitus with hyperglycemia, with long-term current use of insulin  (HCC)   Normocytic anemia   Elevated liver enzymes   GERD (gastroesophageal reflux disease)   Hospital Course:  68 y.o. male with medical history significant of ischemic cardiomyopathy, CAD, heart failure with reduced ejection fraction, history of VT arrest s/p ICD, paroxysmal atrial fibrillation, and diabetes mellitus type 2 presents with shortness of breath.   He was treated for acute on chronic CHF.  Heart failure with reduced ejection fraction, acute on chronic,POA: NYHA II  proBNP 8192 which appears similar to when seen on.   Last echocardiogram from 07/2024 noted EF of 25 to 30% with grade 2 diastolic dysfunction pseudonormalization and right ventricular systolic function was noted to be low normal.   -Received Lasix  40 mg IV BID in the hospital and dced on oral lasix  40 mg PO BID. - Strict I&O's and daily weights - Continue metoprolol  succinate 25 mg daily -AICD is in place -Continue with farxiga  and losartan  -outpatient f/u with cardiology in a month.   CAD status post DES to LAD/diagonal  Elevated troponin Chronic.  Patient with multiple PCI and stents.  Last cardiac cath in 2024 did not reveal a  clear culprit lesion for which medical treatment was recommended..  High-sensitivity troponin 50-> 72, but appears similar to priors.  Thought secondary to demand. -continue with atorvastatin  and metoprolol . Aspirin  was stopped by his cardiologist as an outpatient.   Leukocytosis WBC elevated at 13.7.  Possibly reactive in nature.  Patient denies any significant infectious symptoms at this time. - Resolved   Acute kidney injury superimposed on chronic kidney disease stage IIIb, resolved, likely cardiorenal, resolved Creatinine noted to be 2.09 with BUN 50 baseline creatinine noted to be around 1.56-1.86.  - Renal panel needs to be checked in one week.   Paroxysmal atrial fibrillation - Continue Xarelto  and metoprolol    Uncontrolled diabetes mellitus type 2, without long-term use of insulin .  Last hemoglobin A1c was 8.4.   Normocytic anemia Chronic.     Elevated liver enzymes  possibly secondary to hepatic congestion.   GERD Patient reports having burning discomfort in the center of his chest and having difficulty swallowing certain foods. - Discussed dietary risk factors - Started Protonix  - Advised patient of the need to follow-up with gastroenterology if symptoms persist for need of further evaluation   Disposition: Home      Consultants: None Procedures performed: None  Disposition: Home Diet recommendation:  Cardiac diet DISCHARGE MEDICATION: Allergies as of 10/09/2024   No Known Allergies      Medication List     TAKE these medications    atorvastatin  80 MG tablet Commonly known as: LIPITOR  Tome 1 tableta (80 mg en total) por va oral diariamente. (Take 1 tablet (80 mg total)  by mouth daily.)   dapagliflozin  propanediol 10 MG Tabs tablet Commonly known as: Farxiga  Tome 1 tableta (10 mg en total) por va oral diariamente antes de desayunar. (Take 1 tablet (10 mg total) by mouth daily before breakfast.)   furosemide  40 MG tablet Commonly known as:  LASIX  Take 1 tablet (40 mg total) by mouth 2 (two) times daily. Tomar una tableta (40 mg) dos veces al dia. En caso de aumento the [place] 2 libras en un dia, o 5 libras en [facility], tomar ona tableta y media, dos veces al dia, hasta que el peso vuelva a la normalidad.   losartan  25 MG tablet Commonly known as: COZAAR  Take 25 mg by mouth daily.   metoprolol  succinate 25 MG 24 hr tablet Commonly known as: TOPROL -XL Tome 1 tableta (25 mg en total) por va oral diariamente. (Take 1 tablet (25 mg total) by mouth daily.)   Rivaroxaban  15 MG Tabs tablet Commonly known as: XARELTO  Take 1 tablet (15 mg total) by mouth daily. Start taking on: October 10, 2024 What changed:  medication strength how much to take        Follow-up Information     Celestia Rosaline SQUIBB, NP. Schedule an appointment as soon as possible for a visit in 1 week(s).   Specialty: Internal Medicine Contact information: [address] [place] [address] [phone]         Darryle Thom CROME, PA-C. Schedule an appointment as soon as possible for a visit in 1 month(s).   Specialty: Cardiology Contact information: [address] [place] [address] [phone]                Discharge Exam: Fredricka Weights   10/07/24 1943 10/08/24 0410 10/09/24 0500  Weight: 63.1 kg 61.9 kg 61.4 kg   Constitutional: NAD, calm, comfortable Eyes: PERRL, lids and conjunctivae normal ENMT: Mucous membranes are moist. Posterior pharynx clear of any exudate or lesions.Normal dentition.  Neck: normal, supple, no masses, no thyromegaly Respiratory: clear to auscultation bilaterally, no wheezing, no crackles. Normal respiratory effort. No accessory muscle use.  Cardiovascular: Regular rate and rhythm, no murmurs / rubs / gallops. No extremity edema. 2+ pedal pulses. No carotid bruits.  Abdomen: no tenderness, no masses palpated. No hepatosplenomegaly. Bowel sounds positive.  Musculoskeletal: no clubbing /  cyanosis. No joint deformity upper and lower extremities. Good ROM, no contractures. Normal muscle tone.  Skin: no rashes, lesions, ulcers. No induration Neurologic: CN 2-12 grossly intact. Sensation intact, DTR normal. Strength 5/5 x all 4 extremities.  Psychiatric: Normal judgment and insight. Alert and oriented x 3. Normal mood.    Condition at discharge: good  The results of significant diagnostics from this hospitalization (including imaging, microbiology, ancillary and laboratory) are listed below for reference.   Imaging Studies: VAS US  LOWER EXTREMITY VENOUS (DVT) Result Date: 10/08/2024  Lower Venous DVT Study Patient Name:  HORST OSTERMILLER  Date of Exam:   10/08/2024 Medical Rec #: [id number]          Accession #:    [id number] Date of Birth: February 14, 1956          Patient Gender: M Patient Age:   11 years Exam Location:  [facility] Procedure:      VAS US  LOWER EXTREMITY VENOUS (DVT) Referring Phys: MAXIMINO SHARPS --------------------------------------------------------------------------------  Indications: Pain, heart failure, H/O VT, A-fib, and SOB.  Comparison Study: Previous study on 7.26.2022 Performing Technologist: Edilia Elden Appl  Examination Guidelines: A complete evaluation includes B-mode imaging, spectral  Doppler, color Doppler, and power Doppler as needed of all accessible portions of each vessel. Bilateral testing is considered an integral part of a complete examination. Limited examinations for reoccurring indications may be performed as noted. The reflux portion of the exam is performed with the patient in reverse Trendelenburg.  +---------+---------------+---------+-----------+----------+--------------+ RIGHT    CompressibilityPhasicitySpontaneityPropertiesThrombus Aging +---------+---------------+---------+-----------+----------+--------------+ CFV      Full           Yes      Yes                                  +---------+---------------+---------+-----------+----------+--------------+ SFJ      Full           Yes      Yes                                 +---------+---------------+---------+-----------+----------+--------------+ FV Prox  Full                                                        +---------+---------------+---------+-----------+----------+--------------+ FV Mid   Full                                                        +---------+---------------+---------+-----------+----------+--------------+ FV DistalFull                                                        +---------+---------------+---------+-----------+----------+--------------+ PFV      Full                                                        +---------+---------------+---------+-----------+----------+--------------+ POP      Full           Yes      Yes                                 +---------+---------------+---------+-----------+----------+--------------+ PTV      Full                                                        +---------+---------------+---------+-----------+----------+--------------+ PERO     Full                                                        +---------+---------------+---------+-----------+----------+--------------+   +---------+---------------+---------+-----------+----------+--------------+  LEFT     CompressibilityPhasicitySpontaneityPropertiesThrombus Aging +---------+---------------+---------+-----------+----------+--------------+ CFV      Full           Yes      Yes                                 +---------+---------------+---------+-----------+----------+--------------+ SFJ      Full           Yes      Yes                                 +---------+---------------+---------+-----------+----------+--------------+ FV Prox  Full                                                         +---------+---------------+---------+-----------+----------+--------------+ FV Mid   Full                                                        +---------+---------------+---------+-----------+----------+--------------+ FV DistalFull                                                        +---------+---------------+---------+-----------+----------+--------------+ PFV      Full                                                        +---------+---------------+---------+-----------+----------+--------------+ POP      Full           Yes      Yes                                 +---------+---------------+---------+-----------+----------+--------------+ PTV      Full                                                        +---------+---------------+---------+-----------+----------+--------------+ PERO     Full                                                        +---------+---------------+---------+-----------+----------+--------------+     Summary: BILATERAL: - No evidence of deep vein thrombosis seen in the lower extremities, bilaterally. -No evidence of popliteal cyst, bilaterally.   *See table(s) above for measurements and observations. Electronically signed by Debby Robertson on 10/08/2024 at 8:31:52 PM.    Final    DG Chest  2 View Result Date: 10/07/2024 EXAM: 2 VIEW(S) XRAY OF THE CHEST 10/07/2024 04:24:00 AM COMPARISON: 10/03/2024 CLINICAL HISTORY: chest pain sob today FINDINGS: LUNGS AND PLEURA: Low lung volumes. Chronic coarsened interstitial markings with increased interstitial markings. Probable trace pleural effusions. No focal pulmonary opacity. No pneumothorax. HEART AND MEDIASTINUM: Left chest AICD in place. Aortic calcification. BONES AND SOFT TISSUES: No acute osseous abnormality. IMPRESSION: 1. No acute cardiopulmonary findings. 2. Low lung volumes with chronic coarsened interstitial markings. 3. Probable trace pleural effusions. Left chest AICD in place.  Aortic atherosclerotic calcification. Electronically signed by: Evalene Coho MD 10/07/2024 04:59 AM EST RP Workstation: [id number]   DG Chest 2 View Result Date: 10/03/2024 EXAM: 2 VIEW(S) XRAY OF THE CHEST 10/03/2024 09:55:00 PM COMPARISON: 09/23/2024 CLINICAL HISTORY: shortness of breath FINDINGS: LINES, TUBES AND DEVICES: Stable left chest dual lead cardiac AICD (Automatic Implantable Cardioverter-Defibrillator). LUNGS AND PLEURA: Diffuse increased interstitial markings. Trace pleural effusions. No pneumothorax. HEART AND MEDIASTINUM: Stable cardiomediastinal silhouette. Aortic arch calcifications. BONES AND SOFT TISSUES: Mild thoracic spondylosis. IMPRESSION: 1. Diffuse increased interstitial markings and trace pleural effusions. Electronically signed by: Morgane Naveau MD 10/03/2024 10:02 PM EST RP Workstation: [id number]   DG CHEST PORT 1 VIEW Result Date: 09/23/2024 EXAM: 1 VIEW(S) XRAY OF THE CHEST 09/23/2024 11:58:00 AM COMPARISON: 08/04/2024 CLINICAL HISTORY: Dyspnea FINDINGS: LINES, TUBES AND DEVICES: Left chest AICD in place. LUNGS AND PLEURA: Decreased mild pulmonary edema. Previous interstitial edema has essentially resolved. Mild residual pulmonary venous hypertension. Previous left pleural effusion has resolved. No pneumothorax. HEART AND MEDIASTINUM: Cardiomegaly. Aortic atherosclerosis. No acute abnormality of the mediastinal silhouette. BONES AND SOFT TISSUES: No acute osseous abnormality. IMPRESSION: 1. Decreased mild pulmonary edema with mild residual pulmonary venous hypertension. 2. Previous interstitial pulmonary edema and left pleural effusion have resolved. 3. Cardiomegaly. 4. Aortic atherosclerosis. Electronically signed by: Ryan Salvage MD 09/23/2024 04:38 PM EST RP Workstation: [id number]   CT Renal Stone Study Result Date: 09/20/2024 EXAM: CT UROGRAM 09/20/2024 02:01:23 AM TECHNIQUE: CT of the abdomen and pelvis was performed without the administration of intravenous  contrast as per CT urogram protocol. Multiplanar reformatted images as well as MIP urogram images are provided for review. Automated exposure control, iterative reconstruction, and/or weight based adjustment of the mA/kV was utilized to reduce the radiation dose to as low as reasonably achievable. COMPARISON: None available. CLINICAL HISTORY: Abdominal/flank pain, stone suspected. FINDINGS: LOWER CHEST: Pacemaker leads noted within the right ventricle. Global cardiac size within normal limits. Trace bilateral pleural effusions and interstitial edema are noted within the visualized lung bases, possibly reflecting changes of cardiogenic failure. LIVER: The liver is unremarkable. GALLBLADDER AND BILE DUCTS: Status post cholecystectomy. No biliary ductal dilatation. SPLEEN: No acute abnormality. PANCREAS: No acute abnormality. ADRENAL GLANDS: No acute abnormality. KIDNEYS, URETERS AND BLADDER: Moderate bilateral perinephric stranding, nonspecific. This may be inflammatory in nature, as can be seen with pyelonephritis or glomerulonephritis. No perinephric fluid collections are seen. No hydronephrosis. No intrarenal or ureteral calculi are seen. The bladder is unremarkable. GI AND BOWEL: Stomach demonstrates no acute abnormality. There is no bowel obstruction. PERITONEUM AND RETROPERITONEUM: No ascites. No free air. VASCULATURE: Extensive aortoiliac atherosclerotic calcifications. No aortic aneurysm. LYMPH NODES: No lymphadenopathy. REPRODUCTIVE ORGANS: Tiny bilateral fat-containing inguinal hernias. BONES AND SOFT TISSUES: Osseous structures are age-appropriate. No acute bone abnormality. No lytic or blastic bone lesion. No focal soft tissue abnormality. IMPRESSION: 1. No nephrolithiasis or obstructive uropathy. 2. Moderate bilateral perinephric stranding, nonspecific, possibly inflammatory (e.g., pyelonephritis or glomerulonephritis). Correlation with urinalysis and urine  culture as well as renal function tests may be  helpful for further management. Electronically signed by: Dorethia Molt MD 09/20/2024 02:30 AM EST RP Workstation: [id number]    Microbiology: Results for orders placed or performed during the hospital encounter of 10/03/24  Resp panel by RT-PCR (RSV, Flu A&B, Covid) Anterior Nasal Swab     Status: None   Collection Time: 10/04/24 12:45 PM   Specimen: Anterior Nasal Swab  Result Value Ref Range Status   SARS Coronavirus 2 by RT PCR NEGATIVE NEGATIVE Final   Influenza A by PCR NEGATIVE NEGATIVE Final   Influenza B by PCR NEGATIVE NEGATIVE Final    Comment: (NOTE) The Xpert Xpress SARS-CoV-2/FLU/RSV plus assay is intended as an aid in the diagnosis of influenza from Nasopharyngeal swab specimens and should not be used as a sole basis for treatment. Nasal washings and aspirates are unacceptable for Xpert Xpress SARS-CoV-2/FLU/RSV testing.  Fact Sheet for Patients: bloggercourse.com  Fact Sheet for Healthcare Providers: seriousbroker.it  This test is not yet approved or cleared by the [place]  FDA and has been authorized for detection and/or diagnosis of SARS-CoV-2 by FDA under an Emergency Use Authorization (EUA). This EUA will remain in effect (meaning this test can be used) for the duration of the COVID-19 declaration under Section 564(b)(1) of the Act, 21 U.S.C. section 360bbb-3(b)(1), unless the authorization is terminated or revoked.     Resp Syncytial Virus by PCR NEGATIVE NEGATIVE Final    Comment: (NOTE) Fact Sheet for Patients: bloggercourse.com  Fact Sheet for Healthcare Providers: seriousbroker.it  This test is not yet approved or cleared by the [place]  FDA and has been authorized for detection and/or diagnosis of SARS-CoV-2 by FDA under an Emergency Use Authorization (EUA). This EUA will remain in effect (meaning this test can be used) for the duration of  the COVID-19 declaration under Section 564(b)(1) of the Act, 21 U.S.C. section 360bbb-3(b)(1), unless the authorization is terminated or revoked.  Performed at [facility], [address], [place], [place] [address]     Labs: CBC: Recent Labs  Lab 10/03/24 2142 10/04/24 1251 10/07/24 0319 10/09/24 0311  WBC 10.0 8.9 13.7* 7.8  NEUTROABS  --  6.4  --  4.9  HGB 11.7* 12.6* 12.7* 12.8*  HCT 37.1* 39.7 39.9 38.2*  MCV 90.9 90.4 92.1 87.8  PLT 271 275 236 188   Basic Metabolic Panel: Recent Labs  Lab 10/03/24 2142 10/04/24 1251 10/07/24 0319 10/08/24 0331 10/09/24 0311  NA 139 141 143 142 141  K 4.4 4.5 3.6 3.9 3.7  CL 102 104 105 103 102  CO2 25 21* 25 27 28   GLUCOSE 132* 80 199* 117* 125*  BUN 42* 39* 50* 49* 49*  CREATININE 1.86* 1.57* 2.09* 1.83* 1.73*  CALCIUM  9.3 9.6 9.1 9.1 8.9   Liver Function Tests: Recent Labs  Lab 10/04/24 1251 10/07/24 0319  AST 53* 50*  ALT 65* 73*  ALKPHOS 133* 143*  BILITOT 0.8 0.5  PROT 7.1 6.7  ALBUMIN 4.0 3.9   CBG: Recent Labs  Lab 10/08/24 0623 10/08/24 1209 10/08/24 1619 10/08/24 2031 10/09/24 0540  GLUCAP 125* 164* 133* 181* 117*    Discharge time spent: 40 minutes.  Signed: Deliliah Room, MD [facility] 10/09/2024 "

## 2024-10-09 NOTE — Discharge Instructions (Signed)
 Pedro Kemp

## 2024-10-09 NOTE — Progress Notes (Signed)
 Heart Failure Nurse Navigator Progress Note  PCP: Celestia Rosaline SQUIBB, NP PCP-Cardiologist: [place] Admission Diagnosis: Acute on chronic congestive heart failure.  Admitted from: Home  Presentation:   Pedro Kemp presented with shortness of breath and chest pain. Was re admitted after being discharged from hospital on 10/03/24 ED. Patient reported with the help of Spanish translator that he has been taking his medications since being discharged last week. He stated he had the same problems as before , only worse now. BP 122/89, HR 61, Pro BNP 8,285, Creat 1.86, Troponin 72,   Patient was re-educated on the sign and symptoms of heart failure, daily weights, when to call his doctor or go to the ED. Diet/ fluid restrictions, taking all medications as prescribed and attending all medical appointments. Navigator gave patient another  Spanish version of  Living Better with Heart Failure book. Patient verbalized his understanding of education , a HF TOC appointment was scheduled for 10/17/2024 @ 2 pm.   ECHO/ LVEF: 25-30%   Clinical Course:  Past Medical History:  Diagnosis Date   Cardiac arrest (HCC)    CHF (congestive heart failure) (HCC)    Coronary artery disease    Diabetes mellitus without complication (HCC)    H/O heart artery stent    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  Tobacco Use   Smoking status: Never    Passive exposure: Never   Smokeless tobacco: Never  Vaping Use   Vaping status: Never Used  Substance and Sexual Activity   Alcohol use: Never   Drug use: Never   Sexual activity: Not Currently    Partners: Female  Other Topics Concern   Not on file  Social History Narrative   Not  on file   Social Drivers of Health   Tobacco Use: Low Risk (10/07/2024)   Patient History    Smoking Tobacco Use: Never    Smokeless Tobacco Use: Never    Passive Exposure: Never  Financial Resource Strain: High Risk (08/25/2023)   Overall Financial Resource Strain (CARDIA)    Difficulty of Paying Living Expenses: Hard  Food Insecurity: Food Insecurity Present (10/07/2024)   Epic    Worried About Programme Researcher, Broadcasting/film/video in the Last Year: Sometimes true    Ran Out of Food in the Last Year: Sometimes true  Transportation Needs: Unmet Transportation Needs (10/07/2024)   Epic    Lack of Transportation (Medical): Yes    Lack of Transportation (Non-Medical): Yes  Physical Activity: Not on file  Stress: No Stress Concern Present (09/28/2024)   [employer] of Occupational Health - Occupational Stress Questionnaire    Feeling of Stress: Only a little  Social Connections: Moderately Isolated (10/07/2024)   Social Connection and Isolation Panel    Frequency of Communication with Friends and Family: More than three times a week    Frequency of Social Gatherings with Friends and Family: More than three times a week    Attends Religious Services: 1 to 4 times per year    Active Member of [employer] or Organizations: No    Attends Banker Meetings: Never    Marital Status: Widowed  Depression (PHQ2-9):  Low Risk (09/28/2024)   Depression (PHQ2-9)    PHQ-2 Score: 1  Alcohol Screen: Low Risk (08/25/2023)   Alcohol Screen    Last Alcohol Screening Score (AUDIT): 0  Housing: Unknown (10/07/2024)   Epic    Unable to Pay for Housing in the Last Year: Not on file    Number of Times Moved in the Last Year: 0    Homeless in the Last Year: Not on file  Recent Concern: Housing - High Risk (10/07/2024)   Epic    Unable to Pay for Housing in the Last Year: Yes    Number of Times Moved in the Last Year: Not on file    Homeless in the Last Year: No  Utilities: Not At Risk (10/07/2024)    Epic    Threatened with loss of utilities: No  Health Literacy: Not on file   Education Assessment and Provision:  Detailed education and instructions provided on heart failure disease management including the following:  Signs and symptoms of Heart Failure When to call the physician Importance of daily weights Low sodium diet Fluid restriction Medication management Anticipated future follow-up appointments  Patient education given on each of the above topics.  Patient acknowledges understanding via teach back method and acceptance of all instructions.  Education Materials:  Living Better With Heart Failure Booklet, HF zone tool, & Daily Weight Tracker Tool.  Patient has scale at home: Yes Patient has pill box at home: NA    High Risk Criteria for Readmission and/or Poor Patient Outcomes: Heart failure hospital admissions (last 6 months): 5  No Show rate: 21% Difficult social situation: No Demonstrates medication adherence: Yes Primary Language: Spanish Literacy level: Reading/ writing in Spanish  Barriers of Care:   Multiple HF admissions/ appointment compliance Diet/ fluid restrictions ( salty foods) Daily weights  Considerations/Referrals:   Referral made to Heart Failure Pharmacist Stewardship: NA Referral made to Heart Failure CSW/NCM TOC: NA Referral made to Heart & Vascular TOC clinic: Yes, 10/17/24 @ 2 pm  ( Has had HF TOC No Shows on 01/24/24, 10/02/2024)   Items for Follow-up on DC/TOC: Appointment compliance Diet/ fluid restrictions/ daily weights   Stephane Haddock, BSN, RN Heart Failure Teacher, Adult Education Only

## 2024-10-09 NOTE — TOC Transition Note (Addendum)
 Transition of Care [facility]) - Discharge Note   Patient Details  Name: Pedro Kemp MRN: [id number] Date of Birth: 1956/10/17  Transition of Care [facility] CM/SW Contact:  Waddell Barnie Rama, RN Phone Number: 10/09/2024, 12:17 PM   Clinical Narrative:    For dc, he will need a cab voucher in dc lounge at dc.  Pharmacy going over information with patient that speaks Spanish on his xarelto  medication.  Pharmacy getting samples for patient so that he can dc today. Pharmacist was only able to get a 14 day sample, J [employer] will call him on his phone to get the enrollment started.  He has apt with HF clinic on 12/30 if he is till having issues , they will asist him with some eliquis  per the pharmacist Maurilio.         Patient Goals and CMS Choice            Discharge Placement                       Discharge Plan and Services Additional resources added to the After Visit Summary for                                       Social Drivers of Health (SDOH) Interventions SDOH Screenings   Food Insecurity: Food Insecurity Present (10/07/2024)  Housing: Unknown (10/07/2024)  Recent Concern: Housing - High Risk (10/07/2024)  Transportation Needs: Unmet Transportation Needs (10/07/2024)  Utilities: Not At Risk (10/07/2024)  Alcohol Screen: Low Risk (08/25/2023)  Depression (PHQ2-9): Low Risk (09/28/2024)  Financial Resource Strain: High Risk (08/25/2023)  Social Connections: Moderately Isolated (10/07/2024)  Stress: No Stress Concern Present (09/28/2024)  Tobacco Use: Low Risk (10/07/2024)     Readmission Risk Interventions    10/09/2024   11:33 AM 09/21/2024    2:48 PM 08/26/2023   12:29 PM  Readmission Risk Prevention Plan  Transportation Screening Complete Complete Complete  PCP or Specialist Appt within 5-7 Days  Complete Not Complete  Not Complete comments   follow up apt 11/18  PCP or Specialist Appt within 3-5 Days Complete    Home Care Screening   Complete Complete  Medication Review (RN CM)  Complete Complete  HRI or Home Care Consult Complete    Palliative Care Screening Not Applicable    Medication Review (RN Care Manager) Complete

## 2024-10-09 NOTE — Telephone Encounter (Signed)
 Xarelto  15mg  samples provided.

## 2024-10-09 NOTE — TOC CM/SW Note (Addendum)
 Transition of Care [facility]) - Inpatient Brief Assessment   Patient Details  Name: Pedro Kemp MRN: [id number] Date of Birth: 02-Aug-1956  Transition of Care [facility]) CM/SW Contact:    Waddell Barnie Rama, RN Phone Number: 10/09/2024, 11:34 AM   Clinical Narrative: NCM used Spanish Video interpreter [phone] From home with [place] has PCP and no insurance on file, states has no HH services in place at this time or DME at home.  States he will need a cab to transport them home at [employer] and family is support system,  He gets his medications from [facility] clinic.  Pta self ambulatory.  He has a follow up apt in Jan on AVS, he does not have any funds to pay for medications.  Patient has farxiga  at home, but he is not sure if he has the xarelto ,  Maurilio the pharmacist is working with patient with Spanish interpreter, looks like his patient asst was approved but he has not received the xarelto  meds yet.  Emma the pharmacist is going over information with patient on how to take and when to take xarelto  , and working on getting him some samples.    Transition of Care Asessment: Insurance and Status: Insurance coverage has been reviewed Patient has primary care physician: Yes Home environment has been reviewed: home with nephew Prior level of function:: indep Prior/Current Home Services: No current home services Social Drivers of Health Review: SDOH reviewed no interventions necessary Readmission risk has been reviewed: Yes Transition of care needs: transition of care needs identified, TOC will continue to follow

## 2024-10-10 ENCOUNTER — Telehealth: Payer: Self-pay | Admitting: Licensed Clinical Social Worker

## 2024-10-10 ENCOUNTER — Telehealth: Payer: Self-pay

## 2024-10-10 ENCOUNTER — Telehealth: Payer: Self-pay | Admitting: *Deleted

## 2024-10-10 ENCOUNTER — Other Ambulatory Visit: Payer: Self-pay

## 2024-10-10 NOTE — Telephone Encounter (Signed)
" °  Patient is approved through July next year and can request shipment at any time at [phone] per J[employer]J approval letter (see chart media). We are unable to initiate the initial shipment. The request has to come from patient/POI. Please advise patient to contact [employer] directly at [phone] to request their shipment.  "

## 2024-10-10 NOTE — Telephone Encounter (Addendum)
 LCSW left pt VM with translator providing shipping information to patient again, 3rd attempt.

## 2024-10-10 NOTE — Transitions of Care (Post Inpatient/ED Visit) (Signed)
" ° °  10/10/2024  Name: Pedro Kemp MRN: [id number] DOB: [birth year]  Today's TOC FU Call Status: Today's TOC FU Call Status:: Unsuccessful Call (1st Attempt) Unsuccessful Call (1st Attempt) Date: 10/10/24  Attempted to reach the patient regarding the most recent Inpatient/ED visit.  Call made while utilizing Spanish Interpreter # [phone], via [employer].  Follow Up Plan: Additional outreach attempts will be made to reach the patient to complete the Transitions of Care (Post Inpatient/ED visit) call.   Andrea Dimes RN, BSN [place]  [facility] RN Care Manager [phone]  "

## 2024-10-10 NOTE — Telephone Encounter (Addendum)
 H&V Care Navigation CSW Progress Note  Clinical Social Worker contacted patient by phone to f/u on J[employer]J assistance for Xarelto . Pt needs to call company to ship his medication, staff are not able to do so must be consumer. Attempted him at [phone] with Spanish language interpreter [place] [phone]. No answer, left voicemail with appropriate number for him to call. Will also make note on TOC appt that staff need to help him call for delivery.  Patient is participating in a Managed Medicaid Plan:  No, self pay only  SDOH Screenings   Food Insecurity: Food Insecurity Present (10/07/2024)  Housing: Unknown (10/07/2024)  Recent Concern: Housing - High Risk (10/07/2024)  Transportation Needs: Unmet Transportation Needs (10/07/2024)  Utilities: Not At Risk (10/07/2024)  Alcohol Screen: Low Risk (08/25/2023)  Depression (PHQ2-9): Low Risk (09/28/2024)  Financial Resource Strain: High Risk (08/25/2023)  Social Connections: Moderately Isolated (10/07/2024)  Stress: No Stress Concern Present (09/28/2024)  Tobacco Use: Low Risk (10/07/2024)    Marit Lark, MSW, LCSW Clinical Social Worker II [facility] Heart/Vascular Care Navigation  [phone]- work cell phone (preferred)

## 2024-10-10 NOTE — Telephone Encounter (Signed)
-----   Message from Camelia JONETTA Slade sent at 10/10/2024 11:05 AM EST ----- Walterine Krabbe -   He has a follow-up appt on 12/30 in the [facility] clinic, but he never answered when [employer] / [employer] called to enroll him in the Xarelto  mail order (was approved for PAP 04/2024 through 05/13/25).  I gave him 14 tabs (the last of our Xarelto  sample supply since it went generic) to tide him over until the new year but we will need to re-submit his paperwork again.    He's spanish speaking so I think there was a language barrier but I entered the Xarelto  PAP phone number in his cell and emphasized the importance of answering when they call.   Thanks for your help and reach out with any questions!  Maurilio

## 2024-10-13 ENCOUNTER — Other Ambulatory Visit: Payer: Self-pay

## 2024-10-17 ENCOUNTER — Telehealth (HOSPITAL_COMMUNITY): Payer: Self-pay | Admitting: Physician Assistant

## 2024-10-17 ENCOUNTER — Ambulatory Visit (HOSPITAL_COMMUNITY): Payer: Self-pay

## 2024-10-17 NOTE — Progress Notes (Incomplete)
 "   HEART & VASCULAR TRANSITION OF CARE  Follow Up     Referring Physician: Dr. Bryn  Primary Care: Celestia Rosaline SQUIBB, NP  Primary Cardiologist: Jerel Balding, MD   HPI: 68 y.o. spanish speaking male with a history of CAD s/p mid LAD shockwave and PCI, diagonal PCI July 2022, VT arrest s/p ICD, HFrEF (EF 30-35%), and poorly controlled T2DM. Received prior cardiac care at [facility] in [place]. Recently transferred care to [facility] in 11/23, now followed by Dr. Balding.   Admitted 1/24 w/ a/c CHF. Pt reported poor compliance w/ meds. Unable to afford due to cost. Echo EF 30-35%, mild-mod MR, RV normal. He was referred to [facility] and seen for 2 visits. GDMT was adjusted and he was instructed to return back to gen cards for ongoing care.   He was readmitted 11/24 for respiratory distress/hypoxia. Hs trops were elevated, peaking at 5000. Started on abx for possible PNA. Echo showed EF 30-35%, RV normal. Mod MR. R/LHC demonstrated patent mid LAD and diagonal stents. High-grade ostial diagonal disease. High-grade mid left circumflex disease (very small vessel and feeds a subtotally occluded obtuse marginal). Moderate diffuse disease of the right coronary artery with 80% distal RCA. No interventional targets/culprit lesions found. RHC showed mildy elevated filling pressures and marginal output.   Admitted in March, June and October 2025 w/ acute on chronicCHF in setting of medication noncompliance. Readmitted 12/25 with AKI on CKD IIIa and acute on chronic CHF. There was initial concern for possible pyelonephritis vs GN. Perinephritic stranding noted on imaging. He was treated empirically with antibiotics. Immune serologies were negative. AKI later felt to be more likely cardiorenal. He was diuresed, ARB and SGLT2i held. Readmitted on 12/20 with volume overload. Reported taking medications including diuretics. Discharged home on lasix  40 BID on 12/22.  He is here today for post hospital CHF  follow-up.       Past Medical History:  Diagnosis Date   Cardiac arrest [facility])    CHF (congestive heart failure) (HCC)    Coronary artery disease    Diabetes mellitus without complication (HCC)    H/O heart artery stent    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    Current Outpatient Medications  Medication Sig Dispense Refill   atorvastatin  (LIPITOR ) 80 MG tablet Take 1 tablet (80 mg total) by mouth daily. 90 tablet 3   dapagliflozin  propanediol (FARXIGA ) 10 MG TABS tablet Take 1 tablet (10 mg total) by mouth daily before breakfast. 30 tablet 0   furosemide  (LASIX ) 40 MG tablet Take 1 tablet (40 mg total) by mouth 2 (two) times daily. Tomar una tableta (40 mg) dos veces al dia. En caso de aumento the [place] 2 libras en un dia, o 5 libras en [place] worldwide, tomar ona tableta y media, dos veces al dia, hasta que el peso vuelva a la normalidad. 60 tablet 1   losartan  (COZAAR ) 25 MG tablet Take 25 mg by mouth daily.     metoprolol  succinate (TOPROL -XL) 25 MG 24 hr tablet Take 1 tablet (25 mg total) by mouth daily. 90 tablet 3   pantoprazole  (PROTONIX ) 40 MG tablet Take 1 tablet (40 mg total) by mouth daily. 10 tablet 0   Rivaroxaban  (XARELTO ) 15 MG TABS tablet Take 1 tablet (15 mg total) by mouth daily. 30 tablet 0   No current facility-administered medications for this visit.    No Known Allergies    Social History  Socioeconomic History   Marital status: Single    Spouse name: Not on file   Number of children: 1   Years of education: Not on file   Highest education level: 5th grade  Occupational History   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  Tobacco Use   Smoking status: Never    Passive exposure: Never   Smokeless tobacco: Never  Vaping Use   Vaping status: Never Used  Substance and Sexual Activity   Alcohol use: Never   Drug use: Never   Sexual activity: Not Currently    Partners: Female  Other Topics Concern   Not on file  Social History  Narrative   Not on file   Social Drivers of Health   Tobacco Use: Low Risk (10/07/2024)   Patient History    Smoking Tobacco Use: Never    Smokeless Tobacco Use: Never    Passive Exposure: Never  Financial Resource Strain: High Risk (08/25/2023)   Overall Financial Resource Strain (CARDIA)    Difficulty of Paying Living Expenses: Hard  Food Insecurity: Food Insecurity Present (10/07/2024)   Epic    Worried About Programme Researcher, Broadcasting/film/video in the Last Year: Sometimes true    Ran Out of Food in the Last Year: Sometimes true  Transportation Needs: Unmet Transportation Needs (10/07/2024)   Epic    Lack of Transportation (Medical): Yes    Lack of Transportation (Non-Medical): Yes  Physical Activity: Not on file  Stress: No Stress Concern Present (09/28/2024)   [employer] of Occupational Health - Occupational Stress Questionnaire    Feeling of Stress: Only a little  Social Connections: Moderately Isolated (10/07/2024)   Social Connection and Isolation Panel    Frequency of Communication with Friends and Family: More than three times a week    Frequency of Social Gatherings with Friends and Family: More than three times a week    Attends Religious Services: 1 to 4 times per year    Active Member of [employer] or Organizations: No    Attends Banker Meetings: Never    Marital Status: Widowed  Intimate Partner Violence: Not At Risk (10/07/2024)   Epic    Fear of Current or Ex-Partner: No    Emotionally Abused: No    Physically Abused: No    Sexually Abused: No  Depression (PHQ2-9): Low Risk (09/28/2024)   Depression (PHQ2-9)    PHQ-2 Score: 1  Alcohol Screen: Low Risk (08/25/2023)   Alcohol Screen    Last Alcohol Screening Score (AUDIT): 0  Housing: Unknown (10/07/2024)   Epic    Unable to Pay for Housing in the Last Year: Not on file    Number of Times Moved in the Last Year: 0    Homeless in the Last Year: Not on file  Recent Concern: Housing - High Risk (10/07/2024)    Epic    Unable to Pay for Housing in the Last Year: Yes    Number of Times Moved in the Last Year: Not on file    Homeless in the Last Year: No  Utilities: Not At Risk (10/07/2024)   Epic    Threatened with loss of utilities: No  Health Literacy: Not on file      Family History  Problem Relation Age of Onset   Diabetes Mother    Diabetes Brother     There were no vitals filed for this visit.   This SmartLink has not been configured with any valid records.   There were no vitals filed  for this visit.    PHYSICAL EXAM: ReDs 39%  General:  Well appearing. No respiratory difficulty HEENT: normal Neck: supple. no JVD. Carotids 2+ bilat; no bruits. No lymphadenopathy or thyromegaly appreciated. Cor: PMI nondisplaced. Regular rate & rhythm. No rubs, gallops or murmurs. Lungs: clear Abdomen: soft, nontender, nondistended. No hepatosplenomegaly. No bruits or masses. Good bowel sounds. Extremities: no cyanosis, clubbing, rash, edema Neuro: alert & oriented x 3, cranial nerves grossly intact. moves all 4 extremities w/o difficulty. Affect pleasant.    ECG: Not performed   ASSESSMENT & PLAN:  1. Chronic Systolic Heart Failure - ICM, - Has ICD - Echo 1/24 EF 30-35%, RV ok - Echo 11/24 EF 30-35%, RV ok  - Limited echo 10/25: EF 25-30%, grade II DD, RV okay - NYHA Class I-II - Continue Lasix  40 mg bid - Continue farxiga  10 mg daily - Failed spiro and Entresto  due to hyperkalemia. Retrial entresto  *** - Continue Toprol  XL 25 mg daily - He would benefit from paramedicine but not [facility]   2. CAD - h/o NSTEMI s/p PCI to LAD and diagonal at [facility]  - [facility] 11/24 w/ patent stents, high-grade ostial diag disease, high-grade m LCX disease (very small vessel and feeds a subtotally occluded OM, moderate diffuse RCA with 80% d RCA. - denies CP  - continue asa, statin   3. H/o VT Arrest - s/p ICD, implanted at [facility]  - Dr. Francyne now follows  4. Hypertension  5. PAF -  Continue Xarelto    Referred to HFSW (PCP, Medications, Transportation, ETOH Abuse, Drug Abuse, Insurance, Financial ): Yes  Refer to Pharmacy: Yes  Refer to Home Health: No Refer to Advanced Heart Failure Clinic: No  Refer to General Cardiology: Yes   Follow up  f/u   [facility], Treyshawn Muldrew N, PA-C 10/17/2024  "

## 2024-10-25 ENCOUNTER — Encounter: Payer: Self-pay | Admitting: Cardiovascular Disease

## 2024-10-27 ENCOUNTER — Telehealth (INDEPENDENT_AMBULATORY_CARE_PROVIDER_SITE_OTHER): Payer: Self-pay | Admitting: Primary Care

## 2024-10-27 NOTE — Telephone Encounter (Signed)
 Spoke to pt about upcoming appt.. Will be present

## 2024-10-30 ENCOUNTER — Encounter (INDEPENDENT_AMBULATORY_CARE_PROVIDER_SITE_OTHER): Payer: Self-pay | Admitting: Primary Care

## 2024-10-30 ENCOUNTER — Ambulatory Visit (INDEPENDENT_AMBULATORY_CARE_PROVIDER_SITE_OTHER): Payer: Self-pay | Admitting: Primary Care

## 2024-10-30 VITALS — BP 120/76 | HR 69 | Resp 16 | Ht 63.5 in | Wt 142.0 lb

## 2024-10-30 DIAGNOSIS — Z09 Encounter for follow-up examination after completed treatment for conditions other than malignant neoplasm: Secondary | ICD-10-CM

## 2024-10-30 DIAGNOSIS — I5020 Unspecified systolic (congestive) heart failure: Secondary | ICD-10-CM

## 2024-10-30 DIAGNOSIS — Z758 Other problems related to medical facilities and other health care: Secondary | ICD-10-CM

## 2024-10-30 DIAGNOSIS — E1165 Type 2 diabetes mellitus with hyperglycemia: Secondary | ICD-10-CM

## 2024-10-30 DIAGNOSIS — I10 Essential (primary) hypertension: Secondary | ICD-10-CM

## 2024-10-30 DIAGNOSIS — Z603 Acculturation difficulty: Secondary | ICD-10-CM

## 2024-10-30 DIAGNOSIS — Z794 Long term (current) use of insulin: Secondary | ICD-10-CM

## 2024-10-30 NOTE — Patient Instructions (Signed)
 [address]  [place], [place]  [address]  [address], [place], [place]     8:30am -4:30pm    [phone]

## 2024-10-30 NOTE — Progress Notes (Signed)
 "  Subjective:   Pedro Kemp is a 69 y.o. Hispanic male (interpreter Pedro Kemp) presents for hospital follow up and establish care. Admit date to the hospital was 10/07/24, patient was discharged from the hospital on 10/09/24, patient was admitted for: Acute on chronic congestive heart failure, unspecified heart failure type . Leukocytosis, Paroxysmal atrial fibrillation (HCC), Acute kidney injury superimposed on chronic kidney disease, HFrEF (heart failure with reduced ejection fraction) (HCC) Elevated troponin, Uncontrolled type 2 diabetes mellitus with hyperglycemia, with long-term current use of insulin  (HCC), Normocytic anemia, Elevated liver enzymes And GERD . Schedule CHF/hospitalization12/30/25-no show. Follow up to schedule cardiology.  schedule appt on 11/18/23 at 140 pm will be on AVS. Today doing well. Pedro Kemp Financial aid packet given to him and address for counselor.Medication has 1 refill that will last to his appt with cardiology. Past Medical History:  Diagnosis Date   Cardiac arrest [facility])    CHF (congestive heart failure) (HCC)    Coronary artery disease    Diabetes mellitus without complication (HCC)    H/O heart artery stent    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     Allergies[1]  Medications Ordered Prior to Encounter[2]  Review of System: ROS Comprehensive ROS Pertinent positive and negative noted in HPI   Objective:  BP 120/76   Pulse 69   Resp 16   Ht 5' 3.5 (1.613 m)   Wt 142 lb (64.4 kg)   SpO2 98%   BMI 24.76 kg/m     Physical Exam Vitals reviewed.  Constitutional:      Appearance: Normal appearance. He is normal weight.  HENT:     Head: Normocephalic.     Right Ear: Tympanic membrane, ear canal and external ear normal.     Left Ear: Tympanic membrane, ear canal and external ear normal.     Nose: Nose normal.  Eyes:     Extraocular Movements: Extraocular movements intact.     Pupils: Pupils are equal,  round, and reactive to light.  Cardiovascular:     Rate and Rhythm: Normal rate and regular rhythm.  Pulmonary:     Effort: Pulmonary effort is normal.     Breath sounds: Normal breath sounds.  Abdominal:     General: Bowel sounds are normal.     Palpations: Abdomen is soft.  Musculoskeletal:        General: Normal range of motion.  Skin:    General: Skin is warm and dry.  Neurological:     Mental Status: He is alert and oriented to person, place, and time.  Psychiatric:        Mood and Affect: Mood normal.        Behavior: Behavior normal.        Thought Content: Thought content normal.        Judgment: Judgment normal.      Assessment:  Pedro Kemp was seen today for hospitalization follow-up.  Diagnoses and all orders for this visit:  Hospital discharge follow-up See HPI   Language barrier  Uncontrolled type 2 diabetes mellitus with hyperglycemia, with long-term current use of insulin  (HCC) A1C 8.4 previously 7.5  You are prediabetic .  monitor carbohydrates -rice, potatoes, tortillas, [place] Corn Masa Flour, breads, pasta, sweets, sodas.  Increase exercising to help maintain appropriate weight.   Complications from uncontrolled diabetes -diabetic retinopathy leading to blindness, diabetic nephropathy leading to dialysis, decrease in circulation decrease in sores or wound healing which may lead  to amputations and increase of heart attack and stroke . Effects on the liver- non-alcoholic fatty liver disease (NAFLD), which can progress to non-alcoholic steatohepatitis (NASH), cirrhosis, liver failure, and even liver cancer , lead to fat accumulation in the liver, causing NAFLD, and if left untreated, can lead to more severe liver damage.  Refer to Clinical pharmacist only on Farxiga     HFrEF (heart failure with reduced ejection fraction) (HCC) 2/2 Essential hypertension Manage by cardiology    This note has been created with Dragon speech recognition software and proofreader. Any transcriptional errors are unintentional.   No follow-ups on file.  Pedro SHAUNNA Bohr, NP 10/30/2024, 3:12 PM        [1] No Known Allergies [2]  Current Outpatient Medications on File Prior to Visit  Medication Sig Dispense Refill   atorvastatin  (LIPITOR ) 80 MG tablet Take 1 tablet (80 mg total) by mouth daily. 90 tablet 3   dapagliflozin  propanediol (FARXIGA ) 10 MG TABS tablet Take 1 tablet (10 mg total) by mouth daily before breakfast. 30 tablet 0   furosemide  (LASIX ) 40 MG tablet Take 1 tablet (40 mg total) by mouth 2 (two) times daily. Tomar una tableta (40 mg) dos veces al dia. En caso de aumento the [place] 2 libras en un dia, o 5 libras en c[employer] [place] worldwide, tomar ona tableta y media, dos veces al dia, hasta que el peso vuelva a la normalidad. 60 tablet 1   losartan  (COZAAR ) 25 MG tablet Take 25 mg by mouth daily.     metoprolol  succinate (TOPROL -XL) 25 MG 24 hr tablet Take 1 tablet (25 mg total) by mouth daily. 90 tablet 3   pantoprazole  (PROTONIX ) 40 MG tablet Take 1 tablet (40 mg total) by mouth daily. 10 tablet 0   Rivaroxaban  (XARELTO ) 15 MG TABS tablet Take 1 tablet (15 mg total) by mouth daily. 30 tablet 0   No current facility-administered medications on file prior to visit.   "

## 2024-11-02 ENCOUNTER — Other Ambulatory Visit: Payer: Self-pay

## 2024-11-03 ENCOUNTER — Other Ambulatory Visit: Payer: Self-pay

## 2024-11-03 ENCOUNTER — Telehealth: Payer: Self-pay | Admitting: Cardiovascular Disease

## 2024-11-03 ENCOUNTER — Other Ambulatory Visit (INDEPENDENT_AMBULATORY_CARE_PROVIDER_SITE_OTHER): Payer: Self-pay | Admitting: Primary Care

## 2024-11-03 NOTE — Telephone Encounter (Signed)
 Will forward to provider

## 2024-11-03 NOTE — Telephone Encounter (Signed)
 Requested medications are due for refill today.  yes  Requested medications are on the active medications list.  yes  Last refill. 10/09/2024 #10 0 rf  Future visit scheduled.   yes  Notes to clinic.  Rx signed by Dr. Dino    Requested Prescriptions  Pending Prescriptions Disp Refills   pantoprazole  (PROTONIX ) 40 MG tablet 10 tablet 0    Sig: Take 1 tablet (40 mg total) by mouth daily.     Gastroenterology: Proton Pump Inhibitors Passed - 11/03/2024  1:24 PM      Passed - Valid encounter within last 12 months    Recent Outpatient Visits           4 days ago Hospital discharge follow-up   [place] [facility] Celestia Rosaline SQUIBB, NP   1 month ago Hospital discharge follow-up   [facility], Amy J, NP   1 year ago Type 2 diabetes mellitus without complication, without long-term current use of insulin  [facility])   [place] [facility] Celestia Rosaline SQUIBB, NP   2 years ago Type 2 diabetes mellitus without complication, without long-term current use of insulin  [facility])   [place] [facility] Celestia Rosaline SQUIBB, NP   2 years ago Type 2 diabetes mellitus without complication, without long-term current use of insulin  [facility])   [place] [facility] Celestia Rosaline SQUIBB, NP

## 2024-11-03 NOTE — Telephone Encounter (Signed)
" °*  STAT* If patient is at the pharmacy, call can be transferred to refill team.   1. Which medications need to be refilled? (please list name of each medication and dose if known)   furosemide  (LASIX ) 40 MG tablet     2. Would you like to learn more about the convenience, safety, & potential cost savings by using the [facility]?    3. Are you open to using the [facility] (Type [facility].    4. Which pharmacy/location (including street and city if local pharmacy) is medication to be sent to?  [facility]    5. Do they need a 30 day or 90 day supply? 90   Patient is out of meds  "

## 2024-11-08 ENCOUNTER — Other Ambulatory Visit: Payer: Self-pay

## 2024-11-08 MED ORDER — FUROSEMIDE 40 MG PO TABS
40.0000 mg | ORAL_TABLET | Freq: Two times a day (BID) | ORAL | 2 refills | Status: AC
  Filled 2024-11-08: qty 180, 90d supply, fill #0

## 2024-11-08 NOTE — Telephone Encounter (Signed)
 Pt's medication was sent to pt's pharmacy as requested. Confirmation received.

## 2024-11-17 ENCOUNTER — Ambulatory Visit: Payer: Self-pay

## 2024-11-17 DIAGNOSIS — I255 Ischemic cardiomyopathy: Secondary | ICD-10-CM

## 2024-11-20 LAB — CUP PACEART REMOTE DEVICE CHECK
Battery Remaining Longevity: 98 mo
Battery Voltage: 2.99 V
Brady Statistic RV Percent Paced: 0.06 %
Date Time Interrogation Session: 20260130005307
HighPow Impedance: 53 Ohm
Implantable Lead Connection Status: 753985
Implantable Lead Connection Status: 753985
Implantable Lead Implant Date: 20220220
Implantable Lead Implant Date: 20220220
Implantable Lead Location: 753859
Implantable Lead Location: 753860
Implantable Lead Model: 5076
Implantable Pulse Generator Implant Date: 20220220
Lead Channel Impedance Value: 266 Ohm
Lead Channel Impedance Value: 342 Ohm
Lead Channel Impedance Value: 380 Ohm
Lead Channel Pacing Threshold Amplitude: 0.5 V
Lead Channel Pacing Threshold Amplitude: 0.5 V
Lead Channel Pacing Threshold Pulse Width: 0.4 ms
Lead Channel Pacing Threshold Pulse Width: 0.4 ms
Lead Channel Sensing Intrinsic Amplitude: 18.4 mV
Lead Channel Sensing Intrinsic Amplitude: 2.3 mV
Lead Channel Setting Pacing Amplitude: 1.5 V
Lead Channel Setting Pacing Amplitude: 2 V
Lead Channel Setting Pacing Pulse Width: 0.4 ms
Lead Channel Setting Sensing Sensitivity: 0.45 mV
Zone Setting Status: 755011
Zone Setting Status: 755011
Zone Setting Status: 755011

## 2024-11-23 ENCOUNTER — Ambulatory Visit: Payer: Self-pay | Admitting: Cardiovascular Disease

## 2024-11-24 NOTE — Progress Notes (Signed)
 Remote ICD Transmission

## 2025-01-29 ENCOUNTER — Ambulatory Visit (INDEPENDENT_AMBULATORY_CARE_PROVIDER_SITE_OTHER): Payer: Self-pay | Admitting: Primary Care

## 2025-02-16 ENCOUNTER — Ambulatory Visit: Payer: Self-pay

## 2025-05-18 ENCOUNTER — Ambulatory Visit: Payer: Self-pay
